# Patient Record
Sex: Female | Born: 1965 | Race: Black or African American | Hispanic: No | Marital: Single | Smoking: Never smoker
Health system: Southern US, Community
[De-identification: ages and names within clinical notes are randomized; demographics above are authoritative.]

## PROBLEM LIST (undated history)

## (undated) DIAGNOSIS — F32A Depression, unspecified: Secondary | ICD-10-CM

## (undated) DIAGNOSIS — I10 Essential (primary) hypertension: Secondary | ICD-10-CM

## (undated) DIAGNOSIS — R450 Nervousness: Secondary | ICD-10-CM

## (undated) DIAGNOSIS — G479 Sleep disorder, unspecified: Secondary | ICD-10-CM

## (undated) DIAGNOSIS — F439 Reaction to severe stress, unspecified: Secondary | ICD-10-CM

## (undated) DIAGNOSIS — D219 Benign neoplasm of connective and other soft tissue, unspecified: Secondary | ICD-10-CM

## (undated) DIAGNOSIS — R682 Dry mouth, unspecified: Secondary | ICD-10-CM

## (undated) DIAGNOSIS — E669 Obesity, unspecified: Secondary | ICD-10-CM

## (undated) DIAGNOSIS — R7303 Prediabetes: Secondary | ICD-10-CM

## (undated) DIAGNOSIS — E876 Hypokalemia: Secondary | ICD-10-CM

## (undated) DIAGNOSIS — F419 Anxiety disorder, unspecified: Secondary | ICD-10-CM

## (undated) DIAGNOSIS — M7989 Other specified soft tissue disorders: Secondary | ICD-10-CM

## (undated) DIAGNOSIS — K829 Disease of gallbladder, unspecified: Secondary | ICD-10-CM

## (undated) DIAGNOSIS — F329 Major depressive disorder, single episode, unspecified: Secondary | ICD-10-CM

## (undated) DIAGNOSIS — L0390 Cellulitis, unspecified: Secondary | ICD-10-CM

## (undated) DIAGNOSIS — E7800 Pure hypercholesterolemia, unspecified: Secondary | ICD-10-CM

## (undated) HISTORY — DX: Essential (primary) hypertension: I10

## (undated) HISTORY — PX: CHOLECYSTECTOMY: SHX55

## (undated) HISTORY — DX: Nervousness: R45.0

## (undated) HISTORY — DX: Obesity, unspecified: E66.9

## (undated) HISTORY — DX: Other specified soft tissue disorders: M79.89

## (undated) HISTORY — DX: Dry mouth, unspecified: R68.2

## (undated) HISTORY — DX: Hypokalemia: E87.6

## (undated) HISTORY — DX: Pure hypercholesterolemia, unspecified: E78.00

## (undated) HISTORY — DX: Sleep disorder, unspecified: G47.9

## (undated) HISTORY — DX: Disease of gallbladder, unspecified: K82.9

## (undated) HISTORY — PX: GANGLION CYST EXCISION: SHX1691

## (undated) HISTORY — DX: Reaction to severe stress, unspecified: F43.9

## (undated) HISTORY — PX: OTHER SURGICAL HISTORY: SHX169

## (undated) HISTORY — PX: WISDOM TOOTH EXTRACTION: SHX21

## (undated) HISTORY — DX: Benign neoplasm of connective and other soft tissue, unspecified: D21.9

---

## 1998-12-17 ENCOUNTER — Other Ambulatory Visit: Admission: RE | Admit: 1998-12-17 | Discharge: 1998-12-17 | Payer: Self-pay | Admitting: Obstetrics and Gynecology

## 2000-10-20 ENCOUNTER — Other Ambulatory Visit: Admission: RE | Admit: 2000-10-20 | Discharge: 2000-10-20 | Payer: Self-pay | Admitting: Obstetrics and Gynecology

## 2001-11-15 ENCOUNTER — Other Ambulatory Visit: Admission: RE | Admit: 2001-11-15 | Discharge: 2001-11-15 | Payer: Self-pay | Admitting: Obstetrics & Gynecology

## 2003-08-08 ENCOUNTER — Other Ambulatory Visit: Admission: RE | Admit: 2003-08-08 | Discharge: 2003-08-08 | Payer: Self-pay | Admitting: Obstetrics and Gynecology

## 2004-09-02 ENCOUNTER — Other Ambulatory Visit: Admission: RE | Admit: 2004-09-02 | Discharge: 2004-09-02 | Payer: Self-pay | Admitting: Obstetrics and Gynecology

## 2005-03-10 DIAGNOSIS — I10 Essential (primary) hypertension: Secondary | ICD-10-CM

## 2005-03-10 DIAGNOSIS — E876 Hypokalemia: Secondary | ICD-10-CM

## 2005-03-10 HISTORY — DX: Hypokalemia: E87.6

## 2005-03-10 HISTORY — DX: Essential (primary) hypertension: I10

## 2005-09-02 ENCOUNTER — Encounter (INDEPENDENT_AMBULATORY_CARE_PROVIDER_SITE_OTHER): Payer: Self-pay | Admitting: *Deleted

## 2005-09-29 ENCOUNTER — Ambulatory Visit: Payer: Self-pay | Admitting: Internal Medicine

## 2005-10-10 ENCOUNTER — Ambulatory Visit: Payer: Self-pay | Admitting: Internal Medicine

## 2005-12-04 ENCOUNTER — Ambulatory Visit: Payer: Self-pay | Admitting: Internal Medicine

## 2006-02-15 ENCOUNTER — Emergency Department (HOSPITAL_COMMUNITY): Admission: EM | Admit: 2006-02-15 | Discharge: 2006-02-16 | Payer: Self-pay | Admitting: Emergency Medicine

## 2006-02-17 ENCOUNTER — Ambulatory Visit: Payer: Self-pay | Admitting: Internal Medicine

## 2006-02-25 ENCOUNTER — Ambulatory Visit: Payer: Self-pay | Admitting: Internal Medicine

## 2007-01-14 ENCOUNTER — Telehealth (INDEPENDENT_AMBULATORY_CARE_PROVIDER_SITE_OTHER): Payer: Self-pay | Admitting: *Deleted

## 2007-02-19 ENCOUNTER — Telehealth (INDEPENDENT_AMBULATORY_CARE_PROVIDER_SITE_OTHER): Payer: Self-pay | Admitting: *Deleted

## 2007-04-29 ENCOUNTER — Encounter (INDEPENDENT_AMBULATORY_CARE_PROVIDER_SITE_OTHER): Payer: Self-pay | Admitting: *Deleted

## 2007-04-29 DIAGNOSIS — E8881 Metabolic syndrome: Secondary | ICD-10-CM | POA: Insufficient documentation

## 2007-04-29 DIAGNOSIS — Z862 Personal history of diseases of the blood and blood-forming organs and certain disorders involving the immune mechanism: Secondary | ICD-10-CM | POA: Insufficient documentation

## 2007-04-29 DIAGNOSIS — Z8639 Personal history of other endocrine, nutritional and metabolic disease: Secondary | ICD-10-CM | POA: Insufficient documentation

## 2007-05-05 ENCOUNTER — Ambulatory Visit: Payer: Self-pay | Admitting: Internal Medicine

## 2007-05-05 DIAGNOSIS — I10 Essential (primary) hypertension: Secondary | ICD-10-CM | POA: Insufficient documentation

## 2007-05-05 DIAGNOSIS — E785 Hyperlipidemia, unspecified: Secondary | ICD-10-CM | POA: Insufficient documentation

## 2007-05-09 LAB — CONVERTED CEMR LAB
Albumin: 3.8 g/dL (ref 3.5–5.2)
BUN: 8 mg/dL (ref 6–23)
Basophils Absolute: 0.1 10*3/uL (ref 0.0–0.1)
Basophils Relative: 0.8 % (ref 0.0–1.0)
CO2: 28 meq/L (ref 19–32)
Chloride: 106 meq/L (ref 96–112)
Cholesterol: 177 mg/dL (ref 0–200)
Creatinine, Ser: 1.2 mg/dL (ref 0.4–1.2)
Creatinine,U: 320.2 mg/dL
HCT: 39 % (ref 36.0–46.0)
HDL: 48.2 mg/dL (ref >39.0)
Hemoglobin: 12.6 g/dL (ref 12.0–15.0)
Lymphocytes Relative: 23 % (ref 12.0–46.0)
Microalb, Ur: 3.1 mg/dL — ABNORMAL HIGH (ref 0.0–1.9)
Monocytes Absolute: 0.7 10*3/uL (ref 0.2–0.7)
Monocytes Relative: 6.8 % (ref 3.0–11.0)
Neutrophils Relative %: 68.5 % (ref 43.0–77.0)
Potassium: 3.6 meq/L (ref 3.5–5.1)
Sodium: 139 meq/L (ref 135–145)
Total Bilirubin: 1 mg/dL (ref 0.3–1.2)
Total Protein: 7 g/dL (ref 6.0–8.3)
VLDL: 12 mg/dL (ref 0–40)

## 2007-05-11 ENCOUNTER — Encounter (INDEPENDENT_AMBULATORY_CARE_PROVIDER_SITE_OTHER): Payer: Self-pay | Admitting: *Deleted

## 2007-05-24 ENCOUNTER — Telehealth (INDEPENDENT_AMBULATORY_CARE_PROVIDER_SITE_OTHER): Payer: Self-pay | Admitting: *Deleted

## 2007-10-04 ENCOUNTER — Ambulatory Visit: Payer: Self-pay | Admitting: Internal Medicine

## 2007-10-04 DIAGNOSIS — Z9189 Other specified personal risk factors, not elsewhere classified: Secondary | ICD-10-CM | POA: Insufficient documentation

## 2007-10-05 ENCOUNTER — Encounter (INDEPENDENT_AMBULATORY_CARE_PROVIDER_SITE_OTHER): Payer: Self-pay | Admitting: *Deleted

## 2007-10-19 ENCOUNTER — Ambulatory Visit: Payer: Self-pay | Admitting: Pulmonary Disease

## 2008-03-15 ENCOUNTER — Telehealth (INDEPENDENT_AMBULATORY_CARE_PROVIDER_SITE_OTHER): Payer: Self-pay | Admitting: *Deleted

## 2008-04-10 ENCOUNTER — Telehealth (INDEPENDENT_AMBULATORY_CARE_PROVIDER_SITE_OTHER): Payer: Self-pay | Admitting: *Deleted

## 2008-06-08 ENCOUNTER — Telehealth (INDEPENDENT_AMBULATORY_CARE_PROVIDER_SITE_OTHER): Payer: Self-pay | Admitting: *Deleted

## 2008-09-28 ENCOUNTER — Ambulatory Visit: Payer: Self-pay | Admitting: Internal Medicine

## 2008-09-28 DIAGNOSIS — R7309 Other abnormal glucose: Secondary | ICD-10-CM | POA: Insufficient documentation

## 2008-09-28 LAB — CONVERTED CEMR LAB
Cholesterol, target level: 200 mg/dL
HDL goal, serum: 50 mg/dL
LDL Goal: 100 mg/dL

## 2008-10-08 LAB — CONVERTED CEMR LAB
Bilirubin, Direct: 0.1 mg/dL (ref 0.0–0.3)
CO2: 26 meq/L (ref 19–32)
Eosinophils Absolute: 0.1 10*3/uL (ref 0.0–0.7)
GFR calc non Af Amer: 69.59 mL/min (ref >60)
HCT: 36.8 % (ref 36.0–46.0)
HDL: 51 mg/dL (ref >39.00)
Hemoglobin: 12.4 g/dL (ref 12.0–15.0)
Hgb A1c MFr Bld: 5.3 % (ref 4.6–6.5)
MCHC: 33.7 g/dL (ref 30.0–36.0)
Monocytes Absolute: 0.6 10*3/uL (ref 0.1–1.0)
Neutrophils Relative %: 71.7 % (ref 43.0–77.0)
Platelets: 237 10*3/uL (ref 150.0–400.0)
RBC: 4.3 M/uL (ref 3.87–5.11)
Sodium: 140 meq/L (ref 135–145)
Total Bilirubin: 1.1 mg/dL (ref 0.3–1.2)
Total CHOL/HDL Ratio: 3
WBC: 8.3 10*3/uL (ref 4.5–10.5)

## 2008-10-10 ENCOUNTER — Encounter (INDEPENDENT_AMBULATORY_CARE_PROVIDER_SITE_OTHER): Payer: Self-pay | Admitting: *Deleted

## 2008-11-28 ENCOUNTER — Ambulatory Visit: Payer: Self-pay | Admitting: Internal Medicine

## 2008-12-04 ENCOUNTER — Encounter (INDEPENDENT_AMBULATORY_CARE_PROVIDER_SITE_OTHER): Payer: Self-pay | Admitting: *Deleted

## 2008-12-04 LAB — CONVERTED CEMR LAB
Albumin: 3.5 g/dL (ref 3.5–5.2)
Alkaline Phosphatase: 85 units/L (ref 39–117)
Bilirubin, Direct: 0.1 mg/dL (ref 0.0–0.3)
Total Bilirubin: 1.1 mg/dL (ref 0.3–1.2)
Total Protein: 6.7 g/dL (ref 6.0–8.3)

## 2008-12-28 ENCOUNTER — Encounter: Admission: RE | Admit: 2008-12-28 | Discharge: 2008-12-28 | Payer: Self-pay | Admitting: Obstetrics and Gynecology

## 2009-10-11 ENCOUNTER — Ambulatory Visit: Payer: Self-pay | Admitting: Internal Medicine

## 2009-10-11 DIAGNOSIS — R61 Generalized hyperhidrosis: Secondary | ICD-10-CM | POA: Insufficient documentation

## 2009-10-22 LAB — CONVERTED CEMR LAB
AST: 16 units/L (ref 0–37)
Alkaline Phosphatase: 73 units/L (ref 39–117)
BUN: 13 mg/dL (ref 6–23)
Basophils Absolute: 0.1 10*3/uL (ref 0.0–0.1)
Chloride: 103 meq/L (ref 96–112)
Creatinine, Ser: 1.1 mg/dL (ref 0.4–1.2)
Eosinophils Absolute: 0.1 10*3/uL (ref 0.0–0.7)
GFR calc non Af Amer: 71.51 mL/min (ref >60)
Glucose, Bld: 78 mg/dL (ref 70–99)
HCT: 36.4 % (ref 36.0–46.0)
Hemoglobin: 12.2 g/dL (ref 12.0–15.0)
Lymphs Abs: 2.2 10*3/uL (ref 0.7–4.0)
MCHC: 33.4 g/dL (ref 30.0–36.0)
MCV: 87.4 fL (ref 78.0–100.0)
Monocytes Absolute: 0.6 10*3/uL (ref 0.1–1.0)
Monocytes Relative: 6.4 % (ref 3.0–12.0)
Neutro Abs: 6.1 10*3/uL (ref 1.4–7.7)
Platelets: 244 10*3/uL (ref 150.0–400.0)
RDW: 13.9 % (ref 11.5–14.6)
TSH: 1.15 microintl units/mL (ref 0.35–5.50)
Total Bilirubin: 0.5 mg/dL (ref 0.3–1.2)

## 2009-12-10 ENCOUNTER — Telehealth: Payer: Self-pay | Admitting: Internal Medicine

## 2010-04-05 ENCOUNTER — Ambulatory Visit
Admission: RE | Admit: 2010-04-05 | Discharge: 2010-04-05 | Payer: Self-pay | Source: Home / Self Care | Attending: Internal Medicine | Admitting: Internal Medicine

## 2010-04-05 DIAGNOSIS — M542 Cervicalgia: Secondary | ICD-10-CM | POA: Insufficient documentation

## 2010-04-09 NOTE — Progress Notes (Signed)
Summary: med concerns  Phone Note Call from Patient   Caller: Patient Summary of Call: Pt states that she recently injured her leg but was told that because she is a on BP med there are certain pain med  that she can not take pls verify which med are ok to take for pain. She would also  like to know if it is ok to take a colon cleanser with her currents meds. Pt needs to know if she needs to take ASA on daily basis.Pls advise...............Marland KitchenFelecia Deloach CMA  December 10, 2009 11:30 AM   Follow-up for Phone Call        I know of no pain meds which would be contraindicated. She should limit NSAIDS. Increased dietary roughage & agents such aas Metamucil are preferable to colon cleansers . A baby coated ASA 81 mg once daily is appropriate.  Additional Follow-up for Phone Call Additional follow up Details #1::        discuss with patient.............Marland KitchenFelecia Deloach CMA  December 10, 2009 3:00 PM     New/Updated Medications: LABETALOL HCL 300 MG  TABS (LABETALOL HCL) 1/2 two times a day & monitor BP

## 2010-04-09 NOTE — Letter (Signed)
Summary: Health Screening/[facility]  Health Screening/[facility]   Imported By: Lanelle Bal 10/22/2009 13:25:04  _____________________________________________________________________  External Attachment:    Type:   Image     Comment:   External Document

## 2010-04-09 NOTE — Assessment & Plan Note (Signed)
Summary: cpx//pt will be fasting//lch   Vital Signs:  Patient profile:   45 year old female Height:      66.75 inches Weight:      219.8 pounds BMI:     34.81 Temp:     98.8 degrees F oral Pulse rate:   60 / minute Resp:     16 per minute BP sitting:   114 / 80  (left arm) Cuff size:   large  Vitals Entered By: Shonna Chock CMA (October 11, 2009 9:50 AM)    History of Present Illness: Cheryl Owens is here for a physical; she has night sweats for 2 years . Menses every 30 days; they have been altered by Mirena IUD. She  has been exercising & on W W  diet  with 20 # weight loss &  loss of 6 inches  @ waist . Fasting labs from [facility] reviewed :TC 170, HDL 47, glucose 84.  Lipid Management History:      Positive NCEP/ATP III risk factors include hypertension.  Negative NCEP/ATP III risk factors include female age less than 13 years old, non-diabetic, no family history for ischemic heart disease, non-tobacco-user status, no ASHD (atherosclerotic heart disease), no prior stroke/TIA, no peripheral vascular disease, and no history of aortic aneurysm.     Current Medications (verified): 1)  Spironolactone 25 Mg Tabs (Spironolactone) .Marland Kitchen.. 1 By Mouth Two Times A Day Fax [phone] 2)  Labetalol Hcl 300 Mg  Tabs (Labetalol Hcl) .... Take One Tablet By Mouth Twice Daily 3)  Merano Iud  Allergies: 1)  ! * Benazepril 2)  ! Hydrochlorothiazide  Past History:  Past Medical History: SNORING, HX OF (ICD-V15.89); no PMH of Sleep Apnea Fasting Hyperglycemia, PMH of (790.29) HYPERLIPIDEMIA (ICD-272.4): Framingham Study LDL goal = < 160. HYPERTENSION, ESSENTIAL NOS (ICD-401.9) METABOLIC SYNDROME X (ICD-277.7) HYPOKALEMIA, PMH of  (ICD-V12.2) Hepatic enzyme elevation (790.4), PMH of  Past Surgical History: Surgically repaired right arm (age 71) post trauma Cholecystectomy G4 P1 A3 ER evaluation 02/2006 : HA, HTN   Family History: Father: Prostate cancer Mother: HTN Siblings: sister: GI  issues MGM:  Thyroid cancer, HTN PGM:  HTN Maternal aunt:  Breast cancer Maternal aunt:  cancer ? primary, HTN 3 Paternal uncles :  Prostate cancer  Social History: Never Smoked [employer] Occupation:Project Manager Alcohol use-no Regular exercise-yes: gym 3X/ week; walks 2X/week 3 mpd  Review of Systems General:  Denies chills, fatigue, and fever. Eyes:  Denies blurring, double vision, and vision loss-both eyes. ENT:  Denies difficulty swallowing and hoarseness. CV:  Denies chest pain or discomfort, leg cramps with exertion, palpitations, shortness of breath with exertion, swelling of feet, and swelling of hands. Resp:  Denies cough, excessive snoring, hypersomnolence, morning headaches, and sputum productive. GI:  Denies abdominal pain, bloody stools, constipation, dark tarry stools, diarrhea, and indigestion. GU:  Denies discharge, dysuria, and hematuria. MS:  Denies joint pain, joint redness, joint swelling, low back pain, mid back pain, and thoracic pain. Derm:  Denies changes in nail beds, dryness, hair loss, and lesion(s). Neuro:  Denies numbness and tingling. Psych:  Denies anxiety and depression. Endo:  Denies cold intolerance, excessive hunger, excessive thirst, excessive urination, and heat intolerance. Heme:  Denies abnormal bruising and bleeding. Allergy:  Denies itching eyes and sneezing.  Physical Exam  General:  well-nourished; alert,appropriate and cooperative throughout examination Head:  Normocephalic and atraumatic without obvious abnormalities.  Eyes:  No corneal or conjunctival inflammation noted. Perrla. Funduscopic exam benign,  without hemorrhages, exudates or papilledema.  Ears:  External ear exam shows no significant lesions or deformities.  Otoscopic examination reveals clear canals, tympanic membranes are intact bilaterally without bulging, retraction, inflammation or discharge. Hearing is grossly normal bilaterally. Nose:  External nasal  examination shows no deformity or inflammation. Nasal mucosa are pink and moist without lesions or exudates. Mouth:  Oral mucosa and oropharynx without lesions or exudates.  Teeth in good repair. Neck:  No deformities, masses, or tenderness noted. Lungs:  Normal respiratory effort, chest expands symmetrically. Lungs are clear to auscultation, no crackles or wheezes. Heart:  regular rhythm, no murmur, no gallop, no rub, no JVD, no HJR, and bradycardia.   Abdomen:  Bowel sounds positive,abdomen soft and non-tender without masses, organomegaly or hernias noted. Genitalia:  Dr Dareen Piano  Msk:  No deformity or scoliosis noted of thoracic or lumbar spine.   Pulses:  R and L carotid,radial,dorsalis pedis and posterior tibial pulses are full and equal bilaterally Extremities:  No clubbing, cyanosis, edema, or deformity noted with normal full range of motion of all joints.  Mild crepitus of knees  Neurologic:  alert & oriented X3 and DTRs symmetrical and normal.   Skin:  Intact without suspicious lesions or rashes Cervical Nodes:  No lymphadenopathy noted Axillary Nodes:  No palpable lymphadenopathy Psych:  memory intact for recent and remote, normally interactive, and good eye contact.     Impression & Recommendations:  Problem # 1:  ROUTINE GENERAL MEDICAL EXAM@HEALTH  CARE FACL (ICD-V70.0)  Orders: EKG w/ Interpretation (93000) Venipuncture (13086) TLB-BMP (Basic Metabolic Panel-BMET) (80048-METABOL) TLB-CBC Platelet - w/Differential (85025-CBCD) TLB-Hepatic/Liver Function Pnl (80076-HEPATIC) TLB-TSH (Thyroid Stimulating Hormone) (84443-TSH)  Problem # 2:  NIGHT SWEATS (ICD-780.8) ? hormonally related  Problem # 3:  HYPERTENSION, ESSENTIAL NOS (ICD-401.9) Controlled Her updated medication list for this problem includes:    Spironolactone 25 Mg Tabs (Spironolactone) .Marland Kitchen... 1 by mouth two times a day fax [phone]    Labetalol Hcl 300 Mg Tabs (Labetalol hcl) .Marland Kitchen..Marland Kitchen Two times a day as  directed  Problem # 4:  OTHER AND UNSPECIFIED HYPERLIPIDEMIA (ICD-272.4) Lipids improved  Complete Medication List: 1)  Spironolactone 25 Mg Tabs (Spironolactone) .Marland Kitchen.. 1 by mouth two times a day fax [phone] 2)  Labetalol Hcl 300 Mg Tabs (Labetalol hcl) .... Two times a day as directed 3)  Merano Iud   Lipid Assessment/Plan:      Based on NCEP/ATP III, the patient's risk factor category is "0-1 risk factors".  The patient's lipid goals are as follows: Total cholesterol goal is 200; LDL cholesterol goal is 100; HDL cholesterol goal is 50; Triglyceride goal is 150.  Her LDL cholesterol goal has not been met.  Secondary causes for hyperlipidemia have been ruled out.  She has been counseled on adjunctive measures for lowering her cholesterol and has been provided with dietary instructions.    Patient Instructions: 1)  Decrease Labetatlol to 1/2 two times a day & monitor BP; your goal = AVERAGE < 135/85.Consume < 30 grams of High Fructose Corn Syrup sugar / day. Prescriptions: SPIRONOLACTONE 25 MG TABS (SPIRONOLACTONE) 1 by mouth two times a day fax [phone]  #180 Each x 3   Entered and Authorized by:   Marga Melnick MD   Signed by:   Marga Melnick MD on 10/11/2009   Method used:   Print then Give to Patient   RxID:   [id number] LABETALOL HCL 300 MG  TABS (LABETALOL HCL) two times a day as directed  #180 x 1  Entered and Authorized by:   Marga Melnick MD   Signed by:   Marga Melnick MD on 10/11/2009   Method used:   Print then Give to Patient   RxID:   [id number]     Appended Document: cpx//pt will be fasting//lch

## 2010-04-11 NOTE — Assessment & Plan Note (Signed)
Summary: POSSIBLE PULLED MUSCLE OR STRAIN OF THE NECK/KB   Vital Signs:  Patient profile:   45 year old female Height:      66.75 inches (169.55 cm) Weight:      231.25 pounds (105.11 kg) BMI:     36.62 Temp:     98.4 degrees F (36.89 degrees C) oral Resp:     15 per minute BP sitting:   120 / 80  (left arm) Cuff size:   regular  Vitals Entered By: Lucious Groves CMA (April 05, 2010 12:34 PM) CC: Possible pulled muscle/strain in the neck (right side only)./kb, Neck pain Is Patient Diabetic? No Pain Assessment Patient in pain? yes     Location: neck Intensity: 8 Type: cramping Onset of pain  last night/this AM Comments Patient notes that she has not injured herself, but did lay on the right side for an extended period of time. Patient notes that at 4 AM she took some Vicodin she had at home and at [facility] took some Ibuprofen. She notes that the Vicodin put her to sleep, but the Ibuprofen actually has helped somewhat.   CC:  Possible pulled muscle/strain in the neck (right side only)./kb and Neck pain.  History of Present Illness: Neck Pain      This is a 45 year old woman who presents with Neck pain as of last night, ? related  to head position while working on computer.  The patient reports right neck pain with  radiation to base of neck.  Associated symptoms include impaired neck ROM.  The patient denies the following associated symptoms: numbness, weakness, impaired coordination, gait disturbance, tingling/parasthesias, fever, bladder dysfunction, bowel dysfunction, locking, and clicking.  The pain is described as intermittent & aching.  The pain is better with NSAIDs & heat. Vicodin didn't help.    Current Medications (verified): 1)  Spironolactone 25 Mg Tabs (Spironolactone) .Marland Kitchen.. 1 By Mouth Two Times A Day Fax [phone] 2)  Labetalol Hcl 300 Mg  Tabs (Labetalol Hcl) .... 1/2 Two Times A Day & Monitor Bp 3)  Merano Iud  Allergies (verified): 1)  ! * Benazepril 2)  !  Hydrochlorothiazide  Physical Exam  General:  well-nourished, uncomfortable but in no acute distress; alert,appropriate and cooperative throughout examination Head:  Normocephalic and atraumatic without obvious abnormalities. No apparent alopecia or balding. Eyes:  No corneal or conjunctival inflammation noted. EOMI. Perrla. Field of  Vision grossly normal. Mouth:  Oral mucosa and oropharynx without lesions or exudates.  Teeth in good repair. No tongue deviation Neck:  No deformities, masses. Slight  tenderness noted R neck muscles. pain with ROM , especially with flexion Pulses:  R and L carotid  pulses are full and equal bilaterallyw/o bruits Extremities:  No clubbing, cyanosis, edema, or deformity noted  Neurologic:  alert & oriented X3, cranial nerves II-XII intact, strength normal in all extremities, sensation intact to light touch,  heel / toe gait normal, and DTRs symmetrical and normal.   Skin:  Intact without suspicious lesions or rashes Cervical Nodes:  No lymphadenopathy noted Axillary Nodes:  No palpable lymphadenopathy Psych:  memory intact for recent and remote, normally interactive, and good eye contact.     Impression & Recommendations:  Problem # 1:  NECK PAIN, ACUTE (ICD-723.1)  Her updated medication list for this problem includes:    Cyclobenzaprine Hcl 5 Mg Tabs (Cyclobenzaprine hcl) .Marland Kitchen... 1 two times a day & 1-2 at bedtime as needed  Complete Medication List: 1)  Spironolactone 25  Mg Tabs (Spironolactone) .Marland Kitchen.. 1 by mouth two times a day fax [phone] 2)  Labetalol Hcl 300 Mg Tabs (Labetalol hcl) .... 1/2 two times a day & monitor bp 3)  Merano Iud  4)  Cyclobenzaprine Hcl 5 Mg Tabs (Cyclobenzaprine hcl) .Marland Kitchen.. 1 two times a day & 1-2 at bedtime as needed  Patient Instructions: 1)  Heating  pad OR Zostrix Cream three times a day as needed to neck. Consider massage ; use a cervical pillow as discussed. 2)  Take 400-600mg  of Ibuprofen (Advil, Motrin) with food every  4-6 hours as needed for relief of pain . Prescriptions: CYCLOBENZAPRINE HCL 5 MG TABS (CYCLOBENZAPRINE HCL) 1 two times a day & 1-2 at bedtime as needed  #20 x 0   Entered and Authorized by:   Marga Melnick MD   Signed by:   Marga Melnick MD on 04/05/2010   Method used:   Electronically to        [facility] (retail)       [address]       [place], [place]  [address]       Ph: [phone]       Fax: [phone]   RxID:   [id number]    Orders Added: 1)  Est. Patient Level III [51884]

## 2010-07-26 NOTE — Assessment & Plan Note (Signed)
[facility]                                 ON-CALL NOTE   KERLY, RIGSBEE                           MRN:          [id number]  DATE:02/15/2006                            DOB:          June 29, 1965    [phone]   Patient of Dr. Alwyn Ren.   The patient calling stating she has hypertension.  Dr. Alwyn Ren has had  her on labetalol with good blood pressure control.  Says tonight her  blood pressure is 190/120, and she has a severe headache.  Advised her  to go to the emergency room for evaluation.     Jeffrey A. Tawanna Cooler, MD  Electronically Signed    JAT/MedQ  DD: 02/15/2006  DT: 02/15/2006  Job #: [id number]

## 2010-07-26 NOTE — Assessment & Plan Note (Signed)
[facility]                                   ON-CALL NOTE   DAILEE, MANALANG                         MRN:          [id number]  DATE:10/09/2005                            DOB:          1965/08/20    PRIMARY CARE PHYSICIAN:  Titus Dubin. Alwyn Ren, MD, FCCP.   Ms. Cheryl Owens is a patient of Dr. Alwyn Ren who states that two weeks ago, she  was diagnosed with hypertension and started on benazepril and  hydrochlorothiazide.  The first day she took benazepril, she had significant  nausea, vomiting, and upset stomach.  She had attributed it to food  poisoning and went to an urgent care, where she was treated with Phenergan  and sent home.  She was advised not to take benazepril until her symptoms  improved.  She started taking benazepril again today around 10:00 this  morning and subsequently had nausea and vomiting and an upset stomach.  She  states that she had Phenergan at home but has had an episode of vomiting  within the last 20 minutes and is afraid that she was not able to keep the  Phenergan down.  She denies any rashes, shortness of breath, or dyspnea on  exertion.   PLAN:  1.  I advised patient to return back to the urgent care that is near her      home for evaluation and treatment.  Recommended she does not restart      benazepril until she is reassessed by Dr. Alwyn Ren.  2.  Advised the patient to call the office tomorrow morning to schedule an      appointment for medication change to treat her hypertension.  3.  Patient expressed understanding.                                   Leanne Chang, MD   LA/MedQ  DD:  10/09/2005  DT:  10/09/2005  Job #:  [id number]

## 2010-08-17 ENCOUNTER — Other Ambulatory Visit: Payer: Self-pay | Admitting: Internal Medicine

## 2010-12-03 ENCOUNTER — Encounter: Payer: Self-pay | Admitting: Internal Medicine

## 2010-12-03 ENCOUNTER — Ambulatory Visit (INDEPENDENT_AMBULATORY_CARE_PROVIDER_SITE_OTHER): Payer: Self-pay | Admitting: Internal Medicine

## 2010-12-03 VITALS — BP 126/88 | HR 67 | Temp 98.6°F | Resp 12 | Ht 67.0 in | Wt 230.0 lb

## 2010-12-03 DIAGNOSIS — I10 Essential (primary) hypertension: Secondary | ICD-10-CM

## 2010-12-03 DIAGNOSIS — E8881 Metabolic syndrome: Secondary | ICD-10-CM

## 2010-12-03 DIAGNOSIS — E785 Hyperlipidemia, unspecified: Secondary | ICD-10-CM

## 2010-12-03 DIAGNOSIS — Z0000 Encounter for general adult medical examination without abnormal findings: Secondary | ICD-10-CM

## 2010-12-03 DIAGNOSIS — Z136 Encounter for screening for cardiovascular disorders: Secondary | ICD-10-CM

## 2010-12-03 DIAGNOSIS — R7309 Other abnormal glucose: Secondary | ICD-10-CM

## 2010-12-03 DIAGNOSIS — Z9189 Other specified personal risk factors, not elsewhere classified: Secondary | ICD-10-CM

## 2010-12-03 LAB — CBC WITH DIFFERENTIAL/PLATELET
Basophils Absolute: 0 10*3/uL (ref 0.0–0.1)
HCT: 40 % (ref 36.0–46.0)
Hemoglobin: 12.9 g/dL (ref 12.0–15.0)
Lymphs Abs: 1.6 10*3/uL (ref 0.7–4.0)
MCV: 87.3 fl (ref 78.0–100.0)
Monocytes Absolute: 0.5 10*3/uL (ref 0.1–1.0)
Monocytes Relative: 6.8 % (ref 3.0–12.0)
Neutro Abs: 4.6 10*3/uL (ref 1.4–7.7)
Platelets: 242 10*3/uL (ref 150.0–400.0)
RDW: 14.2 % (ref 11.5–14.6)

## 2010-12-03 LAB — BASIC METABOLIC PANEL
Calcium: 9.1 mg/dL (ref 8.4–10.5)
Chloride: 108 mEq/L (ref 96–112)
Creatinine, Ser: 1.1 mg/dL (ref 0.4–1.2)
GFR: 70.38 mL/min (ref >60.00)

## 2010-12-03 LAB — LIPID PANEL
Cholesterol: 179 mg/dL (ref 0–200)
LDL Cholesterol: 115 mg/dL — ABNORMAL HIGH (ref 0–99)
Total CHOL/HDL Ratio: 4
VLDL: 14.2 mg/dL (ref 0.0–40.0)

## 2010-12-03 LAB — HEPATIC FUNCTION PANEL
ALT: 16 U/L (ref 0–35)
AST: 18 U/L (ref 0–37)
Bilirubin, Direct: 0 mg/dL (ref 0.0–0.3)
Total Bilirubin: 0.7 mg/dL (ref 0.3–1.2)

## 2010-12-03 LAB — TSH: TSH: 0.42 u[IU]/mL (ref 0.35–5.50)

## 2010-12-03 MED ORDER — LABETALOL HCL 300 MG PO TABS: 300.0000 mg | ORAL_TABLET | ORAL | Status: DC

## 2010-12-03 NOTE — Patient Instructions (Signed)
Preventive Health Care: Exercise  30-45  minutes a day, 3-4 days a week. Walking is especially valuable in preventing Osteoporosis. Eat a low-fat diet with lots of fruits and vegetables, up to 7-9 servings per day. Avoid obesity; your goal = waist less than 35 inches.Consume less than 30 grams of sugar per day from foods & drinks with High Fructose Corn Syrup as #1,2,3 or #4 on label. Health Care Power of Attorney & Living Will place you in charge of your health care  decisions. Verify these are  in place. Your BP goal = AVERAGE < 135/85. Avoid ingestion of  excess salt/sodium.Cook with pepper & other spices . Use the salt substitute "No Salt"(unless your potassium has been elevated) OR the Mrs Sharilyn Sites products to season food @ the table. Avoid foods which taste salty or "vinegary" as their sodium contentet will be high.

## 2010-12-03 NOTE — Progress Notes (Signed)
Subjective:    Patient ID: Cheryl Owens, female    DOB: May 03, 1965, 45 y.o.   MRN: [id number]  HPI Cheryl Owens    is here for a physical; she denies acute issues .      Review of Systems HYPERTENSION: Disease Monitoring: Blood pressure range-not monitored  Chest pain, palpitations- no       Dyspnea- no Medications: Compliance- yes  Lightheadedness,Syncope- no    Edema- occasionally, better with increased water intake  METABOLIC SYNDROME: Disease Monitoring: Blood Sugar ranges-not checked  Polyuria/phagia/dipsia- no       Visual problems- no; last Ophth exam 12/11: negative Medications: Compliance- not on meds   HYPERLIPIDEMIA:  Disease Monitoring: See symptoms for Hypertension Medications: Compliance- not on statin     ROS:Abd pain, bowel changes- no   Muscle aches- no  Smoking Status :never        Objective:   Physical Exam Gen.: Healthy and well-nourished in appearance. Alert, appropriate and cooperative throughout exam. Head: Normocephalic without obvious abnormalities Eyes: No corneal or conjunctival inflammation noted. Pupils equal round reactive to light and accommodation. Fundal exam is benign without hemorrhages, exudate, papilledema. Extraocular motion intact. Vision grossly normal. Ears: External  ear exam reveals no significant lesions or deformities. Canals clear .TMs normal. Hearing is grossly normal bilaterally. Nose: External nasal exam reveals no deformity or inflammation. Nasal mucosa are pink and moist. No lesions or exudates noted.  Mouth: Oral mucosa and oropharynx reveal no lesions or exudates. Teeth in good repair. Neck: No deformities, masses, or tenderness noted. Range of motion & . Thyroid  normal. Lungs: Normal respiratory effort; chest expands symmetrically. Lungs are clear to auscultation without rales, wheezes, or increased work of breathing. Heart: Normal rate and rhythm. Normal S1 and S2. No gallop, click, or rub. S4 with slight  slurring; no murmur. Abdomen: Bowel sounds normal; abdomen soft and nontender. No masses, organomegaly or hernias noted. Genitalia: Dr Malva Limes   .                                                                                   Musculoskeletal/extremities: No deformity or scoliosis noted of  the thoracic or lumbar spine. No clubbing, cyanosis, edema, or deformity noted. Range of motion  normal .Tone & strength  normal.Joints normal. Nail health  good. Vascular: Carotid, radial artery, dorsalis pedis and  posterior tibial pulses are full and equal. No bruits present. Neurologic: Alert and oriented x3. Deep tendon reflexes symmetrical and normal.          Skin: Intact without suspicious lesions or rashes. Op scar with keloid RUE Lymph: No cervical, axillary  lymphadenopathy present. Psych: Mood and affect are normal. Normally interactive  Assessment & Plan:  #1 comprehensive physical exam; no acute findings #2 see Problem List with Assessments & Recommendations Plan: see Orders

## 2011-01-12 ENCOUNTER — Other Ambulatory Visit: Payer: Self-pay | Admitting: Internal Medicine

## 2011-01-31 ENCOUNTER — Other Ambulatory Visit: Payer: Self-pay | Admitting: Internal Medicine

## 2011-01-31 DIAGNOSIS — I10 Essential (primary) hypertension: Secondary | ICD-10-CM

## 2011-01-31 MED ORDER — LABETALOL HCL 300 MG PO TABS: 300.0000 mg | ORAL_TABLET | ORAL | Status: DC

## 2011-01-31 NOTE — Telephone Encounter (Signed)
RX sent

## 2011-03-05 ENCOUNTER — Telehealth: Payer: Self-pay | Admitting: Internal Medicine

## 2011-03-05 DIAGNOSIS — I10 Essential (primary) hypertension: Secondary | ICD-10-CM

## 2011-03-06 MED ORDER — LABETALOL HCL 300 MG PO TABS: 300.0000 mg | ORAL_TABLET | ORAL | Status: DC

## 2011-03-06 NOTE — Telephone Encounter (Signed)
refill 

## 2011-06-18 ENCOUNTER — Telehealth: Payer: Self-pay | Admitting: Internal Medicine

## 2011-06-18 DIAGNOSIS — E038 Other specified hypothyroidism: Secondary | ICD-10-CM

## 2011-06-18 DIAGNOSIS — E039 Hypothyroidism, unspecified: Secondary | ICD-10-CM

## 2011-06-18 NOTE — Telephone Encounter (Signed)
Order placed

## 2011-06-18 NOTE — Telephone Encounter (Signed)
Patient states she is due for lab work. I have scheduled her for labs here Friday at 815am. Her last letter states she needed to check her TSH every 31-months. Can you please put in a lab order Thanks

## 2011-06-20 ENCOUNTER — Other Ambulatory Visit (INDEPENDENT_AMBULATORY_CARE_PROVIDER_SITE_OTHER): Payer: BC Managed Care – PPO

## 2011-06-20 DIAGNOSIS — E039 Hypothyroidism, unspecified: Secondary | ICD-10-CM

## 2011-07-10 ENCOUNTER — Other Ambulatory Visit: Payer: Self-pay | Admitting: Internal Medicine

## 2011-07-10 DIAGNOSIS — I10 Essential (primary) hypertension: Secondary | ICD-10-CM

## 2011-07-10 MED ORDER — LABETALOL HCL 300 MG PO TABS: 300.0000 mg | ORAL_TABLET | ORAL | Status: DC

## 2011-07-10 NOTE — Telephone Encounter (Signed)
Refill for Labetalol HCL 300MG  Tablet Qty 90 Take 1/2 tablet twice daily as directed Last filled 12.27.2012  Last OV 04.12.13

## 2011-07-10 NOTE — Telephone Encounter (Signed)
RX sent

## 2011-12-24 ENCOUNTER — Other Ambulatory Visit: Payer: Self-pay | Admitting: Internal Medicine

## 2011-12-24 DIAGNOSIS — I10 Essential (primary) hypertension: Secondary | ICD-10-CM

## 2011-12-24 MED ORDER — SPIRONOLACTONE 25 MG PO TABS: ORAL_TABLET | ORAL | Status: DC

## 2011-12-24 MED ORDER — LABETALOL HCL 300 MG PO TABS: 300.0000 mg | ORAL_TABLET | ORAL | Status: DC

## 2011-12-24 NOTE — Telephone Encounter (Signed)
Refills x 2 last ov 9.25.12 V70---CPE scheduled for 11.15.13   1-Labetalol 300mg  tab #90 Take 1-tablet by mouth twice daily --last fill 8.14.13  2-Spironolact 25MG  Tab #180Take 1-tablet by mouth twice daily --last fill 7.29.13

## 2011-12-24 NOTE — Telephone Encounter (Signed)
Rx's sent in. °

## 2012-01-23 ENCOUNTER — Ambulatory Visit (INDEPENDENT_AMBULATORY_CARE_PROVIDER_SITE_OTHER): Payer: BC Managed Care – PPO | Admitting: Internal Medicine

## 2012-01-23 ENCOUNTER — Encounter: Payer: Self-pay | Admitting: Internal Medicine

## 2012-01-23 VITALS — BP 132/88 | HR 73 | Temp 97.8°F | Resp 12 | Ht 67.0 in | Wt 248.2 lb

## 2012-01-23 DIAGNOSIS — I10 Essential (primary) hypertension: Secondary | ICD-10-CM

## 2012-01-23 DIAGNOSIS — Z0000 Encounter for general adult medical examination without abnormal findings: Secondary | ICD-10-CM

## 2012-01-23 LAB — BASIC METABOLIC PANEL
CO2: 24 mEq/L (ref 19–32)
Calcium: 9 mg/dL (ref 8.4–10.5)
Chloride: 109 mEq/L (ref 96–112)
GFR: 70.03 mL/min (ref >60.00)
Potassium: 4 mEq/L (ref 3.5–5.1)
Sodium: 138 mEq/L (ref 135–145)

## 2012-01-23 MED ORDER — SPIRONOLACTONE 25 MG PO TABS: ORAL_TABLET | ORAL | Status: DC

## 2012-01-23 MED ORDER — LABETALOL HCL 300 MG PO TABS: ORAL_TABLET | ORAL | Status: DC

## 2012-01-23 NOTE — Patient Instructions (Addendum)
Preventive Health Care: Exercise  30-45  minutes a day, 3-4 days a week. Walking is especially valuable in preventing Osteoporosis.  Eat a low-fat diet with lots of fruits and vegetables, up to 7-9 servings per day. Consume less than 30 ( preferably ZERO) grams of sugar per day from foods & drinks with High Fructose Corn Syrup (HFCS) sugar as #1,2,3 or # 4 on label.Whole Foods, [employer] & [employer] do not carry products with HFCS. Follow a  low carb nutrition program such as [place] or The [employer] Sugar [employer]  to prevent Diabetes . White carbohydrates (potatoes, rice, bread, and pasta) have a high spike of sugar and a high load of sugar. For example a  baked potato has a cup of sugar and a  french fry  2 teaspoons of sugar. Yams, wild  rice, whole grained bread &  wheat pasta have been much lower spike and load of  sugar. Portions should be the size of a deck of cards or your palm.  Health Care Power of Attorney & Living Will place you in charge of your health care  decisions. Verify these are  in place. To prevent palpitations or premature beats, avoid stimulants such as decongestants, diet pills, nicotine, or caffeine (coffee, tea, cola, or chocolate) to excess.  If you activate My Chart; the results can be released to you as soon as they populate from the lab. If you choose not to use this program; the labs have to be reviewed, copied & mailed   causing a delay in getting the results to you.

## 2012-01-23 NOTE — Progress Notes (Signed)
  Subjective:    Patient ID: Cheryl Owens, female    DOB: [birth year], 46 y.o.   MRN: [id number]  HPI  She is  here for a physical; she denies acute issues.         Review of Systems  HYPERTENSION: Disease Monitoring: Blood pressure range-no record  Chest pain, palpitations- no       Dyspnea- no Medications: Compliance- yes  Lightheadedness,Syncope- no    Edema- minor  FASTING HYPERGLYCEMIA, PMH of:  Disease Monitoring: Fasting glucose 81 on 11/04/11 Blood Sugar ranges-no monitor  Polyuria/phagia/dipsia- no      Visual problems- no  HYPERLIPIDEMIA: Disease Monitoring: Labs done 11/04/11 at her job site were reviewed: HDL 41; total cholesterol 528. No recording of triglycerides her LDL See symptoms for Hypertension Medications: Compliance-no statin; on "convenient diet" Abd pain, bowel changes- some constipation  Muscle aches- no          Objective:   Physical Exam Gen.: Healthy and well-nourished in appearance. Alert, appropriate and cooperative throughout exam. Head: Normocephalic without obvious abnormalities  Eyes: No corneal or conjunctival inflammation noted. Pupils equal round reactive to light and accommodation. Fundal exam is benign without hemorrhages, exudate, papilledema. Extraocular motion intact. Vision grossly normal. Ears: External  ear exam reveals no significant lesions or deformities. Canals clear .TMs normal. Hearing is grossly normal bilaterally. Nose: External nasal exam reveals no deformity or inflammation. Nasal mucosa are pink and moist. No lesions or exudates noted.   Mouth: Oral mucosa and oropharynx reveal no lesions or exudates. Teeth in good repair. Neck: No deformities, masses, or tenderness noted. Range of motion & Thyroid normal. Lungs: Normal respiratory effort; chest expands symmetrically. Lungs are clear to auscultation without rales, wheezes, or increased work of breathing. Heart: Normal rate and rhythm. Normal S1 and S2. No gallop,  click, or rub. S4 murmur. Abdomen: Bowel sounds normal; abdomen soft and nontender. No masses, organomegaly or hernias noted. Genitalia: Dr Dareen Piano, Gyn                                                    Musculoskeletal/extremities: No deformity or scoliosis noted of  the thoracic or lumbar spine. No clubbing, cyanosis, edema, or deformity noted. Range of motion  normal .Tone & strength  normal.Joints normal. Nail health  good. Vascular: Carotid, radial artery, dorsalis pedis and  posterior tibial pulses are full and equal. No bruits present. Neurologic: Alert and oriented x3. Deep tendon reflexes symmetrical and normal.          Skin: Intact without suspicious lesions or rashes. Lymph: No cervical, axillary lymphadenopathy present. Psych: Mood and affect are normal. Normally interactive                                                                                         Assessment & Plan:  #1 comprehensive physical exam; no acute findings  Plan: see Orders

## 2012-07-27 ENCOUNTER — Encounter: Payer: Self-pay | Admitting: Family Medicine

## 2012-07-27 ENCOUNTER — Ambulatory Visit (INDEPENDENT_AMBULATORY_CARE_PROVIDER_SITE_OTHER): Payer: BC Managed Care – PPO | Admitting: Family Medicine

## 2012-07-27 VITALS — BP 130/66 | HR 86 | Temp 99.0°F | Wt 247.6 lb

## 2012-07-27 DIAGNOSIS — H60392 Other infective otitis externa, left ear: Secondary | ICD-10-CM

## 2012-07-27 DIAGNOSIS — H60399 Other infective otitis externa, unspecified ear: Secondary | ICD-10-CM

## 2012-07-27 DIAGNOSIS — H6120 Impacted cerumen, unspecified ear: Secondary | ICD-10-CM

## 2012-07-27 DIAGNOSIS — H6122 Impacted cerumen, left ear: Secondary | ICD-10-CM

## 2012-07-27 MED ORDER — OFLOXACIN 0.3 % OT SOLN: 10.0000 [drp] | Freq: Every day | OTIC | Status: DC

## 2012-07-27 NOTE — Assessment & Plan Note (Signed)
floxin gtts  

## 2012-07-27 NOTE — Assessment & Plan Note (Signed)
Irrigated successfully  

## 2012-07-27 NOTE — Progress Notes (Signed)
  Subjective:    Patient ID: Cheryl Owens, female    DOB: 1965-11-02, 47 y.o.   MRN: [id number]  HPI Pt here c/o ears feeling full.  No congestion, fever etc.  Pt has used otc drops.     Review of Systems As above    Objective:   Physical Exam  BP 130/66  Pulse 86  Temp(Src) 99 F (37.2 C) (Oral)  Wt 247 lb 9.6 oz (112.311 kg)  BMI 38.77 kg/m2  SpO2 98% General appearance: alert, cooperative, appears stated age and no distress Ears: Lear-- + cerumen impaction-- irrigated successfully,  canal errythematous Throat: lips, mucosa, and tongue normal; teeth and gums normal Neck: no adenopathy, no carotid bruit, no JVD, supple, symmetrical, trachea midline and thyroid not enlarged, symmetric, no tenderness/mass/nodules Lungs: clear to auscultation bilaterally       Assessment & Plan:

## 2012-07-27 NOTE — Patient Instructions (Signed)
Otitis Externa Otitis externa is a bacterial or fungal infection of the outer ear canal. This is the area from the eardrum to the outside of the ear. Otitis externa is sometimes called "swimmer's ear." CAUSES  Possible causes of infection include:  Swimming in dirty water.  Moisture remaining in the ear after swimming or bathing.  Mild injury (trauma) to the ear.  Objects stuck in the ear (foreign body).  Cuts or scrapes (abrasions) on the outside of the ear. SYMPTOMS  The first symptom of infection is often itching in the ear canal. Later signs and symptoms may include swelling and redness of the ear canal, ear pain, and yellowish-white fluid (pus) coming from the ear. The ear pain may be worse when pulling on the earlobe. DIAGNOSIS  Your caregiver will perform a physical exam. A sample of fluid may be taken from the ear and examined for bacteria or fungi. TREATMENT  Antibiotic ear drops are often given for 10 to 14 days. Treatment may also include pain medicine or corticosteroids to reduce itching and swelling. PREVENTION   Keep your ear dry. Use the corner of a towel to absorb water out of the ear canal after swimming or bathing.  Avoid scratching or putting objects inside your ear. This can damage the ear canal or remove the protective wax that lines the canal. This makes it easier for bacteria and fungi to grow.  Avoid swimming in lakes, polluted water, or poorly chlorinated pools.  You may use ear drops made of rubbing alcohol and vinegar after swimming. Combine equal parts of white vinegar and alcohol in a bottle. Put 3 or 4 drops into each ear after swimming. HOME CARE INSTRUCTIONS   Apply antibiotic ear drops to the ear canal as prescribed by your caregiver.  Only take over-the-counter or prescription medicines for pain, discomfort, or fever as directed by your caregiver.  If you have diabetes, follow any additional treatment instructions from your caregiver.  Keep all  follow-up appointments as directed by your caregiver. SEEK MEDICAL CARE IF:   You have a fever.  Your ear is still red, swollen, painful, or draining pus after 3 days.  Your redness, swelling, or pain gets worse.  You have a severe headache.  You have redness, swelling, pain, or tenderness in the area behind your ear. MAKE SURE YOU:   Understand these instructions.  Will watch your condition.  Will get help right away if you are not doing well or get worse. Document Released: 02/24/2005 Document Revised: 05/19/2011 Document Reviewed: 03/13/2011 [employer] Patient Information 2013 [employer].  

## 2012-08-25 ENCOUNTER — Other Ambulatory Visit: Payer: Self-pay

## 2012-08-25 DIAGNOSIS — I10 Essential (primary) hypertension: Secondary | ICD-10-CM

## 2012-08-25 MED ORDER — SPIRONOLACTONE 25 MG PO TABS: ORAL_TABLET | ORAL | Status: DC

## 2012-08-25 MED ORDER — LABETALOL HCL 300 MG PO TABS: ORAL_TABLET | ORAL | Status: DC

## 2012-08-25 NOTE — Telephone Encounter (Signed)
I spoke with patient to confirm that she requested rx's from [facility] for we have not filled rx's with them before (for her), patient indicated this is her first time using them and she did request rx's from [facility]

## 2012-12-13 ENCOUNTER — Encounter: Payer: Self-pay | Admitting: Internal Medicine

## 2012-12-13 ENCOUNTER — Ambulatory Visit (INDEPENDENT_AMBULATORY_CARE_PROVIDER_SITE_OTHER): Payer: BC Managed Care – PPO | Admitting: Internal Medicine

## 2012-12-13 VITALS — BP 133/76 | HR 73 | Temp 98.5°F | Wt 254.6 lb

## 2012-12-13 DIAGNOSIS — M25469 Effusion, unspecified knee: Secondary | ICD-10-CM

## 2012-12-13 DIAGNOSIS — S8992XA Unspecified injury of left lower leg, initial encounter: Secondary | ICD-10-CM

## 2012-12-13 DIAGNOSIS — M25462 Effusion, left knee: Secondary | ICD-10-CM

## 2012-12-13 DIAGNOSIS — S99919A Unspecified injury of unspecified ankle, initial encounter: Secondary | ICD-10-CM

## 2012-12-13 DIAGNOSIS — S8990XA Unspecified injury of unspecified lower leg, initial encounter: Secondary | ICD-10-CM

## 2012-12-13 MED ORDER — CELECOXIB 200 MG PO CAPS: 200.0000 mg | ORAL_CAPSULE | Freq: Two times a day (BID) | ORAL | Status: DC

## 2012-12-13 NOTE — Patient Instructions (Addendum)
Use an anti-inflammatory cream such as Aspercreme or Zostrix cream twice a day to the affected area as needed. In lieu of this warm moist compresses or  hot water bottle can be used. Do not apply ice . 

## 2012-12-13 NOTE — Progress Notes (Signed)
  Subjective:    Patient ID: Cheryl Owens, female    DOB: 15-Feb-1966, 47 y.o.   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  She took Aleve over a three-day period with some improvement.  The symptoms are now more localized over the top the knee and worse with changing positions such as getting to bed or putting pressure on it.  She has no history of significant arthritis or musculoskeletal processes.    Review of Systems   She denies associated fever, chills or sweats. There's been no rash or change in color or temperature an area pain.  She also denies any associated numbness, tingling, weakness in the left lower extremity.    Objective:   Physical Exam Gen.:  well-nourished in appearance. Alert, appropriate and cooperative throughout exam.  Neck: No deformities, masses, or tenderness noted. Range of motion normal Lungs: Normal respiratory effort; chest expands symmetrically. Lungs are clear to auscultation without rales, wheezes, or increased work of breathing.                             Musculoskeletal/extremities:  Accentuated curvature of upper thoracic  Spine. No clubbing, cyanosis, edema, or significant extremity  deformity noted. L patella ballotable.Range of motion decreased @ L knee due to effusion & pain with flexion .Tone & strength  Normal. Joints  Otherwise normal . Nail health good. Able to lie down & sit up w/o help. Negative SLR bilaterally Vascular:  dorsalis pedis and  posterior tibial pulses are full and equal. No bruits present. Neurologic: Alert and oriented x3. Deep tendon reflexes symmetrical and normal.       Skin: Intact without suspicious lesions or rashes.  Psych: Mood and affect are normal. Normally interactive                                                                                         Assessment & Plan:  #1 left knee acute injury with associated effusion  Plan: See orders

## 2012-12-16 ENCOUNTER — Telehealth: Payer: Self-pay | Admitting: *Deleted

## 2012-12-16 NOTE — Telephone Encounter (Signed)
Patient called requesting additional samples of Celebrex. She has an appt with ortho on Monday and would like the samples to get her through the weekend. Please advise.

## 2012-12-16 NOTE — Telephone Encounter (Signed)
Samples placed up front for patient. Patient contacted

## 2012-12-16 NOTE — Telephone Encounter (Signed)
OK 

## 2013-01-13 ENCOUNTER — Other Ambulatory Visit: Payer: Self-pay

## 2013-01-27 ENCOUNTER — Encounter: Payer: BC Managed Care – PPO | Admitting: Internal Medicine

## 2013-02-02 ENCOUNTER — Other Ambulatory Visit: Payer: Self-pay | Admitting: Obstetrics and Gynecology

## 2013-02-02 DIAGNOSIS — N6310 Unspecified lump in the right breast, unspecified quadrant: Secondary | ICD-10-CM

## 2013-02-10 ENCOUNTER — Ambulatory Visit
Admission: RE | Admit: 2013-02-10 | Discharge: 2013-02-10 | Disposition: A | Discharge status: Home / Self Care | Payer: BC Managed Care – PPO | Source: Ambulatory Visit | Attending: Obstetrics and Gynecology | Admitting: Obstetrics and Gynecology

## 2013-02-10 DIAGNOSIS — N6310 Unspecified lump in the right breast, unspecified quadrant: Secondary | ICD-10-CM

## 2013-02-10 LAB — HM MAMMOGRAPHY: HM Mammogram: NEGATIVE

## 2013-02-15 ENCOUNTER — Ambulatory Visit (INDEPENDENT_AMBULATORY_CARE_PROVIDER_SITE_OTHER): Payer: BC Managed Care – PPO | Admitting: Internal Medicine

## 2013-02-15 ENCOUNTER — Encounter: Payer: Self-pay | Admitting: Internal Medicine

## 2013-02-15 VITALS — BP 113/77 | HR 66 | Temp 98.4°F | Ht 67.0 in | Wt 252.6 lb

## 2013-02-15 DIAGNOSIS — E669 Obesity, unspecified: Secondary | ICD-10-CM | POA: Insufficient documentation

## 2013-02-15 DIAGNOSIS — Z0000 Encounter for general adult medical examination without abnormal findings: Secondary | ICD-10-CM

## 2013-02-15 DIAGNOSIS — R7309 Other abnormal glucose: Secondary | ICD-10-CM

## 2013-02-15 DIAGNOSIS — E785 Hyperlipidemia, unspecified: Secondary | ICD-10-CM

## 2013-02-15 DIAGNOSIS — I10 Essential (primary) hypertension: Secondary | ICD-10-CM

## 2013-02-15 LAB — CBC WITH DIFFERENTIAL/PLATELET
Basophils Absolute: 0 10*3/uL (ref 0.0–0.1)
Eosinophils Absolute: 0.1 10*3/uL (ref 0.0–0.7)
Eosinophils Relative: 1 % (ref 0.0–5.0)
HCT: 38.1 % (ref 36.0–46.0)
Hemoglobin: 12.4 g/dL (ref 12.0–15.0)
Lymphs Abs: 1.9 10*3/uL (ref 0.7–4.0)
MCHC: 32.4 g/dL (ref 30.0–36.0)
MCV: 85.3 fl (ref 78.0–100.0)
Monocytes Absolute: 0.5 10*3/uL (ref 0.1–1.0)
Monocytes Relative: 5.2 % (ref 3.0–12.0)
Neutro Abs: 6.2 10*3/uL (ref 1.4–7.7)
Platelets: 258 10*3/uL (ref 150.0–400.0)
WBC: 8.7 10*3/uL (ref 4.5–10.5)

## 2013-02-15 LAB — BASIC METABOLIC PANEL
BUN: 11 mg/dL (ref 6–23)
CO2: 22 mEq/L (ref 19–32)
Chloride: 105 mEq/L (ref 96–112)
GFR: 64.19 mL/min (ref >60.00)
Glucose, Bld: 87 mg/dL (ref 70–99)
Potassium: 3.7 mEq/L (ref 3.5–5.1)
Sodium: 134 mEq/L — ABNORMAL LOW (ref 135–145)

## 2013-02-15 LAB — LIPID PANEL
Cholesterol: 180 mg/dL (ref 0–200)
LDL Cholesterol: 124 mg/dL — ABNORMAL HIGH (ref 0–99)
Triglycerides: 58 mg/dL (ref 0.0–149.0)

## 2013-02-15 LAB — HEPATIC FUNCTION PANEL
ALT: 18 U/L (ref 0–35)
AST: 17 U/L (ref 0–37)
Albumin: 3.8 g/dL (ref 3.5–5.2)
Alkaline Phosphatase: 82 U/L (ref 39–117)
Total Bilirubin: 0.7 mg/dL (ref 0.3–1.2)
Total Protein: 7.1 g/dL (ref 6.0–8.3)

## 2013-02-15 LAB — TSH: TSH: 0.86 u[IU]/mL (ref 0.35–5.50)

## 2013-02-15 MED ORDER — LABETALOL HCL 300 MG PO TABS: ORAL_TABLET | ORAL | Status: DC

## 2013-02-15 MED ORDER — SPIRONOLACTONE 25 MG PO TABS: ORAL_TABLET | ORAL | Status: DC

## 2013-02-15 NOTE — Patient Instructions (Addendum)
Your next office appointment will be determined based upon review of your pending labs . Those instructions will be transmitted to you through My Chart . Follow the low carb nutrition program in [employer] as closely as possible to prevent Diabetes progression & complications. White carbohydrates (potatoes, rice, bread, and pasta) have a high spike of sugar and a high load of sugar. For example a  baked potato has a cup of sugar and a  french fry  2 teaspoons of sugar. Yams, wild  rice, whole grained bread &  wheat pasta have been much lower spike and load of  Sugar. Cardiovascular exercise, this can be as simple a program as walking, is recommended 30-45 minutes 3-4 times per week. If you're not exercising you should take 6-8 weeks to build up to this level.

## 2013-02-15 NOTE — Progress Notes (Signed)
   Subjective:    Patient ID: Cheryl Owens, female    DOB: 1965/03/25, 47 y.o.   MRN: [id number]  HPI  She is here for a physical;acute issues denied.     Review of Systems  Heart healthy /sodium restriction diets are not followed;no  exercise program due to knee issues.  Family history is negative for premature coronary disease. Advanced cholesterol testing reveals  LDL goal is less than 100 ; ideally < 80 . Low dose ASA not taken Specifically denied are  chest pain, palpitations, dyspnea, or claudication.  BP @ work "normal" , checked by Engineer, civil (consulting).  Labs were performed at our office 10/12/12; HDL 53, total cholesterol 914, and glucose 86. Screening was performed 02/09/13 on the carotid arteries and thyroid apparently with ultrasound. These were negative.      Objective:   Physical Exam Gen.: Healthy and well-nourished in appearance. Alert, appropriate and cooperative throughout exam.Appears younger than stated age  Head: Normocephalic without obvious abnormalities  Eyes: No corneal or conjunctival inflammation noted. Pupils equal round reactive to light and accommodation. Extraocular motion intact.  Ears: External  ear exam reveals no significant lesions or deformities. Canals clear .TMs normal. Hearing is grossly normal bilaterally. Nose: External nasal exam reveals no deformity or inflammation. Nasal mucosa are pink and moist. No lesions or exudates noted.   Mouth: Oral mucosa and oropharynx reveal no lesions or exudates. Teeth in good repair. Neck: No deformities, masses, or tenderness noted. Range of motion decreased. Thyroid normal. Lungs: Normal respiratory effort; chest expands symmetrically. Lungs are clear to auscultation without rales, wheezes, or increased work of breathing. Heart: Normal rate and rhythm. Normal S1 and S2. No gallop, click, or rub. S4 w/o murmur. Abdomen: Bowel sounds normal; abdomen soft and nontender. No masses, organomegaly or hernias noted. Genitalia:  as per  Gyn                                  Musculoskeletal/extremities: No deformity or scoliosis noted of  the thoracic or lumbar spine.  No clubbing, cyanosis, edema, or significant extremity  deformity noted. Range of motion normal .Tone & strength normal. Hand joints normal . Fingernail  health good. Able to lie down & sit up w/o help. Negative SLR bilaterally Vascular: Carotid, radial artery, dorsalis pedis and  posterior tibial pulses are full and equal. No bruits present. Neurologic: Alert and oriented x3. Deep tendon reflexes symmetrical and normal.      Skin: Intact without suspicious lesions or rashes. Lymph: No cervical, axillary lymphadenopathy present. Psych: Mood and affect are normal. Normally interactive                                                                                        Assessment & Plan:  #1 comprehensive physical exam; no acute findings  Plan: see Orders  & Recommendations

## 2013-02-15 NOTE — Progress Notes (Signed)
Pre visit review using our clinic review tool, if applicable. No additional management support is needed unless otherwise documented below in the visit note. 

## 2013-02-17 ENCOUNTER — Other Ambulatory Visit: Payer: BC Managed Care – PPO

## 2013-04-18 ENCOUNTER — Other Ambulatory Visit: Payer: Self-pay | Admitting: Internal Medicine

## 2013-04-19 NOTE — Telephone Encounter (Signed)
Rx was denied because if was refilled on 02-15-13.//AB/CMA

## 2013-09-13 ENCOUNTER — Encounter: Payer: Self-pay | Admitting: Internal Medicine

## 2013-09-13 ENCOUNTER — Ambulatory Visit (INDEPENDENT_AMBULATORY_CARE_PROVIDER_SITE_OTHER): Payer: BC Managed Care – PPO | Admitting: Internal Medicine

## 2013-09-13 VITALS — BP 108/72 | HR 81 | Temp 98.4°F | Ht 67.0 in | Wt 242.5 lb

## 2013-09-13 DIAGNOSIS — R110 Nausea: Secondary | ICD-10-CM

## 2013-09-13 DIAGNOSIS — R197 Diarrhea, unspecified: Secondary | ICD-10-CM | POA: Insufficient documentation

## 2013-09-13 DIAGNOSIS — I959 Hypotension, unspecified: Secondary | ICD-10-CM

## 2013-09-13 DIAGNOSIS — F411 Generalized anxiety disorder: Secondary | ICD-10-CM

## 2013-09-13 MED ORDER — LORAZEPAM 0.5 MG PO TABS: 0.5000 mg | ORAL_TABLET | Freq: Every day | ORAL | Status: DC | PRN

## 2013-09-13 MED ORDER — LABETALOL HCL 100 MG PO TABS: 100.0000 mg | ORAL_TABLET | Freq: Two times a day (BID) | ORAL | Status: DC

## 2013-09-13 MED ORDER — ESCITALOPRAM OXALATE 10 MG PO TABS: 10.0000 mg | ORAL_TABLET | Freq: Every day | ORAL | Status: DC

## 2013-09-13 MED ORDER — ONDANSETRON HCL 4 MG PO TABS: 4.0000 mg | ORAL_TABLET | Freq: Three times a day (TID) | ORAL | Status: DC | PRN

## 2013-09-13 MED ORDER — PANTOPRAZOLE SODIUM 40 MG PO TBEC: 40.0000 mg | DELAYED_RELEASE_TABLET | Freq: Every day | ORAL | Status: DC

## 2013-09-13 NOTE — Assessment & Plan Note (Signed)
I think a primary issue, but also exac by recent symtpoms, pt asks for long term tx  - for lexapro 10 qd, also gave ativan prn limited course for panic symtpoms

## 2013-09-13 NOTE — Assessment & Plan Note (Signed)
Borderline low - ? Recent increased vagal tone state, for decr labetolol at least for now to 100 bid (now on 150bid)

## 2013-09-13 NOTE — Patient Instructions (Addendum)
Please take all new medication as prescribed - the nausea medicaiton, ativan for panic attack, lexapro for anxiety, and protonix for stomac acid  Ok to decrease the labetolol to 100 mg twice per day  Please continue all other medications as before, and refills have been done if requested.  Please have the pharmacy call with any other refills you may need.  You will be contacted regarding the referral for: GI - Dr Olevia Perches  Please return in 3 weeks to Dr Linna Darner for further treatment

## 2013-09-13 NOTE — Assessment & Plan Note (Signed)
Suspect IBS, pt asks for referral to Dr Olevia Perches, daughter sees her with similar symptoms

## 2013-09-13 NOTE — Assessment & Plan Note (Signed)
Unclear etiology, for zofran/PPI trial,  to f/u any worsening symptoms or concerns

## 2013-09-13 NOTE — Progress Notes (Signed)
Subjective:    Patient ID: Cheryl Owens, female    DOB: 1965/03/29, 48 y.o.   MRN: [id number]  HPI  Here to f/u, has been "doing a cleanse" with her daughter, felt poorly yesterday , seen at UC with neg UA and cbc per pt, felt dizzy, nausea x 2 days, "hard to describe my symptoms so that's a part of my frustration."  No Tx per UC except for diet changes.   No fever, Did not pass out, though might have been close. No HA.  MIssed her BP med yesterday, neg orthostatics yesterday at UC. Has been trying to do more steps such as 10000 steps per day with her Fitbit, walking up to an hour, outside some days.  On labetolol 300 bid.  Lying down makes better.  Not sure if more stress at work could be causing her symptoms, gma died 2 wks, daughter living with her for several months.   Lost 13 lbs in last 3 wks, she attributes to more exercise.  Denies worsening depressive symptoms, suicidal ideation, or panic; has ongoing anxiety.  No ear pain. Past Medical History  Diagnosis Date  . Hypokalemia 2007    due to HCTZ  . HTN (hypertension) 2007    seen in ER  with headaches   Past Surgical History  Procedure Laterality Date  . Arm surgery      post trauma @ age 78  . Cholecystectomy      gall stones  . Wisdom tooth extraction      reports that she has never smoked. She does not have any smokeless tobacco history on file. She reports that she does not drink alcohol or use illicit drugs. family history includes Breast cancer in her maternal aunt; Cancer in her maternal aunt and maternal grandmother; Hyperlipidemia in her mother; Hypertension in her maternal aunt, maternal grandmother, mother, and paternal grandmother; Kidney disease in her maternal aunt; Prostate cancer in her father and paternal uncle. There is no history of Stroke, Diabetes, or Heart disease. Allergies  Allergen Reactions  . Hydrochlorothiazide     REACTION: hypokalemia  . Benazepril     Nausea& vomiting, abd cramps   Current  Outpatient Prescriptions on File Prior to Visit  Medication Sig Dispense Refill  . labetalol (NORMODYNE) 300 MG tablet 1/2 bid  90 tablet  3  . spironolactone (ALDACTONE) 25 MG tablet TAKE ONE TABLET BY MOUTH TWICE DAILY  180 tablet  3   No current facility-administered medications on file prior to visit.   Review of Systems  Constitutional: Negative for unusual diaphoresis or other sweats  HENT: Negative for ringing in ear Eyes: Negative for double vision or worsening visual disturbance.  Respiratory: Negative for choking and stridor.   Gastrointestinal: Negative for vomiting or other signifcant bowel change Genitourinary: Negative for hematuria or decreased urine volume.  Musculoskeletal: Negative for other MSK pain or swelling Skin: Negative for color change and worsening wound.  Neurological: Negative for tremors and numbness other than noted  Psychiatric/Behavioral: Negative for decreased concentration or agitation other than above       Objective:   Physical Exam BP 108/72  Pulse 81  Temp(Src) 98.4 F (36.9 C) (Oral)  Ht 5\' 7"  (1.702 m)  Wt 242 lb 8 oz (109.997 kg)  BMI 37.97 kg/m2  SpO2 97% VS noted,  Constitutional: Pt appears well-developed, well-nourished.  HENT: Head: NCAT.  Right Ear: External ear normal.  Left Ear: External ear normal.  Left tm's with mod erythema.  Max sinus areas mild tender.  Pharynx with mild erythema, no exudate Eyes: . Pupils are equal, round, and reactive to light. Conjunctivae and EOM are normal Neck: Normal range of motion. Neck supple.  Cardiovascular: Normal rate and regular rhythm.   Pulmonary/Chest: Effort normal and breath sounds normal.  Abd:  Soft, NT, ND, + BS Neurological: Pt is alert. Not confused , motor grossly intact Skin: Skin is warm. No rash Psychiatric: Pt behavior is normal. No agitation. 1-2+ nervous    Assessment & Plan:   BP Readings from Last 3 Encounters:  09/13/13 108/72  02/15/13 113/77  12/13/12 133/76

## 2013-09-13 NOTE — Progress Notes (Signed)
Pre visit review using our clinic review tool, if applicable. No additional management support is needed unless otherwise documented below in the visit note. 

## 2013-09-14 ENCOUNTER — Telehealth: Payer: Self-pay | Admitting: *Deleted

## 2013-09-14 NOTE — Telephone Encounter (Signed)
Notified pt with md response. Pt will get the mucinex & try...Johny Chess

## 2013-09-14 NOTE — Telephone Encounter (Signed)
Left msg on triage stating was rx mucinex by another md yesterday for fluid in her ear. She doesn't have any of the symptoms that the mucinex is use for. Pharmacist suggested sudafed, but need to ask PCP since she has high blood pressure...Johny Chess

## 2013-09-14 NOTE — Telephone Encounter (Signed)
Sudafed not recommended with HTN

## 2013-09-16 ENCOUNTER — Ambulatory Visit: Payer: BC Managed Care – PPO | Admitting: Internal Medicine

## 2013-09-19 ENCOUNTER — Telehealth: Payer: Self-pay | Admitting: *Deleted

## 2013-09-19 NOTE — Telephone Encounter (Signed)
[facility] Triage Call Report Triage Record Num: [id number] Operator: Jeanett Schlein Patient Name: Cheryl Owens Call Date & Time: 09/16/2013 8:03:44AM Patient Phone: [phone] PCP: Patient Gender: Female PCP Fax : Patient DOB: 04/15/1965 [facility] Name[facility] Reason for Call: Caller: Adlynn/Patient; PCP: Unice Cobble; CB#: [phone]; Patient was seen in the office Tuesday 7/7. Had done a detox cleanse the week prior and was dehydrated. Dr. Jenny Reichmann gave her Lexapro and Ativan. She took the Lexapro x 2 days but didn't take same last night. She went to the ED on Thursday, 7/9 and received IVF's for dehydration. She felt better last night when she first left the hospital. Her mind is racing with thoughts constantly regardless of what she tries to focus on. She has not taken Ativan since Wednesday 7/8. She is drinking Pedialyte. Has had soup and water. Denies any vomiting or diarrhea. The call was disconnected. I called her back and completed triage. Triaged per Dehydration, Home care disposition. The call was disconnected again. I called her back to complete the call. She had an appt. with Dr. Linna Darner this afternoon but called and canceled same due to the ED visit on Thursday. She is wanting to make sure that it was safe to stop the Lexapro and Ativan (since she already has). I offered an appt. today and she declined at this time. PLEASE LET THE PATIENT KNOW. Protocol(s) Used: Dehydration Recommended Outcome per Protocol: Provide Home/Self Care Reason for Outcome: All other situations Care Advice: ~ 09/16/2013 8:21:17AM Page 1 of 1 [id number]

## 2013-09-26 ENCOUNTER — Emergency Department (HOSPITAL_COMMUNITY)
Admission: EM | Admit: 2013-09-26 | Discharge: 2013-09-26 | Disposition: A | Discharge status: Home / Self Care | Payer: BC Managed Care – PPO | Attending: Emergency Medicine | Admitting: Emergency Medicine

## 2013-09-26 ENCOUNTER — Encounter (HOSPITAL_COMMUNITY): Payer: Self-pay | Admitting: Emergency Medicine

## 2013-09-26 DIAGNOSIS — Z79899 Other long term (current) drug therapy: Secondary | ICD-10-CM | POA: Insufficient documentation

## 2013-09-26 DIAGNOSIS — F411 Generalized anxiety disorder: Secondary | ICD-10-CM | POA: Insufficient documentation

## 2013-09-26 DIAGNOSIS — F32A Depression, unspecified: Secondary | ICD-10-CM

## 2013-09-26 DIAGNOSIS — Z3202 Encounter for pregnancy test, result negative: Secondary | ICD-10-CM | POA: Insufficient documentation

## 2013-09-26 DIAGNOSIS — F329 Major depressive disorder, single episode, unspecified: Secondary | ICD-10-CM | POA: Insufficient documentation

## 2013-09-26 DIAGNOSIS — F3289 Other specified depressive episodes: Secondary | ICD-10-CM | POA: Insufficient documentation

## 2013-09-26 DIAGNOSIS — I10 Essential (primary) hypertension: Secondary | ICD-10-CM | POA: Insufficient documentation

## 2013-09-26 DIAGNOSIS — E876 Hypokalemia: Secondary | ICD-10-CM | POA: Insufficient documentation

## 2013-09-26 HISTORY — DX: Anxiety disorder, unspecified: F41.9

## 2013-09-26 HISTORY — DX: Depression, unspecified: F32.A

## 2013-09-26 HISTORY — DX: Major depressive disorder, single episode, unspecified: F32.9

## 2013-09-26 LAB — BASIC METABOLIC PANEL
Anion gap: 13 (ref 5–15)
BUN: 8 mg/dL (ref 6–23)
CO2: 22 mEq/L (ref 19–32)
Calcium: 9.5 mg/dL (ref 8.4–10.5)
Chloride: 104 mEq/L (ref 96–112)
Creatinine, Ser: 1.07 mg/dL (ref 0.50–1.10)
GFR, EST AFRICAN AMERICAN: 70 mL/min — AB (ref >90)
GFR, EST NON AFRICAN AMERICAN: 60 mL/min — AB (ref >90)
Glucose, Bld: 104 mg/dL — ABNORMAL HIGH (ref 70–99)
Potassium: 3.7 mEq/L (ref 3.7–5.3)
SODIUM: 139 meq/L (ref 137–147)

## 2013-09-26 LAB — SALICYLATE LEVEL

## 2013-09-26 LAB — CBC WITH DIFFERENTIAL/PLATELET
BASOS PCT: 0 % (ref 0–1)
Basophils Absolute: 0 10*3/uL (ref 0.0–0.1)
EOS ABS: 0.1 10*3/uL (ref 0.0–0.7)
Eosinophils Relative: 1 % (ref 0–5)
HCT: 41.1 % (ref 36.0–46.0)
Hemoglobin: 13.5 g/dL (ref 12.0–15.0)
Lymphocytes Relative: 21 % (ref 12–46)
Lymphs Abs: 2.1 10*3/uL (ref 0.7–4.0)
MCH: 28 pg (ref 26.0–34.0)
MCHC: 32.8 g/dL (ref 30.0–36.0)
MCV: 85.1 fL (ref 78.0–100.0)
MONOS PCT: 7 % (ref 3–12)
Monocytes Absolute: 0.7 10*3/uL (ref 0.1–1.0)
NEUTROS PCT: 71 % (ref 43–77)
Neutro Abs: 7.2 10*3/uL (ref 1.7–7.7)
PLATELETS: 288 10*3/uL (ref 150–400)
RBC: 4.83 MIL/uL (ref 3.87–5.11)
RDW: 14.1 % (ref 11.5–15.5)
WBC: 10.1 10*3/uL (ref 4.0–10.5)

## 2013-09-26 LAB — POC URINE PREG, ED: Preg Test, Ur: NEGATIVE

## 2013-09-26 LAB — RAPID URINE DRUG SCREEN, HOSP PERFORMED
Amphetamines: POSITIVE — AB
Barbiturates: NOT DETECTED
Benzodiazepines: NOT DETECTED
Cocaine: NOT DETECTED
Opiates: NOT DETECTED
TETRAHYDROCANNABINOL: NOT DETECTED

## 2013-09-26 LAB — ACETAMINOPHEN LEVEL

## 2013-09-26 LAB — ETHANOL: Alcohol, Ethyl (B): <11 mg/dL (ref 0–11)
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
## 2013-09-26 NOTE — ED Notes (Signed)
Patient states she came in cause she dont feel herself. Patient was recently dx with depression and was started on medication last Monday. She has been taking lexapro but states she was feeling better but since Friday she has been feeling worst. She has a follow up next week. She was seen by Psch MD and told to take half the dose of the medication. Patient denies SI. Mother states she has been having uncontrollable crying spells.

## 2013-09-26 NOTE — ED Provider Notes (Signed)
Medical screening examination/treatment/procedure(s) were performed by non-physician practitioner and as supervising physician I was immediately available for consultation/collaboration.   EKG Interpretation None        Malvin Johns, MD 09/26/13 [phone]

## 2013-09-26 NOTE — Discharge Instructions (Signed)
Depression, Adult Depression refers to feeling sad, low, down in the dumps, blue, gloomy, or empty. In general, there are two kinds of depression: 1. Depression that we all experience from time to time because of upsetting life experiences, including the loss of a job or the ending of a relationship (normal sadness or normal grief). This kind of depression is considered normal, is short lived, and resolves within a few days to 2 weeks. (Depression experienced after the loss of a loved one is called bereavement. Bereavement often lasts longer than 2 weeks but normally gets better with time.) 2. Clinical depression, which lasts longer than normal sadness or normal grief or interferes with your ability to function at home, at work, and in school. It also interferes with your personal relationships. It affects almost every aspect of your life. Clinical depression is an illness. Symptoms of depression also can be caused by conditions other than normal sadness and grief or clinical depression. Examples of these conditions are listed as follows:  Physical illness--Some physical illnesses, including underactive thyroid gland (hypothyroidism), severe anemia, specific types of cancer, diabetes, uncontrolled seizures, heart and lung problems, strokes, and chronic pain are commonly associated with symptoms of depression.  Side effects of some prescription medicine--In some people, certain types of prescription medicine can cause symptoms of depression.  Substance abuse--Abuse of alcohol and illicit drugs can cause symptoms of depression. SYMPTOMS Symptoms of normal sadness and normal grief include the following:  Feeling sad or crying for short periods of time.  Not caring about anything (apathy).  Difficulty sleeping or sleeping too much.  No longer able to enjoy the things you used to enjoy.  Desire to be by oneself all the time (social isolation).  Lack of energy or motivation.  Difficulty  concentrating or remembering.  Change in appetite or weight.  Restlessness or agitation. Symptoms of clinical depression include the same symptoms of normal sadness or normal grief and also the following symptoms:  Feeling sad or crying all the time.  Feelings of guilt or worthlessness.  Feelings of hopelessness or helplessness.  Thoughts of suicide or the desire to harm yourself (suicidal ideation).  Loss of touch with reality (psychotic symptoms). Seeing or hearing things that are not real (hallucinations) or having false beliefs about your life or the people around you (delusions and paranoia). DIAGNOSIS  The diagnosis of clinical depression usually is based on the severity and duration of the symptoms. Your caregiver also will ask you questions about your medical history and substance use to find out if physical illness, use of prescription medicine, or substance abuse is causing your depression. Your caregiver also may order blood tests. TREATMENT  Typically, normal sadness and normal grief do not require treatment. However, sometimes antidepressant medicine is prescribed for bereavement to ease the depressive symptoms until they resolve. The treatment for clinical depression depends on the severity of your symptoms but typically includes antidepressant medicine, counseling with a mental health professional, or a combination of both. Your caregiver will help to determine what treatment is best for you. Depression caused by physical illness usually goes away with appropriate medical treatment of the illness. If prescription medicine is causing depression, talk with your caregiver about stopping the medicine, decreasing the dose, or substituting another medicine. Depression caused by abuse of alcohol or illicit drugs abuse goes away with abstinence from these substances. Some adults need professional help in order to stop drinking or using drugs. SEEK IMMEDIATE CARE IF:  You have thoughts  about   hurting yourself or others.  You lose touch with reality (have psychotic symptoms).  You are taking medicine for depression and have a serious side effect. FOR MORE INFORMATION National Alliance on Mental Illness: www.nami.org National Institute of Mental Health: www.nimh.nih.gov Document Released: 02/22/2000 Document Revised: 08/26/2011 Document Reviewed: 05/26/2011 ExitCare Patient Information 2015 [employer]. This information is not intended to replace advice given to you by your health care provider. Make sure you discuss any questions you have with your health care provider.  

## 2013-09-26 NOTE — ED Notes (Signed)
ACT team is currently with pt, will obtain a set of vitals when the consult is done.

## 2013-09-26 NOTE — ED Provider Notes (Signed)
Patient assessed by psych, cleared for discharge with outpatient f/u. Will f/u with Psych doc soon. No SI.  Ephraim Hamburger, MD 09/26/13 2216

## 2013-09-26 NOTE — ED Notes (Signed)
Dc home to outpt followup

## 2013-09-27 ENCOUNTER — Encounter (HOSPITAL_COMMUNITY): Payer: Self-pay | Admitting: *Deleted
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
## 2013-09-28 ENCOUNTER — Encounter: Payer: Self-pay | Admitting: Internal Medicine

## 2013-09-28 ENCOUNTER — Other Ambulatory Visit (INDEPENDENT_AMBULATORY_CARE_PROVIDER_SITE_OTHER): Payer: BC Managed Care – PPO

## 2013-09-28 ENCOUNTER — Ambulatory Visit (INDEPENDENT_AMBULATORY_CARE_PROVIDER_SITE_OTHER): Payer: BC Managed Care – PPO | Admitting: Internal Medicine

## 2013-09-28 VITALS — BP 116/86 | HR 78 | Temp 98.1°F | Wt 233.6 lb

## 2013-09-28 DIAGNOSIS — R892 Abnormal level of other drugs, medicaments and biological substances in specimens from other organs, systems and tissues: Secondary | ICD-10-CM

## 2013-09-28 DIAGNOSIS — F411 Generalized anxiety disorder: Secondary | ICD-10-CM

## 2013-09-28 DIAGNOSIS — R825 Elevated urine levels of drugs, medicaments and biological substances: Secondary | ICD-10-CM

## 2013-09-28 LAB — T4, FREE: Free T4: 1.07 ng/dL (ref 0.60–1.60)

## 2013-09-28 LAB — TSH: TSH: 0.93 u[IU]/mL (ref 0.35–4.50)

## 2013-09-28 NOTE — Progress Notes (Signed)
   Subjective:    Patient ID: Cheryl Owens, female    DOB: 02/11/1966, 48 y.o.   MRN: [id number]  HPI  She was initially seen for anxiety and nervousness on 09/13/13 by Dr. Jenny Reichmann. She started Lexapro 10 mg daily and Ativan as needed. On Lexapro she felt that her symptoms worsened & she discontinued taking the medicine. She was seen at the [facility] several days later and given IVs for "dehydration". Her depression was not addressed. The "dehydration" was in the context of a " cleansing/detox" program for weight loss. She did lose weight with this.The chart documents a 20 # weight loss. She was seen by a [place] specialist 7/13 who suggested restarting Lexapro at 5 mg daily. She was seen in the emergency room 7/20. Those records were reviewed. The counselor recommended that she return to the [facility] to continue treatment with her psychiatrist and psychologist there. Significantly her urine showed amphetamines; otherwise the urine drug screen was negative. Her anxiety and nervousness have improved slightly with a gradual increase of the Lexapro back 10 mg daily. She states she is still unable to work. She has a follow-up appointment with a psychiatrist 7/30.  She's questioned whether her IUD may be contributing to her symptoms. She has a follow-up appointment with her gynecologist next week. Stressors include work; and memorable a; daughter moved back in with her.  Review of Systems She is sleeping poorly and has had decreased appetite. She is not having chest pain, palpitations, dyspnea, headaches, flushing. She has had some diarrheal type stools after the out cleansing. No blurred, double ,loss of vision. No change in nails,hair,skin No numbness or tingling; tremor     Objective:   Physical Exam   As per CDC guidelines weight excess is present.  Well healed extensive operative scars of RUE extremity.  She is communicative and interactive. Eye contact is good.  There is no tearfulness. She has no bizarre thoughts or hallucinations. She intermittently is joking about her weight loss with "cleansing":  Clinically there is no evidence of suicidal ideation.  MDQ . 4 + answers; "moderate" problem  General appearance is one of good health and nourishment w/o distress.  Eyes: No conjunctival inflammation or scleral icterus is present.  Oral exam: Dental hygiene is good; lips and gums are healthy appearing.There is no oropharyngeal erythema or exudate noted.   Heart:  Normal rate and regular rhythm. S1 and S2 normal without gallop, murmur, click, rub or other extra sounds     Lungs:Chest clear to auscultation; no wheezes, rhonchi,rales ,or rubs present.No increased work of breathing.   Musculoskeletal: Strength & tone normal. Gait normal  Skin:Warm & dry.  Intact without suspicious lesions or rashes ; no tenting  Lymphatic: No lymphadenopathy is noted about the head, neck, axilla              Assessment & Plan:  #1 GAD #2 UDS + for AMPHETAMINES 09/26/13 See AVS

## 2013-09-28 NOTE — Progress Notes (Signed)
   Subjective:    Patient ID: Cheryl Owens, female    DOB: 07-Jun-1965, 48 y.o.   MRN: [id number]  HPI Pt was originally seen for anxiety and nervousness on 09/13/13 by Dr. Judi Cong. She was started on Lexapro 10mg  daily and ativan PRN. Pt took two doses of lexapro and states her symptoms worsened so she quit taking the medicine. The pt went to [facility] days later and was given IVF for dehydration. Her depression was not addressed.   Pt was seen on 09/19/13 by a psychiatrist in [place] who suggested she restart her lexapro at 5mg  daily. The pt did start taking lexapro 5mg  daily and one week later, on 09/26/13, pt attempted to see Congress in [place]. She was told she needed to be seen at [facility] prior so she presented to ED 09/26/13.   Today the pt has been taking lexapro 5mg -10mg  daily since 09/19/13. She states her anxiety and nervousness have improved slightly, though she still has not been able to go to work. She is set up to see a psychiatrist at [place] on 10/06/13. Today the pt questions if all of her anxiety and nervousness arose from a 3 day juice cleanse she began shortly before the symptoms started. She also wonders if she should have her Mirena IUD taken out as she read depression was one of the SEs. She took ativan 2x yesterday and once today.   Stress at work, family member loss, and a daughter who moved back in with her are all possible stress triggers.  Review of Systems  Constitutional: Positive for appetite change.       Pt reports 20lb weight loss  Respiratory: Negative for shortness of breath.   Cardiovascular: Negative for chest pain.  Gastrointestinal: Negative for constipation.  Endocrine: Negative for cold intolerance.  Skin:       No skin or nail changes  Neurological: Negative for headaches.  Psychiatric/Behavioral: Positive for decreased concentration. Negative for suicidal ideas, confusion, sleep disturbance and self-injury. The patient  is nervous/anxious and is hyperactive.        No homicidal ideation      Objective:   Physical Exam        Assessment & Plan:

## 2013-09-28 NOTE — Patient Instructions (Signed)
Please remain out of work until Monday 10/03/13.

## 2013-09-28 NOTE — Progress Notes (Signed)
Pre visit review using our clinic review tool, if applicable. No additional management support is needed unless otherwise documented below in the visit note. 

## 2013-09-29 ENCOUNTER — Encounter: Payer: Self-pay | Admitting: Internal Medicine

## 2013-09-29 DIAGNOSIS — R825 Elevated urine levels of drugs, medicaments and biological substances: Secondary | ICD-10-CM | POA: Insufficient documentation

## 2013-10-04 ENCOUNTER — Ambulatory Visit: Payer: BC Managed Care – PPO | Admitting: Internal Medicine

## 2013-10-17 ENCOUNTER — Encounter: Payer: Self-pay | Admitting: Internal Medicine

## 2013-11-07 ENCOUNTER — Telehealth: Payer: Self-pay | Admitting: Internal Medicine

## 2013-11-07 MED ORDER — LABETALOL HCL 100 MG PO TABS
100.0000 mg | ORAL_TABLET | Freq: Two times a day (BID) | ORAL | Status: DC
Start: 2013-11-07 — End: 2014-11-07

## 2013-11-07 MED ORDER — ESCITALOPRAM OXALATE 10 MG PO TABS
10.0000 mg | ORAL_TABLET | Freq: Every day | ORAL | Status: DC
Start: 2013-11-07 — End: 2014-10-26

## 2013-11-07 NOTE — Telephone Encounter (Signed)
Pt called in, checking on a request for her lexapro that that Dr Jenny Reichmann proscribe for her 2 weeks ago.  Dr Linna Darner is her primary but she come in and seen Dr Jenny Reichmann a couple of weeks ago.  It was suppose to be sent in to [facility] per pt.   Best number to reach her is [phone]  Thank you

## 2013-11-07 NOTE — Telephone Encounter (Signed)
Called pt to verify msg pt stated she was not able to get set-up with [facility] at the time md rx lexapro. She has been set-up now would lijke the lexapro & labetalol sen to [facility]. Inform pt will send to [facility]...Cheryl Owens

## 2014-02-20 ENCOUNTER — Other Ambulatory Visit: Payer: Self-pay | Admitting: Obstetrics and Gynecology

## 2014-02-20 DIAGNOSIS — R928 Other abnormal and inconclusive findings on diagnostic imaging of breast: Secondary | ICD-10-CM

## 2014-03-10 HISTORY — PX: BREAST SURGERY: SHX581

## 2014-03-16 ENCOUNTER — Ambulatory Visit
Admission: RE | Admit: 2014-03-16 | Discharge: 2014-03-16 | Disposition: A | Discharge status: Home / Self Care | Payer: BLUE CROSS/BLUE SHIELD | Source: Ambulatory Visit | Attending: Obstetrics and Gynecology | Admitting: Obstetrics and Gynecology

## 2014-03-16 ENCOUNTER — Other Ambulatory Visit: Payer: Self-pay | Admitting: Obstetrics and Gynecology

## 2014-03-16 DIAGNOSIS — N6310 Unspecified lump in the right breast, unspecified quadrant: Secondary | ICD-10-CM

## 2014-03-16 DIAGNOSIS — R928 Other abnormal and inconclusive findings on diagnostic imaging of breast: Secondary | ICD-10-CM

## 2014-03-22 ENCOUNTER — Other Ambulatory Visit: Payer: Self-pay | Admitting: Obstetrics and Gynecology

## 2014-03-22 DIAGNOSIS — N6310 Unspecified lump in the right breast, unspecified quadrant: Secondary | ICD-10-CM

## 2014-03-27 ENCOUNTER — Ambulatory Visit
Admission: RE | Admit: 2014-03-27 | Discharge: 2014-03-27 | Disposition: A | Discharge status: Home / Self Care | Payer: BLUE CROSS/BLUE SHIELD | Source: Ambulatory Visit | Attending: Obstetrics and Gynecology | Admitting: Obstetrics and Gynecology

## 2014-03-27 ENCOUNTER — Other Ambulatory Visit: Payer: BLUE CROSS/BLUE SHIELD

## 2014-03-27 DIAGNOSIS — N6310 Unspecified lump in the right breast, unspecified quadrant: Secondary | ICD-10-CM

## 2014-04-13 ENCOUNTER — Ambulatory Visit (INDEPENDENT_AMBULATORY_CARE_PROVIDER_SITE_OTHER): Payer: BLUE CROSS/BLUE SHIELD | Admitting: Family Medicine

## 2014-04-13 ENCOUNTER — Encounter: Payer: Self-pay | Admitting: Family Medicine

## 2014-04-13 VITALS — BP 119/82 | HR 64 | Temp 97.8°F | Ht 67.0 in | Wt 224.6 lb

## 2014-04-13 DIAGNOSIS — M722 Plantar fascial fibromatosis: Secondary | ICD-10-CM

## 2014-04-13 DIAGNOSIS — D259 Leiomyoma of uterus, unspecified: Secondary | ICD-10-CM

## 2014-04-13 DIAGNOSIS — I10 Essential (primary) hypertension: Secondary | ICD-10-CM

## 2014-04-13 DIAGNOSIS — Z0000 Encounter for general adult medical examination without abnormal findings: Secondary | ICD-10-CM

## 2014-04-13 DIAGNOSIS — R8290 Unspecified abnormal findings in urine: Secondary | ICD-10-CM

## 2014-04-13 DIAGNOSIS — E785 Hyperlipidemia, unspecified: Secondary | ICD-10-CM

## 2014-04-13 MED ORDER — SPIRONOLACTONE 25 MG PO TABS: 25.0000 mg | ORAL_TABLET | Freq: Two times a day (BID) | ORAL | Status: DC

## 2014-04-13 NOTE — Progress Notes (Signed)
Subjective:     Cheryl Owens is a 49 y.o. female and is here for a comprehensive physical exam. The patient reports problems - struggling with stress and anxiety--- working with psych.  Pt also c/o plantar fascitis in R foot.  She complaining of pain with walking --eases up.  She is questioning robot surgery for fibroid removal.     History   Social History  . Marital Status: Single    Spouse Name: N/A    Number of Children: N/A  . Years of Education: N/A   Occupational History  . Not on file.   Social History Main Topics  . Smoking status: Never Smoker   . Smokeless tobacco: Not on file  . Alcohol Use: No  . Drug Use: No  . Sexual Activity: No   Other Topics Concern  . Not on file   Social History Narrative   Health Maintenance  Topic Date Due  . INFLUENZA VACCINE  10/08/2013  . MAMMOGRAM  03/28/2015  . PAP SMEAR  02/14/2017  . TETANUS/TDAP  09/29/2018    The following portions of the patient's history were reviewed and updated as appropriate:  She  has a past medical history of Hypokalemia (2007); HTN (hypertension) (2007); Depression; and Anxiety. She  does not have any pertinent problems on file. She  has past surgical history that includes arm surgery; Cholecystectomy; and Wisdom tooth extraction. Her family history includes Bipolar disorder in her paternal aunt; Breast cancer in her maternal aunt; Cancer in her maternal aunt and maternal grandmother; Hyperlipidemia in her mother; Hypertension in her maternal aunt, maternal grandmother, mother, and paternal grandmother; Kidney disease in her maternal aunt; Prostate cancer in her father and paternal uncle. There is no history of Stroke, Diabetes, or Heart disease. She  reports that she has never smoked. She does not have any smokeless tobacco history on file. She reports that she does not drink alcohol or use illicit drugs. She has a current medication list which includes the following prescription(s):  amphetamine-dextroamphetamine, bupropion, clonazepam, escitalopram, labetalol, and spironolactone. Current Outpatient Prescriptions on File Prior to Visit  Medication Sig Dispense Refill  . escitalopram (LEXAPRO) 10 MG tablet Take 1 tablet (10 mg total) by mouth daily. 90 tablet 3  . labetalol (NORMODYNE) 100 MG tablet Take 1 tablet (100 mg total) by mouth 2 (two) times daily. 180 tablet 3   No current facility-administered medications on file prior to visit.   She is allergic to hydrochlorothiazide and benazepril..  Review of Systems Review of Systems  Constitutional: Negative for activity change, appetite change and fatigue.  HENT: Negative for hearing loss, congestion, tinnitus and ear discharge.  dentist q19m Eyes: Negative for visual disturbance (see optho q1y -- vision corrected to 20/20 with glasses).  Respiratory: Negative for cough, chest tightness and shortness of breath.   Cardiovascular: Negative for chest pain, palpitations and leg swelling.  Gastrointestinal: Negative for abdominal pain, diarrhea, constipation and abdominal distention.  Genitourinary: Negative for urgency, frequency, decreased urine volume and difficulty urinating.  Musculoskeletal: Negative for back pain, arthralgias and gait problem.  Skin: Negative for color change, pallor and rash.  Neurological: Negative for dizziness, light-headedness, numbness and headaches.  Hematological: Negative for adenopathy. Does not bruise/bleed easily.  Psychiatric/Behavioral: Negative for suicidal ideas, confusion, sleep disturbance, self-injury, dysphoric mood, decreased concentration and agitation.       Objective:    BP 119/82 mmHg  Pulse 64  Temp(Src) 97.8 F (36.6 C) (Oral)  Ht 5\' 7"  (1.702 m)  Wt 224 lb 9.6 oz (101.878 kg)  BMI 35.17 kg/m2  SpO2 99%  LMP 04/10/2014 General appearance: alert, cooperative, appears stated age and no distress Head: Normocephalic, without obvious abnormality, atraumatic Eyes:  conjunctivae/corneas clear. PERRL, EOM&#39;s intact. Fundi benign. Ears: normal TM's and external ear canals both ears Nose: Nares normal. Septum midline. Mucosa normal. No drainage or sinus tenderness. Throat: lips, mucosa, and tongue normal; teeth and gums normal Neck: no adenopathy, no carotid bruit, no JVD, supple, symmetrical, trachea midline and thyroid not enlarged, symmetric, no tenderness/mass/nodules Back: symmetric, no curvature. ROM normal. No CVA tenderness. Lungs: clear to auscultation bilaterally Breasts: gyn Heart: regular rate and rhythm, S1, S2 normal, no murmur, click, rub or gallop Abdomen: soft, non-tender; bowel sounds normal; no masses,  no organomegaly Pelvic: deferred--gyn Extremities: extremities normal, atraumatic, no cyanosis or edema Pulses: 2+ and symmetric Skin: Skin color, texture, turgor normal. No rashes or lesions Lymph nodes: Cervical, supraclavicular, and axillary nodes normal. Neurologic: Alert and oriented X 3, normal strength and tone. Normal symmetric reflexes. Normal coordination and gait Psych--no depression, no anxiety      Assessment:    Healthy female exam.     Plan:    ghm utd Check labs See After Visit Summary for Counseling Recommendations    1. Essential hypertension con't metoprolol and spironolactone - Basic metabolic panel - CBC with Differential/Platelet  2. Hyperlipidemia Check labs - Hepatic function panel - Lipid panel - POCT urinalysis dipstick - TSH  3. Preventative health care   - Basic metabolic panel - CBC with Differential/Platelet - Hepatic function panel - Lipid panel - POCT urinalysis dipstick - TSH

## 2014-04-13 NOTE — Patient Instructions (Signed)
Preventive Care for Adults A healthy lifestyle and preventive care can promote health and wellness. Preventive health guidelines for women include the following key practices.  A routine yearly physical is a good way to check with your health care provider about your health and preventive screening. It is a chance to share any concerns and updates on your health and to receive a thorough exam.  Visit your dentist for a routine exam and preventive care every 6 months. Brush your teeth twice a day and floss once a day. Good oral hygiene prevents tooth decay and gum disease.  The frequency of eye exams is based on your age, health, family medical history, use of contact lenses, and other factors. Follow your health care provider's recommendations for frequency of eye exams.  Eat a healthy diet. Foods like vegetables, fruits, whole grains, low-fat dairy products, and lean protein foods contain the nutrients you need without too many calories. Decrease your intake of foods high in solid fats, added sugars, and salt. Eat the right amount of calories for you.Get information about a proper diet from your health care provider, if necessary.  Regular physical exercise is one of the most important things you can do for your health. Most adults should get at least 150 minutes of moderate-intensity exercise (any activity that increases your heart rate and causes you to sweat) each week. In addition, most adults need muscle-strengthening exercises on 2 or more days a week.  Maintain a healthy weight. The body mass index (BMI) is a screening tool to identify possible weight problems. It provides an estimate of body fat based on height and weight. Your health care provider can find your BMI and can help you achieve or maintain a healthy weight.For adults 20 years and older:  A BMI below 18.5 is considered underweight.  A BMI of 18.5 to 24.9 is normal.  A BMI of 25 to 29.9 is considered overweight.  A BMI of  30 and above is considered obese.  Maintain normal blood lipids and cholesterol levels by exercising and minimizing your intake of saturated fat. Eat a balanced diet with plenty of fruit and vegetables. Blood tests for lipids and cholesterol should begin at age 76 and be repeated every 5 years. If your lipid or cholesterol levels are high, you are over 50, or you are at high risk for heart disease, you may need your cholesterol levels checked more frequently.Ongoing high lipid and cholesterol levels should be treated with medicines if diet and exercise are not working.  If you smoke, find out from your health care provider how to quit. If you do not use tobacco, do not start.  Lung cancer screening is recommended for adults aged 22-80 years who are at high risk for developing lung cancer because of a history of smoking. A yearly low-dose CT scan of the lungs is recommended for people who have at least a 30-pack-year history of smoking and are a current smoker or have quit within the past 15 years. A pack year of smoking is smoking an average of 1 pack of cigarettes a day for 1 year (for example: 1 pack a day for 30 years or 2 packs a day for 15 years). Yearly screening should continue until the smoker has stopped smoking for at least 15 years. Yearly screening should be stopped for people who develop a health problem that would prevent them from having lung cancer treatment.  If you are pregnant, do not drink alcohol. If you are breastfeeding,  be very cautious about drinking alcohol. If you are not pregnant and choose to drink alcohol, do not have more than 1 drink per day. One drink is considered to be 12 ounces (355 mL) of beer, 5 ounces (148 mL) of wine, or 1.5 ounces (44 mL) of liquor.  Avoid use of street drugs. Do not share needles with anyone. Ask for help if you need support or instructions about stopping the use of drugs.  High blood pressure causes heart disease and increases the risk of  stroke. Your blood pressure should be checked at least every 1 to 2 years. Ongoing high blood pressure should be treated with medicines if weight loss and exercise do not work.  If you are 75-52 years old, ask your health care provider if you should take aspirin to prevent strokes.  Diabetes screening involves taking a blood sample to check your fasting blood sugar level. This should be done once every 3 years, after age 15, if you are within normal weight and without risk factors for diabetes. Testing should be considered at a younger age or be carried out more frequently if you are overweight and have at least 1 risk factor for diabetes.  Breast cancer screening is essential preventive care for women. You should practice "breast self-awareness." This means understanding the normal appearance and feel of your breasts and may include breast self-examination. Any changes detected, no matter how small, should be reported to a health care provider. Women in their 58s and 30s should have a clinical breast exam (CBE) by a health care provider as part of a regular health exam every 1 to 3 years. After age 16, women should have a CBE every year. Starting at age 53, women should consider having a mammogram (breast X-ray test) every year. Women who have a family history of breast cancer should talk to their health care provider about genetic screening. Women at a high risk of breast cancer should talk to their health care providers about having an MRI and a mammogram every year.  Breast cancer gene (BRCA)-related cancer risk assessment is recommended for women who have family members with BRCA-related cancers. BRCA-related cancers include breast, ovarian, tubal, and peritoneal cancers. Having family members with these cancers may be associated with an increased risk for harmful changes (mutations) in the breast cancer genes BRCA1 and BRCA2. Results of the assessment will determine the need for genetic counseling and  BRCA1 and BRCA2 testing.  Routine pelvic exams to screen for cancer are no longer recommended for nonpregnant women who are considered low risk for cancer of the pelvic organs (ovaries, uterus, and vagina) and who do not have symptoms. Ask your health care provider if a screening pelvic exam is right for you.  If you have had past treatment for cervical cancer or a condition that could lead to cancer, you need Pap tests and screening for cancer for at least 20 years after your treatment. If Pap tests have been discontinued, your risk factors (such as having a new sexual partner) need to be reassessed to determine if screening should be resumed. Some women have medical problems that increase the chance of getting cervical cancer. In these cases, your health care provider may recommend more frequent screening and Pap tests.  The HPV test is an additional test that may be used for cervical cancer screening. The HPV test looks for the virus that can cause the cell changes on the cervix. The cells collected during the Pap test can be  tested for HPV. The HPV test could be used to screen women aged 30 years and older, and should be used in women of any age who have unclear Pap test results. After the age of 30, women should have HPV testing at the same frequency as a Pap test.  Colorectal cancer can be detected and often prevented. Most routine colorectal cancer screening begins at the age of 50 years and continues through age 75 years. However, your health care provider may recommend screening at an earlier age if you have risk factors for colon cancer. On a yearly basis, your health care provider may provide home test kits to check for hidden blood in the stool. Use of a small camera at the end of a tube, to directly examine the colon (sigmoidoscopy or colonoscopy), can detect the earliest forms of colorectal cancer. Talk to your health care provider about this at age 50, when routine screening begins. Direct  exam of the colon should be repeated every 5-10 years through age 75 years, unless early forms of pre-cancerous polyps or small growths are found.  People who are at an increased risk for hepatitis B should be screened for this virus. You are considered at high risk for hepatitis B if:  You were born in a country where hepatitis B occurs often. Talk with your health care provider about which countries are considered high risk.  Your parents were born in a high-risk country and you have not received a shot to protect against hepatitis B (hepatitis B vaccine).  You have HIV or AIDS.  You use needles to inject street drugs.  You live with, or have sex with, someone who has hepatitis B.  You get hemodialysis treatment.  You take certain medicines for conditions like cancer, organ transplantation, and autoimmune conditions.  Hepatitis C blood testing is recommended for all people born from 1945 through 1965 and any individual with known risks for hepatitis C.  Practice safe sex. Use condoms and avoid high-risk sexual practices to reduce the spread of sexually transmitted infections (STIs). STIs include gonorrhea, chlamydia, syphilis, trichomonas, herpes, HPV, and human immunodeficiency virus (HIV). Herpes, HIV, and HPV are viral illnesses that have no cure. They can result in disability, cancer, and death.  You should be screened for sexually transmitted illnesses (STIs) including gonorrhea and chlamydia if:  You are sexually active and are younger than 24 years.  You are older than 24 years and your health care provider tells you that you are at risk for this type of infection.  Your sexual activity has changed since you were last screened and you are at an increased risk for chlamydia or gonorrhea. Ask your health care provider if you are at risk.  If you are at risk of being infected with HIV, it is recommended that you take a prescription medicine daily to prevent HIV infection. This is  called preexposure prophylaxis (PrEP). You are considered at risk if:  You are a heterosexual woman, are sexually active, and are at increased risk for HIV infection.  You take drugs by injection.  You are sexually active with a partner who has HIV.  Talk with your health care provider about whether you are at high risk of being infected with HIV. If you choose to begin PrEP, you should first be tested for HIV. You should then be tested every 3 months for as long as you are taking PrEP.  Osteoporosis is a disease in which the bones lose minerals and strength   with aging. This can result in serious bone fractures or breaks. The risk of osteoporosis can be identified using a bone density scan. Women ages 65 years and over and women at risk for fractures or osteoporosis should discuss screening with their health care providers. Ask your health care provider whether you should take a calcium supplement or vitamin D to reduce the rate of osteoporosis.  Menopause can be associated with physical symptoms and risks. Hormone replacement therapy is available to decrease symptoms and risks. You should talk to your health care provider about whether hormone replacement therapy is right for you.  Use sunscreen. Apply sunscreen liberally and repeatedly throughout the day. You should seek shade when your shadow is shorter than you. Protect yourself by wearing long sleeves, pants, a wide-brimmed hat, and sunglasses year round, whenever you are outdoors.  Once a month, do a whole body skin exam, using a mirror to look at the skin on your back. Tell your health care provider of new moles, moles that have irregular borders, moles that are larger than a pencil eraser, or moles that have changed in shape or color.  Stay current with required vaccines (immunizations).  Influenza vaccine. All adults should be immunized every year.  Tetanus, diphtheria, and acellular pertussis (Td, Tdap) vaccine. Pregnant women should  receive 1 dose of Tdap vaccine during each pregnancy. The dose should be obtained regardless of the length of time since the last dose. Immunization is preferred during the 27th-36th week of gestation. An adult who has not previously received Tdap or who does not know her vaccine status should receive 1 dose of Tdap. This initial dose should be followed by tetanus and diphtheria toxoids (Td) booster doses every 10 years. Adults with an unknown or incomplete history of completing a 3-dose immunization series with Td-containing vaccines should begin or complete a primary immunization series including a Tdap dose. Adults should receive a Td booster every 10 years.  Varicella vaccine. An adult without evidence of immunity to varicella should receive 2 doses or a second dose if she has previously received 1 dose. Pregnant females who do not have evidence of immunity should receive the first dose after pregnancy. This first dose should be obtained before leaving the health care facility. The second dose should be obtained 4-8 weeks after the first dose.  Human papillomavirus (HPV) vaccine. Females aged 13-26 years who have not received the vaccine previously should obtain the 3-dose series. The vaccine is not recommended for use in pregnant females. However, pregnancy testing is not needed before receiving a dose. If a female is found to be pregnant after receiving a dose, no treatment is needed. In that case, the remaining doses should be delayed until after the pregnancy. Immunization is recommended for any person with an immunocompromised condition through the age of 26 years if she did not get any or all doses earlier. During the 3-dose series, the second dose should be obtained 4-8 weeks after the first dose. The third dose should be obtained 24 weeks after the first dose and 16 weeks after the second dose.  Zoster vaccine. One dose is recommended for adults aged 60 years or older unless certain conditions are  present.  Measles, mumps, and rubella (MMR) vaccine. Adults born before 1957 generally are considered immune to measles and mumps. Adults born in 1957 or later should have 1 or more doses of MMR vaccine unless there is a contraindication to the vaccine or there is laboratory evidence of immunity to   each of the three diseases. A routine second dose of MMR vaccine should be obtained at least 28 days after the first dose for students attending postsecondary schools, health care workers, or international travelers. People who received inactivated measles vaccine or an unknown type of measles vaccine during 1963-1967 should receive 2 doses of MMR vaccine. People who received inactivated mumps vaccine or an unknown type of mumps vaccine before 1979 and are at high risk for mumps infection should consider immunization with 2 doses of MMR vaccine. For females of childbearing age, rubella immunity should be determined. If there is no evidence of immunity, females who are not pregnant should be vaccinated. If there is no evidence of immunity, females who are pregnant should delay immunization until after pregnancy. Unvaccinated health care workers born before 1957 who lack laboratory evidence of measles, mumps, or rubella immunity or laboratory confirmation of disease should consider measles and mumps immunization with 2 doses of MMR vaccine or rubella immunization with 1 dose of MMR vaccine.  Pneumococcal 13-valent conjugate (PCV13) vaccine. When indicated, a person who is uncertain of her immunization history and has no record of immunization should receive the PCV13 vaccine. An adult aged 19 years or older who has certain medical conditions and has not been previously immunized should receive 1 dose of PCV13 vaccine. This PCV13 should be followed with a dose of pneumococcal polysaccharide (PPSV23) vaccine. The PPSV23 vaccine dose should be obtained at least 8 weeks after the dose of PCV13 vaccine. An adult aged 19  years or older who has certain medical conditions and previously received 1 or more doses of PPSV23 vaccine should receive 1 dose of PCV13. The PCV13 vaccine dose should be obtained 1 or more years after the last PPSV23 vaccine dose.  Pneumococcal polysaccharide (PPSV23) vaccine. When PCV13 is also indicated, PCV13 should be obtained first. All adults aged 65 years and older should be immunized. An adult younger than age 65 years who has certain medical conditions should be immunized. Any person who resides in a nursing home or long-term care facility should be immunized. An adult smoker should be immunized. People with an immunocompromised condition and certain other conditions should receive both PCV13 and PPSV23 vaccines. People with human immunodeficiency virus (HIV) infection should be immunized as soon as possible after diagnosis. Immunization during chemotherapy or radiation therapy should be avoided. Routine use of PPSV23 vaccine is not recommended for American Indians, [place] Natives, or people younger than 65 years unless there are medical conditions that require PPSV23 vaccine. When indicated, people who have unknown immunization and have no record of immunization should receive PPSV23 vaccine. One-time revaccination 5 years after the first dose of PPSV23 is recommended for people aged 19-64 years who have chronic kidney failure, nephrotic syndrome, asplenia, or immunocompromised conditions. People who received 1-2 doses of PPSV23 before age 65 years should receive another dose of PPSV23 vaccine at age 65 years or later if at least 5 years have passed since the previous dose. Doses of PPSV23 are not needed for people immunized with PPSV23 at or after age 65 years.  Meningococcal vaccine. Adults with asplenia or persistent complement component deficiencies should receive 2 doses of quadrivalent meningococcal conjugate (MenACWY-D) vaccine. The doses should be obtained at least 2 months apart.  Microbiologists working with certain meningococcal bacteria, military recruits, people at risk during an outbreak, and people who travel to or live in countries with a high rate of meningitis should be immunized. A first-year college student up through age   21 years who is living in a residence hall should receive a dose if she did not receive a dose on or after her 16th birthday. Adults who have certain high-risk conditions should receive one or more doses of vaccine.  Hepatitis A vaccine. Adults who wish to be protected from this disease, have certain high-risk conditions, work with hepatitis A-infected animals, work in hepatitis A research labs, or travel to or work in countries with a high rate of hepatitis A should be immunized. Adults who were previously unvaccinated and who anticipate close contact with an international adoptee during the first 60 days after arrival in the [place] from a country with a high rate of hepatitis A should be immunized.  Hepatitis B vaccine. Adults who wish to be protected from this disease, have certain high-risk conditions, may be exposed to blood or other infectious body fluids, are household contacts or sex partners of hepatitis B positive people, are clients or workers in certain care facilities, or travel to or work in countries with a high rate of hepatitis B should be immunized.  Haemophilus influenzae type b (Hib) vaccine. A previously unvaccinated person with asplenia or sickle cell disease or having a scheduled splenectomy should receive 1 dose of Hib vaccine. Regardless of previous immunization, a recipient of a hematopoietic stem cell transplant should receive a 3-dose series 6-12 months after her successful transplant. Hib vaccine is not recommended for adults with HIV infection. Preventive Services / Frequency Ages 64 to 68 years  Blood pressure check.** / Every 1 to 2 years.  Lipid and cholesterol check.** / Every 5 years beginning at age  22.  Clinical breast exam.** / Every 3 years for women in their 88s and 53s.  BRCA-related cancer risk assessment.** / For women who have family members with a BRCA-related cancer (breast, ovarian, tubal, or peritoneal cancers).  Pap test.** / Every 2 years from ages 90 through 51. Every 3 years starting at age 21 through age 56 or 3 with a history of 3 consecutive normal Pap tests.  HPV screening.** / Every 3 years from ages 24 through ages 1 to 46 with a history of 3 consecutive normal Pap tests.  Hepatitis C blood test.** / For any individual with known risks for hepatitis C.  Skin self-exam. / Monthly.  Influenza vaccine. / Every year.  Tetanus, diphtheria, and acellular pertussis (Tdap, Td) vaccine.** / Consult your health care provider. Pregnant women should receive 1 dose of Tdap vaccine during each pregnancy. 1 dose of Td every 10 years.  Varicella vaccine.** / Consult your health care provider. Pregnant females who do not have evidence of immunity should receive the first dose after pregnancy.  HPV vaccine. / 3 doses over 6 months, if 72 and younger. The vaccine is not recommended for use in pregnant females. However, pregnancy testing is not needed before receiving a dose.  Measles, mumps, rubella (MMR) vaccine.** / You need at least 1 dose of MMR if you were born in 1957 or later. You may also need a 2nd dose. For females of childbearing age, rubella immunity should be determined. If there is no evidence of immunity, females who are not pregnant should be vaccinated. If there is no evidence of immunity, females who are pregnant should delay immunization until after pregnancy.  Pneumococcal 13-valent conjugate (PCV13) vaccine.** / Consult your health care provider.  Pneumococcal polysaccharide (PPSV23) vaccine.** / 1 to 2 doses if you smoke cigarettes or if you have certain conditions.  Meningococcal vaccine.** /  1 dose if you are age 19 to 21 years and a first-year college  student living in a residence hall, or have one of several medical conditions, you need to get vaccinated against meningococcal disease. You may also need additional booster doses.  Hepatitis A vaccine.** / Consult your health care provider.  Hepatitis B vaccine.** / Consult your health care provider.  Haemophilus influenzae type b (Hib) vaccine.** / Consult your health care provider. Ages 40 to 64 years  Blood pressure check.** / Every 1 to 2 years.  Lipid and cholesterol check.** / Every 5 years beginning at age 20 years.  Lung cancer screening. / Every year if you are aged 55-80 years and have a 30-pack-year history of smoking and currently smoke or have quit within the past 15 years. Yearly screening is stopped once you have quit smoking for at least 15 years or develop a health problem that would prevent you from having lung cancer treatment.  Clinical breast exam.** / Every year after age 40 years.  BRCA-related cancer risk assessment.** / For women who have family members with a BRCA-related cancer (breast, ovarian, tubal, or peritoneal cancers).  Mammogram.** / Every year beginning at age 40 years and continuing for as long as you are in good health. Consult with your health care provider.  Pap test.** / Every 3 years starting at age 30 years through age 65 or 70 years with a history of 3 consecutive normal Pap tests.  HPV screening.** / Every 3 years from ages 30 years through ages 65 to 70 years with a history of 3 consecutive normal Pap tests.  Fecal occult blood test (FOBT) of stool. / Every year beginning at age 50 years and continuing until age 75 years. You may not need to do this test if you get a colonoscopy every 10 years.  Flexible sigmoidoscopy or colonoscopy.** / Every 5 years for a flexible sigmoidoscopy or every 10 years for a colonoscopy beginning at age 50 years and continuing until age 75 years.  Hepatitis C blood test.** / For all people born from 1945 through  1965 and any individual with known risks for hepatitis C.  Skin self-exam. / Monthly.  Influenza vaccine. / Every year.  Tetanus, diphtheria, and acellular pertussis (Tdap/Td) vaccine.** / Consult your health care provider. Pregnant women should receive 1 dose of Tdap vaccine during each pregnancy. 1 dose of Td every 10 years.  Varicella vaccine.** / Consult your health care provider. Pregnant females who do not have evidence of immunity should receive the first dose after pregnancy.  Zoster vaccine.** / 1 dose for adults aged 60 years or older.  Measles, mumps, rubella (MMR) vaccine.** / You need at least 1 dose of MMR if you were born in 1957 or later. You may also need a 2nd dose. For females of childbearing age, rubella immunity should be determined. If there is no evidence of immunity, females who are not pregnant should be vaccinated. If there is no evidence of immunity, females who are pregnant should delay immunization until after pregnancy.  Pneumococcal 13-valent conjugate (PCV13) vaccine.** / Consult your health care provider.  Pneumococcal polysaccharide (PPSV23) vaccine.** / 1 to 2 doses if you smoke cigarettes or if you have certain conditions.  Meningococcal vaccine.** / Consult your health care provider.  Hepatitis A vaccine.** / Consult your health care provider.  Hepatitis B vaccine.** / Consult your health care provider.  Haemophilus influenzae type b (Hib) vaccine.** / Consult your health care provider. Ages 65   years and over  Blood pressure check.** / Every 1 to 2 years.  Lipid and cholesterol check.** / Every 5 years beginning at age 22 years.  Lung cancer screening. / Every year if you are aged 73-80 years and have a 30-pack-year history of smoking and currently smoke or have quit within the past 15 years. Yearly screening is stopped once you have quit smoking for at least 15 years or develop a health problem that would prevent you from having lung cancer  treatment.  Clinical breast exam.** / Every year after age 4 years.  BRCA-related cancer risk assessment.** / For women who have family members with a BRCA-related cancer (breast, ovarian, tubal, or peritoneal cancers).  Mammogram.** / Every year beginning at age 40 years and continuing for as long as you are in good health. Consult with your health care provider.  Pap test.** / Every 3 years starting at age 9 years through age 34 or 91 years with 3 consecutive normal Pap tests. Testing can be stopped between 65 and 70 years with 3 consecutive normal Pap tests and no abnormal Pap or HPV tests in the past 10 years.  HPV screening.** / Every 3 years from ages 57 years through ages 64 or 45 years with a history of 3 consecutive normal Pap tests. Testing can be stopped between 65 and 70 years with 3 consecutive normal Pap tests and no abnormal Pap or HPV tests in the past 10 years.  Fecal occult blood test (FOBT) of stool. / Every year beginning at age 15 years and continuing until age 17 years. You may not need to do this test if you get a colonoscopy every 10 years.  Flexible sigmoidoscopy or colonoscopy.** / Every 5 years for a flexible sigmoidoscopy or every 10 years for a colonoscopy beginning at age 86 years and continuing until age 71 years.  Hepatitis C blood test.** / For all people born from [birth year] through 1965 and any individual with known risks for hepatitis C.  Osteoporosis screening.** / A one-time screening for women ages 83 years and over and women at risk for fractures or osteoporosis.  Skin self-exam. / Monthly.  Influenza vaccine. / Every year.  Tetanus, diphtheria, and acellular pertussis (Tdap/Td) vaccine.** / 1 dose of Td every 10 years.  Varicella vaccine.** / Consult your health care provider.  Zoster vaccine.** / 1 dose for adults aged 61 years or older.  Pneumococcal 13-valent conjugate (PCV13) vaccine.** / Consult your health care provider.  Pneumococcal  polysaccharide (PPSV23) vaccine.** / 1 dose for all adults aged 28 years and older.  Meningococcal vaccine.** / Consult your health care provider.  Hepatitis A vaccine.** / Consult your health care provider.  Hepatitis B vaccine.** / Consult your health care provider.  Haemophilus influenzae type b (Hib) vaccine.** / Consult your health care provider. ** Family history and personal history of risk and conditions may change your health care provider's recommendations. Document Released: 04/22/2001 Document Revised: 07/11/2013 Document Reviewed: 07/22/2010 [facility] Patient Information 2015 [place], [place]. This information is not intended to replace advice given to you by your health care provider. Make sure you discuss any questions you have with your health care provider.

## 2014-04-13 NOTE — Assessment & Plan Note (Signed)
Gel insets  Good shoes, Tylenol arthritis Stretching exercises Refer to sports med vs podiatry prn

## 2014-04-13 NOTE — Assessment & Plan Note (Signed)
Per gyn 

## 2014-04-13 NOTE — Progress Notes (Signed)
Pre visit review using our clinic review tool, if applicable. No additional management support is needed unless otherwise documented below in the visit note. 

## 2014-04-20 ENCOUNTER — Other Ambulatory Visit: Payer: BLUE CROSS/BLUE SHIELD

## 2014-04-20 LAB — BASIC METABOLIC PANEL
BUN: 14 mg/dL (ref 6–23)
CO2: 25 mEq/L (ref 19–32)
CREATININE: 1.18 mg/dL (ref 0.40–1.20)
Calcium: 9.3 mg/dL (ref 8.4–10.5)
Chloride: 106 mEq/L (ref 96–112)
GFR: 62.62 mL/min (ref >60.00)
Glucose, Bld: 79 mg/dL (ref 70–99)
POTASSIUM: 4 meq/L (ref 3.5–5.1)
Sodium: 138 mEq/L (ref 135–145)

## 2014-04-20 LAB — CBC WITH DIFFERENTIAL/PLATELET
Basophils Absolute: 0 10*3/uL (ref 0.0–0.1)
Basophils Relative: 0.5 % (ref 0.0–3.0)
EOS PCT: 0.5 % (ref 0.0–5.0)
Eosinophils Absolute: 0 10*3/uL (ref 0.0–0.7)
HCT: 37.5 % (ref 36.0–46.0)
Hemoglobin: 12.3 g/dL (ref 12.0–15.0)
Lymphocytes Relative: 23.7 % (ref 12.0–46.0)
Lymphs Abs: 1.6 10*3/uL (ref 0.7–4.0)
MCHC: 32.8 g/dL (ref 30.0–36.0)
MCV: 86.4 fl (ref 78.0–100.0)
Monocytes Absolute: 0.5 10*3/uL (ref 0.1–1.0)
Monocytes Relative: 7.3 % (ref 3.0–12.0)
Neutro Abs: 4.5 10*3/uL (ref 1.4–7.7)
Neutrophils Relative %: 68 % (ref 43.0–77.0)
Platelets: 276 10*3/uL (ref 150.0–400.0)
RBC: 4.34 Mil/uL (ref 3.87–5.11)
RDW: 14.3 % (ref 11.5–15.5)
WBC: 6.6 10*3/uL (ref 4.0–10.5)

## 2014-04-20 LAB — LIPID PANEL
CHOLESTEROL: 173 mg/dL (ref 0–200)
HDL: 48 mg/dL (ref >39.00)
LDL Cholesterol: 113 mg/dL — ABNORMAL HIGH (ref 0–99)
NonHDL: 125
TRIGLYCERIDES: 59 mg/dL (ref 0.0–149.0)
Total CHOL/HDL Ratio: 4
VLDL: 11.8 mg/dL (ref 0.0–40.0)

## 2014-04-20 LAB — POCT URINALYSIS DIPSTICK
BILIRUBIN UA: NEGATIVE
Blood, UA: NEGATIVE
GLUCOSE UA: NEGATIVE
Leukocytes, UA: NEGATIVE
NITRITE UA: NEGATIVE
Spec Grav, UA: 1.025
Urobilinogen, UA: NEGATIVE
pH, UA: 6

## 2014-04-20 LAB — HEPATIC FUNCTION PANEL
ALT: 15 U/L (ref 0–35)
AST: 15 U/L (ref 0–37)
Albumin: 3.8 g/dL (ref 3.5–5.2)
Alkaline Phosphatase: 82 U/L (ref 39–117)
BILIRUBIN TOTAL: 0.5 mg/dL (ref 0.2–1.2)
Bilirubin, Direct: 0.1 mg/dL (ref 0.0–0.3)
TOTAL PROTEIN: 6.7 g/dL (ref 6.0–8.3)

## 2014-04-20 LAB — TSH: TSH: 2.23 u[IU]/mL (ref 0.35–4.50)

## 2014-04-20 NOTE — Addendum Note (Signed)
Addended by: Peggyann Shoals on: 04/20/2014 11:40 AM   Modules accepted: Orders

## 2014-04-21 LAB — URINE CULTURE
Colony Count: NO GROWTH
ORGANISM ID, BACTERIA: NO GROWTH

## 2014-08-04 ENCOUNTER — Telehealth: Payer: Self-pay | Admitting: Family Medicine

## 2014-08-04 NOTE — Telephone Encounter (Signed)
Refill klonopin x1 Refill lexapro x6 m adderall for 3 months

## 2014-08-04 NOTE — Telephone Encounter (Signed)
Caller name: [name] Relation to pt: self Call back number: [phone] Pharmacy:  Reason for call:   Patient states that her psychiatrist can no longer prescribe the medication that she is on. Is wanting to know if Dr. Etter Sjogren can prescribe or does patient need to find a new psychriatrist.

## 2014-08-04 NOTE — Telephone Encounter (Signed)
Patient is wanting to now if you can fill Lexapro 10 mg qd, Klonopin 1 mg 1/2 pill in the morning and 1 pill at bedtime, Wellbutrin 150 2 po qd , Adderall 20 mg qd. She did not need any t this time. Please advise      KP

## 2014-08-08 NOTE — Telephone Encounter (Signed)
noted 

## 2014-08-08 NOTE — Telephone Encounter (Signed)
She said she is good for now [facility] and did not need the refills at this time, she will call back when she is ready for her refill.     KP

## 2014-08-30 ENCOUNTER — Telehealth: Payer: Self-pay | Admitting: Family Medicine

## 2014-08-30 MED ORDER — AMPHETAMINE-DEXTROAMPHETAMINE 20 MG PO TABS: 20.0000 mg | ORAL_TABLET | Freq: Every day | ORAL | Status: DC

## 2014-08-30 NOTE — Telephone Encounter (Signed)
See 08/04/14 note. Rx printed, VM left advising it is ready for pick up.   KP

## 2014-08-30 NOTE — Addendum Note (Signed)
Addended by: Kelle Darting A on: 08/30/2014 01:54 PM   Modules accepted: Orders

## 2014-08-30 NOTE — Telephone Encounter (Signed)
Caller name:[place] Relationship to patient:SELF Can be reached:[phone] Pharmacy:[facility] ON [address]  Reason for call: AMPHETA-DEXTROCOMBO 20 MG 1 TIME PER DAY

## 2014-08-30 NOTE — Telephone Encounter (Signed)
Pt came to pick up Rx but Rx has not been signed by PCP. Rx reprinted and signed by NP, Inda Castle. Pt will sign CSC and provide UDS today.

## 2014-10-02 ENCOUNTER — Telehealth: Payer: Self-pay | Admitting: Family Medicine

## 2014-10-02 ENCOUNTER — Other Ambulatory Visit: Payer: Self-pay | Admitting: Family Medicine

## 2014-10-02 MED ORDER — AMPHETAMINE-DEXTROAMPHETAMINE 20 MG PO TABS: 20.0000 mg | ORAL_TABLET | Freq: Every day | ORAL | Status: DC

## 2014-10-02 NOTE — Telephone Encounter (Signed)
Patient aware Rx will be ready for pick up in the morning,     KP

## 2014-10-02 NOTE — Telephone Encounter (Signed)
Caller name: Tamma Brigandi Relationship to patient: self Can be reached: [phone]  Reason for call: Pt needing refill of adderall. Would like to pick up today around lunch time. Please call when ready. She has 1 pill left for today.

## 2014-10-02 NOTE — Telephone Encounter (Signed)
Pt apoligized, this is the first time she's needing a refill on this type of med. Notified again of 48-72 hr notification

## 2014-10-02 NOTE — Telephone Encounter (Signed)
Last seen 2.4.16. And never filled here, she was getting them form her previous provider.  Contract signed but No UDS on file   Please advise     KP

## 2014-10-02 NOTE — Telephone Encounter (Signed)
Refill x1 

## 2014-10-13 ENCOUNTER — Ambulatory Visit: Payer: BLUE CROSS/BLUE SHIELD | Admitting: Family Medicine

## 2014-10-24 ENCOUNTER — Ambulatory Visit: Payer: BLUE CROSS/BLUE SHIELD | Admitting: Family Medicine

## 2014-10-25 ENCOUNTER — Telehealth: Payer: Self-pay | Admitting: Family Medicine

## 2014-10-25 NOTE — Telephone Encounter (Signed)
Caller name: [place]  Relationship to patient: Self  Can be reached: [phone] Pharmacy:  Reason for call: pt says that she need 2 Rx filled.  1. [facility] -[facility] Phone# if needed: [phone] and also 2. Adderall.

## 2014-10-26 MED ORDER — AMPHETAMINE-DEXTROAMPHETAMINE 20 MG PO TABS: 20.0000 mg | ORAL_TABLET | Freq: Every day | ORAL | Status: DC

## 2014-10-26 MED ORDER — ESCITALOPRAM OXALATE 10 MG PO TABS: 10.0000 mg | ORAL_TABLET | Freq: Every day | ORAL | Status: DC

## 2014-10-26 NOTE — Telephone Encounter (Signed)
Message left advising Rx ready for pick up.     KP 

## 2014-11-07 ENCOUNTER — Telehealth: Payer: Self-pay | Admitting: Family Medicine

## 2014-11-07 ENCOUNTER — Ambulatory Visit (INDEPENDENT_AMBULATORY_CARE_PROVIDER_SITE_OTHER): Payer: BLUE CROSS/BLUE SHIELD | Admitting: Family Medicine

## 2014-11-07 ENCOUNTER — Encounter: Payer: Self-pay | Admitting: Family Medicine

## 2014-11-07 VITALS — BP 120/80 | HR 67 | Temp 98.6°F | Ht 67.0 in | Wt 227.4 lb

## 2014-11-07 DIAGNOSIS — F329 Major depressive disorder, single episode, unspecified: Secondary | ICD-10-CM

## 2014-11-07 DIAGNOSIS — F411 Generalized anxiety disorder: Secondary | ICD-10-CM | POA: Diagnosis not present

## 2014-11-07 DIAGNOSIS — F32A Depression, unspecified: Secondary | ICD-10-CM

## 2014-11-07 DIAGNOSIS — I10 Essential (primary) hypertension: Secondary | ICD-10-CM | POA: Diagnosis not present

## 2014-11-07 DIAGNOSIS — Z0000 Encounter for general adult medical examination without abnormal findings: Secondary | ICD-10-CM

## 2014-11-07 LAB — COMPREHENSIVE METABOLIC PANEL
ALBUMIN: 4 g/dL (ref 3.5–5.2)
ALK PHOS: 81 U/L (ref 39–117)
ALT: 14 U/L (ref 0–35)
AST: 15 U/L (ref 0–37)
BILIRUBIN TOTAL: 0.6 mg/dL (ref 0.2–1.2)
BUN: 11 mg/dL (ref 6–23)
CO2: 27 mEq/L (ref 19–32)
Calcium: 9.2 mg/dL (ref 8.4–10.5)
Chloride: 108 mEq/L (ref 96–112)
Creatinine, Ser: 1.12 mg/dL (ref 0.40–1.20)
GFR: 66.36 mL/min (ref >60.00)
Glucose, Bld: 76 mg/dL (ref 70–99)
Potassium: 4 mEq/L (ref 3.5–5.1)
Sodium: 139 mEq/L (ref 135–145)
TOTAL PROTEIN: 6.9 g/dL (ref 6.0–8.3)

## 2014-11-07 LAB — LIPID PANEL
CHOLESTEROL: 189 mg/dL (ref 0–200)
HDL: 52.4 mg/dL (ref >39.00)
LDL Cholesterol: 124 mg/dL — ABNORMAL HIGH (ref 0–99)
NonHDL: 137
Total CHOL/HDL Ratio: 4
Triglycerides: 64 mg/dL (ref 0.0–149.0)
VLDL: 12.8 mg/dL (ref 0.0–40.0)

## 2014-11-07 MED ORDER — CLONAZEPAM 1 MG PO TABS: ORAL_TABLET | ORAL | Status: DC

## 2014-11-07 MED ORDER — LABETALOL HCL 100 MG PO TABS: 100.0000 mg | ORAL_TABLET | Freq: Two times a day (BID) | ORAL | Status: DC

## 2014-11-07 MED ORDER — SPIRONOLACTONE 25 MG PO TABS: 25.0000 mg | ORAL_TABLET | Freq: Two times a day (BID) | ORAL | Status: DC

## 2014-11-07 MED ORDER — ESCITALOPRAM OXALATE 10 MG PO TABS: 10.0000 mg | ORAL_TABLET | Freq: Every day | ORAL | Status: DC

## 2014-11-07 MED ORDER — BUPROPION HCL ER (XL) 300 MG PO TB24: 300.0000 mg | ORAL_TABLET | Freq: Every day | ORAL | Status: DC

## 2014-11-07 NOTE — Telephone Encounter (Signed)
Caller name: Valborg Friar Relationship to patient: Self  Can be reached: [phone]  Pharmacy:  Reason for call: pt just left. She says that she have a question about her visit. Please call back .

## 2014-11-07 NOTE — Patient Instructions (Signed)

## 2014-11-07 NOTE — Progress Notes (Signed)
Patient ID: Cheryl Owens, female    DOB: Jun 19, 1965  Age: 49 y.o. MRN: [id number]    Subjective:  Subjective HPI Cheryl Owens presents for f/u bp and depression.   She is doing great and would like to stay on the meds.  No cp, no sob.    Review of Systems  Constitutional: Negative for diaphoresis, appetite change, fatigue and unexpected weight change.  Eyes: Negative for pain, redness and visual disturbance.  Respiratory: Negative for cough, chest tightness, shortness of breath and wheezing.   Cardiovascular: Negative for chest pain, palpitations and leg swelling.  Endocrine: Negative for cold intolerance, heat intolerance, polydipsia, polyphagia and polyuria.  Genitourinary: Negative for dysuria, frequency and difficulty urinating.  Neurological: Negative for dizziness, light-headedness, numbness and headaches.    History Past Medical History  Diagnosis Date  . Hypokalemia 2007    due to HCTZ  . HTN (hypertension) 2007    seen in ER  with headaches  . Depression   . Anxiety     She has past surgical history that includes arm surgery; Cholecystectomy; and Wisdom tooth extraction.   Her family history includes Bipolar disorder in her paternal aunt; Breast cancer in her maternal aunt; Cancer in her maternal aunt and maternal grandmother; Hyperlipidemia in her mother; Hypertension in her maternal aunt, maternal grandmother, mother, and paternal grandmother; Kidney disease in her maternal aunt; Prostate cancer in her father and paternal uncle. There is no history of Stroke, Diabetes, or Heart disease.She reports that she has never smoked. She does not have any smokeless tobacco history on file. She reports that she does not drink alcohol or use illicit drugs.  Current Outpatient Prescriptions on File Prior to Visit  Medication Sig Dispense Refill  . amphetamine-dextroamphetamine (ADDERALL) 20 MG tablet Take 1 tablet (20 mg total) by mouth daily. 30 tablet 0   No current  facility-administered medications on file prior to visit.     Objective:  Objective Physical Exam  Constitutional: She is oriented to person, place, and time. She appears well-developed and well-nourished.  HENT:  Head: Normocephalic and atraumatic.  Eyes: Conjunctivae and EOM are normal.  Neck: Normal range of motion. Neck supple. No JVD present. Carotid bruit is not present. No thyromegaly present.  Cardiovascular: Normal rate, regular rhythm and normal heart sounds.   No murmur heard. Pulmonary/Chest: Effort normal and breath sounds normal. No respiratory distress. She has no wheezes. She has no rales. She exhibits no tenderness.  Musculoskeletal: She exhibits no edema.  Neurological: She is alert and oriented to person, place, and time.  Psychiatric: She has a normal mood and affect. Her behavior is normal. Judgment and thought content normal.   BP 120/80 mmHg  Pulse 67  Temp(Src) 98.6 F (37 C) (Oral)  Ht _0  (1.702 m)  Wt 227 lb 6.4 oz (103.148 kg)  BMI 35.61 kg/m2  SpO2 98% Wt Readings from Last 3 Encounters:  11/07/14 227 lb 6.4 oz (103.148 kg)  04/13/14 224 lb 9.6 oz (101.878 kg)  09/28/13 233 lb 9.6 oz (105.96 kg)     Lab Results  Component Value Date   WBC 6.6 04/20/2014   HGB 12.3 04/20/2014   HCT 37.5 04/20/2014   PLT 276.0 04/20/2014   GLUCOSE 79 04/20/2014   CHOL 173 04/20/2014   TRIG 59.0 04/20/2014   HDL 48.00 04/20/2014   LDLCALC 113* 04/20/2014   ALT 15 04/20/2014   AST 15 04/20/2014   NA 138 04/20/2014   K 4.0 04/20/2014  CL 106 04/20/2014   CREATININE 1.18 04/20/2014   BUN 14 04/20/2014   CO2 25 04/20/2014   TSH 2.23 04/20/2014   HGBA1C 5.7 02/15/2013   MICROALBUR 3.1* 05/05/2007    Mm Digital Diagnostic Unilat R  03/27/2014   CLINICAL DATA:  Status post ultrasound-guided core biopsy of mass in the 7 o'clock location of the right breast.  EXAM: DIAGNOSTIC RIGHT MAMMOGRAM POST ULTRASOUND BIOPSY  COMPARISON:  Previous exams  FINDINGS:  Mammographic images were obtained following ultrasound guided biopsy of mass in the 7 o'clock location of the right breast. A ribbon shaped clip is identified in the lower outer quadrant of the right breast as expected.  IMPRESSION: Tissue marker clip is in expected location following biopsy.  Final Assessment: Post Procedure Mammograms for Marker Placement   Electronically Signed   By: Shon Hale M.D.   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 This was found to be concordant by Dr. Shon Hale. Pathology was discussed with the patient by telephone. She reported doing well after the biopsy. Post biopsy instructions and care were reviewed and her questions were answered. She was encouraged to call [facility] for any additional concerns. She was asked to return to [facility] for annual screening mammography.  Pathology results reported by Susa Raring RN, BSN on March 30, 2014.   Electronically Signed   By: Shon Hale M.D.   On: 03/30/2014 09:04   04/10/2014   CLINICAL DATA:  Patient presents for ultrasound-guided core biopsy of mass in the 7 o'clock location of the right breast.  EXAM: ULTRASOUND GUIDED RIGHT BREAST CORE NEEDLE BIOPSY  COMPARISON:  Previous exams.  PROCEDURE: I met with the patient and we discussed the procedure of ultrasound-guided biopsy, including benefits and alternatives. We discussed the high likelihood of a successful procedure. We discussed the risks of the procedure including infection, bleeding, tissue injury, clip migration, and inadequate sampling. Informed written consent was given. The usual time-out protocol was performed immediately prior to the procedure.  Using sterile technique and 2% Lidocaine as local anesthetic, under direct ultrasound  visualization, a 12 gauge vacuum-assisteddevice was used to perform biopsy of mass in the 7 o'clock location of the right breast using a call approach. At the conclusion of the procedure, a ribbon shaped tissue marker clip was deployed into the biopsy cavity. Follow-up 2-view mammogram was performed and dictated separately.  IMPRESSION: Ultrasound-guided biopsy of right breast mass. No apparent complications.  Electronically Signed: By: Shon Hale M.D. On: 03/27/2014 15:42     Assessment & Plan:  Plan I have discontinued Ms. Duvall's buPROPion. I am also having her start on buPROPion. Additionally, I am having her maintain her amphetamine-dextroamphetamine, escitalopram, clonazePAM, labetalol, and spironolactone.  Meds ordered this encounter  Medications  . escitalopram (LEXAPRO) 10 MG tablet    Sig: Take 1 tablet (10 mg total) by mouth daily.    Dispense:  90 tablet    Refill:  3  . buPROPion (WELLBUTRIN XL) 300 MG 24 hr tablet    Sig: Take 1 tablet (300 mg total) by mouth daily.    Dispense:  90 tablet    Refill:  3  . clonazePAM (KLONOPIN) 1 MG tablet    Sig: Take .5 in the morning  and 1 at bedtime    Dispense:  45 tablet    Refill:  1  . labetalol (NORMODYNE) 100 MG tablet    Sig: Take 1 tablet (100 mg total) by mouth 2 (two) times daily.    Dispense:  180 tablet    Refill:  3  . spironolactone (ALDACTONE) 25 MG tablet    Sig: Take 1 tablet (25 mg total) by mouth 2 (two) times daily.    Dispense:  180 tablet    Refill:  1    Problem List Items Addressed This Visit    None    Visit Diagnoses    Essential hypertension    -  Primary    Relevant Medications    labetalol (NORMODYNE) 100 MG tablet    spironolactone (ALDACTONE) 25 MG tablet    Depression        Relevant Medications    escitalopram (LEXAPRO) 10 MG tablet    buPROPion (WELLBUTRIN XL) 300 MG 24 hr tablet    Generalized anxiety disorder        Relevant Medications    clonazePAM (KLONOPIN) 1 MG tablet        Follow-up: Return in about 6 months (around 05/08/2015) for annual exam, fasting.  Garnet Koyanagi, DO

## 2014-11-07 NOTE — Telephone Encounter (Signed)
Spoke with patient and she wanted the HIV test to be removed, she stated she also wanted her Kl[facility]. I advised her to bring it in to [place] tomorrow and she can fax it since she will be covering. I will forward to [place].

## 2014-11-07 NOTE — Progress Notes (Signed)
Pre visit review using our clinic review tool, if applicable. No additional management support is needed unless otherwise documented below in the visit note. 

## 2014-11-09 MED ORDER — LABETALOL HCL 100 MG PO TABS: 100.0000 mg | ORAL_TABLET | Freq: Two times a day (BID) | ORAL | Status: DC

## 2014-11-09 MED ORDER — SPIRONOLACTONE 25 MG PO TABS: 25.0000 mg | ORAL_TABLET | Freq: Two times a day (BID) | ORAL | Status: DC

## 2014-11-09 NOTE — Telephone Encounter (Signed)
Patient brought back the script. Rx faxed.     KP

## 2014-12-04 ENCOUNTER — Telehealth: Payer: Self-pay | Admitting: Family Medicine

## 2014-12-04 MED ORDER — AMPHETAMINE-DEXTROAMPHETAMINE 20 MG PO TABS: 20.0000 mg | ORAL_TABLET | Freq: Every day | ORAL | Status: DC

## 2014-12-04 NOTE — Telephone Encounter (Signed)
Last refill 10/26/14 for 30 with 0 LOV: 11/07/14 UDS- 09/01/14- low risk  CSC- 08/30/14 Please advise.

## 2014-12-04 NOTE — Telephone Encounter (Signed)
Rx printed, awaiting MD signature.  

## 2014-12-04 NOTE — Telephone Encounter (Signed)
Relation to QZ:RAQT Call back number:[phone] Pharmacy:  Reason for call:  Patient requesting a refill amphetamine-dextroamphetamine (ADDERALL) 20 MG tablet

## 2014-12-04 NOTE — Telephone Encounter (Signed)
Refill x1--- can give 3 rx if would like but remind pt she will need to keep track of them

## 2014-12-04 NOTE — Telephone Encounter (Signed)
MD signed.  Placed at front desk, patient notified.

## 2015-01-11 ENCOUNTER — Other Ambulatory Visit: Payer: Self-pay | Admitting: Family Medicine

## 2015-01-11 MED ORDER — AMPHETAMINE-DEXTROAMPHETAMINE 20 MG PO TABS: 20.0000 mg | ORAL_TABLET | Freq: Every day | ORAL | Status: DC

## 2015-01-17 ENCOUNTER — Other Ambulatory Visit: Payer: Self-pay | Admitting: Obstetrics & Gynecology

## 2015-01-17 ENCOUNTER — Ambulatory Visit
Admission: RE | Admit: 2015-01-17 | Discharge: 2015-01-17 | Disposition: A | Discharge status: Home / Self Care | Payer: BLUE CROSS/BLUE SHIELD | Source: Ambulatory Visit | Attending: Obstetrics & Gynecology | Admitting: Obstetrics & Gynecology

## 2015-01-17 DIAGNOSIS — T8389XA Other specified complication of genitourinary prosthetic devices, implants and grafts, initial encounter: Secondary | ICD-10-CM

## 2015-01-26 ENCOUNTER — Other Ambulatory Visit: Payer: Self-pay | Admitting: Family Medicine

## 2015-02-14 ENCOUNTER — Other Ambulatory Visit: Payer: Self-pay | Admitting: Family Medicine

## 2015-02-14 MED ORDER — AMPHETAMINE-DEXTROAMPHETAMINE 20 MG PO TABS: 20.0000 mg | ORAL_TABLET | Freq: Every day | ORAL | Status: DC

## 2015-02-14 NOTE — Telephone Encounter (Signed)
Okay #30, no refills 

## 2015-02-14 NOTE — Telephone Encounter (Signed)
Rx placed at front desk for pick up.  

## 2015-02-14 NOTE — Telephone Encounter (Signed)
Rx printed, awaiting MD signature.  

## 2015-02-14 NOTE — Telephone Encounter (Signed)
Pt is requesting refill on Adderall. [facility] Pt.  Last OV: 11/07/2014 Last Fill: 01/11/2015 #30 and 0RF UDS: 08/30/2014 Low risk  Please advise.

## 2015-02-22 ENCOUNTER — Encounter: Payer: Self-pay | Admitting: Family Medicine

## 2015-02-22 DIAGNOSIS — F411 Generalized anxiety disorder: Secondary | ICD-10-CM

## 2015-02-22 MED ORDER — CLONAZEPAM 1 MG PO TABS: ORAL_TABLET | ORAL | Status: DC

## 2015-02-22 NOTE — Telephone Encounter (Signed)
Last seen and filled 11/07/14 #45 with 1 refill   Please advise    KP

## 2015-03-13 ENCOUNTER — Encounter: Payer: Self-pay | Admitting: Family Medicine

## 2015-03-13 MED ORDER — AMPHETAMINE-DEXTROAMPHETAMINE 20 MG PO TABS: 20.0000 mg | ORAL_TABLET | Freq: Every day | ORAL | Status: DC

## 2015-03-22 ENCOUNTER — Other Ambulatory Visit: Payer: Self-pay

## 2015-03-22 DIAGNOSIS — F411 Generalized anxiety disorder: Secondary | ICD-10-CM

## 2015-03-22 MED ORDER — ESCITALOPRAM OXALATE 10 MG PO TABS: 10.0000 mg | ORAL_TABLET | Freq: Every day | ORAL | Status: DC

## 2015-03-22 MED ORDER — CLONAZEPAM 1 MG PO TABS: ORAL_TABLET | ORAL | Status: DC

## 2015-03-22 NOTE — Telephone Encounter (Signed)
Last seen 11/07/14 and filled 02/22/15 #45 with 1

## 2015-04-19 ENCOUNTER — Encounter: Payer: Self-pay | Admitting: Family Medicine

## 2015-04-19 ENCOUNTER — Ambulatory Visit (INDEPENDENT_AMBULATORY_CARE_PROVIDER_SITE_OTHER): Payer: BLUE CROSS/BLUE SHIELD | Admitting: Family Medicine

## 2015-04-19 VITALS — BP 120/80 | HR 84 | Temp 98.5°F | Ht 67.0 in | Wt 246.4 lb

## 2015-04-19 DIAGNOSIS — F32A Depression, unspecified: Secondary | ICD-10-CM | POA: Insufficient documentation

## 2015-04-19 DIAGNOSIS — F988 Other specified behavioral and emotional disorders with onset usually occurring in childhood and adolescence: Secondary | ICD-10-CM

## 2015-04-19 DIAGNOSIS — I10 Essential (primary) hypertension: Secondary | ICD-10-CM

## 2015-04-19 DIAGNOSIS — F909 Attention-deficit hyperactivity disorder, unspecified type: Secondary | ICD-10-CM | POA: Diagnosis not present

## 2015-04-19 DIAGNOSIS — F329 Major depressive disorder, single episode, unspecified: Secondary | ICD-10-CM | POA: Diagnosis not present

## 2015-04-19 LAB — COMPREHENSIVE METABOLIC PANEL
ALT: 15 U/L (ref 0–35)
AST: 16 U/L (ref 0–37)
Albumin: 4 g/dL (ref 3.5–5.2)
Alkaline Phosphatase: 87 U/L (ref 39–117)
BUN: 11 mg/dL (ref 6–23)
CALCIUM: 9.3 mg/dL (ref 8.4–10.5)
CHLORIDE: 105 meq/L (ref 96–112)
CO2: 28 meq/L (ref 19–32)
CREATININE: 1.24 mg/dL — AB (ref 0.40–1.20)
GFR: 58.9 mL/min — ABNORMAL LOW (ref >60.00)
Glucose, Bld: 77 mg/dL (ref 70–99)
Potassium: 3.9 mEq/L (ref 3.5–5.1)
SODIUM: 139 meq/L (ref 135–145)
Total Bilirubin: 0.6 mg/dL (ref 0.2–1.2)
Total Protein: 7.2 g/dL (ref 6.0–8.3)

## 2015-04-19 LAB — LIPID PANEL
CHOL/HDL RATIO: 3
CHOLESTEROL: 184 mg/dL (ref 0–200)
HDL: 53.7 mg/dL (ref >39.00)
LDL Cholesterol: 119 mg/dL — ABNORMAL HIGH (ref 0–99)
NONHDL: 130.36
TRIGLYCERIDES: 59 mg/dL (ref 0.0–149.0)
VLDL: 11.8 mg/dL (ref 0.0–40.0)

## 2015-04-19 LAB — HEMOGLOBIN A1C: Hgb A1c MFr Bld: 5.4 % (ref 4.6–6.5)

## 2015-04-19 MED ORDER — SPIRONOLACTONE 25 MG PO TABS
25.0000 mg | ORAL_TABLET | Freq: Two times a day (BID) | ORAL | Status: DC
Start: 2015-04-19 — End: 2015-08-09

## 2015-04-19 MED ORDER — AMPHETAMINE-DEXTROAMPHETAMINE 20 MG PO TABS: 20.0000 mg | ORAL_TABLET | Freq: Every day | ORAL | Status: DC

## 2015-04-19 MED ORDER — ESCITALOPRAM OXALATE 10 MG PO TABS: 10.0000 mg | ORAL_TABLET | Freq: Every day | ORAL | Status: DC

## 2015-04-19 NOTE — Progress Notes (Signed)
Pre visit review using our clinic review tool, if applicable. No additional management support is needed unless otherwise documented below in the visit note. 

## 2015-04-19 NOTE — Patient Instructions (Signed)
Major Depressive Disorder Major depressive disorder is a mental illness. It also may be called clinical depression or unipolar depression. Major depressive disorder usually causes feelings of sadness, hopelessness, or helplessness. Some people with this disorder do not feel particularly sad but lose interest in doing things they used to enjoy (anhedonia). Major depressive disorder also can cause physical symptoms. It can interfere with work, school, relationships, and other normal everyday activities. The disorder varies in severity but is longer lasting and more serious than the sadness we all feel from time to time in our lives. Major depressive disorder often is triggered by stressful life events or major life changes. Examples of these triggers include divorce, loss of your job or home, a move, and the death of a family member or close friend. Sometimes this disorder occurs for no obvious reason at all. People who have family members with major depressive disorder or bipolar disorder are at higher risk for developing this disorder, with or without life stressors. Major depressive disorder can occur at any age. It may occur just once in your life (single episode major depressive disorder). It may occur multiple times (recurrent major depressive disorder). SYMPTOMS People with major depressive disorder have either anhedonia or depressed mood on nearly a daily basis for at least 2 weeks or longer. Symptoms of depressed mood include:  Feelings of sadness (blue or down in the dumps) or emptiness.  Feelings of hopelessness or helplessness.  Tearfulness or episodes of crying (may be observed by others).  Irritability (children and adolescents). In addition to depressed mood or anhedonia or both, people with this disorder have at least four of the following symptoms:  Difficulty sleeping or sleeping too much.   Significant change (increase or decrease) in appetite or weight.   Lack of energy or  motivation.  Feelings of guilt and worthlessness.   Difficulty concentrating, remembering, or making decisions.  Unusually slow movement (psychomotor retardation) or restlessness (as observed by others).   Recurrent wishes for death, recurrent thoughts of self-harm (suicide), or a suicide attempt. People with major depressive disorder commonly have persistent negative thoughts about themselves, other people, and the world. People with severe major depressive disorder may experiencedistorted beliefs or perceptions about the world (psychotic delusions). They also may see or hear things that are not real (psychotic hallucinations). DIAGNOSIS Major depressive disorder is diagnosed through an assessment by your health care provider. Your health care provider will ask aboutaspects of your daily life, such as mood,sleep, and appetite, to see if you have the diagnostic symptoms of major depressive disorder. Your health care provider may ask about your medical history and use of alcohol or drugs, including prescription medicines. Your health care provider also may do a physical exam and blood work. This is because certain medical conditions and the use of certain substances can cause major depressive disorder-like symptoms (secondary depression). Your health care provider also may refer you to a mental health specialist for further evaluation and treatment. TREATMENT It is important to recognize the symptoms of major depressive disorder and seek treatment. The following treatments can be prescribed for this disorder:   Medicine. Antidepressant medicines usually are prescribed. Antidepressant medicines are thought to correct chemical imbalances in the brain that are commonly associated with major depressive disorder. Other types of medicine may be added if the symptoms do not respond to antidepressant medicines alone or if psychotic delusions or hallucinations occur.  Talk therapy. Talk therapy can be  helpful in treating major depressive disorder by providing   support, education, and guidance. Certain types of talk therapy also can help with negative thinking (cognitive behavioral therapy) and with relationship issues that trigger this disorder (interpersonal therapy). A mental health specialist can help determine which treatment is best for you. Most people with major depressive disorder do well with a combination of medicine and talk therapy. Treatments involving electrical stimulation of the brain can be used in situations with extremely severe symptoms or when medicine and talk therapy do not work over time. These treatments include electroconvulsive therapy, transcranial magnetic stimulation, and vagal nerve stimulation.   This information is not intended to replace advice given to you by your health care provider. Make sure you discuss any questions you have with your health care provider.   Document Released: 06/21/2012 Document Revised: 03/17/2014 Document Reviewed: 06/21/2012 Elsevier Interactive Patient Education 2016 [employer]  

## 2015-04-19 NOTE — Assessment & Plan Note (Signed)
Stable--- f/u psych con't meds

## 2015-04-19 NOTE — Progress Notes (Signed)
Patient ID: Cheryl Owens, female    DOB: 12/07/1965  Age: 50 y.o. MRN: [id number]    Subjective:  Subjective HPI Cheryl Owens presents for htn f/u and f/u add and depression.  She was seeing psych but because she changed her psychologist the psych will not see her anymore.  She has an appointment with a new psychiatrist next week but she needs new refills.   Review of Systems  Constitutional: Negative for diaphoresis, appetite change, fatigue and unexpected weight change.  Eyes: Negative for pain, redness and visual disturbance.  Respiratory: Negative for cough, chest tightness, shortness of breath and wheezing.   Cardiovascular: Negative for chest pain, palpitations and leg swelling.  Endocrine: Negative for cold intolerance, heat intolerance, polydipsia, polyphagia and polyuria.  Genitourinary: Negative for dysuria, frequency and difficulty urinating.  Neurological: Negative for dizziness, light-headedness, numbness and headaches.  Psychiatric/Behavioral: Positive for dysphoric mood and decreased concentration.    History Past Medical History  Diagnosis Date  . Hypokalemia 2007    due to HCTZ  . HTN (hypertension) 2007    seen in ER  with headaches  . Depression   . Anxiety     She has past surgical history that includes arm surgery; Cholecystectomy; and Wisdom tooth extraction.   Her family history includes Bipolar disorder in her paternal aunt; Breast cancer in her maternal aunt; Cancer in her maternal aunt and maternal grandmother; Hyperlipidemia in her mother; Hypertension in her maternal aunt, maternal grandmother, mother, and paternal grandmother; Kidney disease in her maternal aunt; Prostate cancer in her father and paternal uncle. There is no history of Stroke, Diabetes, or Heart disease.She reports that she has never smoked. She does not have any smokeless tobacco history on file. She reports that she does not drink alcohol or use illicit drugs.  Current Outpatient  Prescriptions on File Prior to Visit  Medication Sig Dispense Refill  . buPROPion (WELLBUTRIN XL) 300 MG 24 hr tablet Take 1 tablet (300 mg total) by mouth daily. 90 tablet 3  . clonazePAM (KLONOPIN) 1 MG tablet Take .5 in the morning and 1 at bedtime 45 tablet 1  . labetalol (NORMODYNE) 100 MG tablet Take 1 tablet (100 mg total) by mouth 2 (two) times daily. 180 tablet 3   No current facility-administered medications on file prior to visit.     Objective:  Objective Physical Exam  Constitutional: She is oriented to person, place, and time. She appears well-developed and well-nourished.  HENT:  Head: Normocephalic and atraumatic.  Eyes: Conjunctivae and EOM are normal.  Neck: Normal range of motion. Neck supple. No JVD present. Carotid bruit is not present. No thyromegaly present.  Cardiovascular: Normal rate, regular rhythm and normal heart sounds.   No murmur heard. Pulmonary/Chest: Effort normal and breath sounds normal. No respiratory distress. She has no wheezes. She has no rales. She exhibits no tenderness.  Musculoskeletal: She exhibits no edema.  Neurological: She is alert and oriented to person, place, and time.  Psychiatric: She has a normal mood and affect.  Nursing note and vitals reviewed.  BP 120/80 mmHg  Pulse 84  Temp(Src) 98.5 F (36.9 C) (Oral)  Ht 5\' 7"  (1.702 m)  Wt 246 lb 6.4 oz (111.766 kg)  BMI 38.58 kg/m2  SpO2 98%  LMP 04/09/2015 Wt Readings from Last 3 Encounters:  04/19/15 246 lb 6.4 oz (111.766 kg)  11/07/14 227 lb 6.4 oz (103.148 kg)  04/13/14 224 lb 9.6 oz (101.878 kg)     Lab Results  Component Value Date   WBC 6.6 04/20/2014   HGB 12.3 04/20/2014   HCT 37.5 04/20/2014   PLT 276.0 04/20/2014   GLUCOSE 76 11/07/2014   CHOL 189 11/07/2014   TRIG 64.0 11/07/2014   HDL 52.40 11/07/2014   LDLCALC 124* 11/07/2014   ALT 14 11/07/2014   AST 15 11/07/2014   NA 139 11/07/2014   K 4.0 11/07/2014   CL 108 11/07/2014   CREATININE 1.12  11/07/2014   BUN 11 11/07/2014   CO2 27 11/07/2014   TSH 2.23 04/20/2014   HGBA1C 5.7 02/15/2013   MICROALBUR 3.1* 05/05/2007    Dg Abd 1 View  01/17/2015  CLINICAL DATA:  Evaluate for lost IUD. EXAM: ABDOMEN - 1 VIEW COMPARISON:  Abdominal series 8 04/2005. FINDINGS: Surgical clips noted over the upper abdomen pelvis. IUD is noted projected over the mid pelvis. Pelvic calcifications consistent phleboliths. Stool noted throughout colon. No acute bony abnormality . IMPRESSION: IUD noted over the mid pelvis. Electronically Signed   By: Marcello Moores  Register   On: 01/17/2015 12:10     Assessment & Plan:  Plan I am having Ms. Kruse maintain her buPROPion, labetalol, clonazePAM, [name], amphetamine-dextroamphetamine, spironolactone, and escitalopram.  Meds ordered this encounter  Medications  . [name] 0.35 MG tablet    Sig: Take 1 tablet by mouth daily.  Marland Kitchen amphetamine-dextroamphetamine (ADDERALL) 20 MG tablet    Sig: Take 1 tablet (20 mg total) by mouth daily.    Dispense:  30 tablet    Refill:  0  . spironolactone (ALDACTONE) 25 MG tablet    Sig: Take 1 tablet (25 mg total) by mouth 2 (two) times daily.    Dispense:  180 tablet    Refill:  1  . escitalopram (LEXAPRO) 10 MG tablet    Sig: Take 1 tablet (10 mg total) by mouth daily.    Dispense:  90 tablet    Refill:  1    Problem List Items Addressed This Visit      Unprioritized   Essential hypertension - Primary    con't labetolol and spironolactone stable      Relevant Medications   spironolactone (ALDACTONE) 25 MG tablet   Depression    Stable--- f/u psych con't meds       Relevant Medications   escitalopram (LEXAPRO) 10 MG tablet    Other Visit Diagnoses    ADD (attention deficit disorder)        Relevant Medications    amphetamine-dextroamphetamine (ADDERALL) 20 MG tablet       Follow-up: Return in about 6 months (around 10/17/2015), or if symptoms worsen or fail to improve, for hypertension, depression  .  Garnet Koyanagi, DO

## 2015-04-19 NOTE — Assessment & Plan Note (Signed)
con't labetolol and spironolactone stable

## 2015-05-25 ENCOUNTER — Telehealth: Payer: Self-pay | Admitting: Family Medicine

## 2015-05-25 DIAGNOSIS — F411 Generalized anxiety disorder: Secondary | ICD-10-CM

## 2015-05-25 MED ORDER — CLONAZEPAM 1 MG PO TABS: ORAL_TABLET | ORAL | Status: DC

## 2015-05-25 NOTE — Telephone Encounter (Signed)
Patient called needing a refill locally for clonazepam she is completely out of medication and she [facility] patient states she lost on bottle and cannot find it but completely out of this medication and wants to pick up a rx today if possibe and wants additional [facility]ills [facility] for future refillls. Please call the patient when rx is ready [phone]

## 2015-05-25 NOTE — Telephone Encounter (Signed)
Rx faxed.    KP 

## 2015-05-25 NOTE — Telephone Encounter (Signed)
#  45 only

## 2015-05-25 NOTE — Telephone Encounter (Signed)
Last seen 04/19/15 and filled 1/12.17 #45 with 1 refill.    Please advise    KP

## 2015-05-25 NOTE — Telephone Encounter (Signed)
Relation to [id number] Call back number:[phone] Pharmacy: [facility] [address], [place] [phone] (Phone) [phone] (Fax)         Reason for call:  Patient requesting a few pills of clonazePAM (KLONOPIN) 1 MG tablet to hold her over until mail orders is received.

## 2015-06-28 ENCOUNTER — Other Ambulatory Visit: Payer: Self-pay | Admitting: Family Medicine

## 2015-06-28 ENCOUNTER — Encounter: Payer: Self-pay | Admitting: Family Medicine

## 2015-06-28 DIAGNOSIS — Z0000 Encounter for general adult medical examination without abnormal findings: Secondary | ICD-10-CM

## 2015-06-29 ENCOUNTER — Encounter: Payer: Self-pay | Admitting: Internal Medicine

## 2015-07-10 ENCOUNTER — Other Ambulatory Visit: Payer: Self-pay

## 2015-07-10 DIAGNOSIS — F411 Generalized anxiety disorder: Secondary | ICD-10-CM

## 2015-07-10 MED ORDER — CLONAZEPAM 1 MG PO TABS: ORAL_TABLET | ORAL | Status: DC

## 2015-07-10 NOTE — Telephone Encounter (Signed)
Last seen 04/19/15 and filled 05/25/15 #45  Please advise    KP

## 2015-07-31 ENCOUNTER — Other Ambulatory Visit: Payer: Self-pay | Admitting: Family Medicine

## 2015-08-09 ENCOUNTER — Other Ambulatory Visit: Payer: Self-pay | Admitting: Family Medicine

## 2015-08-23 ENCOUNTER — Telehealth: Payer: Self-pay | Admitting: Family Medicine

## 2015-08-23 MED ORDER — SPIRONOLACTONE 25 MG PO TABS: 25.0000 mg | ORAL_TABLET | Freq: Two times a day (BID) | ORAL | Status: DC

## 2015-08-23 NOTE — Telephone Encounter (Signed)
Pt says that she has a Rx for [facility]. She says that she will be out in a day or so and will not receive Rx before she's out. She would like to know if PCP could call her in a few to her local pharmacy?     Pharmacy: [place]: [phone]

## 2015-08-23 NOTE — Telephone Encounter (Signed)
10 day supply sent to pharmacy, notified pt.

## 2015-08-24 ENCOUNTER — Other Ambulatory Visit: Payer: Self-pay

## 2015-08-24 DIAGNOSIS — F411 Generalized anxiety disorder: Secondary | ICD-10-CM

## 2015-08-24 MED ORDER — CLONAZEPAM 1 MG PO TABS: ORAL_TABLET | ORAL | Status: DC

## 2015-09-06 ENCOUNTER — Encounter: Payer: BLUE CROSS/BLUE SHIELD | Admitting: Internal Medicine

## 2015-10-01 ENCOUNTER — Ambulatory Visit (AMBULATORY_SURGERY_CENTER): Payer: Self-pay | Admitting: *Deleted

## 2015-10-01 VITALS — Ht 67.0 in | Wt 262.0 lb

## 2015-10-01 DIAGNOSIS — Z1211 Encounter for screening for malignant neoplasm of colon: Secondary | ICD-10-CM

## 2015-10-01 MED ORDER — NA SULFATE-K SULFATE-MG SULF 17.5-3.13-1.6 GM/177ML PO SOLN: 1.0000 | Freq: Once | ORAL | 0 refills | Status: AC

## 2015-10-01 NOTE — Progress Notes (Signed)
No egg or soy allergy. No anesthesia problems.  No home O2.  No diet meds.  

## 2015-10-02 ENCOUNTER — Encounter: Payer: Self-pay | Admitting: Internal Medicine

## 2015-10-03 DIAGNOSIS — F322 Major depressive disorder, single episode, severe without psychotic features: Secondary | ICD-10-CM | POA: Diagnosis not present

## 2015-10-08 ENCOUNTER — Telehealth: Payer: Self-pay | Admitting: Internal Medicine

## 2015-10-08 NOTE — Telephone Encounter (Signed)
No

## 2015-10-10 ENCOUNTER — Encounter: Payer: BLUE CROSS/BLUE SHIELD | Admitting: Internal Medicine

## 2015-11-09 ENCOUNTER — Other Ambulatory Visit: Payer: Self-pay | Admitting: Family Medicine

## 2015-11-09 DIAGNOSIS — F32A Depression, unspecified: Secondary | ICD-10-CM

## 2015-11-09 DIAGNOSIS — F329 Major depressive disorder, single episode, unspecified: Secondary | ICD-10-CM

## 2015-11-13 ENCOUNTER — Other Ambulatory Visit: Payer: Self-pay

## 2015-11-13 DIAGNOSIS — F411 Generalized anxiety disorder: Secondary | ICD-10-CM

## 2015-11-13 MED ORDER — CLONAZEPAM 1 MG PO TABS: ORAL_TABLET | ORAL | 0 refills | Status: DC

## 2015-11-13 NOTE — Telephone Encounter (Signed)
Last seen 04/19/15 and filled 08/24/15 #45   Please advise    KP

## 2015-11-28 ENCOUNTER — Encounter: Payer: BLUE CROSS/BLUE SHIELD | Admitting: Internal Medicine

## 2016-01-09 ENCOUNTER — Other Ambulatory Visit: Payer: Self-pay | Admitting: Family Medicine

## 2016-01-09 DIAGNOSIS — I10 Essential (primary) hypertension: Secondary | ICD-10-CM

## 2016-01-09 NOTE — Telephone Encounter (Signed)
Labetalol refill sent to [facility]. Lexapro refill on hold. Medication removed from med list 10/01/15 as "pt preference". Left detailed message on pt's voicemail to call and let us know if she is requesting this refill.

## 2016-01-29 DIAGNOSIS — F322 Major depressive disorder, single episode, severe without psychotic features: Secondary | ICD-10-CM | POA: Diagnosis not present

## 2016-01-30 DIAGNOSIS — Z1211 Encounter for screening for malignant neoplasm of colon: Secondary | ICD-10-CM | POA: Diagnosis not present

## 2016-01-30 NOTE — Telephone Encounter (Signed)
Left detailed message on pt's voicemail to call and verify request for Lexapro due to below reason.

## 2016-02-07 NOTE — Telephone Encounter (Signed)
Spoke with pt, pt states that she didn't request for medication to be refilled because she gets them prescribed by some else. Pt had no further questions or concerns. LB

## 2016-02-28 ENCOUNTER — Telehealth: Payer: Self-pay | Admitting: Family Medicine

## 2016-02-28 ENCOUNTER — Other Ambulatory Visit: Payer: Self-pay

## 2016-02-28 DIAGNOSIS — I10 Essential (primary) hypertension: Secondary | ICD-10-CM

## 2016-02-28 MED ORDER — SPIRONOLACTONE 25 MG PO TABS: 25.0000 mg | ORAL_TABLET | Freq: Two times a day (BID) | ORAL | 0 refills | Status: DC

## 2016-02-28 MED ORDER — LABETALOL HCL 100 MG PO TABS: 100.0000 mg | ORAL_TABLET | Freq: Two times a day (BID) | ORAL | 1 refills | Status: DC

## 2016-02-28 MED ORDER — SPIRONOLACTONE 25 MG PO TABS
25.0000 mg | ORAL_TABLET | Freq: Two times a day (BID) | ORAL | 0 refills | Status: DC
Start: 2016-02-28 — End: 2016-02-28

## 2016-02-28 NOTE — Telephone Encounter (Signed)
Caller name: Adaliah Marana Relationship to patient: self Can be reached: [phone] (cell/best #) or [phone] (work) Pharmacy: [facility]  Reason for call: Pt needing refill on labetalol and Spironolactone. She only has 10 days left and is needing thru mail order pharmacy so it needs sent now if possible. Pt states that the order 11/1 was cancelled due to confusion on prescribing MD. Her psychiatrist will fill only mental health meds. Other 2 need to be from Dr. Etter Sjogren. She is requesting call back today to notify meds are sent.

## 2016-02-28 NOTE — Telephone Encounter (Signed)
Pt notified Rx was sent to pharmacy. LB

## 2016-02-28 NOTE — Telephone Encounter (Signed)
Rx sent to pharmacy. LB 

## 2016-03-04 ENCOUNTER — Encounter: Payer: Self-pay | Admitting: Family Medicine

## 2016-03-04 ENCOUNTER — Ambulatory Visit (INDEPENDENT_AMBULATORY_CARE_PROVIDER_SITE_OTHER): Payer: BLUE CROSS/BLUE SHIELD | Admitting: Family Medicine

## 2016-03-04 VITALS — BP 110/78 | HR 72 | Temp 98.0°F | Resp 16 | Ht 67.0 in | Wt 263.8 lb

## 2016-03-04 DIAGNOSIS — F320 Major depressive disorder, single episode, mild: Secondary | ICD-10-CM

## 2016-03-04 DIAGNOSIS — I10 Essential (primary) hypertension: Secondary | ICD-10-CM

## 2016-03-04 DIAGNOSIS — F411 Generalized anxiety disorder: Secondary | ICD-10-CM

## 2016-03-04 DIAGNOSIS — F988 Other specified behavioral and emotional disorders with onset usually occurring in childhood and adolescence: Secondary | ICD-10-CM | POA: Diagnosis not present

## 2016-03-04 DIAGNOSIS — Z0000 Encounter for general adult medical examination without abnormal findings: Secondary | ICD-10-CM | POA: Diagnosis not present

## 2016-03-04 DIAGNOSIS — Z79899 Other long term (current) drug therapy: Secondary | ICD-10-CM | POA: Diagnosis not present

## 2016-03-04 LAB — CBC WITH DIFFERENTIAL/PLATELET
BASOS ABS: 0 10*3/uL (ref 0.0–0.1)
Basophils Relative: 0.4 % (ref 0.0–3.0)
EOS ABS: 0.1 10*3/uL (ref 0.0–0.7)
Eosinophils Relative: 1.5 % (ref 0.0–5.0)
HCT: 37.1 % (ref 36.0–46.0)
Hemoglobin: 12.5 g/dL (ref 12.0–15.0)
LYMPHS PCT: 19 % (ref 12.0–46.0)
Lymphs Abs: 1.8 10*3/uL (ref 0.7–4.0)
MCHC: 33.7 g/dL (ref 30.0–36.0)
MCV: 82.5 fl (ref 78.0–100.0)
MONOS PCT: 7.1 % (ref 3.0–12.0)
Monocytes Absolute: 0.7 10*3/uL (ref 0.1–1.0)
NEUTROS PCT: 72 % (ref 43.0–77.0)
Neutro Abs: 6.7 10*3/uL (ref 1.4–7.7)
PLATELETS: 281 10*3/uL (ref 150.0–400.0)
RBC: 4.49 Mil/uL (ref 3.87–5.11)
RDW: 14.9 % (ref 11.5–15.5)
WBC: 9.3 10*3/uL (ref 4.0–10.5)

## 2016-03-04 LAB — POCT URINALYSIS DIPSTICK
BILIRUBIN UA: NEGATIVE
Glucose, UA: NEGATIVE
Ketones, UA: NEGATIVE
LEUKOCYTES UA: NEGATIVE
NITRITE UA: NEGATIVE
PH UA: 6
Protein, UA: NEGATIVE
Spec Grav, UA: 1.015
UROBILINOGEN UA: NEGATIVE

## 2016-03-04 LAB — LIPID PANEL
CHOL/HDL RATIO: 4
CHOLESTEROL: 194 mg/dL (ref 0–200)
HDL: 48.3 mg/dL (ref >39.00)
LDL CALC: 131 mg/dL — AB (ref 0–99)
NonHDL: 145.56
Triglycerides: 72 mg/dL (ref 0.0–149.0)
VLDL: 14.4 mg/dL (ref 0.0–40.0)

## 2016-03-04 LAB — COMPREHENSIVE METABOLIC PANEL
ALBUMIN: 4 g/dL (ref 3.5–5.2)
ALT: 16 U/L (ref 0–35)
AST: 18 U/L (ref 0–37)
Alkaline Phosphatase: 94 U/L (ref 39–117)
BUN: 13 mg/dL (ref 6–23)
CHLORIDE: 106 meq/L (ref 96–112)
CO2: 23 mEq/L (ref 19–32)
CREATININE: 1.22 mg/dL — AB (ref 0.40–1.20)
Calcium: 9.4 mg/dL (ref 8.4–10.5)
GFR: 59.8 mL/min — ABNORMAL LOW (ref >60.00)
GLUCOSE: 100 mg/dL — AB (ref 70–99)
POTASSIUM: 4 meq/L (ref 3.5–5.1)
SODIUM: 137 meq/L (ref 135–145)
Total Bilirubin: 0.4 mg/dL (ref 0.2–1.2)
Total Protein: 7.1 g/dL (ref 6.0–8.3)

## 2016-03-04 LAB — TSH: TSH: 1.15 u[IU]/mL (ref 0.35–4.50)

## 2016-03-04 MED ORDER — NONFORMULARY OR COMPOUNDED ITEM: 11 refills | Status: DC

## 2016-03-04 MED ORDER — SPIRONOLACTONE 25 MG PO TABS: 25.0000 mg | ORAL_TABLET | Freq: Two times a day (BID) | ORAL | 0 refills | Status: DC

## 2016-03-04 MED ORDER — LABETALOL HCL 100 MG PO TABS: 100.0000 mg | ORAL_TABLET | Freq: Two times a day (BID) | ORAL | 1 refills | Status: DC

## 2016-03-04 MED ORDER — BUPROPION HCL ER (XL) 300 MG PO TB24: 300.0000 mg | ORAL_TABLET | Freq: Every day | ORAL | 0 refills | Status: DC

## 2016-03-04 MED ORDER — CLONAZEPAM 1 MG PO TABS: ORAL_TABLET | ORAL | 0 refills | Status: DC

## 2016-03-04 MED ORDER — NONFORMULARY OR COMPOUNDED ITEM: 0 refills | Status: DC

## 2016-03-04 NOTE — Patient Instructions (Signed)

## 2016-03-04 NOTE — Progress Notes (Signed)
Pre visit review using our clinic review tool, if applicable. No additional management support is needed unless otherwise documented below in the visit note. 

## 2016-03-04 NOTE — Progress Notes (Signed)
Subjective:     Cheryl Owens is a 50 y.o. female and is here for a comprehensive physical exam. The patient reports problems - weight gain-- pt will start working out with trainer -- she received training at [employer] .  Social History   Social History  . Marital status: Single    Spouse name: N/A  . Number of children: N/A  . Years of education: N/A   Occupational History  . Not on file.   Social History Main Topics  . Smoking status: Never Smoker  . Smokeless tobacco: Never Used  . Alcohol use No  . Drug use: No  . Sexual activity: No   Other Topics Concern  . Not on file   Social History Narrative  . No narrative on file   Health Maintenance  Topic Date Due  . HIV Screening  05/11/1980  . MAMMOGRAM  03/28/2015  . COLONOSCOPY  05/12/2015  . PAP SMEAR  02/14/2017  . TETANUS/TDAP  09/29/2018  . INFLUENZA VACCINE  Completed    The following portions of the patient's history were reviewed and updated as appropriate: She  has a past medical history of Anxiety; Depression; HTN (hypertension) (2007); Hypertension; and Hypokalemia (2007). She  does not have any pertinent problems on file. She  has a past surgical history that includes arm surgery; Cholecystectomy; and Wisdom tooth extraction. Her family history includes Bipolar disorder in her paternal aunt; Breast cancer in her maternal aunt; Cancer in her maternal aunt and maternal grandmother; Hyperlipidemia in her mother; Hypertension in her maternal aunt, maternal grandmother, mother, and paternal grandmother; Kidney disease in her maternal aunt; Prostate cancer in her father and paternal uncle. She  reports that she has never smoked. She has never used smokeless tobacco. She reports that she does not drink alcohol or use drugs. She has a current medication list which includes the following prescription(s): bupropion, clonazepam, heather, labetalol, spironolactone, amphetamine-dextroamphetamine, NONFORMULARY OR COMPOUNDED  ITEM, and NONFORMULARY OR COMPOUNDED ITEM. Current Outpatient Prescriptions on File Prior to Visit  Medication Sig Dispense Refill  . buPROPion (WELLBUTRIN XL) 300 MG 24 hr tablet Take 1 tablet (300 mg total) by mouth daily. Office visit due now 90 tablet 0  . clonazePAM (KLONOPIN) 1 MG tablet Take .5 in the morning and 1 at bedtime 45 tablet 0  . [name] 0.35 MG tablet Take 1 tablet by mouth daily.    Marland Kitchen labetalol (NORMODYNE) 100 MG tablet Take 1 tablet (100 mg total) by mouth 2 (two) times daily. 180 tablet 1  . spironolactone (ALDACTONE) 25 MG tablet Take 1 tablet (25 mg total) by mouth 2 (two) times daily. 30 tablet 0   No current facility-administered medications on file prior to visit.    She is allergic to hydrochlorothiazide and benazepril..  Review of Systems Review of Systems  Constitutional: Negative for activity change, appetite change and fatigue.  HENT: Negative for hearing loss, congestion, tinnitus and ear discharge.  dentist q16m Eyes: Negative for visual disturbance (see optho q1y -- vision corrected to 20/20 with glasses).  Respiratory: Negative for cough, chest tightness and shortness of breath.   Cardiovascular: Negative for chest pain, palpitations and leg swelling.  Gastrointestinal: Negative for abdominal pain, diarrhea, constipation and abdominal distention.  Genitourinary: Negative for urgency, frequency, decreased urine volume and difficulty urinating.  Musculoskeletal: Negative for back pain, arthralgias and gait problem.  Skin: Negative for color change, pallor and rash.  Neurological: Negative for dizziness, light-headedness, numbness and headaches.  Hematological: Negative for  adenopathy. Does not bruise/bleed easily.  Psychiatric/Behavioral: Negative for suicidal ideas, confusion, sleep disturbance, self-injury, dysphoric mood, decreased concentration and agitation.       Objective:    BP 110/78 (BP Location: Right Arm, Patient Position: Sitting, Cuff  Size: Large)   Pulse 72   Temp 98 F (36.7 C) (Oral)   Resp 16   Ht 5\' 7"  (1.702 m)   Wt 263 lb 12.8 oz (119.7 kg)   LMP 02/28/2016 (Approximate)   SpO2 95%   BMI 41.32 kg/m  General appearance: alert, cooperative, appears stated age and no distress Head: Normocephalic, without obvious abnormality, atraumatic Eyes: conjunctivae/corneas clear. PERRL, EOM's intact. Fundi benign. Ears: normal TM's and external ear canals both ears Nose: Nares normal. Septum midline. Mucosa normal. No drainage or sinus tenderness. Throat: lips, mucosa, and tongue normal; teeth and gums normal Neck: no adenopathy, no carotid bruit, no JVD, supple, symmetrical, trachea midline and thyroid not enlarged, symmetric, no tenderness/mass/nodules Back: symmetric, no curvature. ROM normal. No CVA tenderness. Lungs: clear to auscultation bilaterally Breasts: gyn Heart: regular rate and rhythm, S1, S2 normal, no murmur, click, rub or gallop Abdomen: soft, non-tender; bowel sounds normal; no masses,  no organomegaly Pelvic: deferred--gyn Extremities: extremities normal, atraumatic, no cyanosis or edema Pulses: 2+ and symmetric Skin: Skin color, texture, turgor normal. No rashes or lesions Lymph nodes: Cervical, supraclavicular, and axillary nodes normal. Neurologic: Alert and oriented X 3, normal strength and tone. Normal symmetric reflexes. Normal coordination and gait    Assessment:    Healthy female exam.      Plan:    ghm utd Check labs See After Visit Summary for Counseling Recommendations    1. Preventative health care ghm utd Check labs Colon to be done this year - Lipid panel - CBC with Differential/Platelet - POCT urinalysis dipstick - TSH - Comprehensive metabolic panel  2. Essential hypertension stable - Lipid panel - CBC with Differential/Platelet - POCT urinalysis dipstick - TSH - Comprehensive metabolic panel  3. 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 Generalized anxiety disorder Per psych  6. Mild single current episode of major depressive disorder [facility]) Per psych

## 2016-03-14 DIAGNOSIS — R87615 Unsatisfactory cytologic smear of cervix: Secondary | ICD-10-CM | POA: Diagnosis not present

## 2016-03-14 DIAGNOSIS — Z01419 Encounter for gynecological examination (general) (routine) without abnormal findings: Secondary | ICD-10-CM | POA: Diagnosis not present

## 2016-03-14 DIAGNOSIS — Z1231 Encounter for screening mammogram for malignant neoplasm of breast: Secondary | ICD-10-CM | POA: Diagnosis not present

## 2016-03-14 DIAGNOSIS — Z1151 Encounter for screening for human papillomavirus (HPV): Secondary | ICD-10-CM | POA: Diagnosis not present

## 2016-03-14 DIAGNOSIS — Z6841 Body Mass Index (BMI) 40.0 and over, adult: Secondary | ICD-10-CM | POA: Diagnosis not present

## 2016-03-21 DIAGNOSIS — Z1211 Encounter for screening for malignant neoplasm of colon: Secondary | ICD-10-CM | POA: Diagnosis not present

## 2016-03-21 DIAGNOSIS — K635 Polyp of colon: Secondary | ICD-10-CM | POA: Diagnosis not present

## 2016-03-21 DIAGNOSIS — D125 Benign neoplasm of sigmoid colon: Secondary | ICD-10-CM | POA: Diagnosis not present

## 2016-03-21 LAB — HM COLONOSCOPY

## 2016-03-25 DIAGNOSIS — Z1211 Encounter for screening for malignant neoplasm of colon: Secondary | ICD-10-CM | POA: Diagnosis not present

## 2016-03-25 DIAGNOSIS — K635 Polyp of colon: Secondary | ICD-10-CM | POA: Diagnosis not present

## 2016-04-01 ENCOUNTER — Other Ambulatory Visit: Payer: Self-pay | Admitting: *Deleted

## 2016-04-01 DIAGNOSIS — I10 Essential (primary) hypertension: Secondary | ICD-10-CM

## 2016-04-01 DIAGNOSIS — F320 Major depressive disorder, single episode, mild: Secondary | ICD-10-CM

## 2016-04-01 MED ORDER — BUPROPION HCL ER (XL) 300 MG PO TB24: 300.0000 mg | ORAL_TABLET | Freq: Every day | ORAL | 1 refills | Status: DC

## 2016-04-01 MED ORDER — SPIRONOLACTONE 25 MG PO TABS: 25.0000 mg | ORAL_TABLET | Freq: Two times a day (BID) | ORAL | 1 refills | Status: DC

## 2016-04-01 NOTE — Telephone Encounter (Signed)
Rx for Wellbutrin and [facility]  We received fax from them.  Also sent mychart message about Lexapro.  We no longer have that on her list anymore.  Not sure if they just never [facility].

## 2016-04-10 ENCOUNTER — Other Ambulatory Visit: Payer: Self-pay | Admitting: Family Medicine

## 2016-04-10 DIAGNOSIS — R87615 Unsatisfactory cytologic smear of cervix: Secondary | ICD-10-CM | POA: Diagnosis not present

## 2016-04-11 ENCOUNTER — Other Ambulatory Visit: Payer: Self-pay | Admitting: Emergency Medicine

## 2016-04-11 NOTE — Telephone Encounter (Signed)
Advise on is refill as is not on current list Historical was last filled on 04/19/2015 Advise please

## 2016-04-18 DIAGNOSIS — N920 Excessive and frequent menstruation with regular cycle: Secondary | ICD-10-CM | POA: Diagnosis not present

## 2016-04-18 DIAGNOSIS — Z30431 Encounter for routine checking of intrauterine contraceptive device: Secondary | ICD-10-CM | POA: Diagnosis not present

## 2016-04-18 DIAGNOSIS — D259 Leiomyoma of uterus, unspecified: Secondary | ICD-10-CM | POA: Diagnosis not present

## 2016-04-25 ENCOUNTER — Telehealth: Payer: Self-pay | Admitting: Family Medicine

## 2016-04-25 NOTE — Telephone Encounter (Signed)
Pt dropped off a medical necessity form for Dr. Etter Sjogren to complete, documents placed in tray at front office

## 2016-04-28 ENCOUNTER — Other Ambulatory Visit: Payer: Self-pay | Admitting: Family Medicine

## 2016-04-28 DIAGNOSIS — I10 Essential (primary) hypertension: Secondary | ICD-10-CM

## 2016-04-28 MED ORDER — LABETALOL HCL 100 MG PO TABS: 100.0000 mg | ORAL_TABLET | Freq: Two times a day (BID) | ORAL | 1 refills | Status: DC

## 2016-04-28 MED ORDER — LABETALOL HCL 100 MG PO TABS: 100.0000 mg | ORAL_TABLET | Freq: Two times a day (BID) | ORAL | 0 refills | Status: DC

## 2016-04-28 NOTE — Telephone Encounter (Signed)
Sent in #60 to the local  Pharmacy Sent in #180 to mail order. Called the patient to inform had to leave a detailed message prescriptions done as requested and apologized for the delay.

## 2016-04-28 NOTE — Telephone Encounter (Signed)
Relation to [id number]  Call back number:[phone] Pharmacy: [place], [place] [phone] (Phone) [phone] (Fax)      Reason for call:  Patient requesting a 90 day supply supply of labetalol (NORMODYNE) 100 MG tablet, patient states mail order sent 2 request and no response, patient would like a 2 week supply sent to retail and 90 day supply to mail order, patient is out, please advise

## 2016-05-05 NOTE — Telephone Encounter (Signed)
Received completed and signed Letter of medical necessity forms from Dr. [name].  Called and informed the pt that the forms have been completed and ready to be picked up.  Pt stated that she will come by today to pick up the forms.  Forms were placed up front for the pt to pick up.  Copy was made to be scanned into the chart.//AB/CMA

## 2016-05-12 DIAGNOSIS — Z6841 Body Mass Index (BMI) 40.0 and over, adult: Secondary | ICD-10-CM | POA: Diagnosis not present

## 2016-05-20 DIAGNOSIS — D252 Subserosal leiomyoma of uterus: Secondary | ICD-10-CM | POA: Diagnosis not present

## 2016-05-20 DIAGNOSIS — F322 Major depressive disorder, single episode, severe without psychotic features: Secondary | ICD-10-CM | POA: Diagnosis not present

## 2016-05-20 DIAGNOSIS — N920 Excessive and frequent menstruation with regular cycle: Secondary | ICD-10-CM | POA: Diagnosis not present

## 2016-05-20 DIAGNOSIS — R87615 Unsatisfactory cytologic smear of cervix: Secondary | ICD-10-CM | POA: Diagnosis not present

## 2016-05-20 DIAGNOSIS — D251 Intramural leiomyoma of uterus: Secondary | ICD-10-CM | POA: Diagnosis not present

## 2016-05-23 DIAGNOSIS — F322 Major depressive disorder, single episode, severe without psychotic features: Secondary | ICD-10-CM | POA: Diagnosis not present

## 2016-06-02 DIAGNOSIS — F322 Major depressive disorder, single episode, severe without psychotic features: Secondary | ICD-10-CM

## 2016-06-17 ENCOUNTER — Telehealth: Payer: Self-pay | Admitting: Family Medicine

## 2016-06-17 DIAGNOSIS — Z6841 Body Mass Index (BMI) 40.0 and over, adult: Secondary | ICD-10-CM | POA: Diagnosis not present

## 2016-06-17 NOTE — Telephone Encounter (Signed)
°  Relation to [id number] Call back number:[phone]   Reason for call:  Patient requesting print out of all December physical lab results and would like to pick up today, please advise when ready

## 2016-06-17 NOTE — Telephone Encounter (Signed)
Printed labs as requested put in an envelope and put at the front desk. Called the patient left a detailed message request is done.

## 2016-06-30 DIAGNOSIS — F322 Major depressive disorder, single episode, severe without psychotic features: Secondary | ICD-10-CM | POA: Diagnosis not present

## 2016-07-02 ENCOUNTER — Ambulatory Visit (INDEPENDENT_AMBULATORY_CARE_PROVIDER_SITE_OTHER): Payer: BLUE CROSS/BLUE SHIELD | Admitting: Family Medicine

## 2016-07-02 ENCOUNTER — Encounter: Payer: Self-pay | Admitting: Family Medicine

## 2016-07-02 VITALS — BP 124/65 | HR 70 | Temp 97.8°F | Ht 67.0 in | Wt 259.8 lb

## 2016-07-02 DIAGNOSIS — H6122 Impacted cerumen, left ear: Secondary | ICD-10-CM

## 2016-07-02 DIAGNOSIS — H6982 Other specified disorders of Eustachian tube, left ear: Secondary | ICD-10-CM | POA: Diagnosis not present

## 2016-07-02 DIAGNOSIS — H6502 Acute serous otitis media, left ear: Secondary | ICD-10-CM

## 2016-07-02 MED ORDER — AMOXICILLIN 500 MG PO CAPS: 500.0000 mg | ORAL_CAPSULE | Freq: Three times a day (TID) | ORAL | 0 refills | Status: DC

## 2016-07-02 MED ORDER — FLUTICASONE PROPIONATE 50 MCG/ACT NA SUSP: 2.0000 | Freq: Every day | NASAL | 1 refills | Status: DC

## 2016-07-02 NOTE — Progress Notes (Signed)
Pre visit review using our clinic review tool, if applicable. No additional management support is needed unless otherwise documented below in the visit note. 

## 2016-07-02 NOTE — Patient Instructions (Addendum)
Wait until Saturday before taking antibiotic (amoxicillin). Use Tylenol (1000 mg every 6 hours as needed) and ibuprofen 400-600 mg every 6 hours for pain.  Keep using the nasal spray.

## 2016-07-02 NOTE — Progress Notes (Signed)
Chief Complaint  Patient presents with  . Otalgia    L ear for a few days. Denies any fever or discharge. States that the [place] feels like being on an airplane.    Pt is here for left ear pain/popping. Duration: 3 days Progression: unchanged Associated symptoms:  Denies: fevers, hearing lossbleeding, or discharge from ear Treatment to date: None  ROS:  HEENT: +ear pain Costitutional: Denies fevers  Past Medical History:  Diagnosis Date  . Anxiety   . Depression   . HTN (hypertension) 2007   seen in ER  with headaches  . Hypertension   . Hypokalemia 2007   due to HCTZ   Family History  Problem Relation Age of Onset  . Cancer Maternal Grandmother     thyroid  . Hypertension Maternal Grandmother   . Hyperlipidemia Mother   . Hypertension Mother   . Breast cancer Maternal Aunt   . Kidney disease Maternal Aunt      X 2; 1 on Dialysis  . Cancer Maternal Aunt     ? primary; also had HTN  . Prostate cancer Paternal Uncle       3 uncles  . Prostate cancer Father   . Hypertension Paternal Grandmother   . Hypertension Maternal Aunt     X 2  . Bipolar disorder Paternal Aunt   . Stroke Neg Hx   . Diabetes Neg Hx   . Heart disease Neg Hx   . Colon cancer Neg Hx    Past Surgical History:  Procedure Laterality Date  . arm surgery     post trauma @ age 7  . CHOLECYSTECTOMY     gall stones  . WISDOM TOOTH EXTRACTION      BP 124/65 (BP Location: Left Arm, Patient Position: Sitting, Cuff Size: Large)   Pulse 70   Temp 97.8 F (36.6 C) (Oral)   Ht 5\' 7"  (1.702 m)   Wt 259 lb 12.8 oz (117.8 kg)   SpO2 100% Comment: RA  BMI 40.69 kg/m  General: Awake, alert, appearing stated age HEENT:  L ear- Initially canal 100% obstructed by cerumen. After removal, canal patent without drainage or erythema, TM is erythematous, some serous appearing fluid behind TM, no significant bulging or retractions R ear- canal patent without drainage or erythema, TM is neg Nose- nares patent  and without discharge Mouth- Lips, gums and dentition unremarkable, pharynx is without erythema or exudate Neck: No adenopathy Lungs: Normal effort, no accessory muscle use Psych: Age appropriate judgment and insight, normal mood and affect  Procedure note: Cerumen removal Verbal consent obtained A curette with light source was used to manually disimpact cerumen from L ear canal.  A large portion of dry and hard wax was removed.  The patient tolerated this well. No complications noted.  Acute serous otitis media of left ear, recurrence not specified - Plan: amoxicillin (AMOXIL) 500 MG capsule  Dysfunction of left eustachian tube - Plan: fluticasone (FLONASE) 50 MCG/ACT nasal spray  Impacted cerumen of left ear  Orders as above. Hold amoxicillin over next couple days. If pain resolves with supportive care, do not take. Tylenol, NSAIDs, INCS. F/u prn. Pt voiced understanding and agreement to the plan.  [place], DO 07/03/16 7:13 AM

## 2016-07-18 DIAGNOSIS — Z6841 Body Mass Index (BMI) 40.0 and over, adult: Secondary | ICD-10-CM | POA: Diagnosis not present

## 2016-07-18 DIAGNOSIS — D259 Leiomyoma of uterus, unspecified: Secondary | ICD-10-CM | POA: Diagnosis not present

## 2016-08-08 ENCOUNTER — Ambulatory Visit (HOSPITAL_BASED_OUTPATIENT_CLINIC_OR_DEPARTMENT_OTHER)
Admission: RE | Admit: 2016-08-08 | Discharge: 2016-08-08 | Disposition: A | Discharge status: Home / Self Care | Payer: BLUE CROSS/BLUE SHIELD | Source: Ambulatory Visit | Attending: Medical | Admitting: Medical

## 2016-08-08 ENCOUNTER — Ambulatory Visit (INDEPENDENT_AMBULATORY_CARE_PROVIDER_SITE_OTHER): Payer: BLUE CROSS/BLUE SHIELD | Admitting: Medical

## 2016-08-08 ENCOUNTER — Encounter: Payer: Self-pay | Admitting: Medical

## 2016-08-08 VITALS — BP 144/75 | HR 65 | Temp 98.1°F | Resp 16 | Ht 67.0 in | Wt 255.2 lb

## 2016-08-08 DIAGNOSIS — M79674 Pain in right toe(s): Secondary | ICD-10-CM

## 2016-08-08 DIAGNOSIS — M79671 Pain in right foot: Secondary | ICD-10-CM

## 2016-08-08 DIAGNOSIS — M7989 Other specified soft tissue disorders: Secondary | ICD-10-CM | POA: Diagnosis not present

## 2016-08-08 LAB — CBC WITH DIFFERENTIAL/PLATELET
Basophils Absolute: 0.1 10*3/uL (ref 0.0–0.1)
Basophils Relative: 0.6 % (ref 0.0–3.0)
EOS ABS: 0.1 10*3/uL (ref 0.0–0.7)
EOS PCT: 1.2 % (ref 0.0–5.0)
HEMATOCRIT: 37.1 % (ref 36.0–46.0)
HEMOGLOBIN: 11.9 g/dL — AB (ref 12.0–15.0)
LYMPHS PCT: 20.7 % (ref 12.0–46.0)
Lymphs Abs: 1.8 10*3/uL (ref 0.7–4.0)
MCHC: 32 g/dL (ref 30.0–36.0)
MCV: 81.7 fl (ref 78.0–100.0)
MONOS PCT: 6 % (ref 3.0–12.0)
Monocytes Absolute: 0.5 10*3/uL (ref 0.1–1.0)
Neutro Abs: 6.1 10*3/uL (ref 1.4–7.7)
Neutrophils Relative %: 71.5 % (ref 43.0–77.0)
Platelets: 254 10*3/uL (ref 150.0–400.0)
RBC: 4.55 Mil/uL (ref 3.87–5.11)
RDW: 15.3 % (ref 11.5–15.5)
WBC: 8.5 10*3/uL (ref 4.0–10.5)

## 2016-08-08 LAB — URIC ACID: Uric Acid, Serum: 7 mg/dL (ref 2.4–7.0)

## 2016-08-08 MED ORDER — CEPHALEXIN 500 MG PO CAPS: 500.0000 mg | ORAL_CAPSULE | Freq: Two times a day (BID) | ORAL | 0 refills | Status: DC

## 2016-08-08 NOTE — Progress Notes (Signed)
Subjective:    Patient ID: Cheryl Owens, female    DOB: [birth year], 51 y.o.   MRN: [id number]  HPI  Pt in for rt 4th toe. Some mild pain and swelling. Pt does not remember any specific trauma. She speculate maybe may have hit toe moving into new house but can't really specify. Currenlty states no pain presently. But she states she she rubs lotion to toe will have pain. Then states whey she walks hurts little when toe has pressure. This started maybe on Monday. No recent pedicure before this started. No hx of gout. No meds used.  Review of Systems  Constitutional: Negative for chills, fatigue and fever.  Respiratory: Negative for cough, chest tightness, shortness of breath and wheezing.   Cardiovascular: Negative for chest pain and palpitations.  Gastrointestinal: Negative for abdominal distention, blood in stool and constipation.  Musculoskeletal: Negative for back pain.       Rt 4th toe pain.  Skin: Negative for rash.  Hematological: Negative for adenopathy. Does not bruise/bleed easily.  Psychiatric/Behavioral: Negative for behavioral problems, confusion and sleep disturbance.    Past Medical History:  Diagnosis Date  . Anxiety   . Depression   . HTN (hypertension) 2007   seen in ER  with headaches  . Hypertension   . Hypokalemia 2007   due to HCTZ     Social History   Social History  . Marital status: Single    Spouse name: N/A  . Number of children: N/A  . Years of education: N/A   Occupational History  . Not on file.   Social History Main Topics  . Smoking status: Never Smoker  . Smokeless tobacco: Never Used  . Alcohol use No  . Drug use: No  . Sexual activity: No   Other Topics Concern  . Not on file   Social History Narrative  . No narrative on file    Past Surgical History:  Procedure Laterality Date  . arm surgery     post trauma @ age 46  . CHOLECYSTECTOMY     gall stones  . WISDOM TOOTH EXTRACTION      Family History  Problem Relation Age  of Onset  . Cancer Maternal Grandmother        thyroid  . Hypertension Maternal Grandmother   . Hyperlipidemia Mother   . Hypertension Mother   . Breast cancer Maternal Aunt   . Kidney disease Maternal Aunt         X 2; 1 on Dialysis  . Cancer Maternal Aunt        ? primary; also had HTN  . Prostate cancer Paternal Uncle          3 uncles  . Prostate cancer Father   . Hypertension Paternal Grandmother   . Hypertension Maternal Aunt        X 2  . Bipolar disorder Paternal Aunt   . Stroke Neg Hx   . Diabetes Neg Hx   . Heart disease Neg Hx   . Colon cancer Neg Hx     Allergies  Allergen Reactions  . Hydrochlorothiazide     REACTION: hypokalemia  . Benazepril     Nausea& vomiting, abd cramps    Current Outpatient Prescriptions on File Prior to Visit  Medication Sig Dispense Refill  . BELVIQ XR 20 MG TB24     . buPROPion (WELLBUTRIN XL) 300 MG 24 hr tablet Take 1 tablet (300 mg total) by mouth daily. Office visit  due now 90 tablet 1  . clonazePAM (KLONOPIN) 1 MG tablet Take .5 in the morning and 1 at bedtime 45 tablet 0  . escitalopram (LEXAPRO) 10 MG tablet TAKE 1 TABLET DAILY 90 tablet 1  . [name] 0.35 MG tablet Take 1 tablet by mouth daily.    Marland Kitchen labetalol (NORMODYNE) 100 MG tablet Take 1 tablet (100 mg total) by mouth 2 (two) times daily. 60 tablet 0  . NONFORMULARY OR COMPOUNDED ITEM Weight watchers ---for weight loss, bmi 41 1 each 0  . NONFORMULARY OR COMPOUNDED ITEM Gym membership with trainer   Dx obesity , bmi 41 1 each 11  . spironolactone (ALDACTONE) 25 MG tablet Take 1 tablet (25 mg total) by mouth 2 (two) times daily. 180 tablet 1   No current facility-administered medications on file prior to visit.     BP (!) 144/75 (BP Location: Left Arm, Patient Position: Sitting, Cuff Size: Large)   Pulse 65   Temp 98.1 F (36.7 C) (Oral)   Resp 16   Ht 5\' 7"  (1.702 m)   Wt 255 lb 3.2 oz (115.8 kg)   LMP  (Exact Date)   SpO2 99%   BMI 39.97 kg/m         Objective:   Physical Exam  General- No acute distress. Pleasant patient. Neck- Full range of motion, no jvd Lungs- Clear, even and unlabored. Heart- regular rate and rhythm. Neurologic- CNII- XII grossly intact.  Rt foot-  no warmth. On inspection rt 4th toe is moderate swollen. Faint tender on top when palpation.  Faint pain at base of toe and distal 4th metatarsal. The foot overall is not swollen.      Assessment & Plan:  For foot pain and toe pain will get xray of foot, uric acid and cbc today. Advise ibuprofen low dose 200-400 mg 3 time daily pending test results. If your toe becomes red, swollen and tender like infection over weekend making keflex antibiotic available. But antibiotic not indicated presently.  Follow up in 7 days or as needed  Recommend Tuesday follow up with me but she expressed very busy. Advised could try to make same day doc of day appointment if needed sometime next week.  Also asked if she does take the antibiotic for change in appearance please notify us  Harding Thomure, [place]ercell Miller, [place]

## 2016-08-08 NOTE — Patient Instructions (Signed)
For foot pain and toe pain will get xray of foot, uric acid and cbc today. Advise ibuprofen low dose 200-400 mg 3 time daily pending test results. If your toe becomes red, swollen and tender like infection over weekend making keflex antibiotic available. But antibiotic not indicated presently.  Follow up in 7 days or as needed

## 2016-08-10 ENCOUNTER — Encounter: Payer: Self-pay | Admitting: Family Medicine

## 2016-08-20 DIAGNOSIS — F322 Major depressive disorder, single episode, severe without psychotic features: Secondary | ICD-10-CM | POA: Diagnosis not present

## 2016-08-27 DIAGNOSIS — E669 Obesity, unspecified: Secondary | ICD-10-CM | POA: Diagnosis not present

## 2016-08-27 DIAGNOSIS — Z6838 Body mass index (BMI) 38.0-38.9, adult: Secondary | ICD-10-CM | POA: Diagnosis not present

## 2016-09-08 ENCOUNTER — Ambulatory Visit (INDEPENDENT_AMBULATORY_CARE_PROVIDER_SITE_OTHER): Payer: BLUE CROSS/BLUE SHIELD | Admitting: Family Medicine

## 2016-09-08 ENCOUNTER — Encounter: Payer: Self-pay | Admitting: Family Medicine

## 2016-09-08 ENCOUNTER — Telehealth: Payer: Self-pay | Admitting: Family Medicine

## 2016-09-08 VITALS — BP 120/82 | HR 62 | Temp 98.2°F | Ht 67.0 in | Wt 251.4 lb

## 2016-09-08 DIAGNOSIS — M79672 Pain in left foot: Secondary | ICD-10-CM

## 2016-09-08 MED ORDER — NAPROXEN 500 MG PO TBEC: 500.0000 mg | DELAYED_RELEASE_TABLET | Freq: Two times a day (BID) | ORAL | 0 refills | Status: DC

## 2016-09-08 NOTE — Progress Notes (Signed)
Musculoskeletal Exam  Patient: Cheryl Owens DOB: Aug 08, 1965  DOS: 09/08/2016  SUBJECTIVE:  Chief Complaint:   Chief Complaint  Patient presents with  . Foot Pain     left foot. Started saturday, has worsened.    Cheryl Owens is a 51 y.o.  female for evaluation and treatment of r foot pain.   Onset:  2 days ago. No injury, has been walking more.  Location: back of R foot Character:  sharp and throbbing  Progression of issue:  is unchanged Associated symptoms: None Treatment: to date has been OTC NSAIDS.   Neurovascular symptoms: no  ROS: Musculoskeletal/Extremities: +L heel pain Neurologic: no numbness, tingling no weakness   Past Medical History:  Diagnosis Date  . Anxiety   . Depression   . HTN (hypertension) 2007   seen in ER  with headaches  . Hypertension   . Hypokalemia 2007   due to HCTZ   Past Surgical History:  Procedure Laterality Date  . arm surgery     post trauma @ age 14  . CHOLECYSTECTOMY     gall stones  . WISDOM TOOTH EXTRACTION     Family History  Problem Relation Age of Onset  . Cancer Maternal Grandmother        thyroid  . Hypertension Maternal Grandmother   . Hyperlipidemia Mother   . Hypertension Mother   . Breast cancer Maternal Aunt   . Kidney disease Maternal Aunt         X 2; 1 on Dialysis  . Cancer Maternal Aunt        ? primary; also had HTN  . Prostate cancer Paternal Uncle          3 uncles  . Prostate cancer Father   . Hypertension Paternal Grandmother   . Hypertension Maternal Aunt        X 2  . Bipolar disorder Paternal Aunt   . Stroke Neg Hx   . Diabetes Neg Hx   . Heart disease Neg Hx   . Colon cancer Neg Hx    Current Outpatient Prescriptions  Medication Sig Dispense Refill  . BELVIQ XR 20 MG TB24     . buPROPion (WELLBUTRIN XL) 300 MG 24 hr tablet Take 1 tablet (300 mg total) by mouth daily. Office visit due now 90 tablet 1  . clonazePAM (KLONOPIN) 1 MG tablet Take .5 in the morning and 1 at bedtime 45 tablet 0   . escitalopram (LEXAPRO) 10 MG tablet TAKE 1 TABLET DAILY 90 tablet 1  . [name] 0.35 MG tablet Take 1 tablet by mouth daily.    Marland Kitchen labetalol (NORMODYNE) 100 MG tablet Take 1 tablet (100 mg total) by mouth 2 (two) times daily. 60 tablet 0  . NONFORMULARY OR COMPOUNDED ITEM Weight watchers ---for weight loss, bmi 41 1 each 0  . NONFORMULARY OR COMPOUNDED ITEM Gym membership with trainer   Dx obesity , bmi 41 1 each 11  . spironolactone (ALDACTONE) 25 MG tablet Take 1 tablet (25 mg total) by mouth 2 (two) times daily. 180 tablet 1   Allergies  Allergen Reactions  . Hydrochlorothiazide     REACTION: hypokalemia  . Benazepril     Nausea& vomiting, abd cramps   Social History   Social History  . Marital status: Single   Social History Main Topics  . Smoking status: Never Smoker  . Smokeless tobacco: Never Used  . Alcohol use No  . Drug use: No  . Sexual activity:  No   Objective: VITAL SIGNS: BP 120/82 (BP Location: Left Arm, Patient Position: Sitting, Cuff Size: Large)   Pulse 62   Temp 98.2 F (36.8 C) (Oral)   Ht 5\' 7"  (1.702 m)   Wt 251 lb 6 oz (114 kg)   SpO2 96%   BMI 39.37 kg/m  Constitutional: Well formed, well developed. No acute distress. Cardiovascular: Brisk cap refill Thorax & Lungs: No accessory muscle use Extremities: No clubbing. No cyanosis. No edema.  Skin: Warm. Dry. No erythema. No rash.  Musculoskeletal: L heel.   Tenderness to palpation: yes- over distal insertion of calcaneus over bursa Deformity: no Ecchymosis: no Warmth: No  Neurologic: Normal sensory function. No focal deficits noted. DTR's equal and symmetry in LE's. No clonus. Psychiatric: Normal mood. Age appropriate judgment and insight. Alert & oriented x 3.    Assessment:  Pain of left heel - Plan: naproxen (EC NAPROSYN) 500 MG EC tablet  Plan: Orders as above. Concern for calcaneal bursitis more than calcaneal tendinopathy. Tylenol. Ice. Rx for CAM walker boot given. She also had LE  edema so compression stockings were also given. No OTC NSAIDs. Cycling/elliptical machine instead of walking/running. F/u prn. The patient voiced understanding and agreement to the plan.   [place], DO 09/08/16  12:06 PM

## 2016-09-08 NOTE — Patient Instructions (Addendum)
OK to take Tylenol 1000 mg (2 extra strength tabs) or 975 mg (3 regular strength tabs) every 6 hours as needed.  Ice/cold pack over area for 10-15 min every 2-3 hours while awake.  Consider substituting cycling or the elliptical machine for walking/running.  Let us know if you need anything.

## 2016-09-08 NOTE — Progress Notes (Signed)
Pre visit review using our clinic review tool, if applicable. No additional management support is needed unless otherwise documented below in the visit note. 

## 2016-09-08 NOTE — Telephone Encounter (Signed)
Relation to QV:OHCS Call back number:[phone]   Reason for call:  Patient states [facility] does not sell the CAM walker boot and would like Rx sent to another manufacture, please advise

## 2016-09-08 NOTE — Telephone Encounter (Signed)
We actually have one in the supply storage, have her come in and pick it up. I will leave it at Angie's desk. TY.

## 2016-09-09 NOTE — Telephone Encounter (Signed)
I called in an anti-inflammatory which is 500 mg BID. Tylenol is a different medicine that should be taken as I wrote on AVS. TY.

## 2016-09-09 NOTE — Telephone Encounter (Signed)
Patient informed of instructions. She verbalized understanding. 

## 2016-09-09 NOTE — Telephone Encounter (Signed)
Patient is asking about her medication. Says her paper says her AVS and pill bottle is not saying the same thing. AVS says take 1000mg  q 6 hrs of tylenol and bottle says take 500 mg twice daily. Please confirm with patient   [phone]

## 2016-09-26 DIAGNOSIS — E669 Obesity, unspecified: Secondary | ICD-10-CM | POA: Diagnosis not present

## 2016-09-26 DIAGNOSIS — Z6839 Body mass index (BMI) 39.0-39.9, adult: Secondary | ICD-10-CM | POA: Diagnosis not present

## 2016-10-06 DIAGNOSIS — F322 Major depressive disorder, single episode, severe without psychotic features: Secondary | ICD-10-CM | POA: Diagnosis not present

## 2016-10-14 ENCOUNTER — Telehealth: Payer: Self-pay | Admitting: *Deleted

## 2016-10-14 MED ORDER — NORETHINDRONE 0.35 MG PO TABS: 1.0000 | ORAL_TABLET | Freq: Every day | ORAL | 2 refills | Status: DC

## 2016-10-14 NOTE — Telephone Encounter (Signed)
Pt called requesting refill on Cheryl Owens birth control pills, pt saw Dr.Lavoie on 05/20/2016 for annual exam. Per note pt has Mirena IUD and takes Cheryl Owens for menorrhagia. Pt also mentioned about depo Lupron injection due to fibroids, I left a detailed message on pt voicemail that per note if she received depo Lupron on 5/18 will have pelvic ultrasound in 6 months.

## 2016-10-15 NOTE — Telephone Encounter (Signed)
Pt called back regarding the below for depo lupron, per Dr.Lavoie note on 05/20/16 the plan was to try "Depo Lupron x 6 months to shrink myomas and improve HB." and reassess at 6 month with pelvic ultrasound. Pt did receive depo lupron in 07/2016, the last sentence was error as I read the chart wrong. Pt has lupron medication at home with her now, will schedule nurse appointment to have injection. Pt coming on 10/17/16 @ 8:30am

## 2016-10-16 ENCOUNTER — Encounter: Payer: Self-pay | Admitting: Anesthesiology

## 2016-10-17 ENCOUNTER — Other Ambulatory Visit: Payer: Self-pay | Admitting: *Deleted

## 2016-10-17 ENCOUNTER — Ambulatory Visit (INDEPENDENT_AMBULATORY_CARE_PROVIDER_SITE_OTHER): Payer: BLUE CROSS/BLUE SHIELD | Admitting: Anesthesiology

## 2016-10-17 DIAGNOSIS — N921 Excessive and frequent menstruation with irregular cycle: Secondary | ICD-10-CM

## 2016-10-17 DIAGNOSIS — N920 Excessive and frequent menstruation with regular cycle: Secondary | ICD-10-CM

## 2016-10-17 MED ORDER — LEUPROLIDE ACETATE (3 MONTH) 11.25 MG IM KIT
11.2500 mg | PACK | Freq: Once | INTRAMUSCULAR | Status: AC
  Administered 2016-10-17: 11.25 mg via INTRAMUSCULAR

## 2016-10-24 ENCOUNTER — Other Ambulatory Visit: Payer: Self-pay | Admitting: Family Medicine

## 2016-10-31 DIAGNOSIS — Z6839 Body mass index (BMI) 39.0-39.9, adult: Secondary | ICD-10-CM | POA: Diagnosis not present

## 2016-10-31 DIAGNOSIS — E669 Obesity, unspecified: Secondary | ICD-10-CM | POA: Diagnosis not present

## 2016-11-13 DIAGNOSIS — F322 Major depressive disorder, single episode, severe without psychotic features: Secondary | ICD-10-CM | POA: Diagnosis not present

## 2016-12-02 DIAGNOSIS — Z6838 Body mass index (BMI) 38.0-38.9, adult: Secondary | ICD-10-CM | POA: Diagnosis not present

## 2016-12-02 DIAGNOSIS — E669 Obesity, unspecified: Secondary | ICD-10-CM | POA: Diagnosis not present

## 2016-12-08 DIAGNOSIS — F322 Major depressive disorder, single episode, severe without psychotic features: Secondary | ICD-10-CM | POA: Diagnosis not present

## 2017-01-13 ENCOUNTER — Other Ambulatory Visit: Payer: Self-pay | Admitting: Family Medicine

## 2017-01-13 DIAGNOSIS — F320 Major depressive disorder, single episode, mild: Secondary | ICD-10-CM

## 2017-01-19 ENCOUNTER — Ambulatory Visit: Payer: BLUE CROSS/BLUE SHIELD | Admitting: Obstetrics & Gynecology

## 2017-01-19 ENCOUNTER — Other Ambulatory Visit: Payer: BLUE CROSS/BLUE SHIELD

## 2017-01-21 ENCOUNTER — Ambulatory Visit (INDEPENDENT_AMBULATORY_CARE_PROVIDER_SITE_OTHER): Payer: BLUE CROSS/BLUE SHIELD

## 2017-01-21 ENCOUNTER — Ambulatory Visit: Payer: BLUE CROSS/BLUE SHIELD | Admitting: Obstetrics & Gynecology

## 2017-01-21 ENCOUNTER — Other Ambulatory Visit: Payer: Self-pay | Admitting: Obstetrics & Gynecology

## 2017-01-21 ENCOUNTER — Encounter: Payer: Self-pay | Admitting: Obstetrics & Gynecology

## 2017-01-21 VITALS — Wt 239.0 lb

## 2017-01-21 DIAGNOSIS — N920 Excessive and frequent menstruation with regular cycle: Secondary | ICD-10-CM

## 2017-01-21 DIAGNOSIS — N852 Hypertrophy of uterus: Secondary | ICD-10-CM

## 2017-01-21 DIAGNOSIS — N911 Secondary amenorrhea: Secondary | ICD-10-CM | POA: Diagnosis not present

## 2017-01-21 DIAGNOSIS — Z01419 Encounter for gynecological examination (general) (routine) without abnormal findings: Secondary | ICD-10-CM

## 2017-01-21 DIAGNOSIS — T8332XA Displacement of intrauterine contraceptive device, initial encounter: Secondary | ICD-10-CM

## 2017-01-21 DIAGNOSIS — D252 Subserosal leiomyoma of uterus: Secondary | ICD-10-CM | POA: Diagnosis not present

## 2017-01-21 DIAGNOSIS — N939 Abnormal uterine and vaginal bleeding, unspecified: Secondary | ICD-10-CM | POA: Diagnosis not present

## 2017-01-21 DIAGNOSIS — N83201 Unspecified ovarian cyst, right side: Secondary | ICD-10-CM

## 2017-01-21 DIAGNOSIS — Z30432 Encounter for removal of intrauterine contraceptive device: Secondary | ICD-10-CM | POA: Diagnosis not present

## 2017-01-21 DIAGNOSIS — N921 Excessive and frequent menstruation with irregular cycle: Secondary | ICD-10-CM

## 2017-01-21 DIAGNOSIS — D251 Intramural leiomyoma of uterus: Secondary | ICD-10-CM

## 2017-01-21 DIAGNOSIS — D259 Leiomyoma of uterus, unspecified: Secondary | ICD-10-CM | POA: Diagnosis not present

## 2017-01-21 NOTE — Progress Notes (Signed)
    Cheryl Owens 1965/12/10 [id number]        51 y.o.  [id number]   RP:  Removal of Mirena IUD under US guidance, strings not visible  HPI:  Pelvic US confirmed IU location of IUD, done because the strings are not visible.  Menometro even on Mirena IUD, so Cheryl Owens was added.  But recently not vaginal bleeding, menopausal?  Insertion of IUD was 01/2012.  Followed by Cheryl Owens at [facility] for Weight loss management, doing well on Belviq with low caloric diet/regular physical activity.  BMI today 37.43.  Seen by me on 03/2016 for her Annual Gyn exam, Pap insufficient cells/HPV HR neg, repeated Pap on 05/2016 Negative.  Mammo benign 03/2016.  Past medical history,surgical history, problem list, medications, allergies, family history and social history were all reviewed and documented in the EPIC chart.  Directed ROS with pertinent positives and negatives documented in the history of present illness/assessment and plan.  Exam:  Vitals:   01/21/17 1027  Weight: 239 lb (108.4 kg)   General appearance:  Normal  Pelvic US today:  T/V and T/A Anteverted enlarged uterus measuring 13.38 by 14.47 x 8.69 cm.  Intra-mural fibroids measuring 6.7 cm, 6.1 cm, 4.9 cm, 4.4 cm and 3.8 cm.  Endometrial lining not seen.  IUD not seen in the intrauterine cavity but IUD strings visible in the cervix.  Right and left ovaries not seen.  Right adnexa small echo-free cystic mass measuring 6.1 x 5.3 x 3.6 cm.  Negative color flow Doppler.  Left adnexal tubular cystic mass measuring 5.0 x 3.7 cm.  No free fluid in the posterior cul-de-sac.  Gynecologic exam: Vulva normal.  Speculum: Cervix and vagina normal.  No IUD string visible.  Betadine prep and Hurricane spray on the cervix.  [place]eman clamp used in the Endo cervix under ultrasound guidance and IUD strings seized at that level with easy removal of the IUD.  IUD was complete and intact.  Shown to the patient.  Speculum removed.  Assessment/Plan:  51 y.o. [id number]    1. Intrauterine contraceptive device threads lost Mirena IUD removal under ultrasound guidance.  2. Encounter for IUD removal Easy removal of Mirena IUD under ultrasound guidance.  3. Uterine leiomyoma, unspecified location Will reevaluate uterine myomas with pelvic ultrasound in 3 months.  Will decide on management according to [facility] result and clinical evolution. - US Transvaginal Non-OB; Future  4. Menometrorrhagia No recent vaginal bleeding.  Mirena IUD removed today.  Will do [place] to rule out menopause before deciding on further treatment. - [place]  5. Well female fasting labs with future routine gynecological exam F/U Health labs and Annual Gyn exam. - CBC; Future - Comp Met (CMET); Future - Lipid panel; Future - TSH; Future - Vitamin D 1,25 dihydroxy; Future  6. Right ovarian cyst Right simple ovarian cyst measuring 6.1 cm.  Benign appearance on pelvic ultrasound today.  Will repeat ultrasound in 3 months for reevaluation.  Risk of rupture and torsion precautions discussed. - US Transvaginal Non-OB; Future  7. Secondary amenorrhea Possible menopause follow-up FSH. - FSH; Future  Counseling on above issues >50% x 25 minutes  Princess Bruins MD, 10:37 AM 01/21/2017

## 2017-01-23 DIAGNOSIS — E669 Obesity, unspecified: Secondary | ICD-10-CM | POA: Diagnosis not present

## 2017-01-23 DIAGNOSIS — Z6838 Body mass index (BMI) 38.0-38.9, adult: Secondary | ICD-10-CM | POA: Diagnosis not present

## 2017-01-23 DIAGNOSIS — Z0000 Encounter for general adult medical examination without abnormal findings: Secondary | ICD-10-CM | POA: Diagnosis not present

## 2017-01-23 DIAGNOSIS — Z131 Encounter for screening for diabetes mellitus: Secondary | ICD-10-CM | POA: Diagnosis not present

## 2017-01-25 ENCOUNTER — Encounter: Payer: Self-pay | Admitting: Obstetrics & Gynecology

## 2017-01-25 NOTE — Patient Instructions (Signed)
1. Intrauterine contraceptive device threads lost Mirena IUD removal under ultrasound guidance.  2. Encounter for IUD removal Easy removal of Mirena IUD under ultrasound guidance.  3. Uterine leiomyoma, unspecified location Will reevaluate uterine myomas with pelvic ultrasound in 3 months.  Will decide on management according to [facility] result and clinical evolution. - US Transvaginal Non-OB; Future  4. Menometrorrhagia No recent vaginal bleeding.  Mirena IUD removed today.  Will do [place] to rule out menopause before deciding on further treatment. - [place]  5. Well female fasting labs with future routine gynecological exam F/U Health labs and Annual Gyn exam. - CBC; Future - Comp Met (CMET); Future - Lipid panel; Future - TSH; Future - Vitamin D 1,25 dihydroxy; Future  6. Right ovarian cyst Right simple ovarian cyst measuring 6.1 cm.  Benign appearance on pelvic ultrasound today.  Will repeat ultrasound in 3 months for reevaluation.  Risk of rupture and torsion precautions discussed. - US Transvaginal Non-OB; Future  7. Secondary amenorrhea Possible menopause follow-up FSH. - FSH; Future  Deaja, it was a pleasure seeing you today!  I will see you again at your Annual-Gyn visit and f/u Pelvic US.

## 2017-02-10 DIAGNOSIS — Z6838 Body mass index (BMI) 38.0-38.9, adult: Secondary | ICD-10-CM | POA: Diagnosis not present

## 2017-02-10 DIAGNOSIS — E669 Obesity, unspecified: Secondary | ICD-10-CM | POA: Diagnosis not present

## 2017-02-10 DIAGNOSIS — R899 Unspecified abnormal finding in specimens from other organs, systems and tissues: Secondary | ICD-10-CM | POA: Diagnosis not present

## 2017-02-17 ENCOUNTER — Other Ambulatory Visit: Payer: BLUE CROSS/BLUE SHIELD

## 2017-02-18 DIAGNOSIS — F322 Major depressive disorder, single episode, severe without psychotic features: Secondary | ICD-10-CM | POA: Diagnosis not present

## 2017-02-19 ENCOUNTER — Other Ambulatory Visit: Payer: BLUE CROSS/BLUE SHIELD

## 2017-02-19 DIAGNOSIS — Z01419 Encounter for gynecological examination (general) (routine) without abnormal findings: Secondary | ICD-10-CM

## 2017-02-19 DIAGNOSIS — N911 Secondary amenorrhea: Secondary | ICD-10-CM

## 2017-02-22 LAB — COMPREHENSIVE METABOLIC PANEL
AG Ratio: 1.6 (calc) (ref 1.0–2.5)
ALBUMIN MSPROF: 4 g/dL (ref 3.6–5.1)
ALKALINE PHOSPHATASE (APISO): 99 U/L (ref 33–130)
ALT: 13 U/L (ref 6–29)
AST: 15 U/L (ref 10–35)
BUN/Creatinine Ratio: 8 (calc) (ref 6–22)
BUN: 10 mg/dL (ref 7–25)
CALCIUM: 9.5 mg/dL (ref 8.6–10.4)
CO2: 25 mmol/L (ref 20–32)
CREATININE: 1.28 mg/dL — AB (ref 0.50–1.05)
Chloride: 108 mmol/L (ref 98–110)
Globulin: 2.5 g/dL (calc) (ref 1.9–3.7)
Glucose, Bld: 90 mg/dL (ref 65–99)
POTASSIUM: 4.1 mmol/L (ref 3.5–5.3)
SODIUM: 141 mmol/L (ref 135–146)
TOTAL PROTEIN: 6.5 g/dL (ref 6.1–8.1)
Total Bilirubin: 0.4 mg/dL (ref 0.2–1.2)

## 2017-02-22 LAB — CBC
HEMATOCRIT: 37.9 % (ref 35.0–45.0)
HEMOGLOBIN: 12.3 g/dL (ref 11.7–15.5)
MCH: 27.3 pg (ref 27.0–33.0)
MCHC: 32.5 g/dL (ref 32.0–36.0)
MCV: 84 fL (ref 80.0–100.0)
MPV: 10.7 fL (ref 7.5–12.5)
Platelets: 287 10*3/uL (ref 140–400)
RBC: 4.51 10*6/uL (ref 3.80–5.10)
RDW: 13.7 % (ref 11.0–15.0)
WBC: 8.2 10*3/uL (ref 3.8–10.8)

## 2017-02-22 LAB — VITAMIN D 1,25 DIHYDROXY
VITAMIN D3 1, 25 (OH): 14 pg/mL
Vitamin D 1, 25 (OH)2 Total: 14 pg/mL — ABNORMAL LOW (ref 18–72)

## 2017-02-22 LAB — LIPID PANEL
CHOL/HDL RATIO: 4 (calc) (ref <5.0)
Cholesterol: 197 mg/dL (ref <200)
HDL: 49 mg/dL — ABNORMAL LOW (ref >50)
LDL CHOLESTEROL (CALC): 129 mg/dL — AB
NON-HDL CHOLESTEROL (CALC): 148 mg/dL — AB (ref <130)
Triglycerides: 87 mg/dL (ref <150)

## 2017-02-22 LAB — TSH: TSH: 1.7 mIU/L

## 2017-02-22 LAB — FOLLICLE STIMULATING HORMONE: FSH: 4.5 m[IU]/mL

## 2017-02-23 ENCOUNTER — Telehealth: Payer: Self-pay | Admitting: Family Medicine

## 2017-02-23 NOTE — Telephone Encounter (Signed)
Documents opened and numbered; forwarded to provider/SLS 12/17

## 2017-02-23 NOTE — Telephone Encounter (Signed)
Copied from [place] [id number]. Topic: Quick Communication - See Telephone Encounter >> Feb 23, 2017 11:39 AM Rosalin Hawking wrote: CRM for notification. See Telephone encounter for:  02/23/17.   Pt dropped off some results that she had done and wants to know the provider to have it on pt's chart. Document in a white small envelope. Document put at front office tray.

## 2017-02-27 ENCOUNTER — Telehealth: Payer: Self-pay | Admitting: *Deleted

## 2017-02-27 NOTE — Telephone Encounter (Signed)
See mychart.  

## 2017-02-27 NOTE — Telephone Encounter (Signed)
Pt called back and was notified of recommendation in mychart message. [facility] will schedule follow up for pt to discuss letter, results and additional testing.

## 2017-03-13 ENCOUNTER — Encounter: Payer: Self-pay | Admitting: Family Medicine

## 2017-03-13 ENCOUNTER — Ambulatory Visit: Payer: BLUE CROSS/BLUE SHIELD | Admitting: Family Medicine

## 2017-03-13 VITALS — BP 118/76 | HR 63 | Temp 98.2°F | Resp 16 | Ht 67.0 in | Wt 235.6 lb

## 2017-03-13 DIAGNOSIS — R7989 Other specified abnormal findings of blood chemistry: Secondary | ICD-10-CM | POA: Diagnosis not present

## 2017-03-13 DIAGNOSIS — I10 Essential (primary) hypertension: Secondary | ICD-10-CM

## 2017-03-13 DIAGNOSIS — E785 Hyperlipidemia, unspecified: Secondary | ICD-10-CM | POA: Diagnosis not present

## 2017-03-13 DIAGNOSIS — Z23 Encounter for immunization: Secondary | ICD-10-CM | POA: Diagnosis not present

## 2017-03-13 LAB — COMPREHENSIVE METABOLIC PANEL
ALT: 14 U/L (ref 0–35)
AST: 15 U/L (ref 0–37)
Albumin: 4.1 g/dL (ref 3.5–5.2)
Alkaline Phosphatase: 109 U/L (ref 39–117)
BUN: 11 mg/dL (ref 6–23)
CO2: 27 mEq/L (ref 19–32)
CREATININE: 1.26 mg/dL — AB (ref 0.40–1.20)
Calcium: 9.4 mg/dL (ref 8.4–10.5)
Chloride: 105 mEq/L (ref 96–112)
GFR: 57.39 mL/min — AB (ref >60.00)
Glucose, Bld: 78 mg/dL (ref 70–99)
Potassium: 4 mEq/L (ref 3.5–5.1)
Sodium: 140 mEq/L (ref 135–145)
TOTAL PROTEIN: 7.2 g/dL (ref 6.0–8.3)
Total Bilirubin: 0.7 mg/dL (ref 0.2–1.2)

## 2017-03-13 LAB — LIPID PANEL
CHOLESTEROL: 203 mg/dL — AB (ref 0–200)
HDL: 43.3 mg/dL (ref >39.00)
LDL CALC: 142 mg/dL — AB (ref 0–99)
NonHDL: 159.63
TRIGLYCERIDES: 87 mg/dL (ref 0.0–149.0)
Total CHOL/HDL Ratio: 5
VLDL: 17.4 mg/dL (ref 0.0–40.0)

## 2017-03-13 MED ORDER — NONFORMULARY OR COMPOUNDED ITEM: 0 refills | Status: DC

## 2017-03-13 MED ORDER — SPIRONOLACTONE 25 MG PO TABS: 25.0000 mg | ORAL_TABLET | Freq: Two times a day (BID) | ORAL | 3 refills | Status: DC

## 2017-03-13 MED ORDER — NONFORMULARY OR COMPOUNDED ITEM: 11 refills | Status: DC

## 2017-03-13 NOTE — Patient Instructions (Signed)
Exercising to Lose Weight Exercising can help you to lose weight. In order to lose weight through exercise, you need to do vigorous-intensity exercise. You can tell that you are exercising with vigorous intensity if you are breathing very hard and fast and cannot hold a conversation while exercising. Moderate-intensity exercise helps to maintain your current weight. You can tell that you are exercising at a moderate level if you have a higher heart rate and faster breathing, but you are still able to hold a conversation. How often should I exercise? Choose an activity that you enjoy and set realistic goals. Your health care provider can help you to make an activity plan that works for you. Exercise regularly as directed by your health care provider. This may include:  Doing resistance training twice each week, such as: ? Push-ups. ? Sit-ups. ? Lifting weights. ? Using resistance bands.  Doing a given intensity of exercise for a given amount of time. Choose from these options: ? 150 minutes of moderate-intensity exercise every week. ? 75 minutes of vigorous-intensity exercise every week. ? A mix of moderate-intensity and vigorous-intensity exercise every week.  Children, pregnant women, people who are out of shape, people who are overweight, and older adults may need to consult a health care provider for individual recommendations. If you have any sort of medical condition, be sure to consult your health care provider before starting a new exercise program. What are some activities that can help me to lose weight?  Walking at a rate of at least 4.5 miles an hour.  Jogging or running at a rate of 5 miles per hour.  Biking at a rate of at least 10 miles per hour.  Lap swimming.  Roller-skating or in-line skating.  Cross-country skiing.  Vigorous competitive sports, such as football, basketball, and soccer.  Jumping rope.  Aerobic dancing. How can I be more active in my day-to-day  activities?  Use the stairs instead of the elevator.  Take a walk during your lunch break.  If you drive, park your car farther away from work or school.  If you take public transportation, get off one stop early and walk the rest of the way.  Make all of your phone calls while standing up and walking around.  Get up, stretch, and walk around every 30 minutes throughout the day. What guidelines should I follow while exercising?  Do not exercise so much that you hurt yourself, feel dizzy, or get very short of breath.  Consult your health care provider prior to starting a new exercise program.  Wear comfortable clothes and shoes with good support.  Drink plenty of water while you exercise to prevent dehydration or heat stroke. Body water is lost during exercise and must be replaced.  Work out until you breathe faster and your heart beats faster. This information is not intended to replace advice given to you by your health care provider. Make sure you discuss any questions you have with your health care provider. Document Released: 03/29/2010 Document Revised: 08/02/2015 Document Reviewed: 07/28/2013 Elsevier Interactive Patient Education  2018 [employer]  

## 2017-03-13 NOTE — Progress Notes (Signed)
Patient ID: Cheryl Owens, female   DOB: June 20, 1965, 52 y.o.   MRN: [id number]    Subjective:  I acted as a Education administrator for Dr. [name].  Cheryl Owens, Cheryl Owens   Patient ID: Cheryl Owens, female    DOB: 12-03-1965, 52 y.o.   MRN: [id number]  Chief Complaint  Patient presents with  . Pre-visit Screening Tool Documentation  . Hypertension    HPI  Patient is in today for follow up blood pressure and recheck kidney function.  She has been off the belviq for several weeks   Patient Care Team: [name], Alferd Apa, DO as PCP - General (Family Medicine) Princess Bruins, MD as Consulting Physician (Obstetrics and Gynecology)   Past Medical History:  Diagnosis Date  . Anxiety   . Depression   . Fibroids   . HTN (hypertension) 2007   seen in ER  with headaches  . Hypertension   . Hypokalemia 2007   due to HCTZ    Past Surgical History:  Procedure Laterality Date  . arm surgery     post trauma @ age 79  . BREAST SURGERY Right 2016   BREAST BX   . CHOLECYSTECTOMY     gall stones  . WISDOM TOOTH EXTRACTION      Family History  Problem Relation Age of Onset  . Cancer Maternal Grandmother        thyroid  . Hypertension Maternal Grandmother   . Hyperlipidemia Mother   . Hypertension Mother   . Breast cancer Maternal Aunt   . Kidney disease Maternal Aunt         X 2; 1 on Dialysis  . Cancer Maternal Aunt        ? primary; also had HTN  . Prostate cancer Paternal Uncle          3 uncles  . Prostate cancer Father   . Hypertension Paternal Grandmother   . Hypertension Maternal Aunt        X 2  . Bipolar disorder Paternal Aunt   . Stroke Neg Hx   . Diabetes Neg Hx   . Heart disease Neg Hx   . Colon cancer Neg Hx     Social History   Socioeconomic History  . Marital status: Single    Spouse name: Not on file  . Number of children: Not on file  . Years of education: Not on file  . Highest education level: Not on file  Social Needs  . Financial resource strain: Not on file    . Food insecurity - worry: Not on file  . Food insecurity - inability: Not on file  . Transportation needs - medical: Not on file  . Transportation needs - non-medical: Not on file  Occupational History  . Not on file  Tobacco Use  . Smoking status: Never Smoker  . Smokeless tobacco: Never Used  Substance and Sexual Activity  . Alcohol use: No  . Drug use: No  . Sexual activity: No  Other Topics Concern  . Not on file  Social History Narrative  . Not on file    Outpatient Medications Prior to Visit  Medication Sig Dispense Refill  . buPROPion (WELLBUTRIN XL) 300 MG 24 hr tablet TAKE 1 TABLET DAILY (OFFICE VISIT DUE NOW) 90 tablet 1  . clonazePAM (KLONOPIN) 1 MG tablet Take .5 in the morning and 1 at bedtime 45 tablet 0  . escitalopram (LEXAPRO) 10 MG tablet TAKE 1 TABLET DAILY 90 tablet 1  .  [name] 0.35 MG tablet Take 1 tablet by mouth daily.    Marland Kitchen labetalol (NORMODYNE) 100 MG tablet Take 1 tablet (100 mg total) by mouth 2 (two) times daily. 60 tablet 0  . Leuprolide Acetate, 3 Month, (LUPRON DEPOT, 80-MONTH, IM) Inject 1 each into the muscle every 3 (three) months.    . naproxen (EC NAPROSYN) 500 MG EC tablet Take 1 tablet (500 mg total) by mouth 2 (two) times daily with a meal. 30 tablet 0  . spironolactone (ALDACTONE) 25 MG tablet Take 1 tablet (25 mg total) by mouth 2 (two) times daily. 180 tablet 1  . BELVIQ XR 20 MG TB24     . NONFORMULARY OR COMPOUNDED ITEM Weight watchers ---for weight loss, bmi 41 1 each 0  . NONFORMULARY OR COMPOUNDED ITEM Gym membership with trainer   Dx obesity , bmi 41 1 each 11  . norethindrone (MICRONOR,[name],ERRIN) 0.35 MG tablet Take 1 tablet (0.35 mg total) by mouth daily. 3 Package 2   No facility-administered medications prior to visit.     Allergies  Allergen Reactions  . Hydrochlorothiazide     REACTION: hypokalemia  . Benazepril     Nausea& vomiting, abd cramps    Review of Systems  Constitutional: Negative for chills, fever and  malaise/fatigue.  HENT: Negative for congestion and hearing loss.   Eyes: Negative for discharge.  Respiratory: Negative for cough, sputum production and shortness of breath.   Cardiovascular: Negative for chest pain, palpitations and leg swelling.  Gastrointestinal: Negative for abdominal pain, blood in stool, constipation, diarrhea, heartburn, nausea and vomiting.  Genitourinary: Negative for dysuria, frequency, hematuria and urgency.  Musculoskeletal: Negative for back pain, falls and myalgias.  Skin: Negative for rash.  Neurological: Negative for dizziness, sensory change, loss of consciousness, weakness and headaches.  Endo/Heme/Allergies: Negative for environmental allergies. Does not bruise/bleed easily.  Psychiatric/Behavioral: Negative for depression and suicidal ideas. The patient is not nervous/anxious and does not have insomnia.        Objective:    Physical Exam  Constitutional: She is oriented to person, place, and time. She appears well-developed and well-nourished.  HENT:  Head: Normocephalic and atraumatic.  Eyes: Conjunctivae and EOM are normal.  Neck: Normal range of motion. Neck supple. No JVD present. Carotid bruit is not present. No thyromegaly present.  Cardiovascular: Normal rate, regular rhythm and normal heart sounds.  No murmur heard. Pulmonary/Chest: Effort normal and breath sounds normal. No respiratory distress. She has no wheezes. She has no rales. She exhibits no tenderness.  Musculoskeletal: She exhibits no edema.  Neurological: She is alert and oriented to person, place, and time.  Psychiatric: She has a normal mood and affect.  Nursing note and vitals reviewed.   BP 118/76 (BP Location: Right Arm, Cuff Size: Large)   Pulse 63   Temp 98.2 F (36.8 C) (Oral)   Resp 16   Ht 5\' 7"  (1.702 m)   Wt 235 lb 9.6 oz (106.9 kg)   SpO2 98%   BMI 36.90 kg/m  Wt Readings from Last 3 Encounters:  03/13/17 235 lb 9.6 oz (106.9 kg)  01/21/17 239 lb (108.4  kg)  09/08/16 251 lb 6 oz (114 kg)   BP Readings from Last 3 Encounters:  03/13/17 118/76  09/08/16 120/82  08/08/16 (!) 144/75     Immunization History  Administered Date(s) Administered  . Influenza Whole 12/09/2011, 12/09/2012  . Influenza, Seasonal, Injecte, Preservative Fre 01/03/2015  . Influenza-Unspecified 01/12/2017  . Td 09/28/2008  .  Zoster Recombinat (Shingrix) 03/13/2017    Health Maintenance  Topic Date Due  . PAP SMEAR  02/14/2017  . HIV Screening  03/14/2027 (Originally 05/11/1980)  . MAMMOGRAM  03/14/2017  . TETANUS/TDAP  09/29/2018  . COLONOSCOPY  03/21/2026  . INFLUENZA VACCINE  Completed    Lab Results  Component Value Date   WBC 8.2 02/19/2017   HGB 12.3 02/19/2017   HCT 37.9 02/19/2017   PLT 287 02/19/2017   GLUCOSE 90 02/19/2017   CHOL 197 02/19/2017   TRIG 87 02/19/2017   HDL 49 (L) 02/19/2017   LDLCALC 131 (H) 03/04/2016   ALT 13 02/19/2017   AST 15 02/19/2017   NA 141 02/19/2017   K 4.1 02/19/2017   CL 108 02/19/2017   CREATININE 1.28 (H) 02/19/2017   BUN 10 02/19/2017   CO2 25 02/19/2017   TSH 1.70 02/19/2017   HGBA1C 5.4 04/19/2015   MICROALBUR 3.1 (H) 05/05/2007    Lab Results  Component Value Date   TSH 1.70 02/19/2017   Lab Results  Component Value Date   WBC 8.2 02/19/2017   HGB 12.3 02/19/2017   HCT 37.9 02/19/2017   MCV 84.0 02/19/2017   PLT 287 02/19/2017   Lab Results  Component Value Date   NA 141 02/19/2017   K 4.1 02/19/2017   CO2 25 02/19/2017   GLUCOSE 90 02/19/2017   BUN 10 02/19/2017   CREATININE 1.28 (H) 02/19/2017   BILITOT 0.4 02/19/2017   ALKPHOS 94 03/04/2016   AST 15 02/19/2017   ALT 13 02/19/2017   PROT 6.5 02/19/2017   ALBUMIN 4.0 03/04/2016   CALCIUM 9.5 02/19/2017   ANIONGAP 13 09/26/2013   GFR 59.80 (L) 03/04/2016   Lab Results  Component Value Date   CHOL 197 02/19/2017   Lab Results  Component Value Date   HDL 49 (L) 02/19/2017   Lab Results  Component Value Date   LDLCALC  131 (H) 03/04/2016   Lab Results  Component Value Date   TRIG 87 02/19/2017   Lab Results  Component Value Date   CHOLHDL 4.0 02/19/2017   Lab Results  Component Value Date   HGBA1C 5.4 04/19/2015         Assessment & Plan:   Problem List Items Addressed This Visit      Unprioritized   Elevated serum creatinine    Repeat today      Essential hypertension    Well controlled, no changes to meds. Encouraged heart healthy diet such as the DASH diet and exercise as tolerated.       Relevant Medications   spironolactone (ALDACTONE) 25 MG tablet   Hyperlipidemia LDL goal <100 - Primary    Encouraged heart healthy diet, increase exercise, avoid trans fats, consider a krill oil cap daily      Relevant Medications   spironolactone (ALDACTONE) 25 MG tablet   Other Relevant Orders   Comprehensive metabolic panel   Lipid panel    Other Visit Diagnoses    Severe obesity (BMI >= 40) (HCC)       Relevant Medications   NONFORMULARY OR COMPOUNDED ITEM   NONFORMULARY OR COMPOUNDED ITEM   Need for shingles vaccine       Relevant Orders   Varicella-zoster vaccine IM (Shingrix) (Completed)      I have discontinued Larayah M. Maranan's BELVIQ XR and norethindrone. I am also having her maintain her [name], clonazePAM, labetalol, naproxen, escitalopram, buPROPion, (Leuprolide Acetate, 3 Month, (LUPRON DEPOT, 32-MONTH, IM)), spironolactone,  NONFORMULARY OR COMPOUNDED ITEM, and NONFORMULARY OR COMPOUNDED ITEM.  Meds ordered this encounter  Medications  . spironolactone (ALDACTONE) 25 MG tablet    Sig: Take 1 tablet (25 mg total) by mouth 2 (two) times daily.    Dispense:  180 tablet    Refill:  3  . NONFORMULARY OR COMPOUNDED ITEM    Sig: Weight watchers ---for weight loss, bmi 41    Dispense:  1 each    Refill:  0  . NONFORMULARY OR COMPOUNDED ITEM    Sig: Gym membership with trainer   Dx obesity , bmi 66    Dispense:  1 each    Refill:  43    CMA served as scribe during this  visit. History, Physical and Plan performed by medical provider. Documentation and orders reviewed and attested to.  Ann Held, DO

## 2017-03-13 NOTE — Assessment & Plan Note (Signed)
Encouraged heart healthy diet, increase exercise, avoid trans fats, consider a krill oil cap daily 

## 2017-03-13 NOTE — Assessment & Plan Note (Signed)
Repeat today

## 2017-03-13 NOTE — Assessment & Plan Note (Signed)
Well controlled, no changes to meds. Encouraged heart healthy diet such as the DASH diet and exercise as tolerated.  °

## 2017-03-16 ENCOUNTER — Encounter: Payer: Self-pay | Admitting: Family Medicine

## 2017-03-16 DIAGNOSIS — I10 Essential (primary) hypertension: Secondary | ICD-10-CM

## 2017-03-17 DIAGNOSIS — Z6837 Body mass index (BMI) 37.0-37.9, adult: Secondary | ICD-10-CM | POA: Diagnosis not present

## 2017-03-17 DIAGNOSIS — E669 Obesity, unspecified: Secondary | ICD-10-CM | POA: Diagnosis not present

## 2017-03-17 DIAGNOSIS — N289 Disorder of kidney and ureter, unspecified: Secondary | ICD-10-CM | POA: Diagnosis not present

## 2017-03-17 NOTE — Telephone Encounter (Signed)
Creatinine clearance (calculated) was normal-- she she can start it  We should repeat it in 2-3 weeks   ---bmp

## 2017-03-17 NOTE — Telephone Encounter (Signed)
Creatinine clearance normal --- so she can restart Recheck 2-3 week s---  bmp

## 2017-03-18 ENCOUNTER — Other Ambulatory Visit: Payer: Self-pay

## 2017-03-18 ENCOUNTER — Encounter: Payer: Self-pay | Admitting: Family Medicine

## 2017-03-18 DIAGNOSIS — E785 Hyperlipidemia, unspecified: Secondary | ICD-10-CM

## 2017-03-18 DIAGNOSIS — I10 Essential (primary) hypertension: Secondary | ICD-10-CM

## 2017-03-18 MED ORDER — ATORVASTATIN CALCIUM 20 MG PO TABS: 20.0000 mg | ORAL_TABLET | Freq: Every day | ORAL | 3 refills | Status: DC

## 2017-03-19 NOTE — Telephone Encounter (Signed)
She can take tylenol I already said she could take the belviq-- recheck bmp 1 month

## 2017-03-23 ENCOUNTER — Other Ambulatory Visit: Payer: Self-pay | Admitting: Family Medicine

## 2017-03-25 ENCOUNTER — Other Ambulatory Visit: Payer: Self-pay | Admitting: Family Medicine

## 2017-03-25 ENCOUNTER — Encounter: Payer: Self-pay | Admitting: Family Medicine

## 2017-03-25 DIAGNOSIS — E785 Hyperlipidemia, unspecified: Secondary | ICD-10-CM

## 2017-03-25 DIAGNOSIS — I10 Essential (primary) hypertension: Secondary | ICD-10-CM

## 2017-03-25 MED ORDER — ATORVASTATIN CALCIUM 20 MG PO TABS: 20.0000 mg | ORAL_TABLET | Freq: Every day | ORAL | 3 refills | Status: DC

## 2017-03-25 MED ORDER — SPIRONOLACTONE 25 MG PO TABS: 25.0000 mg | ORAL_TABLET | Freq: Two times a day (BID) | ORAL | 3 refills | Status: DC

## 2017-03-25 MED ORDER — ATORVASTATIN CALCIUM 20 MG PO TABS
20.0000 mg | ORAL_TABLET | Freq: Every day | ORAL | 3 refills | Status: DC
Start: 2017-03-25 — End: 2017-03-25

## 2017-03-25 NOTE — Telephone Encounter (Signed)
Pt wanting to know if she should be taking aspirin 81mg  daily?

## 2017-03-26 ENCOUNTER — Ambulatory Visit (INDEPENDENT_AMBULATORY_CARE_PROVIDER_SITE_OTHER): Payer: BLUE CROSS/BLUE SHIELD | Admitting: Obstetrics & Gynecology

## 2017-03-26 ENCOUNTER — Other Ambulatory Visit: Payer: Self-pay | Admitting: Obstetrics & Gynecology

## 2017-03-26 ENCOUNTER — Encounter: Payer: Self-pay | Admitting: Obstetrics & Gynecology

## 2017-03-26 VITALS — BP 132/84 | Ht 66.0 in | Wt 235.0 lb

## 2017-03-26 DIAGNOSIS — Z01411 Encounter for gynecological examination (general) (routine) with abnormal findings: Secondary | ICD-10-CM

## 2017-03-26 DIAGNOSIS — Z6837 Body mass index (BMI) 37.0-37.9, adult: Secondary | ICD-10-CM | POA: Diagnosis not present

## 2017-03-26 DIAGNOSIS — E6609 Other obesity due to excess calories: Secondary | ICD-10-CM | POA: Diagnosis not present

## 2017-03-26 DIAGNOSIS — R87610 Atypical squamous cells of undetermined significance on cytologic smear of cervix (ASC-US): Secondary | ICD-10-CM | POA: Diagnosis not present

## 2017-03-26 DIAGNOSIS — Z3041 Encounter for surveillance of contraceptive pills: Secondary | ICD-10-CM | POA: Diagnosis not present

## 2017-03-26 DIAGNOSIS — Z1231 Encounter for screening mammogram for malignant neoplasm of breast: Secondary | ICD-10-CM

## 2017-03-26 DIAGNOSIS — D219 Benign neoplasm of connective and other soft tissue, unspecified: Secondary | ICD-10-CM | POA: Diagnosis not present

## 2017-03-26 MED ORDER — HEATHER 0.35 MG PO TABS: 1.0000 | ORAL_TABLET | Freq: Every day | ORAL | 4 refills | Status: DC

## 2017-03-26 NOTE — Progress Notes (Signed)
Cheryl Owens April 18, 1965 [id number]   History:    52 y.o. G4P1A3  Single  RP:  Established patient presenting for annual gyn exam  HPI: No menses, had the 3 month DepoLupron injection x 2, last dose about 10/2016 for Menorrhagia secondary to Fibroids.  Well on Progestin-only pill currently.  Nelson non menopausal 02/2017.  No pelvic pain.  Not sexually active in the last year.  Followed by Gustavo Lah for weight management.  Will restart Belviq given normal kidney function.  Exercising regularly.  BMI 37.93.  Breasts wnl.  Urine/BMs wnl.      Past medical history,surgical history, family history and social history were all reviewed and documented in the EPIC chart.  Gynecologic History No LMP recorded. Patient has had an injection. Contraception: oral progesterone-only contraceptive Last Pap: 05/2016. Results were: Negative Last mammogram: 03/2016. Results were: Negative  Obstetric History OB History  Gravida Para Term Preterm AB Living  4 1     3 1   SAB TAB Ectopic Multiple Live Births               # Outcome Date GA Lbr Len/2nd Weight Sex Delivery Anes PTL Lv  4 AB           3 AB           2 AB           1 Para                ROS: A ROS was performed and pertinent positives and negatives are included in the history.  GENERAL: No fevers or chills. HEENT: No change in vision, no earache, sore throat or sinus congestion. NECK: No pain or stiffness. CARDIOVASCULAR: No chest pain or pressure. No palpitations. PULMONARY: No shortness of breath, cough or wheeze. GASTROINTESTINAL: No abdominal pain, nausea, vomiting or diarrhea, Owens or bright red blood per rectum. GENITOURINARY: No urinary frequency, urgency, hesitancy or dysuria. MUSCULOSKELETAL: No joint or muscle pain, no back pain, no recent trauma. DERMATOLOGIC: No rash, no itching, no lesions. ENDOCRINE: No polyuria, polydipsia, no heat or cold intolerance. No recent change in weight. HEMATOLOGICAL: No anemia or easy bruising or  bleeding. NEUROLOGIC: No headache, seizures, numbness, tingling or weakness. PSYCHIATRIC: No depression, no loss of interest in normal activity or change in sleep pattern.     Exam:   Ht 5\' 6"  (1.676 m)   Wt 235 lb (106.6 kg)   BMI 37.93 kg/m   Body mass index is 37.93 kg/m.  General appearance : Well developed well nourished female. No acute distress HEENT: Eyes: no retinal hemorrhage or exudates,  Neck supple, trachea midline, no carotid bruits, no thyroidmegaly Lungs: Clear to auscultation, no rhonchi or wheezes, or rib retractions  Heart: Regular rate and rhythm, no murmurs or gallops Breast:Examined in sitting and supine position were symmetrical in appearance, no palpable masses or tenderness,  no skin retraction, no nipple inversion, no nipple discharge, no skin discoloration, no axillary or supraclavicular lymphadenopathy Abdomen: no palpable masses or tenderness, no rebound or guarding Extremities: no edema or skin discoloration or tenderness  Pelvic: Vulva normal  Bartholin, Urethra, Skene Glands: Within normal limits             Vagina: No gross lesions or discharge  Cervix: No gross lesions or discharge.  Pap reflex done.  Uterus  AV, nodular with fibroids, about 12 cm, non-tender and mobile  Adnexa  Without masses or tenderness  Anus and perineum  normal  Assessment/Plan:  52 y.o. female for annual exam   1. Encounter for gynecological examination with abnormal finding Gynecologic exam with fibroid uterus.  Pap reflex done.  Breast exam normal.  Will schedule screening mammogram at the breast center.  Colonoscopy January 2018.  Health labs with family physician.  2. Fibroids F/U Pelvic US to reassess fibroids post DepoLupron 3 months x 2 injections.  3. Encounter for surveillance of contraceptive pills Well on progestin only pill.  No contraindication.  Progestin only pill represcribed.  4. Class 2 obesity due to excess calories without serious comorbidity  with body mass index (BMI) of 37.0 to 37.9 in adult Will restart to Belviq now that renal function confirmed to be normal.  Low calorie/low carb diet.  Regular aerobic and weightlifting physical activity.  Follow-up with Gustavo Lah.  Other orders - [name] 0.35 MG tablet; Take 1 tablet (0.35 mg total) by mouth daily.  Counseling on above issues more than 50% for 10 minutes.  Princess Bruins MD, 8:22 AM 03/26/2017

## 2017-03-26 NOTE — Addendum Note (Signed)
Addended by: Thurnell Garbe A on: 03/26/2017 10:38 AM   Modules accepted: Orders

## 2017-03-26 NOTE — Patient Instructions (Signed)
1. Encounter for gynecological examination with abnormal finding Gynecologic exam with fibroid uterus.  Pap reflex done.  Breast exam normal.  Will schedule screening mammogram at the breast center.  Colonoscopy January 2018.  Health labs with family physician.  2. Fibroids F/U Pelvic US to reassess fibroids post DepoLupron 3 months x 2 injections.  3. Encounter for surveillance of contraceptive pills Well on progestin only pill.  No contraindication.  Progestin only pill represcribed.  4. Class 2 obesity due to excess calories without serious comorbidity with body mass index (BMI) of 37.0 to 37.9 in adult Will restart to [place] now that renal function confirmed to be normal.  Low calorie/low carb diet.  Regular aerobic and weightlifting physical activity.  Follow-up with Gustavo Lah.  Other orders - [name] 0.35 MG tablet; Take 1 tablet (0.35 mg total) by mouth daily.  Cheryl Owens, good seeing you today!  I will inform you of your results as soon as they are available.  See you again soon for the pelvic ultrasound.  Health Maintenance, Female Adopting a healthy lifestyle and getting preventive care can go a long way to promote health and wellness. Talk with your health care provider about what schedule of regular examinations is right for you. This is a good chance for you to check in with your provider about disease prevention and staying healthy. In between checkups, there are plenty of things you can do on your own. Experts have done a lot of research about which lifestyle changes and preventive measures are most likely to keep you healthy. Ask your health care provider for more information. Weight and diet Eat a healthy diet  Be sure to include plenty of vegetables, fruits, low-fat dairy products, and lean protein.  Do not eat a lot of foods high in solid fats, added sugars, or salt.  Get regular exercise. This is one of the most important things you can do for your health. ? Most adults  should exercise for at least 150 minutes each week. The exercise should increase your heart rate and make you sweat (moderate-intensity exercise). ? Most adults should also do strengthening exercises at least twice a week. This is in addition to the moderate-intensity exercise.  Maintain a healthy weight  Body mass index (BMI) is a measurement that can be used to identify possible weight problems. It estimates body fat based on height and weight. Your health care provider can help determine your BMI and help you achieve or maintain a healthy weight.  For females 17 years of age and older: ? A BMI below 18.5 is considered underweight. ? A BMI of 18.5 to 24.9 is normal. ? A BMI of 25 to 29.9 is considered overweight. ? A BMI of 30 and above is considered obese.  Watch levels of cholesterol and blood lipids  You should start having your blood tested for lipids and cholesterol at 52 years of age, then have this test every 5 years.  You may need to have your cholesterol levels checked more often if: ? Your lipid or cholesterol levels are high. ? You are older than 52 years of age. ? You are at high risk for heart disease.  Cancer screening Lung Cancer  Lung cancer screening is recommended for adults 36-11 years old who are at high risk for lung cancer because of a history of smoking.  A yearly low-dose CT scan of the lungs is recommended for people who: ? Currently smoke. ? Have quit within the past 15 years. ?  Have at least a 30-pack-year history of smoking. A pack year is smoking an average of one pack of cigarettes a day for 1 year.  Yearly screening should continue until it has been 15 years since you quit.  Yearly screening should stop if you develop a health problem that would prevent you from having lung cancer treatment.  Breast Cancer  Practice breast self-awareness. This means understanding how your breasts normally appear and feel.  It also means doing regular breast  self-exams. Let your health care provider know about any changes, no matter how small.  If you are in your 20s or 30s, you should have a clinical breast exam (CBE) by a health care provider every 1-3 years as part of a regular health exam.  If you are 17 or older, have a CBE every year. Also consider having a breast X-ray (mammogram) every year.  If you have a family history of breast cancer, talk to your health care provider about genetic screening.  If you are at high risk for breast cancer, talk to your health care provider about having an MRI and a mammogram every year.  Breast cancer gene (BRCA) assessment is recommended for women who have family members with BRCA-related cancers. BRCA-related cancers include: ? Breast. ? Ovarian. ? Tubal. ? Peritoneal cancers.  Results of the assessment will determine the need for genetic counseling and BRCA1 and BRCA2 testing.  Cervical Cancer Your health care provider may recommend that you be screened regularly for cancer of the pelvic organs (ovaries, uterus, and vagina). This screening involves a pelvic examination, including checking for microscopic changes to the surface of your cervix (Pap test). You may be encouraged to have this screening done every 3 years, beginning at age 25.  For women ages 40-65, health care providers may recommend pelvic exams and Pap testing every 3 years, or they may recommend the Pap and pelvic exam, combined with testing for human papilloma virus (HPV), every 5 years. Some types of HPV increase your risk of cervical cancer. Testing for HPV may also be done on women of any age with unclear Pap test results.  Other health care providers may not recommend any screening for nonpregnant women who are considered low risk for pelvic cancer and who do not have symptoms. Ask your health care provider if a screening pelvic exam is right for you.  If you have had past treatment for cervical cancer or a condition that could  lead to cancer, you need Pap tests and screening for cancer for at least 20 years after your treatment. If Pap tests have been discontinued, your risk factors (such as having a new sexual partner) need to be reassessed to determine if screening should resume. Some women have medical problems that increase the chance of getting cervical cancer. In these cases, your health care provider may recommend more frequent screening and Pap tests.  Colorectal Cancer  This type of cancer can be detected and often prevented.  Routine colorectal cancer screening usually begins at 52 years of age and continues through 52 years of age.  Your health care provider may recommend screening at an earlier age if you have risk factors for colon cancer.  Your health care provider may also recommend using home test kits to check for hidden blood in the stool.  A small camera at the end of a tube can be used to examine your colon directly (sigmoidoscopy or colonoscopy). This is done to check for the earliest forms of colorectal  cancer.  Routine screening usually begins at age 34.  Direct examination of the colon should be repeated every 5-10 years through 52 years of age. However, you may need to be screened more often if early forms of precancerous polyps or small growths are found.  Skin Cancer  Check your skin from head to toe regularly.  Tell your health care provider about any new moles or changes in moles, especially if there is a change in a mole's shape or color.  Also tell your health care provider if you have a mole that is larger than the size of a pencil eraser.  Always use sunscreen. Apply sunscreen liberally and repeatedly throughout the day.  Protect yourself by wearing long sleeves, pants, a wide-brimmed hat, and sunglasses whenever you are outside.  Heart disease, diabetes, and high blood pressure  High blood pressure causes heart disease and increases the risk of stroke. High blood pressure  is more likely to develop in: ? People who have blood pressure in the high end of the normal range (130-139/85-89 mm Hg). ? People who are overweight or obese. ? People who are African American.  If you are 92-65 years of age, have your blood pressure checked every 3-5 years. If you are 70 years of age or older, have your blood pressure checked every year. You should have your blood pressure measured twice-once when you are at a hospital or clinic, and once when you are not at a hospital or clinic. Record the average of the two measurements. To check your blood pressure when you are not at a hospital or clinic, you can use: ? An automated blood pressure machine at a pharmacy. ? A home blood pressure monitor.  If you are between 72 years and 50 years old, ask your health care provider if you should take aspirin to prevent strokes.  Have regular diabetes screenings. This involves taking a blood sample to check your fasting blood sugar level. ? If you are at a normal weight and have a low risk for diabetes, have this test once every three years after 52 years of age. ? If you are overweight and have a high risk for diabetes, consider being tested at a younger age or more often. Preventing infection Hepatitis B  If you have a higher risk for hepatitis B, you should be screened for this virus. You are considered at high risk for hepatitis B if: ? You were born in a country where hepatitis B is common. Ask your health care provider which countries are considered high risk. ? Your parents were born in a high-risk country, and you have not been immunized against hepatitis B (hepatitis B vaccine). ? You have HIV or AIDS. ? You use needles to inject street drugs. ? You live with someone who has hepatitis B. ? You have had sex with someone who has hepatitis B. ? You get hemodialysis treatment. ? You take certain medicines for conditions, including cancer, organ transplantation, and autoimmune  conditions.  Hepatitis C  Blood testing is recommended for: ? Everyone born from [birth year] through 1965. ? Anyone with known risk factors for hepatitis C.  Sexually transmitted infections (STIs)  You should be screened for sexually transmitted infections (STIs) including gonorrhea and chlamydia if: ? You are sexually active and are younger than 52 years of age. ? You are older than 53 years of age and your health care provider tells you that you are at risk for this type of infection. ? Your  sexual activity has changed since you were last screened and you are at an increased risk for chlamydia or gonorrhea. Ask your health care provider if you are at risk.  If you do not have HIV, but are at risk, it may be recommended that you take a prescription medicine daily to prevent HIV infection. This is called pre-exposure prophylaxis (PrEP). You are considered at risk if: ? You are sexually active and do not regularly use condoms or know the HIV status of your partner(s). ? You take drugs by injection. ? You are sexually active with a partner who has HIV.  Talk with your health care provider about whether you are at high risk of being infected with HIV. If you choose to begin PrEP, you should first be tested for HIV. You should then be tested every 3 months for as long as you are taking PrEP. Pregnancy  If you are premenopausal and you may become pregnant, ask your health care provider about preconception counseling.  If you may become pregnant, take 400 to 800 micrograms (mcg) of folic acid every day.  If you want to prevent pregnancy, talk to your health care provider about birth control (contraception). Osteoporosis and menopause  Osteoporosis is a disease in which the bones lose minerals and strength with aging. This can result in serious bone fractures. Your risk for osteoporosis can be identified using a bone density scan.  If you are 8 years of age or older, or if you are at risk for  osteoporosis and fractures, ask your health care provider if you should be screened.  Ask your health care provider whether you should take a calcium or vitamin D supplement to lower your risk for osteoporosis.  Menopause may have certain physical symptoms and risks.  Hormone replacement therapy may reduce some of these symptoms and risks. Talk to your health care provider about whether hormone replacement therapy is right for you. Follow these instructions at home:  Schedule regular health, dental, and eye exams.  Stay current with your immunizations.  Do not use any tobacco products including cigarettes, chewing tobacco, or electronic cigarettes.  If you are pregnant, do not drink alcohol.  If you are breastfeeding, limit how much and how often you drink alcohol.  Limit alcohol intake to no more than 1 drink per day for nonpregnant women. One drink equals 12 ounces of beer, 5 ounces of wine, or 1 ounces of hard liquor.  Do not use street drugs.  Do not share needles.  Ask your health care provider for help if you need support or information about quitting drugs.  Tell your health care provider if you often feel depressed.  Tell your health care provider if you have ever been abused or do not feel safe at home. This information is not intended to replace advice given to you by your health care provider. Make sure you discuss any questions you have with your health care provider. Document Released: 09/09/2010 Document Revised: 08/02/2015 Document Reviewed: 11/28/2014 Elsevier Interactive Patient Education  [employer].

## 2017-03-30 DIAGNOSIS — Z1231 Encounter for screening mammogram for malignant neoplasm of breast: Secondary | ICD-10-CM | POA: Diagnosis not present

## 2017-03-30 LAB — HM MAMMOGRAPHY

## 2017-03-31 LAB — PAP IG W/ RFLX HPV ASCU

## 2017-03-31 LAB — HUMAN PAPILLOMAVIRUS, HIGH RISK: HPV DNA HIGH RISK: NOT DETECTED

## 2017-04-14 ENCOUNTER — Encounter: Payer: BLUE CROSS/BLUE SHIELD | Admitting: Family Medicine

## 2017-04-15 DIAGNOSIS — F322 Major depressive disorder, single episode, severe without psychotic features: Secondary | ICD-10-CM | POA: Diagnosis not present

## 2017-04-22 ENCOUNTER — Ambulatory Visit (INDEPENDENT_AMBULATORY_CARE_PROVIDER_SITE_OTHER): Payer: BLUE CROSS/BLUE SHIELD

## 2017-04-22 ENCOUNTER — Ambulatory Visit: Payer: BLUE CROSS/BLUE SHIELD | Admitting: Obstetrics & Gynecology

## 2017-04-22 ENCOUNTER — Other Ambulatory Visit: Payer: Self-pay | Admitting: Obstetrics & Gynecology

## 2017-04-22 ENCOUNTER — Other Ambulatory Visit: Payer: Self-pay | Admitting: Gynecology

## 2017-04-22 DIAGNOSIS — D251 Intramural leiomyoma of uterus: Secondary | ICD-10-CM

## 2017-04-22 DIAGNOSIS — D252 Subserosal leiomyoma of uterus: Secondary | ICD-10-CM

## 2017-04-22 DIAGNOSIS — N83201 Unspecified ovarian cyst, right side: Secondary | ICD-10-CM

## 2017-04-22 DIAGNOSIS — D219 Benign neoplasm of connective and other soft tissue, unspecified: Secondary | ICD-10-CM | POA: Diagnosis not present

## 2017-04-22 DIAGNOSIS — D259 Leiomyoma of uterus, unspecified: Secondary | ICD-10-CM

## 2017-04-22 DIAGNOSIS — N852 Hypertrophy of uterus: Secondary | ICD-10-CM

## 2017-04-22 DIAGNOSIS — R6889 Other general symptoms and signs: Secondary | ICD-10-CM

## 2017-04-22 DIAGNOSIS — R198 Other specified symptoms and signs involving the digestive system and abdomen: Secondary | ICD-10-CM

## 2017-04-22 NOTE — Progress Notes (Signed)
    Cheryl Owens 01-Apr-1965 [id number]        52 y.o.  [id number]   RP: Fibroids post 2 doses of Depo-Lupron for pelvic ultrasound  HPI: Post Depo-Lupron 55-month dose x2.  Well on progestin only pill currently.  No current menses or any vaginal bleeding.  No pelvic pain.  Large fibroids are asymptomatic.   OB History  Gravida Para Term Preterm AB Living  4 1     3 1   SAB TAB Ectopic Multiple Live Births               # Outcome Date GA Lbr Len/2nd Weight Sex Delivery Anes PTL Lv  4 AB           3 AB           2 AB           1 Para               Past medical history,surgical history, problem list, medications, allergies, family history and social history were all reviewed and documented in the EPIC chart.   Directed ROS with pertinent positives and negatives documented in the history of present illness/assessment and plan.  Exam:  There were no vitals filed for this visit. General appearance:  Normal  Pelvic US T/V and T/A images.  Anteverted uterus measuring 16.11today: By 14.57 x 9.12 cm.  Intramural and subserous fibroids sampling 3.2 x 2.8 cm, 7.1 x 5.8 x 7.0 cm, 6.4 x 6.0 cm, 7.6 x 8.6 cm, on the left 3.3 x 3.7 cm, 4.4 x 4.9 cm.  Endometrial line not well seen.  Right and left ovary not well seen.  Suboptimal images.  Left adnexal tubular shaped fluid, probably left hydrosalpinx versus free fluid less likely measuring 9.8 x 3.2 x 5.1 cm.     Assessment/Plan:  52 y.o. [id number]   1. Fibroids  Multiple large uterine fibroids, not decreased in size post Depo-Lupron for 6 months, but asymptomatic with no menses and no vaginal bleeding and no abdominal pelvic pain on progestin only pill.  Patient prefers observation on continued progestin only pill at this point.  Will proceed with hysterectomy if becomes symptomatic again.  Counseling on above issues more than 50% for 15 minutes.  Princess Bruins MD, 4:47 PM 04/22/2017

## 2017-04-26 ENCOUNTER — Encounter: Payer: Self-pay | Admitting: Obstetrics & Gynecology

## 2017-04-26 NOTE — Patient Instructions (Signed)
1. Fibroids  Multiple large uterine fibroids, not decreased in size post Depo-Lupron for 6 months, but asymptomatic with no menses and no vaginal bleeding and no abdominal pelvic pain on progestin only pill.  Patient prefers observation on continued progestin only pill at this point.  Will proceed with hysterectomy if becomes symptomatic again.  [name], good seeing you today!

## 2017-04-28 DIAGNOSIS — Z6837 Body mass index (BMI) 37.0-37.9, adult: Secondary | ICD-10-CM | POA: Diagnosis not present

## 2017-04-28 DIAGNOSIS — E669 Obesity, unspecified: Secondary | ICD-10-CM | POA: Diagnosis not present

## 2017-04-29 ENCOUNTER — Other Ambulatory Visit (INDEPENDENT_AMBULATORY_CARE_PROVIDER_SITE_OTHER): Payer: BLUE CROSS/BLUE SHIELD

## 2017-04-29 DIAGNOSIS — I10 Essential (primary) hypertension: Secondary | ICD-10-CM

## 2017-04-29 DIAGNOSIS — E785 Hyperlipidemia, unspecified: Secondary | ICD-10-CM | POA: Diagnosis not present

## 2017-04-29 DIAGNOSIS — F322 Major depressive disorder, single episode, severe without psychotic features: Secondary | ICD-10-CM | POA: Diagnosis not present

## 2017-04-29 LAB — LIPID PANEL
CHOL/HDL RATIO: 3
Cholesterol: 130 mg/dL (ref 0–200)
HDL: 40.2 mg/dL (ref >39.00)
LDL CALC: 77 mg/dL (ref 0–99)
NonHDL: 89.32
Triglycerides: 62 mg/dL (ref 0.0–149.0)
VLDL: 12.4 mg/dL (ref 0.0–40.0)

## 2017-04-29 LAB — COMPREHENSIVE METABOLIC PANEL
ALBUMIN: 3.8 g/dL (ref 3.5–5.2)
ALT: 15 U/L (ref 0–35)
AST: 14 U/L (ref 0–37)
Alkaline Phosphatase: 97 U/L (ref 39–117)
BILIRUBIN TOTAL: 0.4 mg/dL (ref 0.2–1.2)
BUN: 12 mg/dL (ref 6–23)
CALCIUM: 9.5 mg/dL (ref 8.4–10.5)
CHLORIDE: 106 meq/L (ref 96–112)
CO2: 27 meq/L (ref 19–32)
CREATININE: 1.18 mg/dL (ref 0.40–1.20)
GFR: 61.87 mL/min (ref >60.00)
Glucose, Bld: 88 mg/dL (ref 70–99)
Potassium: 3.8 mEq/L (ref 3.5–5.1)
Sodium: 139 mEq/L (ref 135–145)
Total Protein: 7 g/dL (ref 6.0–8.3)

## 2017-04-30 ENCOUNTER — Ambulatory Visit (INDEPENDENT_AMBULATORY_CARE_PROVIDER_SITE_OTHER): Payer: BLUE CROSS/BLUE SHIELD | Admitting: Orthopedic Surgery

## 2017-04-30 ENCOUNTER — Ambulatory Visit (INDEPENDENT_AMBULATORY_CARE_PROVIDER_SITE_OTHER): Payer: Self-pay

## 2017-04-30 ENCOUNTER — Encounter (INDEPENDENT_AMBULATORY_CARE_PROVIDER_SITE_OTHER): Payer: Self-pay | Admitting: Orthopedic Surgery

## 2017-04-30 ENCOUNTER — Other Ambulatory Visit: Payer: Self-pay | Admitting: Family Medicine

## 2017-04-30 VITALS — BP 121/77 | HR 73 | Resp 14 | Ht 67.0 in | Wt 229.0 lb

## 2017-04-30 DIAGNOSIS — M7662 Achilles tendinitis, left leg: Secondary | ICD-10-CM | POA: Diagnosis not present

## 2017-04-30 DIAGNOSIS — I10 Essential (primary) hypertension: Secondary | ICD-10-CM

## 2017-04-30 DIAGNOSIS — M25572 Pain in left ankle and joints of left foot: Secondary | ICD-10-CM

## 2017-04-30 MED ORDER — DICLOFENAC SODIUM 1 % TD GEL: 2.0000 g | Freq: Four times a day (QID) | TRANSDERMAL | 1 refills | Status: DC

## 2017-04-30 NOTE — Progress Notes (Signed)
Office Visit Note   Patient: Cheryl Owens           Date of Birth: 11-07-1965           MRN: [id number] Visit Date: 04/30/2017              Requested by: [address] [place], [place] [address] [place], [place] [address] PCP: [name], Alferd Apa, DO   Assessment & Plan: Visit Diagnoses:  1. Pain in left ankle and joints of left foot   2. Achilles tendinitis, left leg     Plan:  #1: Use of her boot that she has with the addition of a arch support as well as a sore within heel pad.  She essentially has very minimal arch at this time. #2: Voltaren gel was sent to [facility] at [facility] #3: Stop the use of the treadmill with elevation #4: Gentle stretching exercises taught  Follow-Up Instructions: Return in about 2 weeks (around 05/14/2017).   Face-to-face time spent with patient was greater than 30 minutes.  Greater than 50% of the time was spent in counseling and coordination of care.  Discussion of the use of the treadmill without elevation as well as the use of the orthotic on a long-term basis was reiterated.  Also had a long discussion about may need to have surgery if indicated in the future.  Orders:  Orders Placed This Encounter  Procedures  . XR Ankle 2 Views Left   Meds ordered this encounter  Medications  . diclofenac sodium (VOLTAREN) 1 % GEL    Sig: Apply 2 g topically 4 (four) times daily.    Dispense:  3 Tube    Refill:  1    Order Specific Question:   Supervising Provider    Answer:   Garald Balding [[id number]]      Procedures: No procedures performed   Clinical Data: No additional findings.   Subjective: Chief Complaint  Patient presents with  . Left Ankle - Pain  . Ankle Pain    Achilles tendon problems, swelling, tenderness, difficulty walking, worsening pain, boot - Dr. Etter Sjogren 08/2016, no injury, not diabetic, no surgery to ankle, Advil doesn't help    HPI  Cheryl Owens is a pleasant 52 year old African-American female who is seen  today with a several day history of pain and discomfort in the posterior aspect of her heel.  She apparently had problems with this previously in June 2018.  She denies any history of injury or trauma.  She is to start having worsening pain discomfort.  She was in a boot when she came in today that of a half boot.  She could barely walk today.  Advil was not very helpful.  Again no history of injury or trauma.  But does have a previous history of this pain.  Review of Systems  Constitutional: Positive for activity change.  HENT: Negative for trouble swallowing.   Eyes: Negative for pain.  Respiratory: Negative for shortness of breath.   Cardiovascular: Negative for leg swelling.  Gastrointestinal: Negative for constipation.  Endocrine: Negative for cold intolerance.  Genitourinary: Negative for difficulty urinating.  Musculoskeletal: Positive for joint swelling.  Skin: Negative for rash.  Allergic/Immunologic: Negative for food allergies.  Neurological: Negative for weakness.  Hematological: Does not bruise/bleed easily.  Psychiatric/Behavioral: Negative for sleep disturbance.     Objective: Vital Signs: BP 121/77 (BP Location: Left Arm, Patient Position: Sitting, Cuff Size: Normal)   Pulse 73  Resp 14   Ht 5\' 7"  (1.702 m)   Wt 229 lb (103.9 kg)   BMI 35.87 kg/m   Physical Exam  Constitutional: She is oriented to person, place, and time. She appears well-developed and well-nourished.  HENT:  Head: Normocephalic and atraumatic.  Eyes: EOM are normal. Pupils are equal, round, and reactive to light.  Pulmonary/Chest: Effort normal.  Neurological: She is alert and oriented to person, place, and time.  Skin: Skin is warm and dry.  Psychiatric: She has a normal mood and affect. Her behavior is normal. Judgment and thought content normal.    Ortho Exam  Exam today reveals tenderness to palpation over the insertion of the Achilles tendon into the calcaneus.  I can only get her foot  to neutral with the knee in full extension.  She did not have a palpable defect approximately in the Achilles tendon.  May be a little bit of a defect at the midportion of the posterior calcaneus.  She is neurovascular intact distally.  No ecchymosis or erythema.  No lesions.  Specialty Comments:  No specialty comments available.  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  She does have calcification at the insertion of the Achilles tendon at the Haglund's deformity as well as at the midportion of the calcaneus.  Small inferior calcaneal spur.    PMFS History: Patient Active Problem List   Diagnosis Date Noted  . Elevated serum creatinine 03/13/2017  . Depression 04/19/2015  . Uterine fibroid 04/13/2014  . Plantar fasciitis 04/13/2014  . Positive urine drug screen 09/29/2013  . Nausea alone 09/13/2013  . Hypotension, unspecified 09/13/2013  . Anxiety state, unspecified 09/13/2013  . Diarrhea 09/13/2013  . Obesity (BMI 30-39.9) 02/15/2013  . FASTING HYPERGLYCEMIA 09/28/2008  . SNORING, HX OF 10/04/2007  . Hyperlipidemia LDL goal <100 05/05/2007  . Essential hypertension 05/05/2007  . METABOLIC SYNDROME X 13/10/6576   Past Medical History:  Diagnosis Date  . Anxiety   . Depression   . Fibroids   . High cholesterol   . HTN (hypertension) 2007   seen in ER  with headaches  . Hypertension   . Hypokalemia 2007   due to HCTZ    Family History  Problem Relation Age of Onset  . Cancer Maternal Grandmother        thyroid  . Hypertension Maternal Grandmother   . Hyperlipidemia Mother   . Hypertension Mother   . Breast cancer Maternal Aunt   . Kidney disease Maternal Aunt         X 2; 1 on Dialysis  . Cancer Maternal Aunt        ? primary; also had HTN  . Prostate cancer Paternal Uncle          3 uncles  . Prostate cancer Father   . Hypertension Paternal Grandmother   . Hypertension  Maternal Aunt        X 2  . Bipolar disorder Paternal Aunt   . Stroke Neg Hx   . Diabetes Neg Hx   . Heart disease Neg Hx   . Colon cancer Neg Hx     Past Surgical History:  Procedure Laterality Date  . arm surgery     post trauma @ age 54  . BREAST SURGERY Right 2016   BREAST BX   . CHOLECYSTECTOMY     gall stones  . WISDOM TOOTH EXTRACTION  Social History   Occupational History  . Not on file  Tobacco Use  . Smoking status: Never Smoker  . Smokeless tobacco: Never Used  Substance and Sexual Activity  . Alcohol use: No  . Drug use: No  . Sexual activity: No    Comment: 1st intercourse- 36, partners- 10

## 2017-05-04 ENCOUNTER — Encounter: Payer: Self-pay | Admitting: *Deleted

## 2017-05-04 MED ORDER — LABETALOL HCL 100 MG PO TABS: 100.0000 mg | ORAL_TABLET | Freq: Two times a day (BID) | ORAL | 1 refills | Status: DC

## 2017-05-05 ENCOUNTER — Encounter: Payer: Self-pay | Admitting: Family Medicine

## 2017-05-05 DIAGNOSIS — I10 Essential (primary) hypertension: Secondary | ICD-10-CM

## 2017-05-05 MED ORDER — LABETALOL HCL 100 MG PO TABS: 100.0000 mg | ORAL_TABLET | Freq: Two times a day (BID) | ORAL | 1 refills | Status: DC

## 2017-05-12 ENCOUNTER — Ambulatory Visit (INDEPENDENT_AMBULATORY_CARE_PROVIDER_SITE_OTHER): Payer: BLUE CROSS/BLUE SHIELD | Admitting: *Deleted

## 2017-05-12 DIAGNOSIS — Z23 Encounter for immunization: Secondary | ICD-10-CM | POA: Diagnosis not present

## 2017-05-12 NOTE — Progress Notes (Signed)
Pre visit review using our clinic review tool, if applicable. No additional management support is needed unless otherwise documented below in the visit note.  Pt here for 2nd shingrix vaccine per standing order of Dr [name].  Shingrix given, 0.75mL IM left deltoid and pt tolerated procedure well.

## 2017-06-03 DIAGNOSIS — F322 Major depressive disorder, single episode, severe without psychotic features: Secondary | ICD-10-CM | POA: Diagnosis not present

## 2017-06-08 ENCOUNTER — Encounter: Payer: Self-pay | Admitting: Family Medicine

## 2017-06-08 ENCOUNTER — Ambulatory Visit (INDEPENDENT_AMBULATORY_CARE_PROVIDER_SITE_OTHER): Payer: BLUE CROSS/BLUE SHIELD | Admitting: Family Medicine

## 2017-06-08 VITALS — BP 132/76 | HR 63 | Temp 97.7°F | Resp 16 | Ht 67.0 in | Wt 237.0 lb

## 2017-06-08 DIAGNOSIS — E785 Hyperlipidemia, unspecified: Secondary | ICD-10-CM

## 2017-06-08 DIAGNOSIS — I10 Essential (primary) hypertension: Secondary | ICD-10-CM | POA: Diagnosis not present

## 2017-06-08 DIAGNOSIS — Z0000 Encounter for general adult medical examination without abnormal findings: Secondary | ICD-10-CM

## 2017-06-08 LAB — COMPREHENSIVE METABOLIC PANEL
ALBUMIN: 3.8 g/dL (ref 3.5–5.2)
ALK PHOS: 96 U/L (ref 39–117)
ALT: 16 U/L (ref 0–35)
AST: 18 U/L (ref 0–37)
BILIRUBIN TOTAL: 0.6 mg/dL (ref 0.2–1.2)
BUN: 10 mg/dL (ref 6–23)
CHLORIDE: 107 meq/L (ref 96–112)
CO2: 27 mEq/L (ref 19–32)
CREATININE: 1.23 mg/dL — AB (ref 0.40–1.20)
Calcium: 9.4 mg/dL (ref 8.4–10.5)
GFR: 58.95 mL/min — ABNORMAL LOW (ref >60.00)
Glucose, Bld: 87 mg/dL (ref 70–99)
Potassium: 3.7 mEq/L (ref 3.5–5.1)
SODIUM: 140 meq/L (ref 135–145)
TOTAL PROTEIN: 7.1 g/dL (ref 6.0–8.3)

## 2017-06-08 LAB — CBC WITH DIFFERENTIAL/PLATELET
BASOS ABS: 0 10*3/uL (ref 0.0–0.1)
Basophils Relative: 0.5 % (ref 0.0–3.0)
EOS ABS: 0.1 10*3/uL (ref 0.0–0.7)
Eosinophils Relative: 1.6 % (ref 0.0–5.0)
HCT: 38.8 % (ref 36.0–46.0)
HEMOGLOBIN: 12.7 g/dL (ref 12.0–15.0)
LYMPHS PCT: 24.4 % (ref 12.0–46.0)
Lymphs Abs: 2.1 10*3/uL (ref 0.7–4.0)
MCHC: 32.8 g/dL (ref 30.0–36.0)
MCV: 86.2 fl (ref 78.0–100.0)
MONO ABS: 0.6 10*3/uL (ref 0.1–1.0)
Monocytes Relative: 6.9 % (ref 3.0–12.0)
Neutro Abs: 5.8 10*3/uL (ref 1.4–7.7)
Neutrophils Relative %: 66.6 % (ref 43.0–77.0)
Platelets: 248 10*3/uL (ref 150.0–400.0)
RBC: 4.51 Mil/uL (ref 3.87–5.11)
RDW: 14.8 % (ref 11.5–15.5)
WBC: 8.8 10*3/uL (ref 4.0–10.5)

## 2017-06-08 LAB — LIPID PANEL
CHOLESTEROL: 130 mg/dL (ref 0–200)
HDL: 39.7 mg/dL (ref >39.00)
LDL CALC: 78 mg/dL (ref 0–99)
NonHDL: 90.19
Total CHOL/HDL Ratio: 3
Triglycerides: 63 mg/dL (ref 0.0–149.0)
VLDL: 12.6 mg/dL (ref 0.0–40.0)

## 2017-06-08 LAB — TSH: TSH: 1.94 u[IU]/mL (ref 0.35–4.50)

## 2017-06-08 MED ORDER — LABETALOL HCL 100 MG PO TABS: 100.0000 mg | ORAL_TABLET | Freq: Two times a day (BID) | ORAL | 1 refills | Status: DC

## 2017-06-08 MED ORDER — ATORVASTATIN CALCIUM 20 MG PO TABS: 20.0000 mg | ORAL_TABLET | Freq: Every day | ORAL | 3 refills | Status: DC

## 2017-06-08 MED ORDER — SPIRONOLACTONE 25 MG PO TABS: 25.0000 mg | ORAL_TABLET | Freq: Two times a day (BID) | ORAL | 3 refills | Status: DC

## 2017-06-08 NOTE — Assessment & Plan Note (Signed)
ghm utd Check labs See AVS 

## 2017-06-08 NOTE — Assessment & Plan Note (Signed)
Tolerating statin, encouraged heart healthy diet, avoid trans fats, minimize simple carbs and saturated fats. Increase exercise as tolerated 

## 2017-06-08 NOTE — Progress Notes (Signed)
Subjective:  I acted as a Education administrator for [employer]. Yancey Flemings, [place]   Patient ID: Cheryl Owens, female    DOB: February 22, 1966, 52 y.o.   MRN: [id number]  Chief Complaint  Patient presents with  . Annual Exam    HPI  Patient is in today for annual exam.  No complaints.    Patient Care Team: [name], Alferd Apa, DO as PCP - General (Family Medicine) Princess Bruins, MD as Consulting Physician (Obstetrics and Gynecology)   Past Medical History:  Diagnosis Date  . Anxiety   . Depression   . Fibroids   . High cholesterol   . HTN (hypertension) 2007   seen in ER  with headaches  . Hypertension   . Hypokalemia 2007   due to HCTZ    Past Surgical History:  Procedure Laterality Date  . arm surgery     post trauma @ age 7  . BREAST SURGERY Right 2016   BREAST BX   . CHOLECYSTECTOMY     gall stones  . WISDOM TOOTH EXTRACTION      Family History  Problem Relation Age of Onset  . Cancer Maternal Grandmother        thyroid  . Hypertension Maternal Grandmother   . Hyperlipidemia Mother   . Hypertension Mother   . Breast cancer Maternal Aunt   . Kidney disease Maternal Aunt         X 2; 1 on Dialysis  . Cancer Maternal Aunt        ? primary; also had HTN  . Prostate cancer Paternal Uncle          3 uncles  . Prostate cancer Father   . Hypertension Paternal Grandmother   . Hypertension Maternal Aunt        X 2  . Bipolar disorder Paternal Aunt   . Stroke Neg Hx   . Diabetes Neg Hx   . Heart disease Neg Hx   . Colon cancer Neg Hx     Social History   Socioeconomic History  . Marital status: Single    Spouse name: Not on file  . Number of children: Not on file  . Years of education: Not on file  . Highest education level: Not on file  Occupational History  . Not on file  Social Needs  . Financial resource strain: Not on file  . Food insecurity:    Worry: Not on file    Inability: Not on file  . Transportation needs:    Medical: Not on file   Non-medical: Not on file  Tobacco Use  . Smoking status: Never Smoker  . Smokeless tobacco: Never Used  Substance and Sexual Activity  . Alcohol use: No  . Drug use: No  . Sexual activity: Never    Comment: 1st intercourse- 43, partners- 10  Lifestyle  . Physical activity:    Days per week: Not on file    Minutes per session: Not on file  . Stress: Not on file  Relationships  . Social connections:    Talks on phone: Not on file    Gets together: Not on file    Attends religious service: Not on file    Active member of club or organization: Not on file    Attends meetings of clubs or organizations: Not on file    Relationship status: Not on file  . Intimate partner violence:    Fear of current or ex partner: Not on file  Emotionally abused: Not on file    Physically abused: Not on file    Forced sexual activity: Not on file  Other Topics Concern  . Not on file  Social History Narrative  . Not on file    Outpatient Medications Prior to Visit  Medication Sig Dispense Refill  . buPROPion (WELLBUTRIN XL) 300 MG 24 hr tablet TAKE 1 TABLET DAILY (OFFICE VISIT DUE NOW) 90 tablet 1  . clonazePAM (KLONOPIN) 1 MG tablet Take .5 in the morning and 1 at bedtime 45 tablet 0  . diclofenac sodium (VOLTAREN) 1 % GEL Apply 2 g topically 4 (four) times daily. 3 Tube 1  . escitalopram (LEXAPRO) 10 MG tablet TAKE 1 TABLET DAILY 90 tablet 1  . [name] 0.35 MG tablet Take 1 tablet (0.35 mg total) by mouth daily. 3 Package 4  . Leuprolide Acetate, 3 Month, (LUPRON DEPOT, 60-MONTH, IM) Inject 1 each into the muscle every 3 (three) months.    . Lorcaserin HCl (BELVIQ) 10 MG TABS Take by mouth.    . NONFORMULARY OR COMPOUNDED ITEM Weight watchers ---for weight loss, bmi 41 1 each 0  . NONFORMULARY OR COMPOUNDED ITEM Gym membership with trainer   Dx obesity , bmi 41 1 each 11  . atorvastatin (LIPITOR) 20 MG tablet Take 1 tablet (20 mg total) by mouth daily. 90 tablet 3  . labetalol (NORMODYNE) 100  MG tablet Take 1 tablet (100 mg total) by mouth 2 (two) times daily. 180 tablet 1  . spironolactone (ALDACTONE) 25 MG tablet Take 1 tablet (25 mg total) by mouth 2 (two) times daily. 180 tablet 3   No facility-administered medications prior to visit.     Allergies  Allergen Reactions  . Hydrochlorothiazide     REACTION: hypokalemia  . Benazepril     Nausea& vomiting, abd cramps    Review of Systems  Constitutional: Negative for chills, fever and malaise/fatigue.  HENT: Negative for congestion and hearing loss.   Eyes: Negative for blurred vision and discharge.  Respiratory: Negative for cough, sputum production and shortness of breath.   Cardiovascular: Negative for chest pain, palpitations and leg swelling.  Gastrointestinal: Negative for abdominal pain, blood in stool, constipation, diarrhea, heartburn, nausea and vomiting.  Genitourinary: Negative for dysuria, frequency, hematuria and urgency.  Musculoskeletal: Negative for back pain, falls and myalgias.  Skin: Negative for rash.  Neurological: Negative for dizziness, sensory change, loss of consciousness, weakness and headaches.  Endo/Heme/Allergies: Negative for environmental allergies. Does not bruise/bleed easily.  Psychiatric/Behavioral: Negative for depression and suicidal ideas. The patient is not nervous/anxious and does not have insomnia.        Objective:    Physical Exam  Constitutional: She is oriented to person, place, and time. She appears well-developed and well-nourished. No distress.  HENT:  Head: Normocephalic and atraumatic.  Right Ear: External ear normal.  Left Ear: External ear normal.  Nose: Nose normal.  Mouth/Throat: Oropharynx is clear and moist.  Eyes: Pupils are equal, round, and reactive to light. Conjunctivae and EOM are normal. Right eye exhibits no discharge. Left eye exhibits no discharge.  Neck: Normal range of motion. Neck supple. No JVD present. No thyromegaly present.  Cardiovascular:  Normal rate, regular rhythm, normal heart sounds and intact distal pulses.  No murmur heard. Pulmonary/Chest: Effort normal and breath sounds normal. No respiratory distress. She has no wheezes. She has no rales. She exhibits no tenderness.  Abdominal: Soft. Bowel sounds are normal. She exhibits no distension and no mass.  There is no tenderness. There is no rebound and no guarding.  Musculoskeletal: Normal range of motion. She exhibits no edema or tenderness.  Lymphadenopathy:    She has no cervical adenopathy.  Neurological: She is alert and oriented to person, place, and time. She has normal reflexes. No cranial nerve deficit.  Skin: Skin is warm and dry. No rash noted. She is not diaphoretic. No erythema.  Psychiatric: She has a normal mood and affect. Her behavior is normal. Judgment and thought content normal.  Nursing note and vitals reviewed.   BP 132/76 (BP Location: Left Arm, Patient Position: Sitting, Cuff Size: Large)   Pulse 63   Temp 97.7 F (36.5 C) (Oral)   Resp 16   Ht 5\' 7"  (1.702 m)   Wt 237 lb (107.5 kg)   SpO2 99%   BMI 37.12 kg/m  Wt Readings from Last 3 Encounters:  06/08/17 237 lb (107.5 kg)  04/30/17 229 lb (103.9 kg)  03/26/17 235 lb (106.6 kg)   BP Readings from Last 3 Encounters:  06/08/17 132/76  04/30/17 121/77  03/26/17 132/84     Immunization History  Administered Date(s) Administered  . Influenza Whole 12/09/2011, 12/09/2012  . Influenza, Seasonal, Injecte, Preservative Fre 01/03/2015  . Influenza-Unspecified 01/12/2017  . Td 09/28/2008  . Zoster Recombinat (Shingrix) 03/13/2017, 05/12/2017    Health Maintenance  Topic Date Due  . HIV Screening  03/14/2027 (Originally 05/11/1980)  . INFLUENZA VACCINE  10/08/2017  . MAMMOGRAM  03/30/2018  . TETANUS/TDAP  09/29/2018  . PAP SMEAR  03/26/2020  . COLONOSCOPY  03/21/2026    Lab Results  Component Value Date   WBC 8.2 02/19/2017   HGB 12.3 02/19/2017   HCT 37.9 02/19/2017   PLT 287  02/19/2017   GLUCOSE 88 04/29/2017   CHOL 130 04/29/2017   TRIG 62.0 04/29/2017   HDL 40.20 04/29/2017   LDLCALC 77 04/29/2017   ALT 15 04/29/2017   AST 14 04/29/2017   NA 139 04/29/2017   K 3.8 04/29/2017   CL 106 04/29/2017   CREATININE 1.18 04/29/2017   BUN 12 04/29/2017   CO2 27 04/29/2017   TSH 1.70 02/19/2017   HGBA1C 5.4 04/19/2015   MICROALBUR 3.1 (H) 05/05/2007    Lab Results  Component Value Date   TSH 1.70 02/19/2017   Lab Results  Component Value Date   WBC 8.2 02/19/2017   HGB 12.3 02/19/2017   HCT 37.9 02/19/2017   MCV 84.0 02/19/2017   PLT 287 02/19/2017   Lab Results  Component Value Date   NA 139 04/29/2017   K 3.8 04/29/2017   CO2 27 04/29/2017   GLUCOSE 88 04/29/2017   BUN 12 04/29/2017   CREATININE 1.18 04/29/2017   BILITOT 0.4 04/29/2017   ALKPHOS 97 04/29/2017   AST 14 04/29/2017   ALT 15 04/29/2017   PROT 7.0 04/29/2017   ALBUMIN 3.8 04/29/2017   CALCIUM 9.5 04/29/2017   ANIONGAP 13 09/26/2013   GFR 61.87 04/29/2017   Lab Results  Component Value Date   CHOL 130 04/29/2017   Lab Results  Component Value Date   HDL 40.20 04/29/2017   Lab Results  Component Value Date   LDLCALC 77 04/29/2017   Lab Results  Component Value Date   TRIG 62.0 04/29/2017   Lab Results  Component Value Date   CHOLHDL 3 04/29/2017   Lab Results  Component Value Date   HGBA1C 5.4 04/19/2015         Assessment &  Plan:   Problem List Items Addressed This Visit      Unprioritized   Essential hypertension    Well controlled, no changes to meds. Encouraged heart healthy diet such as the DASH diet and exercise as tolerated.       Relevant Medications   labetalol (NORMODYNE) 100 MG tablet   atorvastatin (LIPITOR) 20 MG tablet   spironolactone (ALDACTONE) 25 MG tablet   Other Relevant Orders   CBC with Differential/Platelet   Comprehensive metabolic panel   Lipid panel   TSH   Hyperlipidemia LDL goal <100    Tolerating statin,  encouraged heart healthy diet, avoid trans fats, minimize simple carbs and saturated fats. Increase exercise as tolerated      Relevant Medications   labetalol (NORMODYNE) 100 MG tablet   atorvastatin (LIPITOR) 20 MG tablet   spironolactone (ALDACTONE) 25 MG tablet   Other Relevant Orders   CBC with Differential/Platelet   Comprehensive metabolic panel   Lipid panel   TSH   Preventative health care - Primary    .ghm utd Check labs See AVS      Relevant Orders   CBC with Differential/Platelet   Comprehensive metabolic panel   Lipid panel   TSH      I am having Cheryl M. Loftin "[name]" maintain her clonazePAM, escitalopram, buPROPion, (Leuprolide Acetate, 3 Month, (LUPRON DEPOT, 6-MONTH, IM)), NONFORMULARY OR COMPOUNDED ITEM, NONFORMULARY OR COMPOUNDED ITEM, Lorcaserin HCl, [name], diclofenac sodium, labetalol, atorvastatin, and spironolactone.  Meds ordered this encounter  Medications  . labetalol (NORMODYNE) 100 MG tablet    Sig: Take 1 tablet (100 mg total) by mouth 2 (two) times daily.    Dispense:  180 tablet    Refill:  1  . atorvastatin (LIPITOR) 20 MG tablet    Sig: Take 1 tablet (20 mg total) by mouth daily.    Dispense:  90 tablet    Refill:  3  . spironolactone (ALDACTONE) 25 MG tablet    Sig: Take 1 tablet (25 mg total) by mouth 2 (two) times daily.    Dispense:  180 tablet    Refill:  3    CMA served as scribe during this visit. History, Physical and Plan performed by medical provider. Documentation and orders reviewed and attested to.  Ann Held, DO

## 2017-06-08 NOTE — Assessment & Plan Note (Signed)
Well controlled, no changes to meds. Encouraged heart healthy diet such as the DASH diet and exercise as tolerated.  °

## 2017-06-08 NOTE — Patient Instructions (Signed)
Preventive Care 40-64 Years, Female Preventive care refers to lifestyle choices and visits with your health care provider that can promote health and wellness. What does preventive care include?  A yearly physical exam. This is also called an annual well check.  Dental exams once or twice a year.  Routine eye exams. Ask your health care provider how often you should have your eyes checked.  Personal lifestyle choices, including: ? Daily care of your teeth and gums. ? Regular physical activity. ? Eating a healthy diet. ? Avoiding tobacco and drug use. ? Limiting alcohol use. ? Practicing safe sex. ? Taking low-dose aspirin daily starting at age 58. ? Taking vitamin and mineral supplements as recommended by your health care provider. What happens during an annual well check? The services and screenings done by your health care provider during your annual well check will depend on your age, overall health, lifestyle risk factors, and family history of disease. Counseling Your health care provider may ask you questions about your:  Alcohol use.  Tobacco use.  Drug use.  Emotional well-being.  Home and relationship well-being.  Sexual activity.  Eating habits.  Work and work Statistician.  Method of birth control.  Menstrual cycle.  Pregnancy history.  Screening You may have the following tests or measurements:  Height, weight, and BMI.  Blood pressure.  Lipid and cholesterol levels. These may be checked every 5 years, or more frequently if you are over 81 years old.  Skin check.  Lung cancer screening. You may have this screening every year starting at age 78 if you have a 30-pack-year history of smoking and currently smoke or have quit within the past 15 years.  Fecal occult blood test (FOBT) of the stool. You may have this test every year starting at age 65.  Flexible sigmoidoscopy or colonoscopy. You may have a sigmoidoscopy every 5 years or a colonoscopy  every 10 years starting at age 30.  Hepatitis C blood test.  Hepatitis B blood test.  Sexually transmitted disease (STD) testing.  Diabetes screening. This is done by checking your blood sugar (glucose) after you have not eaten for a while (fasting). You may have this done every 1-3 years.  Mammogram. This may be done every 1-2 years. Talk to your health care provider about when you should start having regular mammograms. This may depend on whether you have a family history of breast cancer.  BRCA-related cancer screening. This may be done if you have a family history of breast, ovarian, tubal, or peritoneal cancers.  Pelvic exam and Pap test. This may be done every 3 years starting at age 80. Starting at age 36, this may be done every 5 years if you have a Pap test in combination with an HPV test.  Bone density scan. This is done to screen for osteoporosis. You may have this scan if you are at high risk for osteoporosis.  Discuss your test results, treatment options, and if necessary, the need for more tests with your health care provider. Vaccines Your health care provider may recommend certain vaccines, such as:  Influenza vaccine. This is recommended every year.  Tetanus, diphtheria, and acellular pertussis (Tdap, Td) vaccine. You may need a Td booster every 10 years.  Varicella vaccine. You may need this if you have not been vaccinated.  Zoster vaccine. You may need this after age 5.  Measles, mumps, and rubella (MMR) vaccine. You may need at least one dose of MMR if you were born in  1957 or later. You may also need a second dose.  Pneumococcal 13-valent conjugate (PCV13) vaccine. You may need this if you have certain conditions and were not previously vaccinated.  Pneumococcal polysaccharide (PPSV23) vaccine. You may need one or two doses if you smoke cigarettes or if you have certain conditions.  Meningococcal vaccine. You may need this if you have certain  conditions.  Hepatitis A vaccine. You may need this if you have certain conditions or if you travel or work in places where you may be exposed to hepatitis A.  Hepatitis B vaccine. You may need this if you have certain conditions or if you travel or work in places where you may be exposed to hepatitis B.  Haemophilus influenzae type b (Hib) vaccine. You may need this if you have certain conditions.  Talk to your health care provider about which screenings and vaccines you need and how often you need them. This information is not intended to replace advice given to you by your health care provider. Make sure you discuss any questions you have with your health care provider. Document Released: 03/23/2015 Document Revised: 11/14/2015 Document Reviewed: 12/26/2014 Elsevier Interactive Patient Education  2018 [employer]  

## 2017-06-19 ENCOUNTER — Ambulatory Visit: Payer: BLUE CROSS/BLUE SHIELD | Admitting: Obstetrics & Gynecology

## 2017-07-09 ENCOUNTER — Encounter (INDEPENDENT_AMBULATORY_CARE_PROVIDER_SITE_OTHER): Payer: BLUE CROSS/BLUE SHIELD

## 2017-07-16 ENCOUNTER — Encounter (INDEPENDENT_AMBULATORY_CARE_PROVIDER_SITE_OTHER): Payer: Self-pay

## 2017-07-16 ENCOUNTER — Ambulatory Visit (INDEPENDENT_AMBULATORY_CARE_PROVIDER_SITE_OTHER): Payer: BLUE CROSS/BLUE SHIELD | Admitting: Family Medicine

## 2017-07-21 DIAGNOSIS — E669 Obesity, unspecified: Secondary | ICD-10-CM | POA: Diagnosis not present

## 2017-07-21 DIAGNOSIS — Z6837 Body mass index (BMI) 37.0-37.9, adult: Secondary | ICD-10-CM | POA: Diagnosis not present

## 2017-09-03 ENCOUNTER — Other Ambulatory Visit: Payer: Self-pay | Admitting: Family Medicine

## 2017-10-08 DIAGNOSIS — F322 Major depressive disorder, single episode, severe without psychotic features: Secondary | ICD-10-CM | POA: Diagnosis not present

## 2017-10-13 ENCOUNTER — Telehealth: Payer: Self-pay | Admitting: *Deleted

## 2017-10-13 NOTE — Telephone Encounter (Signed)
Late entry from 10/12/17) patient called left message in triage voicemail c/o bleeding starting again, history of fibroids, had depo provera shot in feb. I called and left message for patient to call.

## 2017-10-13 NOTE — Telephone Encounter (Signed)
Dr.Lavoie patient called back stating she started bleeding again over the weekend on saturday has been daily since then, wearing pads and tampons,changing every 2-3 hours.  She is still taking birth control pills, she asked if another round of depo Lupron would be another option to help with bleeding? Please advise

## 2017-10-14 NOTE — Telephone Encounter (Signed)
Patient informed. Will have appointment desk schedule.

## 2017-10-14 NOTE — Telephone Encounter (Signed)
Please schedule visit with me to decide on DepoLupron again vs surgery.  Will do a CBC at that visit.

## 2017-11-02 ENCOUNTER — Encounter: Payer: Self-pay | Admitting: Obstetrics & Gynecology

## 2017-11-02 ENCOUNTER — Ambulatory Visit: Payer: BLUE CROSS/BLUE SHIELD | Admitting: Obstetrics & Gynecology

## 2017-11-02 VITALS — BP 124/80 | Ht 66.5 in | Wt 235.0 lb

## 2017-11-02 DIAGNOSIS — D219 Benign neoplasm of connective and other soft tissue, unspecified: Secondary | ICD-10-CM | POA: Diagnosis not present

## 2017-11-02 DIAGNOSIS — N921 Excessive and frequent menstruation with irregular cycle: Secondary | ICD-10-CM

## 2017-11-02 LAB — CBC
HCT: 39.2 % (ref 35.0–45.0)
Hemoglobin: 12.5 g/dL (ref 11.7–15.5)
MCH: 27.5 pg (ref 27.0–33.0)
MCHC: 31.9 g/dL — AB (ref 32.0–36.0)
MCV: 86.3 fL (ref 80.0–100.0)
MPV: 10.6 fL (ref 7.5–12.5)
PLATELETS: 293 10*3/uL (ref 140–400)
RBC: 4.54 10*6/uL (ref 3.80–5.10)
RDW: 13.4 % (ref 11.0–15.0)
WBC: 7.5 10*3/uL (ref 3.8–10.8)

## 2017-11-02 NOTE — Progress Notes (Signed)
    ANGELICE PIECH 1965-03-28 [id number]        52 y.o.  [id number] Single  RP: Recurrence of Menometrorrhagia  HPI: On the Progestin-only pill with light menses until 10/2017.  Had a heavy period with 7 days of heavy flow with blood clots and overflowing the pads.  Still bleeding every day, but lighter flow now.  Pelvic cramps with heavy flow, no pelvic pain currently.  Large Uterine fibroids with an overall uterine size at 16.1 x 14.6 x 9.1 cm in 04/2017.  DepoLupron received x 6 months, last 3 month dose in 10/2016.  DepoLupron was well tolerated.   OB History  Gravida Para Term Preterm AB Living  4 1     3 1   SAB TAB Ectopic Multiple Live Births               # Outcome Date GA Lbr Len/2nd Weight Sex Delivery Anes PTL Lv  4 AB           3 AB           2 AB           1 Para             Past medical history,surgical history, problem list, medications, allergies, family history and social history were all reviewed and documented in the EPIC chart.   Directed ROS with pertinent positives and negatives documented in the history of present illness/assessment and plan.  Exam:  Vitals:   11/02/17 0814  BP: 124/80  Weight: 235 lb (106.6 kg)  Height: 5' 6.5" (1.689 m)   General appearance:  Normal  Gynecologic exam deferred  Pelvic US 04/22/2017: T/V and T/A images.  Anteverted uterus measuring 16.11today: By 14.57 x 9.12 cm.  Intramural and subserous fibroids sampling 3.2 x 2.8 cm, 7.1 x 5.8 x 7.0 cm, 6.4 x 6.0 cm, 7.6 x 8.6 cm, on the left 3.3 x 3.7 cm, 4.4 x 4.9 cm.  Endometrial line not well seen.  Right and left ovary not well seen.  Suboptimal images.  Left adnexal tubular shaped fluid, probably left hydrosalpinx versus free fluid less likely measuring 9.8 x 3.2 x 5.1 cm.     Assessment/Plan:  52 y.o. G4P0031   1. Menometrorrhagia Recurrence of menometrorrhagia with large uterine fibroids which were stable after Depo-Lupron x6 months.  Last Depo-Lupron injection was a year ago in  August 2018.  Patient tolerated it well.  Has not controlled her menstrual periods well on birth control pills and progesterone IUD in the past.  Declines Depo-Provera because of the risk of increased weight.  Given that she is 52 years old and would like to avoid surgery, decision to give an another course of Depo-Lupron for 6 months.  Usage, risks and benefits known to patient and reviewed again today.  Will rule out anemia with a CBC.  Will repeat the pelvic ultrasound at the end of the Depo-Lupron treatment. - CBC - US Transvaginal Non-OB; Future  2. Fibroids As above. - US Transvaginal Non-OB; Future  Counseling on above issues and coordination of care more than 50% for 25 minutes.  Princess Bruins MD, 8:21 AM 11/02/2017

## 2017-11-03 ENCOUNTER — Encounter: Payer: Self-pay | Admitting: Obstetrics & Gynecology

## 2017-11-03 NOTE — Patient Instructions (Signed)
1. Menometrorrhagia Recurrence of menometrorrhagia with large uterine fibroids which were stable after Depo-Lupron x6 months.  Last Depo-Lupron injection was a year ago in August 2018.  Patient tolerated it well.  Has not controlled her menstrual periods well on birth control pills and progesterone IUD in the past.  Declines Depo-Provera because of the risk of increased weight.  Given that she is 52 years old and would like to avoid surgery, decision to give an another course of Depo-Lupron for 6 months.  Usage, risks and benefits known to patient and reviewed again today.  Will rule out anemia with a CBC.  Will repeat the pelvic ultrasound at the end of the Depo-Lupron treatment. - CBC - US Transvaginal Non-OB; Future  2. Fibroids As above. - US Transvaginal Non-OB; Future  Cheryl Owens, it was a pleasure seeing you today!  I will inform you of your results as soon as they are available.

## 2017-11-06 DIAGNOSIS — Z6837 Body mass index (BMI) 37.0-37.9, adult: Secondary | ICD-10-CM | POA: Diagnosis not present

## 2017-11-06 DIAGNOSIS — E669 Obesity, unspecified: Secondary | ICD-10-CM | POA: Diagnosis not present

## 2017-11-10 DIAGNOSIS — F322 Major depressive disorder, single episode, severe without psychotic features: Secondary | ICD-10-CM | POA: Insufficient documentation

## 2017-11-22 ENCOUNTER — Encounter: Payer: Self-pay | Admitting: Family Medicine

## 2017-11-26 ENCOUNTER — Ambulatory Visit (INDEPENDENT_AMBULATORY_CARE_PROVIDER_SITE_OTHER): Payer: BLUE CROSS/BLUE SHIELD | Admitting: Anesthesiology

## 2017-11-26 DIAGNOSIS — N921 Excessive and frequent menstruation with irregular cycle: Secondary | ICD-10-CM | POA: Diagnosis not present

## 2017-11-26 MED ORDER — LEUPROLIDE ACETATE (3 MONTH) 11.25 MG IM KIT
11.2500 mg | PACK | Freq: Once | INTRAMUSCULAR | Status: AC
  Administered 2017-11-26: 11.25 mg via INTRAMUSCULAR

## 2017-12-02 DIAGNOSIS — Z713 Dietary counseling and surveillance: Secondary | ICD-10-CM | POA: Diagnosis not present

## 2017-12-08 ENCOUNTER — Other Ambulatory Visit: Payer: Self-pay | Admitting: Family Medicine

## 2017-12-08 ENCOUNTER — Ambulatory Visit: Payer: BLUE CROSS/BLUE SHIELD | Admitting: Psychiatry

## 2017-12-08 DIAGNOSIS — I10 Essential (primary) hypertension: Secondary | ICD-10-CM

## 2018-01-05 ENCOUNTER — Encounter: Payer: Self-pay | Admitting: Family Medicine

## 2018-01-12 ENCOUNTER — Other Ambulatory Visit: Payer: Self-pay | Admitting: Psychiatry

## 2018-01-12 DIAGNOSIS — F320 Major depressive disorder, single episode, mild: Secondary | ICD-10-CM

## 2018-01-21 ENCOUNTER — Ambulatory Visit: Payer: BLUE CROSS/BLUE SHIELD | Admitting: Psychiatry

## 2018-01-21 ENCOUNTER — Encounter: Payer: Self-pay | Admitting: Psychiatry

## 2018-01-21 DIAGNOSIS — F5105 Insomnia due to other mental disorder: Secondary | ICD-10-CM

## 2018-01-21 DIAGNOSIS — F331 Major depressive disorder, recurrent, moderate: Secondary | ICD-10-CM | POA: Diagnosis not present

## 2018-01-21 DIAGNOSIS — F4001 Agoraphobia with panic disorder: Secondary | ICD-10-CM | POA: Diagnosis not present

## 2018-01-21 DIAGNOSIS — F411 Generalized anxiety disorder: Secondary | ICD-10-CM | POA: Diagnosis not present

## 2018-01-21 MED ORDER — ESCITALOPRAM OXALATE 10 MG PO TABS: 10.0000 mg | ORAL_TABLET | Freq: Every day | ORAL | 1 refills | Status: DC

## 2018-01-21 NOTE — Progress Notes (Signed)
Cheryl Owens [id number] 1965-12-26 52 y.o.  Subjective:   Patient ID:  Cheryl Owens is a 52 y.o. (DOB December 02, 1965) female.  Chief Complaint:  Chief Complaint  Patient presents with  . Follow-up    depression and anxiety  . Sleeping Problem    not as much as I should    HPI Cheryl Owens presents to the office today for follow-up of depression and anxiety and history of insomnia and Bz withdrawal.  Bz withdrawal has resolved. Doesn't get in med soon enough bc of cell phone.  No big projects right now. Not as overworked.  Better at not overworked.  Leaves computer at work.    Patient reports stable mood and denies depressed or irritable moods.  Patient denies any recent difficulty with anxiety.  Patient denies difficulty with sleep initiation or maintenance. 5-6 hours.  Denies appetite disturbance.  Patient reports that energy and motivation have been good.  Patient denies any difficulty with concentration.  Patient denies any suicidal ideation. No panic.  Hx noncompliance bc forgets.  Is using a pill box, and is aware if she forgets it.  Trip to [place] upcoming and has anxiety about that.  Disc fears of claustrophobia with it and asks about meds for it.  Review of Systems:  Review of Systems  Neurological: Negative for tremors and weakness.  Psychiatric/Behavioral: Negative for agitation, behavioral problems, confusion, decreased concentration, dysphoric mood, hallucinations, self-injury, sleep disturbance and suicidal ideas. The patient is not nervous/anxious and is not hyperactive.     Medications: I have reviewed the patient's current medications.  Current Outpatient Medications  Medication Sig Dispense Refill  . atorvastatin (LIPITOR) 20 MG tablet Take 1 tablet (20 mg total) by mouth daily. 90 tablet 3  . buPROPion (WELLBUTRIN XL) 300 MG 24 hr tablet TAKE 1 TABLET EVERY MORNING 90 tablet 0  . clonazePAM (KLONOPIN) 0.5 MG tablet Take 0.5 mg by mouth 2 (two) times daily as  needed for anxiety.    Marland Kitchen escitalopram (LEXAPRO) 10 MG tablet TAKE 1 TABLET DAILY 90 tablet 1  . [name] 0.35 MG tablet Take 1 tablet (0.35 mg total) by mouth daily. 3 Package 4  . labetalol (NORMODYNE) 100 MG tablet Take 1 tablet (100 mg total) by mouth 2 (two) times daily. Needs ov before any more refills 180 tablet 0  . Leuprolide Acetate, 3 Month, (LUPRON DEPOT, 70-MONTH, IM) Inject 1 each into the muscle every 3 (three) months.    Marland Kitchen spironolactone (ALDACTONE) 25 MG tablet Take 1 tablet (25 mg total) by mouth 2 (two) times daily. 180 tablet 3  . diclofenac sodium (VOLTAREN) 1 % GEL Apply 2 g topically 4 (four) times daily. (Patient not taking: Reported on 01/21/2018) 3 Tube 1  . Lorcaserin HCl (BELVIQ) 10 MG TABS Take by mouth.    . NONFORMULARY OR COMPOUNDED [employer] membership with trainer   Dx obesity , bmi 21 1 each 11   No current facility-administered medications for this visit.     Medication Side Effects: None  Allergies:  Allergies  Allergen Reactions  . Hydrochlorothiazide     REACTION: hypokalemia  . Benazepril     Nausea& vomiting, abd cramps    Past Medical History:  Diagnosis Date  . Anxiety   . Depression   . Fibroids   . High cholesterol   . HTN (hypertension) 2007   seen in ER  with headaches  . Hypertension   . Hypokalemia 2007   due to HCTZ  Family History  Problem Relation Age of Onset  . Cancer Maternal Grandmother        thyroid  . Hypertension Maternal Grandmother   . Hyperlipidemia Mother   . Hypertension Mother   . Breast cancer Maternal Aunt   . Kidney disease Maternal Aunt         X 2; 1 on Dialysis  . Cancer Maternal Aunt        ? primary; also had HTN  . Prostate cancer Paternal Uncle          3 uncles  . Prostate cancer Father   . Hypertension Paternal Grandmother   . Hypertension Maternal Aunt        X 2  . Bipolar disorder Paternal Aunt   . Stroke Neg Hx   . Diabetes Neg Hx   . Heart disease Neg Hx   . Colon cancer Neg Hx      Social History   Socioeconomic History  . Marital status: Single    Spouse name: Not on file  . Number of children: Not on file  . Years of education: Not on file  . Highest education level: Not on file  Occupational History  . Not on file  Social Needs  . Financial resource strain: Not on file  . Food insecurity:    Worry: Not on file    Inability: Not on file  . Transportation needs:    Medical: Not on file    Non-medical: Not on file  Tobacco Use  . Smoking status: Never Smoker  . Smokeless tobacco: Never Used  Substance and Sexual Activity  . Alcohol use: No  . Drug use: No  . Sexual activity: Never    Comment: 1st intercourse- 29, partners- 10  Lifestyle  . Physical activity:    Days per week: Not on file    Minutes per session: Not on file  . Stress: Not on file  Relationships  . Social connections:    Talks on phone: Not on file    Gets together: Not on file    Attends religious service: Not on file    Active member of club or organization: Not on file    Attends meetings of clubs or organizations: Not on file    Relationship status: Not on file  . Intimate partner violence:    Fear of current or ex partner: Not on file    Emotionally abused: Not on file    Physically abused: Not on file    Forced sexual activity: Not on file  Other Topics Concern  . Not on file  Social History Narrative  . Not on file    Past Medical History, Surgical history, Social history, and Family history were reviewed and updated as appropriate.   Please see review of systems for further details on the patient's review from today.   Objective:   Physical Exam:  There were no vitals taken for this visit.  Physical Exam  Constitutional: She is oriented to person, place, and time. She appears well-developed. No distress.  Musculoskeletal: She exhibits no deformity.  Neurological: She is alert and oriented to person, place, and time. She displays no tremor. Coordination  and gait normal.  Psychiatric: She has a normal mood and affect. Her speech is normal and behavior is normal. Judgment and thought content normal. Her mood appears not anxious. Her affect is not angry, not blunt, not labile and not inappropriate. Cognition and memory are normal. She does not exhibit  a depressed mood. She expresses no homicidal and no suicidal ideation. She expresses no suicidal plans and no homicidal plans.  Insight intact. No auditory or visual hallucinations. No delusions.     Lab Review:     Component Value Date/Time   NA 140 06/08/2017 1343   K 3.7 06/08/2017 1343   CL 107 06/08/2017 1343   CO2 27 06/08/2017 1343   GLUCOSE 87 06/08/2017 1343   BUN 10 06/08/2017 1343   CREATININE 1.23 (H) 06/08/2017 1343   CREATININE 1.28 (H) 02/19/2017 0851   CALCIUM 9.4 06/08/2017 1343   PROT 7.1 06/08/2017 1343   ALBUMIN 3.8 06/08/2017 1343   AST 18 06/08/2017 1343   ALT 16 06/08/2017 1343   ALKPHOS 96 06/08/2017 1343   BILITOT 0.6 06/08/2017 1343   GFRNONAA 60 (L) 09/26/2013 1633   GFRAA 70 (L) 09/26/2013 1633       Component Value Date/Time   WBC 7.5 11/02/2017 0839   RBC 4.54 11/02/2017 0839   HGB 12.5 11/02/2017 0839   HCT 39.2 11/02/2017 0839   PLT 293 11/02/2017 0839   MCV 86.3 11/02/2017 0839   MCH 27.5 11/02/2017 0839   MCHC 31.9 (L) 11/02/2017 0839   RDW 13.4 11/02/2017 0839   LYMPHSABS 2.1 06/08/2017 1343   MONOABS 0.6 06/08/2017 1343   EOSABS 0.1 06/08/2017 1343   BASOSABS 0.0 06/08/2017 1343    No results found for: POCLITH, LITHIUM   No results found for: PHENYTOIN, PHENOBARB, VALPROATE, CBMZ   .res Assessment: Plan:    Major depressive disorder, recurrent episode, moderate (HCC)  Panic disorder with agoraphobia  Generalized anxiety disorder  Insomnia due to mental condition   Greater than 50% of face to face time with patient was spent on counseling and coordination of care. We discussed Still sleep deprived but no longer DT work  Schedule.  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  Mood is therefore better. Discussed meds and SE.  OK bz for flying phobia.  Will be with family.  Disc how to use this with clonazepam vs. Xanax. Disc this in detail and the pros/cons of the options.  She'll just use extra clonazepam bc it's easier. We discussed the short-term risks associated with benzodiazepines including sedation and increased fall risk among others.  Discussed long-term side effect risk including dependence, potential withdrawal symptoms, and the potential eventual dose-related risk of dementia.   Disc SSRI withdrawal.  Take Lexapro as long as remembers before going to sleep.  This appt was 30 mins.  FU 6 mos.  Lynder Parents, MD, DFAPA   Please see After Visit Summary for patient specific instructions.  Future Appointments  Date Time Provider [place]  04/06/2018  5:45 PM [name], Kendrick Fries R, DO [id number]    No orders of the defined types were placed in this encounter.     -------------------------------

## 2018-01-22 ENCOUNTER — Telehealth: Payer: Self-pay | Admitting: Family Medicine

## 2018-01-22 NOTE — Telephone Encounter (Signed)
Copied from [place] [id number]. Topic: Quick Communication - See Telephone Encounter >> Jan 22, 2018 12:49 PM Genella Rife H wrote: CRM for notification. See Telephone encounter for: 01/22/18.  Left voicemail appt for physical needs to be before 3pm or she can keep appointment but change it to office visit on 04/06/18 per pcp.

## 2018-02-09 ENCOUNTER — Other Ambulatory Visit: Payer: Self-pay | Admitting: Family Medicine

## 2018-03-09 ENCOUNTER — Encounter: Payer: Self-pay | Admitting: Family Medicine

## 2018-03-12 ENCOUNTER — Ambulatory Visit (INDEPENDENT_AMBULATORY_CARE_PROVIDER_SITE_OTHER): Payer: BLUE CROSS/BLUE SHIELD | Admitting: Anesthesiology

## 2018-03-12 DIAGNOSIS — N921 Excessive and frequent menstruation with irregular cycle: Secondary | ICD-10-CM

## 2018-03-12 MED ORDER — LEUPROLIDE ACETATE (3 MONTH) 11.25 MG IM KIT
11.2500 mg | PACK | Freq: Once | INTRAMUSCULAR | Status: AC
  Administered 2018-03-12: 11.25 mg via INTRAMUSCULAR

## 2018-04-01 DIAGNOSIS — Z7189 Other specified counseling: Secondary | ICD-10-CM | POA: Diagnosis not present

## 2018-04-01 DIAGNOSIS — Z23 Encounter for immunization: Secondary | ICD-10-CM | POA: Diagnosis not present

## 2018-04-06 ENCOUNTER — Encounter: Payer: BLUE CROSS/BLUE SHIELD | Admitting: Family Medicine

## 2018-04-13 ENCOUNTER — Other Ambulatory Visit: Payer: Self-pay | Admitting: Obstetrics & Gynecology

## 2018-04-13 ENCOUNTER — Other Ambulatory Visit: Payer: Self-pay | Admitting: Family Medicine

## 2018-04-13 DIAGNOSIS — I10 Essential (primary) hypertension: Secondary | ICD-10-CM

## 2018-04-16 ENCOUNTER — Other Ambulatory Visit: Payer: Self-pay | Admitting: Obstetrics & Gynecology

## 2018-04-19 ENCOUNTER — Other Ambulatory Visit: Payer: Self-pay | Admitting: Obstetrics & Gynecology

## 2018-04-28 ENCOUNTER — Encounter: Payer: Self-pay | Admitting: Anesthesiology

## 2018-04-28 ENCOUNTER — Telehealth: Payer: Self-pay | Admitting: *Deleted

## 2018-04-28 ENCOUNTER — Other Ambulatory Visit: Payer: Self-pay | Admitting: Psychiatry

## 2018-04-28 DIAGNOSIS — F320 Major depressive disorder, single episode, mild: Secondary | ICD-10-CM

## 2018-04-28 MED ORDER — HEATHER 0.35 MG PO TABS: 1.0000 | ORAL_TABLET | Freq: Every day | ORAL | 0 refills | Status: DC

## 2018-04-28 NOTE — Telephone Encounter (Signed)
Patient has annual exam scheduled on 07/08/18 needs refill on birth control pills Rx sent.

## 2018-04-29 ENCOUNTER — Encounter (INDEPENDENT_AMBULATORY_CARE_PROVIDER_SITE_OTHER): Payer: Self-pay

## 2018-05-03 ENCOUNTER — Encounter (INDEPENDENT_AMBULATORY_CARE_PROVIDER_SITE_OTHER): Payer: BLUE CROSS/BLUE SHIELD

## 2018-05-04 DIAGNOSIS — Z23 Encounter for immunization: Secondary | ICD-10-CM | POA: Diagnosis not present

## 2018-05-05 ENCOUNTER — Ambulatory Visit (INDEPENDENT_AMBULATORY_CARE_PROVIDER_SITE_OTHER): Payer: BLUE CROSS/BLUE SHIELD | Admitting: Family Medicine

## 2018-05-05 ENCOUNTER — Encounter (INDEPENDENT_AMBULATORY_CARE_PROVIDER_SITE_OTHER): Payer: Self-pay | Admitting: Family Medicine

## 2018-05-05 VITALS — BP 118/79 | Temp 98.6°F | Ht 67.0 in | Wt 252.0 lb

## 2018-05-05 DIAGNOSIS — Z0289 Encounter for other administrative examinations: Secondary | ICD-10-CM

## 2018-05-05 DIAGNOSIS — R5383 Other fatigue: Secondary | ICD-10-CM | POA: Diagnosis not present

## 2018-05-05 DIAGNOSIS — E7849 Other hyperlipidemia: Secondary | ICD-10-CM | POA: Diagnosis not present

## 2018-05-05 DIAGNOSIS — R0602 Shortness of breath: Secondary | ICD-10-CM | POA: Diagnosis not present

## 2018-05-05 DIAGNOSIS — Z6839 Body mass index (BMI) 39.0-39.9, adult: Secondary | ICD-10-CM | POA: Diagnosis not present

## 2018-05-05 DIAGNOSIS — I10 Essential (primary) hypertension: Secondary | ICD-10-CM

## 2018-05-05 DIAGNOSIS — F3289 Other specified depressive episodes: Secondary | ICD-10-CM

## 2018-05-05 DIAGNOSIS — Z9189 Other specified personal risk factors, not elsewhere classified: Secondary | ICD-10-CM

## 2018-05-06 LAB — COMPREHENSIVE METABOLIC PANEL
ALBUMIN: 4.1 g/dL (ref 3.8–4.9)
ALK PHOS: 130 IU/L — AB (ref 39–117)
ALT: 44 IU/L — ABNORMAL HIGH (ref 0–32)
AST: 32 IU/L (ref 0–40)
Albumin/Globulin Ratio: 1.4 (ref 1.2–2.2)
BUN / CREAT RATIO: 11 (ref 9–23)
BUN: 12 mg/dL (ref 6–24)
Bilirubin Total: 0.4 mg/dL (ref 0.0–1.2)
CO2: 21 mmol/L (ref 20–29)
Calcium: 9.7 mg/dL (ref 8.7–10.2)
Chloride: 105 mmol/L (ref 96–106)
Creatinine, Ser: 1.12 mg/dL — ABNORMAL HIGH (ref 0.57–1.00)
GFR calc Af Amer: 65 mL/min/{1.73_m2} (ref >59)
GFR calc non Af Amer: 57 mL/min/{1.73_m2} — ABNORMAL LOW (ref >59)
GLOBULIN, TOTAL: 2.9 g/dL (ref 1.5–4.5)
Glucose: 85 mg/dL (ref 65–99)
Potassium: 4.7 mmol/L (ref 3.5–5.2)
SODIUM: 142 mmol/L (ref 134–144)
Total Protein: 7 g/dL (ref 6.0–8.5)

## 2018-05-06 LAB — CBC WITH DIFFERENTIAL
Basophils Absolute: 0 10*3/uL (ref 0.0–0.2)
Basos: 1 %
EOS (ABSOLUTE): 0.1 10*3/uL (ref 0.0–0.4)
Eos: 1 %
HEMOGLOBIN: 12.8 g/dL (ref 11.1–15.9)
Hematocrit: 40.4 % (ref 34.0–46.6)
Immature Grans (Abs): 0 10*3/uL (ref 0.0–0.1)
Immature Granulocytes: 0 %
Lymphocytes Absolute: 2.1 10*3/uL (ref 0.7–3.1)
Lymphs: 27 %
MCH: 27.1 pg (ref 26.6–33.0)
MCHC: 31.7 g/dL (ref 31.5–35.7)
MCV: 86 fL (ref 79–97)
Monocytes Absolute: 0.5 10*3/uL (ref 0.1–0.9)
Monocytes: 6 %
Neutrophils Absolute: 5.3 10*3/uL (ref 1.4–7.0)
Neutrophils: 65 %
RBC: 4.72 x10E6/uL (ref 3.77–5.28)
RDW: 13.6 % (ref 11.7–15.4)
WBC: 8 10*3/uL (ref 3.4–10.8)

## 2018-05-06 LAB — LIPID PANEL WITH LDL/HDL RATIO
Cholesterol, Total: 160 mg/dL (ref 100–199)
HDL: 54 mg/dL (ref >39)
LDL Calculated: 93 mg/dL (ref 0–99)
LDL/HDL RATIO: 1.7 ratio (ref 0.0–3.2)
Triglycerides: 65 mg/dL (ref 0–149)
VLDL Cholesterol Cal: 13 mg/dL (ref 5–40)

## 2018-05-06 LAB — VITAMIN D 25 HYDROXY (VIT D DEFICIENCY, FRACTURES): Vit D, 25-Hydroxy: 15.5 ng/mL — ABNORMAL LOW (ref 30.0–100.0)

## 2018-05-06 LAB — HEMOGLOBIN A1C
Est. average glucose Bld gHb Est-mCnc: 123 mg/dL
HEMOGLOBIN A1C: 5.9 % — AB (ref 4.8–5.6)

## 2018-05-06 LAB — TSH: TSH: 1.25 u[IU]/mL (ref 0.450–4.500)

## 2018-05-06 LAB — VITAMIN B12: Vitamin B-12: 605 pg/mL (ref 232–1245)

## 2018-05-06 LAB — FOLATE: Folate: 7.2 ng/mL (ref >3.0)

## 2018-05-06 LAB — T3: T3, Total: 110 ng/dL (ref 71–180)

## 2018-05-06 LAB — T4, FREE: Free T4: 1.18 ng/dL (ref 0.82–1.77)

## 2018-05-06 LAB — INSULIN, RANDOM: INSULIN: 18.3 u[IU]/mL (ref 2.6–24.9)

## 2018-05-06 NOTE — Progress Notes (Signed)
Office: [phone]  /  Fax: [phone]   Dear [name],   Thank you for referring Cheryl Owens to our clinic. The following note includes my evaluation and treatment recommendations.  HPI:   Chief Complaint: OBESITY    Cheryl Owens has been referred by Cheryl Held, DO for consultation regarding her obesity and obesity related comorbidities.    Cheryl Owens (MR# [id number]) is a 53 y.o. female who presents on 05/05/2018 for obesity evaluation and treatment. Current BMI is Body mass index is 39.47 kg/m.Marland Kitchen Cheryl Owens has been struggling with her weight for many years and has been unsuccessful in either losing weight, maintaining weight loss, or reaching her healthy weight goal.      Cheryl Owens was referred from her primary care physician last year Cheryl Owens) She was previously on [facility] from her GYN office.     Cheryl Owens attended our information session and states she is currently in the action stage of change and ready to dedicate time achieving and maintaining a healthier weight. Cheryl Owens is interested in becoming our patient and working on intensive lifestyle modifications including (but not limited to) diet, exercise and weight loss.    Cheryl Owens states she struggles with family and or coworkers weight loss sabotage her desired weight loss is 72 lbs she started gaining weight at 53 years old her heaviest weight ever was 250 lbs she has significant food cravings issues  she snacks frequently in the evenings she is frequently drinking liquids with calories she frequently makes poor food choices she frequently eats larger portions than normal  she struggles with emotional eating    Fatigue Cheryl Owens feels her energy is lower than it should be. This has worsened with weight gain and has not worsened recently. Cheryl Owens admits to daytime somnolence and  admits to waking up still tired. Patient is at risk for obstructive sleep apnea. Patent has a history of symptoms of daytime fatigue. Patient generally gets 4  hours of sleep per night, and states they generally have nightime awakenings. Snoring is present. Apneic episodes are not present. Epworth Sleepiness Score is 13.  Dyspnea on exertion Cheryl Owens notes increasing shortness of breath with exercising and seems to be worsening over time with weight gain. She notes getting out of breath sooner with activity than she used to. This has not gotten worse recently. EKG-Normal sinus rhythm at 72 BPM. Cheryl Owens denies orthopnea.  Hypertension Cheryl Owens is a 53 y.o. female with hypertension. Cheryl Owens's blood pressure is controlled. She denies chest pain, chest pressure, or headaches. She is working on weight loss to help control her blood pressure with the goal of decreasing her risk of heart attack and stroke.  Hyperlipidemia Cheryl Owens has hyperlipidemia and has been trying to improve her cholesterol levels with intensive lifestyle modification including a low saturated fat diet, exercise and weight loss. She is on statin and denies any chest pain, claudication or myalgias.  At risk for cardiovascular disease Cheryl Owens is at a higher than average risk for cardiovascular disease due to obesity, hypertension, and hyperlipidemia. She currently denies any chest pain.  Depression with emotional eating behaviors Cheryl Owens struggles daily with emotional eating that is mindless. She has significant feelings of guilt after eating. She is using food for comfort to the extent that it is negatively impacting her health. She often snacks when she is not hungry. Cheryl Owens sometimes feels she is out of control and then feels guilty that she made poor food choices. She has been working on  behavior modification techniques to help reduce her emotional eating and has been somewhat successful. She shows no sign of suicidal or homicidal ideations.  Depression screen Cheryl Owens 2/9 05/05/2018 03/04/2016 02/15/2013  Decreased Interest 3 0 0  Down, Depressed, Hopeless 3 0 0  PHQ - 2 Score 6 0 0  Altered sleeping 3 - -    Tired, decreased energy 3 - -  Change in appetite 3 - -  Feeling bad or failure about yourself  3 - -  Trouble concentrating 3 - -  Moving slowly or fidgety/restless 3 - -  Suicidal thoughts 0 - -  PHQ-9 Score 24 - -  Difficult doing work/chores Somewhat difficult - -     Depression Screen Cheryl Owens's Food and Mood (modified PHQ-9) score was  Depression screen PHQ 2/9 05/05/2018  Decreased Interest 3  Down, Depressed, Hopeless 3  PHQ - 2 Score 6  Altered sleeping 3  Tired, decreased energy 3  Change in appetite 3  Feeling bad or failure about yourself  3  Trouble concentrating 3  Moving slowly or fidgety/restless 3  Suicidal thoughts 0  PHQ-9 Score 24  Difficult doing work/chores Somewhat difficult    ASSESSMENT AND PLAN:  Other fatigue - Plan: EKG 12-Lead, Vitamin B12, Comprehensive metabolic panel, Folate, Hemoglobin A1c, Insulin, random, T3, T4, free, TSH, VITAMIN D 25 Hydroxy (Vit-D Deficiency, Fractures), CBC With Differential  Shortness of breath on exertion  Essential hypertension  Other hyperlipidemia - Plan: Lipid Panel With LDL/HDL Ratio  Other depression - with emotional eating  At risk for heart disease  Class 2 severe obesity with serious comorbidity and body mass index (BMI) of 39.0 to 39.9 in adult, unspecified obesity type (HCC)  PLAN:  Fatigue Cheryl Owens was informed that her fatigue may be related to obesity, depression or many other causes. Labs will be ordered, and in the meanwhile [name] has agreed to work on diet, exercise and weight loss to help with fatigue. Proper sleep hygiene was discussed including the need for 7-8 hours of quality sleep each night. A sleep study was not ordered based on symptoms and Epworth score.  Dyspnea on exertion Cheryl Owens's shortness of breath appears to be obesity related and exercise induced. She has agreed to work on weight loss and gradually increase exercise to treat her exercise induced shortness of breath. If [name] follows  our instructions and loses weight without improvement of her shortness of breath, we will plan to refer to pulmonology. We will monitor this condition regularly. Cheryl Owens agrees to this plan.  Hypertension We discussed sodium restriction, working on healthy weight loss, and a regular exercise program as the means to achieve improved blood pressure control. Cheryl Owens agreed with this plan and agreed to follow up as directed. We will continue to monitor her blood pressure as well as her progress with the above lifestyle modifications. She will watch for signs of hypotension as she continues her lifestyle modifications. We will check CMP and FLP today. Ettamae agrees to follow up with our clinic in 2 weeks.  Hyperlipidemia Cheryl Owens was informed of the American Heart Association Guidelines emphasizing intensive lifestyle modifications as the first line treatment for hyperlipidemia. We discussed many lifestyle modifications today in depth, and Gisell will continue to work on decreasing saturated fats such as fatty red meat, butter and many fried foods. She will also increase vegetables and lean protein in her diet and continue to work on exercise and weight loss efforts. We will check FLP today. Cheryl Owens agrees to follow  up with our clinic in 2 weeks.  Cardiovascular risk counseling Cheryl Owens was given extended (15 minutes) coronary artery disease prevention counseling today. She is 53 y.o. female and has risk factors for heart disease including obesity, hypertension, and hyperlipidemia. We discussed intensive lifestyle modifications today with an emphasis on specific weight loss instructions and strategies. Pt was also informed of the importance of increasing exercise and decreasing saturated fats to help prevent heart disease.  Depression with Emotional Eating Behaviors We discussed behavior modification techniques today to help Cheryl Owens deal with her emotional eating and depression. We will refer to Dr. Mallie Mussel, our bariatric  psychologist. Delaynee agrees to follow up with our clinic in 2 weeks.  Depression Screen Cheryl Owens had a strongly positive depression screening. Depression is commonly associated with obesity and often results in emotional eating behaviors. We will monitor this closely and work on CBT to help improve the non-hunger eating patterns. Referral to Psychology may be required if no improvement is seen as she continues in our clinic.  Obesity Natascha is currently in the action stage of change and her goal is to continue with weight loss efforts. I recommend Madelaine begin the structured treatment plan as follows:  She has agreed to follow the Category 3 plan Kinesha has been instructed to eventually work up to a goal of 150 minutes of combined cardio and strengthening exercise per week for weight loss and overall health benefits. We discussed the following Behavioral Modification Strategies today: increasing lean protein intake, increasing vegetables, work on meal planning and easy cooking plans, better snacking choices, and planning for success   She was informed of the importance of frequent follow up visits to maximize her success with intensive lifestyle modifications for her multiple health conditions. She was informed we would discuss her lab results at her next visit unless there is a critical issue that needs to be addressed sooner. Wilbur agreed to keep her next visit at the agreed upon time to discuss these results.  ALLERGIES: Allergies  Allergen Reactions  . Hydrochlorothiazide     REACTION: hypokalemia  . Benazepril     Nausea& vomiting, abd cramps    MEDICATIONS: Current Outpatient Medications on File Prior to Visit  Medication Sig Dispense Refill  . atorvastatin (LIPITOR) 20 MG tablet Take 1 tablet (20 mg total) by mouth daily. 90 tablet 3  . buPROPion (WELLBUTRIN XL) 300 MG 24 hr tablet TAKE 1 TABLET EVERY MORNING 90 tablet 1  . clonazePAM (KLONOPIN) 0.5 MG tablet Take 0.5 mg by mouth 2 (two) times  daily as needed for anxiety.    Marland Kitchen escitalopram (LEXAPRO) 10 MG tablet Take 1 tablet (10 mg total) by mouth daily. 90 tablet 1  . [name] 0.35 MG tablet Take 1 tablet (0.35 mg total) by mouth daily. 3 Package 0  . labetalol (NORMODYNE) 100 MG tablet Take 1 tablet (100 mg total) by mouth 2 (two) times daily. 180 tablet 3  . spironolactone (ALDACTONE) 25 MG tablet Take 1 tablet (25 mg total) by mouth 2 (two) times daily. 180 tablet 3   No current facility-administered medications on file prior to visit.     PAST MEDICAL HISTORY: Past Medical History:  Diagnosis Date  . Anxiety   . Depression   . Dry mouth   . Fibroids   . Gallbladder disease   . High cholesterol   . HTN (hypertension) 2007   seen in ER  with headaches  . Hypertension   . Hypokalemia 2007   due to  HCTZ  . Nervousness   . Obesity   . Stress   . Swelling of lower extremity   . Trouble in sleeping     PAST SURGICAL HISTORY: Past Surgical History:  Procedure Laterality Date  . arm surgery     post trauma @ age 30  . BREAST SURGERY Right 2016   BREAST BX   . CHOLECYSTECTOMY     gall stones  . WISDOM TOOTH EXTRACTION      SOCIAL HISTORY: Social History   Tobacco Use  . Smoking status: Never Smoker  . Smokeless tobacco: Never Used  Substance Use Topics  . Alcohol use: No  . Drug use: No    FAMILY HISTORY: Family History  Problem Relation Age of Onset  . Cancer Maternal Grandmother        thyroid  . Hypertension Maternal Grandmother   . Hyperlipidemia Mother   . Hypertension Mother   . Depression Mother   . Anxiety disorder Mother   . Obesity Mother   . Breast cancer Maternal Aunt   . Kidney disease Maternal Aunt         X 2; 1 on Dialysis  . Cancer Maternal Aunt        ? primary; also had HTN  . Prostate cancer Paternal Uncle          3 uncles  . Prostate cancer Father   . Cancer Father   . Hypertension Paternal Grandmother   . Hypertension Maternal Aunt        X 2  . Bipolar disorder  Paternal Aunt   . Stroke Neg Hx   . Diabetes Neg Hx   . Heart disease Neg Hx   . Colon cancer Neg Hx     ROS: Review of Systems  Constitutional: Positive for malaise/fatigue. Negative for weight loss.       + Trouble sleeping  HENT:       + Dry mouth  Eyes:       + Wear glasses or contacts  Respiratory: Positive for shortness of breath (with exertion).   Cardiovascular: Negative for chest pain, orthopnea and claudication.       Negative chest pressure  Musculoskeletal: Negative for myalgias.  Neurological: Negative for headaches.  Psychiatric/Behavioral: Positive for depression. Negative for suicidal ideas. The patient is nervous/anxious.        + Stress    PHYSICAL EXAM: Blood pressure 118/79, temperature 98.6 F (37 C), temperature source Oral, height 5\' 7"  (1.702 m), weight 252 lb (114.3 kg). Body mass index is 39.47 kg/m. Physical Exam Vitals signs reviewed.  Constitutional:      Appearance: Normal appearance. She is obese.  HENT:     Head: Normocephalic and atraumatic.     Nose: Nose normal.  Eyes:     General: No scleral icterus.    Extraocular Movements: Extraocular movements intact.  Neck:     Musculoskeletal: Normal range of motion and neck supple.     Comments: No thyromegaly present Cardiovascular:     Rate and Rhythm: Normal rate.     Pulses: Normal pulses.     Heart sounds: Normal heart sounds.  Pulmonary:     Effort: Pulmonary effort is normal. No respiratory distress.     Breath sounds: Normal breath sounds.  Abdominal:     Palpations: Abdomen is soft.     Tenderness: There is no abdominal tenderness.     Comments: + Obesity  Musculoskeletal: Normal range of motion.  Right lower leg: No edema.     Left lower leg: No edema.  Skin:    General: Skin is warm and dry.  Neurological:     Mental Status: She is alert and oriented to person, place, and time.     Coordination: Coordination normal.  Psychiatric:        Mood and Affect: Mood normal.         Behavior: Behavior normal.     RECENT LABS AND TESTS: BMET    Component Value Date/Time   NA 140 06/08/2017 1343   K 3.7 06/08/2017 1343   CL 107 06/08/2017 1343   CO2 27 06/08/2017 1343   GLUCOSE 87 06/08/2017 1343   BUN 10 06/08/2017 1343   CREATININE 1.23 (H) 06/08/2017 1343   CREATININE 1.28 (H) 02/19/2017 0851   CALCIUM 9.4 06/08/2017 1343   GFRNONAA 60 (L) 09/26/2013 1633   GFRAA 70 (L) 09/26/2013 1633   Lab Results  Component Value Date   HGBA1C 5.4 04/19/2015   No results found for: INSULIN CBC    Component Value Date/Time   WBC 7.5 11/02/2017 0839   RBC 4.54 11/02/2017 0839   HGB 12.5 11/02/2017 0839   HCT 39.2 11/02/2017 0839   PLT 293 11/02/2017 0839   MCV 86.3 11/02/2017 0839   MCH 27.5 11/02/2017 0839   MCHC 31.9 (L) 11/02/2017 0839   RDW 13.4 11/02/2017 0839   LYMPHSABS 2.1 06/08/2017 1343   MONOABS 0.6 06/08/2017 1343   EOSABS 0.1 06/08/2017 1343   BASOSABS 0.0 06/08/2017 1343   Iron/TIBC/Ferritin/ %Sat No results found for: IRON, TIBC, FERRITIN, IRONPCTSAT Lipid Panel     Component Value Date/Time   CHOL 130 06/08/2017 1343   TRIG 63.0 06/08/2017 1343   HDL 39.70 06/08/2017 1343   CHOLHDL 3 06/08/2017 1343   VLDL 12.6 06/08/2017 1343   LDLCALC 78 06/08/2017 1343   LDLCALC 129 (H) 02/19/2017 0851   Hepatic Function Panel     Component Value Date/Time   PROT 7.1 06/08/2017 1343   ALBUMIN 3.8 06/08/2017 1343   AST 18 06/08/2017 1343   ALT 16 06/08/2017 1343   ALKPHOS 96 06/08/2017 1343   BILITOT 0.6 06/08/2017 1343   BILIDIR 0.1 04/20/2014 0804      Component Value Date/Time   TSH 1.94 06/08/2017 1343   TSH 1.70 02/19/2017 0851   TSH 1.15 03/04/2016 1023    ECG  shows NSR with a rate of 72 BPM INDIRECT CALORIMETER done today shows a VO2 of 271 and a REE of 1887.  Her calculated basal metabolic rate is 1749 thus her basal metabolic rate is worse than expected.       OBESITY BEHAVIORAL INTERVENTION VISIT  Today's  visit was # 1   Starting weight: 252 lbs Starting date: 05/05/2018 Today's weight : 252 lbs  Today's date: 05/05/2018 Total lbs lost to date: 0    05/05/2018  Height 5\' 7"  (1.702 m)  Weight 252 lb (114.3 kg)  BMI (Calculated) 39.46  BLOOD PRESSURE - SYSTOLIC 449  BLOOD PRESSURE - DIASTOLIC 79  Waist Measurement  48 inches   Body Fat % 38.7 %  Total Body Water (lbs) 99 lbs  RMR 1887     ASK: We discussed the diagnosis of obesity with Myna Hidalgo today and Cheryl Owens agreed to give [place] permission to discuss obesity behavioral modification therapy today.  ASSESS: Liboria has the diagnosis of obesity and her BMI today is 39.46 Nabilah is in the action stage  of change   ADVISE: Dona was educated on the multiple health risks of obesity as well as the benefit of weight loss to improve her health. She was advised of the need for long term treatment and the importance of lifestyle modifications to improve her current health and to decrease her risk of future health problems.  AGREE: Multiple dietary modification options and treatment options were discussed and  Shaquela agreed to follow the recommendations documented in the above note.  ARRANGE: Licia was educated on the importance of frequent visits to treat obesity as outlined per CMS and USPSTF guidelines and agreed to schedule her next follow up appointment today.  I, Trixie Dredge, am acting as transcriptionist for Ilene Qua, MD  I have reviewed the above documentation for accuracy and completeness, and I agree with the above. - Ilene Qua, MD

## 2018-05-10 ENCOUNTER — Encounter (INDEPENDENT_AMBULATORY_CARE_PROVIDER_SITE_OTHER): Payer: Self-pay | Admitting: Family Medicine

## 2018-05-14 ENCOUNTER — Encounter (INDEPENDENT_AMBULATORY_CARE_PROVIDER_SITE_OTHER): Payer: Self-pay | Admitting: Family Medicine

## 2018-05-18 NOTE — Progress Notes (Addendum)
Office: [phone]  /  Fax: [phone]    Date: 05/20/2018  Time Seen: 11:02am Duration: 54 minutes Provider: Glennie Isle, PsyD Type of Session: Intake for Individual Therapy  Type of Contact: Face-to-face  Informed Consent:The provider's role was explained to Myna Hidalgo. The provider reviewed and discussed issues of confidentiality, privacy, and limits therein. In addition to verbal informed consent, written informed consent for psychological services was obtained from [facility] prior to the initial intake interview. Written consent included information concerning the practice, financial arrangements, and confidentiality and patients' rights. Since the clinic is not a 24/7 crisis center, mental health emergency resources were shared in the form of a handout, and the provider explained MyChart, e-mail, voicemail, and/or other messaging systems should be utilized only for non-emergency reasons. Jackeline verbally acknowledged understanding of the aforementioned, and agreed to use mental health emergency resources discussed if needed. Moreover, Nicoya agreed information may be shared with other [facility] providers as needed for coordination of care, and written consent was obtained.   Chief Complaint: Sagan was referred by Dr. Ilene Qua due to depression with emotional eating behaviors. Per the note for the visit with Dr. Ilene Qua on 05/05/2018, "Hailyn struggles daily with emotional eating that is mindless. She has significant feelings of guilt after eating. She is using food for comfort to the extent that it is negatively impacting her health. She often snacks when she is not hungry. Alanys sometimes feels she is out of control and then feels guilty that she made poor food choices. She has been working on behavior modification techniques to help reduce her emotional eating and has been somewhat successful. She shows no sign of suicidal or homicidal  ideations."  During today's appointment, Lamya reported, "I requested to meet with you." She indicated, "I'm viewing this particular approach as being a better approach." She discussed engaging in emotional eating and overeating. Yazmina noted the frequency of emotional eating as every day. Lavaeh shared she tends to craves sweets, such as cookies, cakes, and ice cream. Since starting with the clinic, Cornelia stated she has "been fighting" cravings. She discussed obtaining "free desserts" for her birthday yesterday. She ate one dessert yesterday and ate one this morning for breakfast. 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   Janylah was asked to complete a questionnaire assessing various behaviors related to emotional eating. Kyrsten endorsed the following: overeat when you are celebrating, experience food cravings on a regular basis, eat certain foods when you are anxious, stressed, depressed, or your feelings are hurt, use food to help you cope with emotional situations, find food is comforting to you, overeat when you are angry or upset, overeat when you are worried about something, overeat frequently when you are bored or lonely and eat as a reward.  HPI: Per the note for the initial visit with Dr. Ilene Qua on 05/05/2018, Cattleya reported experiencing the following: significant food cravings issues , snacking frequently in the evenings, frequently drinking liquids with calories, frequently making poor food choices, frequently eating larger portions than normal  and struggling with emotional eating.   During today's appointment, Yarely reported, "As a younger person, I never had issues with my weight." She explained she began gaining weight around the age of 29. 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 She explained her relationship with her biological father is  "superficial"  and her step-mother is reportedly "jealous." Regarding emotional eating, she noted the onset was likely in childhood. She believes it was secondary to feeling "unloved" by her father. Xitlalic reported a history of overeating, but denied episodes of binge eating. Arliss denied a history of purging and engagement in other compensatory strategies, and has never been diagnosed with an eating disorder.   Mental Status Examination: Wylie arrived on time for the appointment. She presented as appropriately dressed and groomed. Rudean appeared her stated age and demonstrated adequate orientation to time, place, person, and purpose of the appointment. She also demonstrated appropriate eye contact. No psychomotor abnormalities or behavioral peculiarities noted. Her mood was euthymic with congruent affect. Her thought processes were logical, linear, and goal-directed. No hallucinations, delusions, bizarre thinking or behavior reported or observed. Judgment, insight, and impulse control appeared to be grossly intact. There was no evidence of paraphasias (i.e., errors in speech, gross mispronunciations, and word substitutions), repetition deficits, or disturbances in volume or prosody (i.e., rhythm and intonation). There was no evidence of attention or memory impairments. Madelline denied current suicidal and homicidal ideation, plan, and intent.  Family & Psychosocial History: Keyonia shared she is not in a relationship, and she has never been married. Delbra shared she has one daughter (age 41). 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 She noted her highest level of education is a bachelor's degree. Dayelin shared her social support system consists of her daughter, mother, and co-workers. 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   Medical History:  Past Medical History:  Diagnosis Date  . Anxiety   . Depression   . Dry mouth   . Fibroids   .  Gallbladder disease   . High cholesterol   . HTN (hypertension) 2007   seen in ER  with headaches  . Hypertension   . Hypokalemia 2007   due to HCTZ  . Nervousness   . Obesity   . Stress   . Swelling of lower extremity   . Trouble in sleeping    Past Surgical History:  Procedure Laterality Date  . arm surgery     post trauma @ age 43  . BREAST SURGERY Right 2016   BREAST BX   . CHOLECYSTECTOMY     gall stones  . WISDOM TOOTH EXTRACTION     Current Outpatient Medications on File Prior to Visit  Medication Sig Dispense Refill  . atorvastatin (LIPITOR) 20 MG tablet Take 1 tablet (20 mg total) by mouth daily. 90 tablet 3  . buPROPion (WELLBUTRIN XL) 300 MG 24 hr tablet TAKE 1 TABLET EVERY MORNING 90 tablet 1  . clonazePAM (KLONOPIN) 0.5 MG tablet Take 0.5 mg by mouth 2 (two) times daily as needed for anxiety.    Marland Kitchen escitalopram (LEXAPRO) 10 MG tablet Take 1 tablet (10 mg total) by mouth daily. 90 tablet 1  . [name] 0.35 MG tablet Take 1 tablet (0.35 mg total) by mouth daily. 3 Package 0  . labetalol (NORMODYNE) 100 MG tablet Take 1 tablet (100 mg total) by mouth 2 (two) times daily. 180 tablet 3  . spironolactone (ALDACTONE) 25 MG tablet Take 1 tablet (25 mg total) by mouth 2 (two) times daily. 180 tablet 3   No current facility-administered medications on file prior to visit.   Shantaya denied a history of head injuries and loss of consciousness.   Mental Health History: Lakesa shared she first received therapeutic services around 58  years ago due to "being a young, single mother." She explained she was hospitalized due to an overdose on Tylenol. Lacreshia indicated she informed her mother of the overdose, and she was subsequently hospitalized for "a couple of weeks." She could not recall details. Approximately five years ago, Jackeline stated she began experiencing panic attacks resulting in her seeking individual therapy. She explained she "had issues at work" and was "out for three months." She  received services for approximately one to two years. Rozell noted, "I went from a psychiatrist to a psychologist." She reported she later switched to another psychiatrist as her first psychiatrist retired. Lucita Ferrara shared her psychiatrist Dr. Lynder Parents with [facility]. She meets with Dr. Clovis Pu every 6 months for medication management. She no longer meets with a psychologist, as her psychologist also retired. Diarra noted she is currently prescribed Wellbutrin, Lexapro, and Klonopin by Dr. Clovis Pu. She is currently being tapered off of Klonopin. Her last appointment with Dr. Clovis Pu was in October of 2019. Jodelle noted her next appointment is Jul 22, 2018. She noted she has been meeting with him for approximately two years. Regarding family history of mental health related concerns, Khloey indicated her mother and maternal aunt suffer from anxiety and depression. She added her paternal aunt, was either diagnosed as "schizophrenic or bipolar." Moreover, Akanksha denied a trauma history, including psychological, physical  and sexual abuse, as well as neglect.   Lurleen described her typical mood as "pretty even keel, upbeat and happy." She endorsed experiencing depressed mood at times due to "lack of companionship." She noted difficulty falling asleep resulting in her averaging 4 to 5 hours a night.  Additionally, Analena endorsed experiencing the following: anhedonia due to weight; fatigue; overeating; decreased self-esteem due to weight; worry thoughts regarding her mother's well-being; social withdrawal due to weight; and irritability. She endorsed attention and concentration issues, and explained she often jumps around to different things while at work. She noted the aforementioned started approximately 5 years ago. While she endorsed memory concerns, she could not recall specifics and believes the memory issues may be secondary to her current medications. She reported a history of panic attacks as explained above and  noted the last time she experienced one was approximately 5 years ago.  Elsy denied experiencing the following: hopelessness, feeling fidgety/restless, obsessions and compulsions, hallucinations and delusions, paranoia, mania, angry outbursts, substance use, crying spells and decreased motivation. She also denied current suicidal ideation, plan, and intent; history of and current homicidal ideation, plan, and intent; and history of and current engagement in self-harm.  Regarding suicidal ideation, Natika last experienced suicidal ideation approximately 30 years ago [as discussed above]. She noted it was the first and last time. She denied a history of any other suicide attempts. The following protective factors were identified for [facility]: daughter, mother, and life in general. She added, "I think I have a different appreciation. I want to help people too." If she were to become overwhelmed in the future, which is a sign that a crisis may occur, she identified the following coping skills she could engage in: talk to daughter, talk to friends, and go for a walk. The aforementioned was developed into a coping card, and provided to [place]. Ziya's confidence in utilizing emergency resources should the feeling of being overwhelmed intensify was assessed on a scale of one to ten where one is not confident and ten is extremely confident. She reported her confidence is a 10. She denied having access to firearms and weapons.  The following strengths were reported by [facility]: caring, and sensitive. The following strengths were observed by this provider: ability to express thoughts and feelings during the therapeutic session, ability to establish and benefit from a therapeutic relationship, ability to learn and practice coping skills, willingness to work toward established goal(s) with the clinic and ability to engage in reciprocal conversation.  Legal History: Brigetta denied a history of legal involvement.   Structured  Assessment Results: The Patient Health Questionnaire-9 (PHQ-9) is a self-report measure that assesses symptoms and severity of depression over the course of the last two weeks. Silva obtained a score of 9 suggesting mild depression. Audine finds the endorsed symptoms to be somewhat difficult. Depression screen PHQ 2/9 05/20/2018  Decreased Interest 1  Down, Depressed, Hopeless 1  PHQ - 2 Score 2  Altered sleeping 1  Tired, decreased energy 1  Change in appetite 2  Feeling bad or failure about yourself  2  Trouble concentrating 1  Moving slowly or fidgety/restless 0  Suicidal thoughts 0  PHQ-9 Score 9  Difficult doing work/chores -   The Generalized Anxiety Disorder-7 (GAD-7) is a brief self-report measure that assesses symptoms of anxiety over the course of the last two weeks. Sharian obtained a score of 5 suggesting mild anxiety. GAD 7 : Generalized Anxiety Score 05/20/2018  Nervous, Anxious, on Edge 1  Control/stop worrying 1  Worry too much - different things 1  Trouble relaxing 0  Restless 0  Easily annoyed or irritable 1  Afraid - awful might happen 1  Total GAD 7 Score 5  Anxiety Difficulty Somewhat difficult   Interventions: A chart review was conducted prior to the clinical intake interview. The PHQ-9, and GAD-7 were administered and a clinical intake interview was completed. In addition, Salimatou was asked to complete a Mood and Food questionnaire to assess various behaviors related to emotional eating. Throughout session, empathic reflections and validation was provided. A risk assessment was completed, and a coping card was developed. Continuing treatment with this provider was discussed and a treatment goal was established. Psychoeducation regarding emotional versus physical hunger was provided. Yaresly was given a handout to utilize between now and the next appointment to increase awareness of hunger patterns and subsequent eating.   Provisional DSM-5 Diagnosis: 311 (F32.8) Other Specified  Depressive Disorder, Emotional Eating Behaviors  Plan: Kyrstyn appears able and willing to participate as evidenced by collaboration on a treatment goal, engagement in reciprocal conversation, and asking questions as needed for clarification. The next appointment will be scheduled in two weeks. The following treatment goal was established: decrease emotional eating. For the aforementioned goal, Albertha can benefit from biweekly individual therapy sessions that are brief in duration for approximately four to six sessions. The treatment modality will be individual therapeutic services, including an eclectic therapeutic approach utilizing techniques from Cognitive Behavioral Therapy, Patient Centered Therapy, Dialectical Behavior Therapy, Acceptance and Commitment Therapy, Interpersonal Therapy, and Cognitive Restructuring. Therapeutic approach will include various interventions as appropriate, such as validation, support, mindfulness, thought defusion, reframing, psychoeducation, values assessment, and role playing. This provider will regularly review the treatment plan and medical chart to keep informed of status changes. Selinda expressed understanding and agreement with the initial treatment plan of care.

## 2018-05-19 ENCOUNTER — Ambulatory Visit (INDEPENDENT_AMBULATORY_CARE_PROVIDER_SITE_OTHER): Payer: BLUE CROSS/BLUE SHIELD | Admitting: Family Medicine

## 2018-05-19 ENCOUNTER — Other Ambulatory Visit: Payer: Self-pay

## 2018-05-19 ENCOUNTER — Encounter (INDEPENDENT_AMBULATORY_CARE_PROVIDER_SITE_OTHER): Payer: Self-pay | Admitting: Family Medicine

## 2018-05-19 VITALS — BP 110/76 | HR 77 | Temp 98.1°F | Ht 67.0 in | Wt 245.0 lb

## 2018-05-19 DIAGNOSIS — Z9189 Other specified personal risk factors, not elsewhere classified: Secondary | ICD-10-CM | POA: Diagnosis not present

## 2018-05-19 DIAGNOSIS — R7989 Other specified abnormal findings of blood chemistry: Secondary | ICD-10-CM

## 2018-05-19 DIAGNOSIS — R7303 Prediabetes: Secondary | ICD-10-CM

## 2018-05-19 DIAGNOSIS — R945 Abnormal results of liver function studies: Secondary | ICD-10-CM | POA: Diagnosis not present

## 2018-05-19 DIAGNOSIS — E559 Vitamin D deficiency, unspecified: Secondary | ICD-10-CM | POA: Diagnosis not present

## 2018-05-19 DIAGNOSIS — Z6838 Body mass index (BMI) 38.0-38.9, adult: Secondary | ICD-10-CM

## 2018-05-20 ENCOUNTER — Other Ambulatory Visit: Payer: Self-pay | Admitting: Family Medicine

## 2018-05-20 ENCOUNTER — Ambulatory Visit (INDEPENDENT_AMBULATORY_CARE_PROVIDER_SITE_OTHER): Payer: BLUE CROSS/BLUE SHIELD | Admitting: Psychology

## 2018-05-20 ENCOUNTER — Other Ambulatory Visit: Payer: Self-pay

## 2018-05-20 DIAGNOSIS — E785 Hyperlipidemia, unspecified: Secondary | ICD-10-CM

## 2018-05-20 DIAGNOSIS — F3289 Other specified depressive episodes: Secondary | ICD-10-CM | POA: Diagnosis not present

## 2018-05-20 MED ORDER — VITAMIN D (ERGOCALCIFEROL) 1.25 MG (50000 UNIT) PO CAPS: 50000.0000 [IU] | ORAL_CAPSULE | ORAL | 0 refills | Status: DC

## 2018-05-20 NOTE — Progress Notes (Signed)
Office: [phone]  /  Fax: [phone]   HPI:   Chief Complaint: OBESITY Cheryl Owens is here to discuss her progress with her obesity treatment plan. She is on the Category 3 plan and is following her eating plan approximately 70 % of the time. She states she is exercising 0 minutes 0 times per week. Cheryl Owens did not start meal plan until after initial appointment. She is starting to get tired of fruit options. She hasn't stopped going out to eat but has been more mindful. She is not eating [place]ila or [place] in the morning.  Her weight is 245 lb (111.1 kg) today and has had a weight loss of 7 pounds over a period of 2 weeks since her last visit. She has lost 7 lbs since starting treatment with [place].  Vitamin D Deficiency Cheryl Owens has a diagnosis of vitamin D deficiency. She is not currently taking OTC Vit D replacement. She notes fatigue and denies nausea, vomiting or muscle weakness.  Pre-Diabetes Cheryl Owens has a diagnosis of pre-diabetes based on her elevated Hgb A1c and was informed this puts her at greater risk of developing diabetes. She notes carbohydrate cravings for sweet tea, [place], and [place]. She has had this diagnosis for the last 5 years with fluctuating Hgb A1c. She is not taking metformin currently and continues to work on diet and exercise to decrease risk of diabetes. She denies nausea or hypoglycemia.  At risk for diabetes Cheryl Owens is at higher than average risk for developing diabetes due to her obesity and pre-diabetes. She currently denies polyuria or polydipsia.  Elevated LFTs Cheryl Owens has a diagnosis of elevated ALT, but last AST was within normal limits. Her alkaline phosphatase is slightly elevated and previously her LFTs were within normal limits. She denies abdominal pain or jaundice and has never been told of any liver problems in the past. She denies excessive alcohol intake.  ASSESSMENT AND PLAN:  Vitamin D deficiency - Plan: Vitamin D, Ergocalciferol, (DRISDOL) 1.25  MG (50000 UT) CAPS capsule  Prediabetes  Elevated LFTs  At risk for diabetes mellitus  Class 2 severe obesity with serious comorbidity and body mass index (BMI) of 38.0 to 38.9 in adult, unspecified obesity type ([name])  PLAN:  Vitamin D Deficiency Cheryl Owens was informed that low vitamin D levels contributes to fatigue and are associated with obesity, breast, and colon cancer. Cheryl Owens agrees to start prescription Vit D @50 ,000 IU every week #4 with no refills. She will follow up for routine testing of vitamin D, at least 2-3 times per year. She was informed of the risk of over-replacement of vitamin D and agrees to not increase her dose unless she discusses this with [place] first. Cheryl Owens agrees to follow up with our clinic in 2 weeks.  Pre-Diabetes Cheryl Owens will continue to work on weight loss, exercise, and decreasing simple carbohydrates in her diet to help decrease the risk of diabetes. We dicussed metformin including benefits and risks. She was informed that eating too many simple carbohydrates or too many calories at one sitting increases the likelihood of GI side effects. Cheryl Owens declined metformin for now and a prescription was not written today. We will repeat Hgb A1c and insulin in 3 months. Cheryl Owens agrees to follow up with our clinic in 2 weeks as directed to monitor her progress.  Diabetes risk counseling Cheryl Owens was given extended (30 minutes) diabetes prevention counseling today. She is 53 y.o. female and has risk factors for diabetes including obesity and pre-diabetes. We discussed intensive lifestyle  modifications today with an emphasis on weight loss as well as increasing exercise and decreasing simple carbohydrates in her diet.  Elevated LFTs We discussed the likely diagnosis of non alcoholic fatty liver disease today and how this condition is obesity related. Cheryl Owens was educated on her risk of developing NASH or even liver failure and th only proven treatment for NAFLD was weight loss. Cheryl Owens agreed to  continue with her weight loss efforts with healthier diet and exercise as an essential part of her treatment plan. We will repeat CMP in 3 months. Cheryl Owens agrees to follow up with our clinic in 2 weeks.  Obesity Cheryl Owens is currently in the action stage of change. As such, her goal is to continue with weight loss efforts She has agreed to follow the Category 3 plan Cheryl Owens has been instructed to work up to a goal of 150 minutes of combined cardio and strengthening exercise per week for weight loss and overall health benefits. We discussed the following Behavioral Modification Strategies today: increasing lean protein intake, increasing vegetables, work on meal planning and easy cooking plans, better snacking choices, and planning for success   Cheryl Owens has agreed to follow up with our clinic in 2 weeks. She was informed of the importance of frequent follow up visits to maximize her success with intensive lifestyle modifications for her multiple health conditions.  ALLERGIES: Allergies  Allergen Reactions  . Hydrochlorothiazide     REACTION: hypokalemia  . Benazepril     Nausea& vomiting, abd cramps    MEDICATIONS: Current Outpatient Medications on File Prior to Visit  Medication Sig Dispense Refill  . atorvastatin (LIPITOR) 20 MG tablet Take 1 tablet (20 mg total) by mouth daily. 90 tablet 3  . buPROPion (WELLBUTRIN XL) 300 MG 24 hr tablet TAKE 1 TABLET EVERY MORNING 90 tablet 1  . clonazePAM (KLONOPIN) 0.5 MG tablet Take 0.5 mg by mouth 2 (two) times daily as needed for anxiety.    Marland Kitchen escitalopram (LEXAPRO) 10 MG tablet Take 1 tablet (10 mg total) by mouth daily. 90 tablet 1  . [name] 0.35 MG tablet Take 1 tablet (0.35 mg total) by mouth daily. 3 Package 0  . labetalol (NORMODYNE) 100 MG tablet Take 1 tablet (100 mg total) by mouth 2 (two) times daily. 180 tablet 3  . spironolactone (ALDACTONE) 25 MG tablet Take 1 tablet (25 mg total) by mouth 2 (two) times daily. 180 tablet 3   No current  facility-administered medications on file prior to visit.     PAST MEDICAL HISTORY: Past Medical History:  Diagnosis Date  . Anxiety   . Depression   . Dry mouth   . Fibroids   . Gallbladder disease   . High cholesterol   . HTN (hypertension) 2007   seen in ER  with headaches  . Hypertension   . Hypokalemia 2007   due to HCTZ  . Nervousness   . Obesity   . Stress   . Swelling of lower extremity   . Trouble in sleeping     PAST SURGICAL HISTORY: Past Surgical History:  Procedure Laterality Date  . arm surgery     post trauma @ age 45  . BREAST SURGERY Right 2016   BREAST BX   . CHOLECYSTECTOMY     gall stones  . WISDOM TOOTH EXTRACTION      SOCIAL HISTORY: Social History   Tobacco Use  . Smoking status: Never Smoker  . Smokeless tobacco: Never Used  Substance Use Topics  .  Alcohol use: No  . Drug use: No    FAMILY HISTORY: Family History  Problem Relation Age of Onset  . Cancer Maternal Grandmother        thyroid  . Hypertension Maternal Grandmother   . Hyperlipidemia Mother   . Hypertension Mother   . Depression Mother   . Anxiety disorder Mother   . Obesity Mother   . Breast cancer Maternal Aunt   . Kidney disease Maternal Aunt         X 2; 1 on Dialysis  . Cancer Maternal Aunt        ? primary; also had HTN  . Prostate cancer Paternal Uncle          3 uncles  . Prostate cancer Father   . Cancer Father   . Hypertension Paternal Grandmother   . Hypertension Maternal Aunt        X 2  . Bipolar disorder Paternal Aunt   . Stroke Neg Hx   . Diabetes Neg Hx   . Heart disease Neg Hx   . Colon cancer Neg Hx     ROS: Review of Systems  Constitutional: Positive for malaise/fatigue and weight loss.  Eyes:       Negative jaundice  Gastrointestinal: Negative for abdominal pain, nausea and vomiting.  Genitourinary: Negative for frequency.  Musculoskeletal:       Negative muscle weakness  Endo/Heme/Allergies: Negative for polydipsia.        Negative hypoglycemia    PHYSICAL EXAM: Blood pressure 110/76, pulse 77, temperature 98.1 F (36.7 C), temperature source Oral, height 5\' 7"  (1.702 m), weight 245 lb (111.1 kg), SpO2 97 %. Body mass index is 38.37 kg/m. Physical Exam Vitals signs reviewed.  Constitutional:      Appearance: Normal appearance. She is obese.  Cardiovascular:     Rate and Rhythm: Normal rate.     Pulses: Normal pulses.  Pulmonary:     Effort: Pulmonary effort is normal.     Breath sounds: Normal breath sounds.  Musculoskeletal: Normal range of motion.  Skin:    General: Skin is warm and dry.  Neurological:     Mental Status: She is alert and oriented to person, place, and time.  Psychiatric:        Mood and Affect: Mood normal.        Behavior: Behavior normal.     RECENT LABS AND TESTS: BMET    Component Value Date/Time   NA 142 05/05/2018 1252   K 4.7 05/05/2018 1252   CL 105 05/05/2018 1252   CO2 21 05/05/2018 1252   GLUCOSE 85 05/05/2018 1252   GLUCOSE 87 06/08/2017 1343   BUN 12 05/05/2018 1252   CREATININE 1.12 (H) 05/05/2018 1252   CREATININE 1.28 (H) 02/19/2017 0851   CALCIUM 9.7 05/05/2018 1252   GFRNONAA 57 (L) 05/05/2018 1252   GFRAA 65 05/05/2018 1252   Lab Results  Component Value Date   HGBA1C 5.9 (H) 05/05/2018   HGBA1C 5.4 04/19/2015   HGBA1C 5.7 02/15/2013   HGBA1C 5.4 12/03/2010   HGBA1C 5.3 09/28/2008   Lab Results  Component Value Date   INSULIN 18.3 05/05/2018   CBC    Component Value Date/Time   WBC 8.0 05/05/2018 1252   WBC 7.5 11/02/2017 0839   RBC 4.72 05/05/2018 1252   RBC 4.54 11/02/2017 0839   HGB 12.8 05/05/2018 1252   HCT 40.4 05/05/2018 1252   PLT 293 11/02/2017 0839   MCV 86 05/05/2018 1252  MCH 27.1 05/05/2018 1252   MCH 27.5 11/02/2017 0839   MCHC 31.7 05/05/2018 1252   MCHC 31.9 (L) 11/02/2017 0839   RDW 13.6 05/05/2018 1252   LYMPHSABS 2.1 05/05/2018 1252   MONOABS 0.6 06/08/2017 1343   EOSABS 0.1 05/05/2018 1252   BASOSABS  0.0 05/05/2018 1252   Iron/TIBC/Ferritin/ %Sat No results found for: IRON, TIBC, FERRITIN, IRONPCTSAT Lipid Panel     Component Value Date/Time   CHOL 160 05/05/2018 1252   TRIG 65 05/05/2018 1252   HDL 54 05/05/2018 1252   CHOLHDL 3 06/08/2017 1343   VLDL 12.6 06/08/2017 1343   LDLCALC 93 05/05/2018 1252   LDLCALC 129 (H) 02/19/2017 0851   Hepatic Function Panel     Component Value Date/Time   PROT 7.0 05/05/2018 1252   ALBUMIN 4.1 05/05/2018 1252   AST 32 05/05/2018 1252   ALT 44 (H) 05/05/2018 1252   ALKPHOS 130 (H) 05/05/2018 1252   BILITOT 0.4 05/05/2018 1252   BILIDIR 0.1 04/20/2014 0804      Component Value Date/Time   TSH 1.250 05/05/2018 1252   TSH 1.94 06/08/2017 1343   TSH 1.70 02/19/2017 0851      OBESITY BEHAVIORAL INTERVENTION VISIT  Today's visit was # 2   Starting weight: 252 lbs Starting date: 05/05/2018 Today's weight : 245 lbs  Today's date: 05/19/2018 Total lbs lost to date: 7    05/19/2018  Height 5\' 7"  (1.702 m)  Weight 245 lb (111.1 kg)  BMI (Calculated) 38.36  BLOOD PRESSURE - SYSTOLIC 588  BLOOD PRESSURE - DIASTOLIC 76   Body Fat % 50.2 %  Total Body Water (lbs) 97 lbs     ASK: We discussed the diagnosis of obesity with Myna Hidalgo today and Lucita Ferrara agreed to give [place] permission to discuss obesity behavioral modification therapy today.  ASSESS: Lajune has the diagnosis of obesity and her BMI today is 38.36 Adna is in the action stage of change   ADVISE: Alilah was educated on the multiple health risks of obesity as well as the benefit of weight loss to improve her health. She was advised of the need for long term treatment and the importance of lifestyle modifications to improve her current health and to decrease her risk of future health problems.  AGREE: Multiple dietary modification options and treatment options were discussed and  Reginald agreed to follow the recommendations documented in the above note.  ARRANGE: Joyce was educated  on the importance of frequent visits to treat obesity as outlined per CMS and USPSTF guidelines and agreed to schedule her next follow up appointment today.  I, Cheryl Owens, am acting as transcriptionist for Cheryl Qua, MD  I have reviewed the above documentation for accuracy and completeness, and I agree with the above. - Cheryl Qua, MD

## 2018-05-27 ENCOUNTER — Encounter: Payer: BLUE CROSS/BLUE SHIELD | Admitting: Family Medicine

## 2018-06-02 ENCOUNTER — Encounter (INDEPENDENT_AMBULATORY_CARE_PROVIDER_SITE_OTHER): Payer: Self-pay

## 2018-06-08 ENCOUNTER — Encounter (INDEPENDENT_AMBULATORY_CARE_PROVIDER_SITE_OTHER): Payer: Self-pay | Admitting: Family Medicine

## 2018-06-09 ENCOUNTER — Encounter (INDEPENDENT_AMBULATORY_CARE_PROVIDER_SITE_OTHER): Payer: Self-pay | Admitting: Family Medicine

## 2018-06-09 ENCOUNTER — Ambulatory Visit (INDEPENDENT_AMBULATORY_CARE_PROVIDER_SITE_OTHER): Payer: BLUE CROSS/BLUE SHIELD | Admitting: Psychology

## 2018-06-09 ENCOUNTER — Ambulatory Visit (INDEPENDENT_AMBULATORY_CARE_PROVIDER_SITE_OTHER): Payer: BLUE CROSS/BLUE SHIELD | Admitting: Physician Assistant

## 2018-06-09 ENCOUNTER — Encounter (INDEPENDENT_AMBULATORY_CARE_PROVIDER_SITE_OTHER): Payer: Self-pay

## 2018-06-09 ENCOUNTER — Encounter (INDEPENDENT_AMBULATORY_CARE_PROVIDER_SITE_OTHER): Payer: Self-pay | Admitting: Physician Assistant

## 2018-06-09 ENCOUNTER — Other Ambulatory Visit: Payer: Self-pay

## 2018-06-09 DIAGNOSIS — E559 Vitamin D deficiency, unspecified: Secondary | ICD-10-CM

## 2018-06-09 DIAGNOSIS — Z6838 Body mass index (BMI) 38.0-38.9, adult: Secondary | ICD-10-CM

## 2018-06-09 DIAGNOSIS — F3289 Other specified depressive episodes: Secondary | ICD-10-CM | POA: Diagnosis not present

## 2018-06-09 MED ORDER — VITAMIN D (ERGOCALCIFEROL) 1.25 MG (50000 UNIT) PO CAPS: 50000.0000 [IU] | ORAL_CAPSULE | ORAL | 0 refills | Status: DC

## 2018-06-09 NOTE — Progress Notes (Signed)
Office: [phone]  /  Fax: [phone] TeleHealth Visit:  Cheryl Owens has verbally consented to this TeleHealth visit today. The patient is located at home, the provider is located at the [facility] office. The participants in this visit include the listed provider and patient. The visit was conducted today via Webex.  HPI:   Chief Complaint: OBESITY Cheryl Owens is here to discuss her progress with her obesity treatment plan. She is on the Category 3 plan and is following her eating plan approximately 80-85% of the time. She states she is exercising 0 minutes 0 times per week. Cheryl Owens reports that she is doing well overall. She is bored with breakfast and is asking about other dessert/snack options. She is currently working from home. We were unable to weigh the patient today for this TeleHealth visit. She feels as if she has lost weight since her last visit. She has lost 7 lbs since starting treatment with [place].  Vitamin D deficiency Cheryl Owens has a diagnosis of Vitamin D deficiency. She is currently taking prescription Vit D and denies nausea, vomiting or muscle weakness.  ASSESSMENT AND PLAN:  Vitamin D deficiency - Plan: Vitamin D, Ergocalciferol, (DRISDOL) 1.25 MG (50000 UT) CAPS capsule  Class 2 severe obesity with serious comorbidity and body mass index (BMI) of 38.0 to 38.9 in adult, unspecified obesity type ([place])  PLAN:  Vitamin D Deficiency Cheryl Owens was informed that low Vitamin D levels contributes to fatigue and are associated with obesity, breast, and colon cancer. She agrees to continue to take prescription Vit D @ 50,000 IU every week #4 with 0 refills and will follow-up for routine testing of Vitamin D, at least 2-3 times per year. She was informed of the risk of over-replacement of Vitamin D and agrees to not increase her dose unless she discusses this with [place] first. Cheryl Owens agrees to follow-up with our clinic in 3 weeks.  I spent > than 50% of the 25 minute visit on  counseling as documented in the note.  Obesity Cheryl Owens is currently in the action stage of change. As such, her goal is to continue with weight loss efforts. She has agreed to follow the Category 3 plan. Cheryl Owens has been instructed to work up to a goal of 150 minutes of combined cardio and strengthening exercise per week for weight loss and overall health benefits. We discussed the following Behavioral Modification Strategies today: work on meal planning, easy cooking plans, and keeping healthy foods in the home.  Cheryl Owens has agreed to follow-up with our clinic in 3 weeks. She was informed of the importance of frequent follow-up visits to maximize her success with intensive lifestyle modifications for her multiple health conditions.  I spent > than 50% of the 25 minute visit on counseling as documented in the note.   ALLERGIES: Allergies  Allergen Reactions  . Hydrochlorothiazide     REACTION: hypokalemia  . Benazepril     Nausea& vomiting, abd cramps    MEDICATIONS: Current Outpatient Medications on File Prior to Visit  Medication Sig Dispense Refill  . atorvastatin (LIPITOR) 20 MG tablet TAKE 1 TABLET DAILY 90 tablet 3  . buPROPion (WELLBUTRIN XL) 300 MG 24 hr tablet TAKE 1 TABLET EVERY MORNING 90 tablet 1  . clonazePAM (KLONOPIN) 0.5 MG tablet Take 0.5 mg by mouth 2 (two) times daily as needed for anxiety.    Marland Kitchen escitalopram (LEXAPRO) 10 MG tablet TAKE 1 TABLET DAILY 90 tablet 3  . [name] 0.35 MG tablet Take  1 tablet (0.35 mg total) by mouth daily. 3 Package 0  . labetalol (NORMODYNE) 100 MG tablet Take 1 tablet (100 mg total) by mouth 2 (two) times daily. 180 tablet 3  . spironolactone (ALDACTONE) 25 MG tablet Take 1 tablet (25 mg total) by mouth 2 (two) times daily. 180 tablet 3   No current facility-administered medications on file prior to visit.     PAST MEDICAL HISTORY: Past Medical History:  Diagnosis Date  . Anxiety   . Depression   . Dry mouth   . Fibroids   .  Gallbladder disease   . High cholesterol   . HTN (hypertension) 2007   seen in ER  with headaches  . Hypertension   . Hypokalemia 2007   due to HCTZ  . Nervousness   . Obesity   . Stress   . Swelling of lower extremity   . Trouble in sleeping     PAST SURGICAL HISTORY: Past Surgical History:  Procedure Laterality Date  . arm surgery     post trauma @ age 89  . BREAST SURGERY Right 2016   BREAST BX   . CHOLECYSTECTOMY     gall stones  . WISDOM TOOTH EXTRACTION      SOCIAL HISTORY: Social History   Tobacco Use  . Smoking status: Never Smoker  . Smokeless tobacco: Never Used  Substance Use Topics  . Alcohol use: No  . Drug use: No    FAMILY HISTORY: Family History  Problem Relation Age of Onset  . Cancer Maternal Grandmother        thyroid  . Hypertension Maternal Grandmother   . Hyperlipidemia Mother   . Hypertension Mother   . Depression Mother   . Anxiety disorder Mother   . Obesity Mother   . Breast cancer Maternal Aunt   . Kidney disease Maternal Aunt         X 2; 1 on Dialysis  . Cancer Maternal Aunt        ? primary; also had HTN  . Prostate cancer Paternal Uncle          3 uncles  . Prostate cancer Father   . Cancer Father   . Hypertension Paternal Grandmother   . Hypertension Maternal Aunt        X 2  . Bipolar disorder Paternal Aunt   . Stroke Neg Hx   . Diabetes Neg Hx   . Heart disease Neg Hx   . Colon cancer Neg Hx    ROS: Review of Systems  Gastrointestinal: Negative for nausea and vomiting.  Musculoskeletal:       Negative for muscle weakness.   PHYSICAL EXAM: Pt in no acute distress  RECENT LABS AND TESTS: BMET    Component Value Date/Time   NA 142 05/05/2018 1252   K 4.7 05/05/2018 1252   CL 105 05/05/2018 1252   CO2 21 05/05/2018 1252   GLUCOSE 85 05/05/2018 1252   GLUCOSE 87 06/08/2017 1343   BUN 12 05/05/2018 1252   CREATININE 1.12 (H) 05/05/2018 1252   CREATININE 1.28 (H) 02/19/2017 0851   CALCIUM 9.7 05/05/2018  1252   GFRNONAA 57 (L) 05/05/2018 1252   GFRAA 65 05/05/2018 1252   Lab Results  Component Value Date   HGBA1C 5.9 (H) 05/05/2018   HGBA1C 5.4 04/19/2015   HGBA1C 5.7 02/15/2013   HGBA1C 5.4 12/03/2010   HGBA1C 5.3 09/28/2008   Lab Results  Component Value Date   INSULIN 18.3 05/05/2018  CBC    Component Value Date/Time   WBC 8.0 05/05/2018 1252   WBC 7.5 11/02/2017 0839   RBC 4.72 05/05/2018 1252   RBC 4.54 11/02/2017 0839   HGB 12.8 05/05/2018 1252   HCT 40.4 05/05/2018 1252   PLT 293 11/02/2017 0839   MCV 86 05/05/2018 1252   MCH 27.1 05/05/2018 1252   MCH 27.5 11/02/2017 0839   MCHC 31.7 05/05/2018 1252   MCHC 31.9 (L) 11/02/2017 0839   RDW 13.6 05/05/2018 1252   LYMPHSABS 2.1 05/05/2018 1252   MONOABS 0.6 06/08/2017 1343   EOSABS 0.1 05/05/2018 1252   BASOSABS 0.0 05/05/2018 1252   Iron/TIBC/Ferritin/ %Sat No results found for: IRON, TIBC, FERRITIN, IRONPCTSAT Lipid Panel     Component Value Date/Time   CHOL 160 05/05/2018 1252   TRIG 65 05/05/2018 1252   HDL 54 05/05/2018 1252   CHOLHDL 3 06/08/2017 1343   VLDL 12.6 06/08/2017 1343   LDLCALC 93 05/05/2018 1252   LDLCALC 129 (H) 02/19/2017 0851   Hepatic Function Panel     Component Value Date/Time   PROT 7.0 05/05/2018 1252   ALBUMIN 4.1 05/05/2018 1252   AST 32 05/05/2018 1252   ALT 44 (H) 05/05/2018 1252   ALKPHOS 130 (H) 05/05/2018 1252   BILITOT 0.4 05/05/2018 1252   BILIDIR 0.1 04/20/2014 0804      Component Value Date/Time   TSH 1.250 05/05/2018 1252   TSH 1.94 06/08/2017 1343   TSH 1.70 02/19/2017 0851   Results for MARCELINA, MCLAURIN "MICHELE" (MRN [id number]) as of 06/09/2018 14:42  Ref. Range 05/05/2018 12:52  Vitamin D, 25-Hydroxy Latest Ref Range: 30.0 - 100.0 ng/mL 15.5 (L)    I, Michaelene Song, am acting as Location manager for [employer], PA-C I, Abby Potash, PA-C have reviewed above note and agree with its content

## 2018-06-09 NOTE — Progress Notes (Signed)
Office: [phone]  /  Fax: [phone]    Date: June 09, 2018  Appointment Start Time: 8:30am Duration: 27 minutes Provider: Glennie Isle, Psy.D. Type of Session: Individual Therapy  Location of Patient: Home Location of Provider: Office  Type of Contact: Telepsychological Visit via [employer]   Session Content: Prior to initiating telepsychological services, Cara was provided with an informed consent document via e-mail, which included the development of a safety plan (I.e., an emergency contact and emergency resources) in the event of an emergency/crisis. Sareen returned the completed consent form prior to today's appointment via [facility], as this provider's clinic is closed. This provider verbally reviewed the consent form during today's appointment prior to proceeding with the appointment. Shenelle verbally acknowledged understanding that she is ultimately responsible for understanding her insurance benefits as it relates to reimbursement of telepsychological services. This provider also reviewed confidentiality, as it relates to telepsychological services, as well as the rationale for telepsychological services. More specifically, this provider's clinic is closed for in-person visits due to COVID-19. Therapeutic services will resume to in-person appointments once the clinic re-opens. Tarren expressed understanding regarding the rationale for telepsychological services. In addition, this provider explained the telepsychological services informed consent document would be considered an addendum to the initial consent document. Therese verbally consented to proceed. Regarding MyChart messages, Jemiah verbally acknowledged understanding that any messages sent would be a part of her electronic medical record; therefore, visible to all providers.    Reynalda is a 53 y.o. female presenting via [place] [employer] for a follow-up appointment to address the previously established treatment goal of decreasing emotional  eating. Prior to proceeding with today's appointment, Tandy's physical location at the time of this appointment was obtained. Wyoma reported she was at home and provided the address. In the event of technical difficulties, Emmanuela shared a phone number she could be reached at. Raechell and this provider participated in today's telepsychological service. Also, Nicol denied anyone else being present in the room or on the [employer]. After reviewing the informed consent, this provider verbally administered the PHQ-9 and GAD-7, and conducted a brief check-in. Gerda shared, "I do not feel anxious when self isolating." While she is currently working from home, she discussed she may have to return to work in person. Regarding eating, she shared a decrease in "impulse" for foods that are "not good" for her. Maryssa explained she is trying to follow the meal plan and is not bringing "bad foods home." She discussed one episode of emotional eating where she went to [employer] and had a small tea, [place] fries, and a sandwich.  Nevertheless, she explained she followed the meal plan for the remainder of the day. Moreover, psychoeducation regarding triggers for emotional eating was provided. Shekia was encouraged to utilize the handout sent via MyChart between now and the next appointment to increase awareness of triggers and frequency. Suzann agreed. This provider also discussed behavioral strategies for specific triggers, such as placing the utensil down when conversing to avoid mindless eating. This provider sent Myrna a MyChart message with a handout for triggers for emotional eating. Prior to sending the message, this provider explained the message would be visible to all providers, as it would be part of the electronic medical record. Litisha verbally acknowledged understanding, and verbally consented to this provider sending the MyChart message. Haniyyah was receptive to today's session as evidenced by openness to sharing, responsiveness to  feedback, and willingness to explore triggers for emotional eating.  Mental Status Examination:  Appearance:  neat Behavior: cooperative Mood: euthymic Affect: mood congruent Speech: normal in rate, volume, and tone Eye Contact: appropriate Psychomotor Activity: appropriate Thought Process: linear, logical, and goal directed  Content/Perceptual Disturbances: denies suicidal and homicidal ideation, plan, and intent and no hallucinations, delusions, bizarre thinking or behavior reported or observed Orientation: time, person, place and purpose of appointment Cognition/Sensorium: memory, attention, language, and fund of knowledge intact  Insight: good Judgment: good  Structured Assessment Results: The Patient Health Questionnaire-9 (PHQ-9) is a self-report measure that assesses symptoms and severity of depression over the course of the last two weeks. Marquelle obtained a score of zero. Depression screen [facility] 2/9 06/09/2018  Decreased Interest 0  Down, Depressed, Hopeless 0  PHQ - 2 Score 0  Altered sleeping 0  Tired, decreased energy 0  Change in appetite 0  Feeling bad or failure about yourself  0  Trouble concentrating 0  Moving slowly or fidgety/restless 0  Suicidal thoughts 0  PHQ-9 Score 0  Difficult doing work/chores -   The Generalized Anxiety Disorder-7 (GAD-7) is a brief self-report measure that assesses symptoms of anxiety over the course of the last two weeks. Cobi obtained a score of zero.  GAD 7 : Generalized Anxiety Score 06/09/2018  Nervous, Anxious, on Edge 0  Control/stop worrying 0  Worry too much - different things 0  Trouble relaxing 0  Restless 0  Easily annoyed or irritable 0  Afraid - awful might happen 0  Total GAD 7 Score 0  Anxiety Difficulty -   Interventions:  Administration of PHQ-9 and GAD-7 for symptom monitoring Review of content from the previous session Empathic reflections and validation Psychoeducation regarding triggers for emotional eating  Positive reinforcement Rapport building Brief chart review  DSM-5 Diagnosis: 311 (F32.8) Other Specified Depressive Disorder, Emotional Eating Behaviors  Treatment Goal & Progress: During the initial appointment with this provider, the following treatment goal was established: decrease emotional eating. Progress is limited, as Kaysa has just begun treatment with this provider; however, she is receptive to the interaction and interventions and rapport is being established. Nevertheless, Hien has demonstrated some progress in her goal as evidenced by increased awareness of hunger patterns.   Plan: Hali continues to appear able and willing to participate as evidenced by engagement in reciprocal conversation, and asking questions for clarification as appropriate. The next appointment will be scheduled in two weeks, which will be via [employer]. Once this provider's office resumes in-person appointments, Kynley will be notified. The next session will focus on reviewing triggers for emotional eating, and the introduction of mindfulness.

## 2018-06-16 ENCOUNTER — Other Ambulatory Visit: Payer: Self-pay

## 2018-06-16 ENCOUNTER — Ambulatory Visit (INDEPENDENT_AMBULATORY_CARE_PROVIDER_SITE_OTHER): Payer: BC Managed Care – PPO | Admitting: Family Medicine

## 2018-06-16 ENCOUNTER — Encounter: Payer: Self-pay | Admitting: Family Medicine

## 2018-06-16 DIAGNOSIS — I10 Essential (primary) hypertension: Secondary | ICD-10-CM | POA: Diagnosis not present

## 2018-06-16 DIAGNOSIS — F411 Generalized anxiety disorder: Secondary | ICD-10-CM

## 2018-06-16 DIAGNOSIS — E785 Hyperlipidemia, unspecified: Secondary | ICD-10-CM | POA: Diagnosis not present

## 2018-06-16 DIAGNOSIS — R7989 Other specified abnormal findings of blood chemistry: Secondary | ICD-10-CM | POA: Diagnosis not present

## 2018-06-16 DIAGNOSIS — E669 Obesity, unspecified: Secondary | ICD-10-CM

## 2018-06-16 NOTE — Progress Notes (Signed)
Virtual Visit via Video Note  I connected with Cheryl Owens on 06/16/18 at  2:00 PM EDT by a video enabled telemedicine application and verified that I am speaking with the correct person using two identifiers.   I discussed the limitations of evaluation and management by telemedicine and the availability of in person appointments. The patient expressed understanding and agreed to proceed.  History of Present Illness: Pt is home -- f/u bp and depression.  She is seeing a Social worker and going to healthy weight and wellness.  She does not need any refills at this time   [facility] Observations/Objective: 236 lbs  5'7"  p 77  110/76 Afebrile rr normal  Pt NAD  Assessment and Plan: 1. Essential hypertension Well controlled, no changes to meds. Encouraged heart healthy diet such as the DASH diet and exercise as tolerated.    2. Anxiety state Stable con't meds and counseling0---- pt also seeing psych  3. Elevated serum creatinine con't to monitor Pt drinking more water--- labs to be done at [facility]  4. Hyperlipidemia LDL goal <100 Encouraged heart healthy diet, increase exercise, avoid trans fats, consider a krill oil cap daily  5. Obesity (BMI 30-39.9) con't healthy weight and wellness    Follow Up Instructions:    I discussed the assessment and treatment plan with the patient. The patient was provided an opportunity to ask questions and all were answered. The patient agreed with the plan and demonstrated an understanding of the instructions.   The patient was advised to call back or seek an in-person evaluation if the symptoms worsen or if the condition fails to improve as anticipated.  I  Ann Held, DO

## 2018-06-17 ENCOUNTER — Other Ambulatory Visit: Payer: Self-pay

## 2018-06-17 MED ORDER — CLONAZEPAM 0.5 MG PO TABS: ORAL_TABLET | ORAL | 0 refills | Status: DC

## 2018-06-22 ENCOUNTER — Ambulatory Visit (INDEPENDENT_AMBULATORY_CARE_PROVIDER_SITE_OTHER): Payer: BLUE CROSS/BLUE SHIELD | Admitting: Psychology

## 2018-06-22 ENCOUNTER — Encounter (INDEPENDENT_AMBULATORY_CARE_PROVIDER_SITE_OTHER): Payer: Self-pay

## 2018-06-22 ENCOUNTER — Other Ambulatory Visit: Payer: Self-pay

## 2018-06-22 DIAGNOSIS — F3289 Other specified depressive episodes: Secondary | ICD-10-CM | POA: Diagnosis not present

## 2018-06-22 NOTE — Progress Notes (Addendum)
Office: [phone]  /  Fax: [phone]    Date: June 22, 2018   Appointment Start Time: 9:34am Duration: 29 minutes Provider: Glennie Isle, Psy.D. Type of Session: Individual Therapy  Location of Patient: Home Location of Provider: Home Type of Contact: Telepsychological Visit via [employer]   Session Content: Cheryl Owens is a 53 y.o. female presenting via [place] [employer] for a follow-up appointment to address the previously established treatment goal of decreasing emotional eating. Today's appointment was a telepsychological visit, as this provider's clinic is closed for in-person visits due to COVID-19. Therapeutic services will resume to in-person appointments once the clinic re-opens. Cheryl Owens expressed understanding regarding the rationale for telepsychological services. Prior to proceeding with today's appointment, Cheryl Owens's physical location at the time of this appointment was obtained. Cheryl Owens reported she was at home and provided the address. In the event of technical difficulties, Cheryl Owens shared a phone number she could be reached at. Cheryl Owens and this provider participated in today's telepsychological service. Also, Cheryl Owens denied anyone else being present in the room or on the [employer]. Notably, this provider called Cheryl Owens at 9:32am as she had not presented for the [facility] appointment. She shared she called the office, as she was waiting for an e-mail to join. This provider explained the e-mail was sent with the secure hyperlink after the last appointment with this provider. Cheryl Owens was able to locate the e-mail and joined the appointment. As such, the appointment was initiated at 9:34am.    This provider conducted a brief check-in. Cheryl Owens shared, "It's pretty much the same" since the last appointment with this provider. She indicated she had a "desire" for chocolate chip cookies, which led her to "make a small batch." She also discussed engaging in portion control. Cheryl Owens further acknowledged experiencing the  "desire" to eat out, which included eating at [place] and [place]. She explained she was already out, and stopped at the fast food restaurants on different occassions. Moreover, Cheryl Owens discussed craving sweets. Thus, this provider discussed [place] yogurt bars, such as [employer]. Additionally, this provider discussed 100 calorie snack options. Cheryl Owens was agreeable to trying the aforementioned options. Moreover, this provider verbally administered the PHQ-9 and GAD-7 for symptom monitoring.   Overall, Cheryl Owens shared a reduction in emotional eating; therefore, requested that future appointments be "spread apart" as things are "status quo" right now. Nevertheless, she expressed concern about "falling back to old habits" once she resumes working in-person again. This was explored further, and Cheryl Owens discussed taking specific routes to purchase fast food. Cheryl Owens also discussed skipping meals to compensate for caloric intake for fast food meals. Thus, this provider discussed the importance of protein intake and its impact on being full. 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 This provider also discussed questions Cheryl Owens could ask herself relating to emotional and physical hunger to further help cultivate awareness in the moment and subsequent decisions (e.g., Am I hungry or Is there something bothering me?). A handout including the exercise was sent to Cheryl Owens via a [employer], and she was encouraged to practice it on a daily basis; Cheryl Owens agreed. Prior to sending the message, this provider explained the message would be visible to all providers, as it would be part of the electronic medical record. Cheryl Owens verbally acknowledged understanding, and verbally consented to this provider sending the MyChart message. Cheryl Owens was receptive to today's session as evidenced  by openness to  sharing, responsiveness to feedback, and willingness to practice the grounding exercise discussed.  Mental Status Examination:  Appearance: neat Behavior: cooperative Mood: euthymic Affect: mood congruent Speech: normal in rate, volume, and tone Eye Contact: appropriate Psychomotor Activity: appropriate Thought Process: linear, logical, and goal directed  Content/Perceptual Disturbances: denies suicidal and homicidal ideation, plan, and intent and no hallucinations, delusions, bizarre thinking or behavior reported or observed Orientation: time, person, place and purpose of appointment Cognition/Sensorium: memory, attention, language, and fund of knowledge intact  Insight: good Judgment: good  Structured Assessment Results: The Patient Health Questionnaire-9 (PHQ-9) is a self-report measure that assesses symptoms and severity of depression over the course of the last two weeks. Cheryl Owens obtained a score of 0. Depression screen [facility] 2/9 06/22/2018  Decreased Interest 0  Down, Depressed, Hopeless 0  PHQ - 2 Score 0  Altered sleeping 0  Tired, decreased energy 0  Change in appetite 0  Feeling bad or failure about yourself  0  Trouble concentrating 0  Moving slowly or fidgety/restless 0  Suicidal thoughts 0  PHQ-9 Score 0  Difficult doing work/chores -   The Generalized Anxiety Disorder-7 (GAD-7) is a brief self-report measure that assesses symptoms of anxiety over the course of the last two weeks. Cheryl Owens obtained a score of 0. GAD 7 : Generalized Anxiety Score 06/22/2018  Nervous, Anxious, on Edge 0  Control/stop worrying 0  Worry too much - different things 0  Trouble relaxing 0  Restless 0  Easily annoyed or irritable 0  Afraid - awful might happen 0  Total GAD 7 Score 0  Anxiety Difficulty -   Interventions:  Administration of PHQ-9 and GAD-7 for symptom monitoring Review of content from the previous session Empathic reflections and validation Positive reinforcement Brief chart  review Employed supportive psychotherapy interventions today to facilitate reduced distress, and to improve coping skills with identified stressors  DSM-5 Diagnosis: 311 (F32.8) Other Specified Depressive Disorder, Emotional Eating Behaviors  Treatment Goal & Progress: During the initial appointment with this provider, the following treatment goal was established: decrease emotional eating. Cheryl Owens has demonstrated progress in her goal as evidenced by increased awareness of hunger patterns and triggers for emotional eating. Despite episodes of emotional eating, Cheryl Owens indicated there has been a decrease and described having greater awareness of portion control.  Plan: Cheryl Owens continues to appear able and willing to participate as evidenced by engagement in reciprocal conversation, and asking questions for clarification as appropriate. Per Cheryl Owens' request, the next appointment will be scheduled in three weeks, which will be via [employer]. Once this provider's office resumes in-person appointments, Severa will be notified. The next session will focus on mindfulness.

## 2018-06-28 ENCOUNTER — Encounter (INDEPENDENT_AMBULATORY_CARE_PROVIDER_SITE_OTHER): Payer: Self-pay | Admitting: Physician Assistant

## 2018-06-28 ENCOUNTER — Ambulatory Visit (INDEPENDENT_AMBULATORY_CARE_PROVIDER_SITE_OTHER): Payer: BLUE CROSS/BLUE SHIELD | Admitting: Physician Assistant

## 2018-06-28 ENCOUNTER — Other Ambulatory Visit: Payer: Self-pay

## 2018-06-28 DIAGNOSIS — Z6838 Body mass index (BMI) 38.0-38.9, adult: Secondary | ICD-10-CM

## 2018-06-28 DIAGNOSIS — R7303 Prediabetes: Secondary | ICD-10-CM | POA: Diagnosis not present

## 2018-06-28 DIAGNOSIS — E559 Vitamin D deficiency, unspecified: Secondary | ICD-10-CM

## 2018-06-28 MED ORDER — VITAMIN D (ERGOCALCIFEROL) 1.25 MG (50000 UNIT) PO CAPS: 50000.0000 [IU] | ORAL_CAPSULE | ORAL | 0 refills | Status: DC

## 2018-06-28 NOTE — Progress Notes (Signed)
Office: [phone]  /  Fax: [phone] TeleHealth Visit:  Cheryl Owens has verbally consented to this TeleHealth visit today. The patient is located at home, the provider is located at the [employer] [facility] office. The participants in this visit include the listed provider and patient and any and all parties involved. The visit was conducted today via WebEx.  HPI:   Chief Complaint: OBESITY Cheryl Owens is here to discuss her progress with her obesity treatment plan. She is on the Category 3 plan and is following her eating plan approximately 75 % of the time. She states she is exercising 0 minutes 0 times per week. Cheryl Owens reports that she has lost weight. She weighed in at 233 pounds at home this morning. She continues to be bored with breakfast and she is looking for more dinner ideas. We were unable to weigh the patient today for this TeleHealth visit. She feels as if she has lost weight since her last visit. She has lost 19 lbs since starting treatment with [place].  Vitamin D deficiency [name] has a diagnosis of vitamin D deficiency. [name] is currently taking vit D and she denies nausea, vomiting or muscle weakness.  Pre-Diabetes Cheryl Owens has a diagnosis of prediabetes based on her elevated Hgb A1c and was informed this puts her at greater risk of developing diabetes. She is not taking medications. Cheryl Owens continues to work on diet and exercise to decrease risk of diabetes. Cheryl Owens denies polyphagia.  ASSESSMENT AND PLAN:  Vitamin D deficiency - Plan: Vitamin D, Ergocalciferol, (DRISDOL) 1.25 MG (50000 UT) CAPS capsule  Prediabetes  Class 2 severe obesity with serious comorbidity and body mass index (BMI) of 38.0 to 38.9 in adult, unspecified obesity type ([place])  PLAN:  Vitamin D Deficiency Cheryl Owens was informed that low vitamin D levels contributes to fatigue and are associated with obesity, breast, and colon cancer. She agrees to continue prescription Vit D @50 ,000 IU weekly #4 with no refills and  will follow up for routine testing of vitamin D, at least 2-3 times per year. She was informed of the risk of over-replacement of vitamin D and agrees to not increase her dose unless she discusses this with [place] first. [name] agrees to follow up as directed.  Pre-Diabetes Cheryl Owens will continue to work on weight loss, exercise, and decreasing simple carbohydrates in her diet to help decrease the risk of diabetes. She was informed that eating too many simple carbohydrates or too many calories at one sitting increases the likelihood of GI side effects. Cheryl Owens agreed to follow up with [place] as directed to monitor her progress.  Obesity Cheryl Owens is currently in the action stage of change. As such, her goal is to continue with weight loss efforts She has agreed to keep a food journal with 1500 calories and 95 grams of protein daily Cheryl Owens has been instructed to work up to a goal of 150 minutes of combined cardio and strengthening exercise per week for weight loss and overall health benefits. We discussed the following Behavioral Modification Strategies today: keeping healthy foods in the home and work on meal planning and easy cooking plans  Cheryl Owens has agreed to follow up with our clinic in 2 weeks. She was informed of the importance of frequent follow up visits to maximize her success with intensive lifestyle modifications for her multiple health conditions.  ALLERGIES: Allergies  Allergen Reactions  . Hydrochlorothiazide     REACTION: hypokalemia  . Benazepril     Nausea& vomiting, abd cramps  MEDICATIONS: Current Outpatient Medications on File Prior to Visit  Medication Sig Dispense Refill  . atorvastatin (LIPITOR) 20 MG tablet TAKE 1 TABLET DAILY 90 tablet 3  . buPROPion (WELLBUTRIN XL) 300 MG 24 hr tablet TAKE 1 TABLET EVERY MORNING 90 tablet 1  . clonazePAM (KLONOPIN) 0.5 MG tablet TAKE 1/2 TABLET IN THE MORNING AND 2 AT BEDTIME 225 tablet 0  . escitalopram (LEXAPRO) 10 MG tablet TAKE 1 TABLET DAILY 90  tablet 3  . [name] 0.35 MG tablet Take 1 tablet (0.35 mg total) by mouth daily. 3 Package 0  . labetalol (NORMODYNE) 100 MG tablet Take 1 tablet (100 mg total) by mouth 2 (two) times daily. 180 tablet 3  . spironolactone (ALDACTONE) 25 MG tablet Take 1 tablet (25 mg total) by mouth 2 (two) times daily. 180 tablet 3   No current facility-administered medications on file prior to visit.     PAST MEDICAL HISTORY: Past Medical History:  Diagnosis Date  . Anxiety   . Depression   . Dry mouth   . Fibroids   . Gallbladder disease   . High cholesterol   . HTN (hypertension) 2007   seen in ER  with headaches  . Hypertension   . Hypokalemia 2007   due to HCTZ  . Nervousness   . Obesity   . Stress   . Swelling of lower extremity   . Trouble in sleeping     PAST SURGICAL HISTORY: Past Surgical History:  Procedure Laterality Date  . arm surgery     post trauma @ age 62  . BREAST SURGERY Right 2016   BREAST BX   . CHOLECYSTECTOMY     gall stones  . WISDOM TOOTH EXTRACTION      SOCIAL HISTORY: Social History   Tobacco Use  . Smoking status: Never Smoker  . Smokeless tobacco: Never Used  Substance Use Topics  . Alcohol use: No  . Drug use: No    FAMILY HISTORY: Family History  Problem Relation Age of Onset  . Cancer Maternal Grandmother        thyroid  . Hypertension Maternal Grandmother   . Hyperlipidemia Mother   . Hypertension Mother   . Depression Mother   . Anxiety disorder Mother   . Obesity Mother   . Breast cancer Maternal Aunt   . Kidney disease Maternal Aunt         X 2; 1 on Dialysis  . Cancer Maternal Aunt        ? primary; also had HTN  . Prostate cancer Paternal Uncle          3 uncles  . Prostate cancer Father   . Cancer Father   . Hypertension Paternal Grandmother   . Hypertension Maternal Aunt        X 2  . Bipolar disorder Paternal Aunt   . Stroke Neg Hx   . Diabetes Neg Hx   . Heart disease Neg Hx   . Colon cancer Neg Hx     ROS:  Review of Systems  Constitutional: Positive for weight loss.  Gastrointestinal: Negative for nausea and vomiting.  Musculoskeletal:       Negative for muscle weakness  Endo/Heme/Allergies:       Negative for polyphagia    PHYSICAL EXAM: Pt in no acute distress  RECENT LABS AND TESTS: BMET    Component Value Date/Time   NA 142 05/05/2018 1252   K 4.7 05/05/2018 1252   CL 105 05/05/2018  1252   CO2 21 05/05/2018 1252   GLUCOSE 85 05/05/2018 1252   GLUCOSE 87 06/08/2017 1343   BUN 12 05/05/2018 1252   CREATININE 1.12 (H) 05/05/2018 1252   CREATININE 1.28 (H) 02/19/2017 0851   CALCIUM 9.7 05/05/2018 1252   GFRNONAA 57 (L) 05/05/2018 1252   GFRAA 65 05/05/2018 1252   Lab Results  Component Value Date   HGBA1C 5.9 (H) 05/05/2018   HGBA1C 5.4 04/19/2015   HGBA1C 5.7 02/15/2013   HGBA1C 5.4 12/03/2010   HGBA1C 5.3 09/28/2008   Lab Results  Component Value Date   INSULIN 18.3 05/05/2018   CBC    Component Value Date/Time   WBC 8.0 05/05/2018 1252   WBC 7.5 11/02/2017 0839   RBC 4.72 05/05/2018 1252   RBC 4.54 11/02/2017 0839   HGB 12.8 05/05/2018 1252   HCT 40.4 05/05/2018 1252   PLT 293 11/02/2017 0839   MCV 86 05/05/2018 1252   MCH 27.1 05/05/2018 1252   MCH 27.5 11/02/2017 0839   MCHC 31.7 05/05/2018 1252   MCHC 31.9 (L) 11/02/2017 0839   RDW 13.6 05/05/2018 1252   LYMPHSABS 2.1 05/05/2018 1252   MONOABS 0.6 06/08/2017 1343   EOSABS 0.1 05/05/2018 1252   BASOSABS 0.0 05/05/2018 1252   Iron/TIBC/Ferritin/ %Sat No results found for: IRON, TIBC, FERRITIN, IRONPCTSAT Lipid Panel     Component Value Date/Time   CHOL 160 05/05/2018 1252   TRIG 65 05/05/2018 1252   HDL 54 05/05/2018 1252   CHOLHDL 3 06/08/2017 1343   VLDL 12.6 06/08/2017 1343   LDLCALC 93 05/05/2018 1252   LDLCALC 129 (H) 02/19/2017 0851   Hepatic Function Panel     Component Value Date/Time   PROT 7.0 05/05/2018 1252   ALBUMIN 4.1 05/05/2018 1252   AST 32 05/05/2018 1252   ALT 44  (H) 05/05/2018 1252   ALKPHOS 130 (H) 05/05/2018 1252   BILITOT 0.4 05/05/2018 1252   BILIDIR 0.1 04/20/2014 0804      Component Value Date/Time   TSH 1.250 05/05/2018 1252   TSH 1.94 06/08/2017 1343   TSH 1.70 02/19/2017 0851    Results for TALIA, HOHEISEL "MICHELE" (MRN [id number]) as of 06/28/2018 16:33  Ref. Range 05/05/2018 12:52  Vitamin D, 25-Hydroxy Latest Ref Range: 30.0 - 100.0 ng/mL 15.5 (L)    I, Doreene Nest, am acting as transcriptionist for Abby Potash, PA-C I, Abby Potash, PA-C have reviewed above note and agree with its content

## 2018-07-01 ENCOUNTER — Other Ambulatory Visit: Payer: Self-pay | Admitting: Obstetrics & Gynecology

## 2018-07-08 ENCOUNTER — Encounter: Payer: BLUE CROSS/BLUE SHIELD | Admitting: Obstetrics & Gynecology

## 2018-07-13 ENCOUNTER — Encounter (INDEPENDENT_AMBULATORY_CARE_PROVIDER_SITE_OTHER): Payer: Self-pay | Admitting: Physician Assistant

## 2018-07-13 ENCOUNTER — Other Ambulatory Visit: Payer: Self-pay

## 2018-07-13 ENCOUNTER — Ambulatory Visit (INDEPENDENT_AMBULATORY_CARE_PROVIDER_SITE_OTHER): Payer: BLUE CROSS/BLUE SHIELD | Admitting: Psychology

## 2018-07-13 ENCOUNTER — Encounter (INDEPENDENT_AMBULATORY_CARE_PROVIDER_SITE_OTHER): Payer: Self-pay

## 2018-07-13 ENCOUNTER — Ambulatory Visit (INDEPENDENT_AMBULATORY_CARE_PROVIDER_SITE_OTHER): Payer: BLUE CROSS/BLUE SHIELD | Admitting: Physician Assistant

## 2018-07-13 DIAGNOSIS — E559 Vitamin D deficiency, unspecified: Secondary | ICD-10-CM

## 2018-07-13 DIAGNOSIS — F3289 Other specified depressive episodes: Secondary | ICD-10-CM

## 2018-07-13 DIAGNOSIS — Z6838 Body mass index (BMI) 38.0-38.9, adult: Secondary | ICD-10-CM

## 2018-07-13 NOTE — Progress Notes (Signed)
Office: [phone]  /  Fax: [phone]    Date: Jul 13, 2018   Appointment Start Time: 9:31am Duration: 29 minutes Provider: Glennie Isle, Psy.D. Type of Session: Individual Therapy  Location of Patient: Home Location of Provider: Provider's Home Type of Contact: Telepsychological Visit via [employer] WebEx   Session Content: Haleemah is a 53 y.o. female presenting via [place] for a follow-up appointment to address the previously established treatment goal of decreasing emotional eating. Today's appointment was a telepsychological visit, as this provider's clinic is closed for in-person visits due to COVID-19. Therapeutic services will resume to in-person appointments once the clinic re-opens. Khala expressed understanding regarding the rationale for telepsychological services, and provided verbal consent for today's appointment. Prior to proceeding with today's appointment, Yalonda's physical location at the time of this appointment was obtained. Lynnox reported she was at home and provided the address. In the event of technical difficulties, Haila shared a phone number she could be reached at. Mayce and this provider participated in today's telepsychological service. Also, Kellan denied anyone else being present in the room or on the WebEx appointment.  This provider conducted a brief check-in and verbally administered the PHQ-9 and GAD-7. Vallorie shared, "Still pretty much status quo." She indicated she has not started to go back to work yet. When out, Kenlyn shared, "I'm still have issues behaving." She acknowledged ordering [place] food, pizza, and food from [place]. This provider explored what Ayah ate for her other meals/snacks on the days she ate out. The day she ate pizza, Cashmere noted she did not eat anything else that day as she felt she ate her allotted calories for the day. Jenella further shared aside from the pizza, she did not plan to eat out; therefore, she tried to eat congruent to the meal plan.  As such, this provider discussed the importance of planning ahead when deciding to eat out, as it would help Alechia to eat congruent to the structured meal plan for other meals and assist with protein intake. Additionally, this provider discussed the importance of protein intake on feeling full. Related to mindfulness, this provider and Lucita Ferrara discussed sitting down to eat versus eating on the go, as meals are consumed much more rapidly and there is an increased likelihood of overeating when food is consumed on the go. This provider also discussed the informal practice of mindfulness when eating (e.g., paying attention to senses) to further help slow the eating process and enrich the experience. This provider discussed the utilization of YouTube for mindfulness exercises for formal practice, specifically videos by mark Jimmye Norman. Khaniya was encouraged to journal her mindfulness practice to further assist in developing insight regarding its impact on eating. Marlis agreed; therefore, she was sent a log via a MyChart message. Prior to sending the message, this provider explained the message would be visible to all providers, as it would be part of the electronic medical record. Zakira verbally acknowledged understanding, and verbally consented to this provider sending the MyChart message. Furthermore, termination planning was discussed. At the onset of therapeutic services the duration of treatment was discussed to be a total of 4 to 6 sessions; however, due to the pandemic, this provider will continue meeting with [facility] as deemed necessary and appropriate. Anea verbally acknowledged understanding of the aforementioned, and was receptive to [employer] benefits. Overall, Remmy was receptive to today's session as evidenced by openness to sharing, responsiveness to feedback, and willingness to engage in mindfulness and journal her experiences.  Mental Status Examination:  Appearance: neat Behavior: cooperative  Mood: euthymic Affect: mood congruent Speech: normal in rate, volume, and tone Eye Contact: appropriate Psychomotor Activity: appropriate Thought Process: linear, logical, and goal directed  Content/Perceptual Disturbances: denies suicidal and homicidal ideation, plan, and intent and no hallucinations, delusions, bizarre thinking or behavior reported or observed Orientation: time, person, place and purpose of appointment Cognition/Sensorium: memory, attention, language, and fund of knowledge intact  Insight: good Judgment: good  Structured Assessment Results: The Patient Health Questionnaire-9 (PHQ-9) is a self-report measure that assesses symptoms and severity of depression over the course of the last two weeks. Pinkey obtained a score of 1 suggesting minimal depression. Rhyder finds the endorsed symptoms to be not difficult at all. Depression screen [facility] 2/9 07/13/2018  Decreased Interest 0  Down, Depressed, Hopeless 0  PHQ - 2 Score 0  Altered sleeping 0  Tired, decreased energy 0  Change in appetite 0  Feeling bad or failure about yourself  1  Trouble concentrating 0  Moving slowly or fidgety/restless 0  Suicidal thoughts 0  PHQ-9 Score 1  Difficult doing work/chores -   The Generalized Anxiety Disorder-7 (GAD-7) is a brief self-report measure that assesses symptoms of anxiety over the course of the last two weeks. Hanin obtained a score of 0.  GAD 7 : Generalized Anxiety Score 07/13/2018  Nervous, Anxious, on Edge 0  Control/stop worrying 0  Worry too much - different things 0  Trouble relaxing 0  Restless 0  Easily annoyed or irritable 0  Afraid - awful might happen 0  Total GAD 7 Score 0  Anxiety Difficulty -   Interventions:  Administered PHQ-9 and GAD-7 for symptom monitoring Reviewed content from the previous session Provided empathic reflections and validation Discussed termination planning Provided positive reinforcement Employed acceptance and commitment interventions  to emphasize mindfulness and acceptance without struggle Conducted a brief chart review  DSM-5 Diagnosis: 311 (F32.8) Other Specified Depressive Disorder, Emotional Eating Behaviors  Treatment Goal & Progress: During the initial appointment with this provider, the following treatment goal was established: decrease emotional eating. Alichia has demonstrated progress in her goal as evidenced by increased awareness of hunger patterns and triggers for emotional eating. Despite deviations from the structured meal plan, Monasia appeared receptive to employing learned skills to assist with coping and increase likelihood of eating congruent to the structured meal plan.   Plan: Kenady continues to appear able and willing to participate as evidenced by engagement in reciprocal conversation, and asking questions for clarification as appropriate. The next appointment will be scheduled in three weeks per Lucita Ferrara' request, which will be via [employer]. Once this provider's office resumes in-person appointments, [place] will be notified. The next session will focus further on mindfulness.

## 2018-07-14 NOTE — Progress Notes (Signed)
Office: [phone]  /  Fax: [phone] TeleHealth Visit:  Cheryl Owens has verbally consented to this TeleHealth visit today. The patient is located at home, the provider is located at the [employer] [facility] office. The participants in this visit include the listed provider and patient. The visit was conducted today via webex.  HPI:   Chief Complaint: OBESITY Cheryl Owens is here to discuss her progress with her obesity treatment plan. She is on the keep a food journal with 1500 calories and 95 grams of protein daily and is following her eating plan approximately 75 % of the time. She states she is exercising 0 minutes 0 times per week. Cheryl Owens reports that her weight today was 231 lbs. She indulged in pizza and chinese food recently, but is ready to start meal planning more.  We were unable to weigh the patient today for this TeleHealth visit. She feels as if she has lost 2 lbs since her last visit. She has lost 7-9 lbs since starting treatment with [place].  Vitamin D Deficiency Cheryl Owens has a diagnosis of vitamin D deficiency. She is currently taking prescription Vit D and denies nausea, vomiting or muscle weakness.  ASSESSMENT AND PLAN:  Vitamin D deficiency  Class 2 severe obesity with serious comorbidity and body mass index (BMI) of 38.0 to 38.9 in adult, unspecified obesity type ([place])  PLAN:  Vitamin D Deficiency Cheryl Owens was informed that low vitamin D levels contributes to fatigue and are associated with obesity, breast, and colon cancer. Cheryl Owens agrees to continue taking prescription Vit D @50 ,000 IU every week and will follow up for routine testing of vitamin D, at least 2-3 times per year. She was informed of the risk of over-replacement of vitamin D and agrees to not increase her dose unless she discusses this with [place] first. Cheryl Owens agrees to follow up with our clinic in 2 weeks.  Obesity Cheryl Owens is currently in the action stage of change. As such, her goal is to continue with weight loss  efforts She has agreed to keep a food journal with 1500 calories and 95 grams of protein daily Cheryl Owens has been instructed to work up to a goal of 150 minutes of combined cardio and strengthening exercise per week for weight loss and overall health benefits. We discussed the following Behavioral Modification Strategies today: work on meal planning and easy cooking plans and keeping healthy foods in the home   Cheryl Owens has agreed to follow up with our clinic in 2 weeks. She was informed of the importance of frequent follow up visits to maximize her success with intensive lifestyle modifications for her multiple health conditions.  ALLERGIES: Allergies  Allergen Reactions  . Hydrochlorothiazide     REACTION: hypokalemia  . Benazepril     Nausea& vomiting, abd cramps    MEDICATIONS: Current Outpatient Medications on File Prior to Visit  Medication Sig Dispense Refill  . atorvastatin (LIPITOR) 20 MG tablet TAKE 1 TABLET DAILY 90 tablet 3  . buPROPion (WELLBUTRIN XL) 300 MG 24 hr tablet TAKE 1 TABLET EVERY MORNING 90 tablet 1  . clonazePAM (KLONOPIN) 0.5 MG tablet TAKE 1/2 TABLET IN THE MORNING AND 2 AT BEDTIME 225 tablet 0  . escitalopram (LEXAPRO) 10 MG tablet TAKE 1 TABLET DAILY 90 tablet 3  . [name] 0.35 MG tablet TAKE 1 TABLET DAILY 84 tablet 0  . labetalol (NORMODYNE) 100 MG tablet Take 1 tablet (100 mg total) by mouth 2 (two) times daily. 180 tablet 3  . spironolactone (  ALDACTONE) 25 MG tablet Take 1 tablet (25 mg total) by mouth 2 (two) times daily. 180 tablet 3  . Vitamin D, Ergocalciferol, (DRISDOL) 1.25 MG (50000 UT) CAPS capsule Take 1 capsule (50,000 Units total) by mouth every 7 (seven) days. 4 capsule 0   No current facility-administered medications on file prior to visit.     PAST MEDICAL HISTORY: Past Medical History:  Diagnosis Date  . Anxiety   . Depression   . Dry mouth   . Fibroids   . Gallbladder disease   . High cholesterol   . HTN (hypertension) 2007   seen in  ER  with headaches  . Hypertension   . Hypokalemia 2007   due to HCTZ  . Nervousness   . Obesity   . Stress   . Swelling of lower extremity   . Trouble in sleeping     PAST SURGICAL HISTORY: Past Surgical History:  Procedure Laterality Date  . arm surgery     post trauma @ age 87  . BREAST SURGERY Right 2016   BREAST BX   . CHOLECYSTECTOMY     gall stones  . WISDOM TOOTH EXTRACTION      SOCIAL HISTORY: Social History   Tobacco Use  . Smoking status: Never Smoker  . Smokeless tobacco: Never Used  Substance Use Topics  . Alcohol use: No  . Drug use: No    FAMILY HISTORY: Family History  Problem Relation Age of Onset  . Cancer Maternal Grandmother        thyroid  . Hypertension Maternal Grandmother   . Hyperlipidemia Mother   . Hypertension Mother   . Depression Mother   . Anxiety disorder Mother   . Obesity Mother   . Breast cancer Maternal Aunt   . Kidney disease Maternal Aunt         X 2; 1 on Dialysis  . Cancer Maternal Aunt        ? primary; also had HTN  . Prostate cancer Paternal Uncle          3 uncles  . Prostate cancer Father   . Cancer Father   . Hypertension Paternal Grandmother   . Hypertension Maternal Aunt        X 2  . Bipolar disorder Paternal Aunt   . Stroke Neg Hx   . Diabetes Neg Hx   . Heart disease Neg Hx   . Colon cancer Neg Hx     ROS: Review of Systems  Constitutional: Positive for weight loss.  Gastrointestinal: Negative for nausea and vomiting.  Musculoskeletal:       Negative muscle weakness    PHYSICAL EXAM: Pt in no acute distress  RECENT LABS AND TESTS: BMET    Component Value Date/Time   NA 142 05/05/2018 1252   K 4.7 05/05/2018 1252   CL 105 05/05/2018 1252   CO2 21 05/05/2018 1252   GLUCOSE 85 05/05/2018 1252   GLUCOSE 87 06/08/2017 1343   BUN 12 05/05/2018 1252   CREATININE 1.12 (H) 05/05/2018 1252   CREATININE 1.28 (H) 02/19/2017 0851   CALCIUM 9.7 05/05/2018 1252   GFRNONAA 57 (L) 05/05/2018 1252    GFRAA 65 05/05/2018 1252   Lab Results  Component Value Date   HGBA1C 5.9 (H) 05/05/2018   HGBA1C 5.4 04/19/2015   HGBA1C 5.7 02/15/2013   HGBA1C 5.4 12/03/2010   HGBA1C 5.3 09/28/2008   Lab Results  Component Value Date   INSULIN 18.3 05/05/2018   CBC  Component Value Date/Time   WBC 8.0 05/05/2018 1252   WBC 7.5 11/02/2017 0839   RBC 4.72 05/05/2018 1252   RBC 4.54 11/02/2017 0839   HGB 12.8 05/05/2018 1252   HCT 40.4 05/05/2018 1252   PLT 293 11/02/2017 0839   MCV 86 05/05/2018 1252   MCH 27.1 05/05/2018 1252   MCH 27.5 11/02/2017 0839   MCHC 31.7 05/05/2018 1252   MCHC 31.9 (L) 11/02/2017 0839   RDW 13.6 05/05/2018 1252   LYMPHSABS 2.1 05/05/2018 1252   MONOABS 0.6 06/08/2017 1343   EOSABS 0.1 05/05/2018 1252   BASOSABS 0.0 05/05/2018 1252   Iron/TIBC/Ferritin/ %Sat No results found for: IRON, TIBC, FERRITIN, IRONPCTSAT Lipid Panel     Component Value Date/Time   CHOL 160 05/05/2018 1252   TRIG 65 05/05/2018 1252   HDL 54 05/05/2018 1252   CHOLHDL 3 06/08/2017 1343   VLDL 12.6 06/08/2017 1343   LDLCALC 93 05/05/2018 1252   LDLCALC 129 (H) 02/19/2017 0851   Hepatic Function Panel     Component Value Date/Time   PROT 7.0 05/05/2018 1252   ALBUMIN 4.1 05/05/2018 1252   AST 32 05/05/2018 1252   ALT 44 (H) 05/05/2018 1252   ALKPHOS 130 (H) 05/05/2018 1252   BILITOT 0.4 05/05/2018 1252   BILIDIR 0.1 04/20/2014 0804      Component Value Date/Time   TSH 1.250 05/05/2018 1252   TSH 1.94 06/08/2017 1343   TSH 1.70 02/19/2017 0851      I, Trixie Dredge, am acting as transcriptionist for Abby Potash, PA-C I, Abby Potash, PA-C have reviewed above note and agree with its content

## 2018-07-20 ENCOUNTER — Telehealth: Payer: Self-pay | Admitting: Psychiatry

## 2018-07-20 NOTE — Telephone Encounter (Signed)
Pt RS 5/14 appt until 8/59 stating a conflict. Wellbutrin XL is running out so she is requesting it  be submitted on file so it will be ready when she is out.

## 2018-07-21 ENCOUNTER — Other Ambulatory Visit: Payer: Self-pay

## 2018-07-21 DIAGNOSIS — F320 Major depressive disorder, single episode, mild: Secondary | ICD-10-CM

## 2018-07-21 MED ORDER — BUPROPION HCL ER (XL) 300 MG PO TB24: 300.0000 mg | ORAL_TABLET | Freq: Every morning | ORAL | 0 refills | Status: DC

## 2018-07-21 NOTE — Telephone Encounter (Signed)
Spoke to pt requesting [facility], will submit

## 2018-07-21 NOTE — Telephone Encounter (Signed)
Refill ok just need to contact pt which pharmacy

## 2018-07-22 ENCOUNTER — Ambulatory Visit: Payer: BLUE CROSS/BLUE SHIELD | Admitting: Psychiatry

## 2018-07-23 ENCOUNTER — Other Ambulatory Visit: Payer: Self-pay | Admitting: Family Medicine

## 2018-07-23 DIAGNOSIS — I10 Essential (primary) hypertension: Secondary | ICD-10-CM

## 2018-07-28 ENCOUNTER — Ambulatory Visit (INDEPENDENT_AMBULATORY_CARE_PROVIDER_SITE_OTHER): Payer: BLUE CROSS/BLUE SHIELD | Admitting: Physician Assistant

## 2018-07-28 ENCOUNTER — Other Ambulatory Visit: Payer: Self-pay

## 2018-07-28 ENCOUNTER — Encounter (INDEPENDENT_AMBULATORY_CARE_PROVIDER_SITE_OTHER): Payer: Self-pay | Admitting: Physician Assistant

## 2018-07-28 DIAGNOSIS — E559 Vitamin D deficiency, unspecified: Secondary | ICD-10-CM | POA: Diagnosis not present

## 2018-07-28 DIAGNOSIS — Z6838 Body mass index (BMI) 38.0-38.9, adult: Secondary | ICD-10-CM

## 2018-07-29 NOTE — Progress Notes (Signed)
Office: [phone]  /  Fax: [phone] TeleHealth Visit:  Cheryl Owens has verbally consented to this TeleHealth visit today. The patient is located at home, the provider is located at the [employer] [facility] office. The participants in this visit include the listed provider and patient. The visit was conducted today via Webex (30 minutes).  HPI:   Chief Complaint: OBESITY Cheryl Owens is here to discuss her progress with her obesity treatment plan. She is keeping a food journal with 1500 calories and 95 grams of protein daily and is following her eating plan approximately 60-70% of the time. She states she is exercising 0 minutes 0 times per week. Cheryl Owens reports her most recent weight is 230 lbs. She is not reaching her protein goal daily and is not meal planning. We were unable to weigh the patient today for this TeleHealth visit. She states her most recent weight was 230 lbs. She has lost 7 lbs since starting treatment with [place].  Vitamin D deficiency Cheryl Owens has a diagnosis of Vitamin D deficiency. She is currently taking Vit D and denies nausea, vomiting or muscle weakness.  ASSESSMENT AND PLAN:  Vitamin D deficiency  Class 2 severe obesity with serious comorbidity and body mass index (BMI) of 38.0 to 38.9 in adult, unspecified obesity type ([place])  PLAN:  Vitamin D Deficiency Cheryl Owens was informed that low Vitamin D levels contributes to fatigue and are associated with obesity, breast, and colon cancer. She agrees to continue taking Vit D and will follow-up for routine testing of Vitamin D, at least 2-3 times per year. She was informed of the risk of over-replacement of Vitamin D and agrees to not increase her dose unless she discusses this with [place] first. Cheryl Owens agrees to follow-up with our clinic in 2 weeks.  Obesity Cheryl Owens is currently in the action stage of change. As such, her goal is to continue with weight loss efforts. She has agreed to keep a food journal with 1500 calories and 95  grams of protein daily. Cheryl Owens has been instructed to work up to a goal of 150 minutes of combined cardio and strengthening exercise per week for weight loss and overall health benefits. We discussed the following Behavioral Modification Strategies today: work on meal planning, easy cooking plans, and keeping healthy foods in the home.  Cheryl Owens has agreed to follow-up with our clinic in 2 weeks. She was informed of the importance of frequent follow-up visits to maximize her success with intensive lifestyle modifications for her multiple health conditions.  I spent > than 50% of the 25 minute visit on counseling as documented in the note.   ALLERGIES: Allergies  Allergen Reactions  . Hydrochlorothiazide     REACTION: hypokalemia  . Benazepril     Nausea& vomiting, abd cramps    MEDICATIONS: Current Outpatient Medications on File Prior to Visit  Medication Sig Dispense Refill  . atorvastatin (LIPITOR) 20 MG tablet TAKE 1 TABLET DAILY 90 tablet 3  . buPROPion (WELLBUTRIN XL) 300 MG 24 hr tablet Take 1 tablet (300 mg total) by mouth every morning. 90 tablet 0  . clonazePAM (KLONOPIN) 0.5 MG tablet TAKE 1/2 TABLET IN THE MORNING AND 2 AT BEDTIME 225 tablet 0  . escitalopram (LEXAPRO) 10 MG tablet TAKE 1 TABLET DAILY 90 tablet 3  . Cheryl Owens 0.35 MG tablet TAKE 1 TABLET DAILY 84 tablet 0  . labetalol (NORMODYNE) 100 MG tablet Take 1 tablet (100 mg total) by mouth 2 (two) times daily. 180 tablet 3  .  spironolactone (ALDACTONE) 25 MG tablet TAKE 1 TABLET TWICE A DAY 180 tablet 3  . Vitamin D, Ergocalciferol, (DRISDOL) 1.25 MG (50000 UT) CAPS capsule Take 1 capsule (50,000 Units total) by mouth every 7 (seven) days. 4 capsule 0   No current facility-administered medications on file prior to visit.     PAST MEDICAL HISTORY: Past Medical History:  Diagnosis Date  . Anxiety   . Depression   . Dry mouth   . Fibroids   . Gallbladder disease   . High cholesterol   . HTN (hypertension) 2007    seen in ER  with headaches  . Hypertension   . Hypokalemia 2007   due to HCTZ  . Nervousness   . Obesity   . Stress   . Swelling of lower extremity   . Trouble in sleeping     PAST SURGICAL HISTORY: Past Surgical History:  Procedure Laterality Date  . arm surgery     post trauma @ age 29  . BREAST SURGERY Right 2016   BREAST BX   . CHOLECYSTECTOMY     gall stones  . WISDOM TOOTH EXTRACTION      SOCIAL HISTORY: Social History   Tobacco Use  . Smoking status: Never Smoker  . Smokeless tobacco: Never Used  Substance Use Topics  . Alcohol use: No  . Drug use: No    FAMILY HISTORY: Family History  Problem Relation Age of Onset  . Cancer Maternal Grandmother        thyroid  . Hypertension Maternal Grandmother   . Hyperlipidemia Mother   . Hypertension Mother   . Depression Mother   . Anxiety disorder Mother   . Obesity Mother   . Breast cancer Maternal Aunt   . Kidney disease Maternal Aunt         X 2; 1 on Dialysis  . Cancer Maternal Aunt        ? primary; also had HTN  . Prostate cancer Paternal Uncle          3 uncles  . Prostate cancer Father   . Cancer Father   . Hypertension Paternal Grandmother   . Hypertension Maternal Aunt        X 2  . Bipolar disorder Paternal Aunt   . Stroke Neg Hx   . Diabetes Neg Hx   . Heart disease Neg Hx   . Colon cancer Neg Hx    ROS: Review of Systems  Gastrointestinal: Negative for nausea and vomiting.  Musculoskeletal:       Negative for muscle weakness.   PHYSICAL EXAM: Pt in no acute distress  RECENT LABS AND TESTS: BMET    Component Value Date/Time   NA 142 05/05/2018 1252   K 4.7 05/05/2018 1252   CL 105 05/05/2018 1252   CO2 21 05/05/2018 1252   GLUCOSE 85 05/05/2018 1252   GLUCOSE 87 06/08/2017 1343   BUN 12 05/05/2018 1252   CREATININE 1.12 (H) 05/05/2018 1252   CREATININE 1.28 (H) 02/19/2017 0851   CALCIUM 9.7 05/05/2018 1252   GFRNONAA 57 (L) 05/05/2018 1252   GFRAA 65 05/05/2018 1252    Lab Results  Component Value Date   HGBA1C 5.9 (H) 05/05/2018   HGBA1C 5.4 04/19/2015   HGBA1C 5.7 02/15/2013   HGBA1C 5.4 12/03/2010   HGBA1C 5.3 09/28/2008   Lab Results  Component Value Date   INSULIN 18.3 05/05/2018   CBC    Component Value Date/Time   WBC 8.0 05/05/2018 1252  WBC 7.5 11/02/2017 0839   RBC 4.72 05/05/2018 1252   RBC 4.54 11/02/2017 0839   HGB 12.8 05/05/2018 1252   HCT 40.4 05/05/2018 1252   PLT 293 11/02/2017 0839   MCV 86 05/05/2018 1252   MCH 27.1 05/05/2018 1252   MCH 27.5 11/02/2017 0839   MCHC 31.7 05/05/2018 1252   MCHC 31.9 (L) 11/02/2017 0839   RDW 13.6 05/05/2018 1252   LYMPHSABS 2.1 05/05/2018 1252   MONOABS 0.6 06/08/2017 1343   EOSABS 0.1 05/05/2018 1252   BASOSABS 0.0 05/05/2018 1252   Iron/TIBC/Ferritin/ %Sat No results found for: IRON, TIBC, FERRITIN, IRONPCTSAT Lipid Panel     Component Value Date/Time   CHOL 160 05/05/2018 1252   TRIG 65 05/05/2018 1252   HDL 54 05/05/2018 1252   CHOLHDL 3 06/08/2017 1343   VLDL 12.6 06/08/2017 1343   LDLCALC 93 05/05/2018 1252   LDLCALC 129 (H) 02/19/2017 0851   Hepatic Function Panel     Component Value Date/Time   PROT 7.0 05/05/2018 1252   ALBUMIN 4.1 05/05/2018 1252   AST 32 05/05/2018 1252   ALT 44 (H) 05/05/2018 1252   ALKPHOS 130 (H) 05/05/2018 1252   BILITOT 0.4 05/05/2018 1252   BILIDIR 0.1 04/20/2014 0804      Component Value Date/Time   TSH 1.250 05/05/2018 1252   TSH 1.94 06/08/2017 1343   TSH 1.70 02/19/2017 0851   Results for IRVING, LUBBERS "Cheryl Owens" (MRN [id number]) as of 07/29/2018 09:30  Ref. Range 05/05/2018 12:52  Vitamin D, 25-Hydroxy Latest Ref Range: 30.0 - 100.0 ng/mL 15.5 (L)    I, Michaelene Song, am acting as Location manager for [employer], PA-C I, Abby Potash, PA-C have reviewed above note and agree with its content

## 2018-08-03 ENCOUNTER — Ambulatory Visit (INDEPENDENT_AMBULATORY_CARE_PROVIDER_SITE_OTHER): Payer: BLUE CROSS/BLUE SHIELD | Admitting: Psychology

## 2018-08-03 ENCOUNTER — Other Ambulatory Visit: Payer: Self-pay

## 2018-08-03 DIAGNOSIS — F3289 Other specified depressive episodes: Secondary | ICD-10-CM | POA: Diagnosis not present

## 2018-08-03 NOTE — Progress Notes (Signed)
Office: [phone]  /  Fax: [phone]    Date: Aug 03, 2018    Appointment Start Time: 9:31am Duration: 32 minutes Provider: Glennie Isle, Psy.D. Type of Session: Individual Therapy  Location of Patient: Home Location of Provider: Provider's Home Type of Contact: Telepsychological Visit via [employer] WebEx   Session Content: Trudy is a 53 y.o. female presenting via [place] for a follow-up appointment to address the previously established treatment goal of decreasing emotional eating. Today's appointment was a telepsychological visit, as this provider's clinic is closed for in-person visits due to COVID-19. Therapeutic services will resume to in-person appointments once the clinic re-opens. Shamell expressed understanding regarding the rationale for telepsychological services, and provided verbal consent for today's appointment. Prior to proceeding with today's appointment, Julieann's physical location at the time of this appointment was obtained. Pammie reported she was at home and provided the address. In the event of technical difficulties, Amanat shared a phone number she could be reached at. Jonah and this provider participated in today's telepsychological service. Also, Rakeisha denied anyone else being present in the room or on the WebEx appointment.  This provider conducted a brief check-in and verbally administered the PHQ-9 and GAD-7. Since the last appointment, Linley reported, "I tried to do a few of the things on the sheet you gave me about the mindfulness." She expressed difficulty following the exercises at times; this was normalized. She further shared engaging in a mindfulness walk while in the hospital with her mother. Mahogany described the mindfulness exercises assisted in reducing anxiety as it relates to her mother's well-being. Regarding eating, Dari discussed fluctuations in appetite, which has resulted in her not eating congruent to the structured meal plan. This was explored further. During  her last appointment with Abby Potash, PA-C, options for protein intake were discussed; however, Takyla acknowledged experiencing difficulty eating the prescribed amount of protein. She was encouraged to start journaling again regardless of deviations from the structured meal plan; Lucita Ferrara agreed. Jajaira also shared feeling "drawn to" certain foods when she leaves her home; however, she discussed not eating while driving as previously discussed. To expand her mindfulness practice and assist with emotional eating, psychoeducation regarding the hunger and satisfaction scale was provided. Saory provided verbal consent during today's appointment for this provider to send the handout with the scale via e-mail. This provider recommended Kollyns continue to log her mindfulness practice, especially when she eats out and utilizes the hunger and satisfaction scale. Furthermore, this provider and Saundra discussed packing snacks when running errands to help reduce the likelihood of her stopping at fast food restaurants. Overall, Reality was receptive to today's session as evidenced by openness to sharing, responsiveness to feedback, and willingness to engage in discussed strategies. Due to continued deviations from the structured meal plan, this provider requested an appointment be scheduled in 2 weeks versus 3 weeks; Pranika was receptive.   Mental Status Examination:  Appearance: neat Behavior: cooperative Mood: euthymic Affect: mood congruent Speech: normal in rate, volume, and tone Eye Contact: appropriate Psychomotor Activity: appropriate Thought Process: linear, logical, and goal directed  Content/Perceptual Disturbances: denies suicidal and homicidal ideation, plan, and intent and no hallucinations, delusions, bizarre thinking or behavior reported or observed Orientation: time, person, place and purpose of appointment Cognition/Sensorium: memory, attention, language, and fund of knowledge intact  Insight: good Judgment:  good  Structured Assessment Results: The Patient Health Questionnaire-9 (PHQ-9) is a self-report measure that assesses symptoms and severity of depression over the course of the last two  weeks. Sarah obtained a score of 1 suggesting minimal depression. Jamile finds the endorsed symptoms to be not difficult at all. Decreased interest 0  Down, depressed, hopeless 0  Altered sleeping 1  Tired, decreased energy 0  Change in appetite 1  Feeling bad or failure about yourself 0  Trouble concentrating 0  Moving slowly or fidgety/restless 0  Suicidal thoughts 0  PHQ-9 Score 1    The Generalized Anxiety Disorder-7 (GAD-7) is a brief self-report measure that assesses symptoms of anxiety over the course of the last two weeks. Raynelle obtained a score of 0. Nervous, anxious, on edge 0  Control/stop worrying 0  Worrying too much- different things 0  Trouble relaxing 0  Restless 0  Easily annoyed or irritable 0  Afraid-awful might happen 0  GAD-7 Score 0   Interventions:  Conducted a brief chart review Verbal administration of PHQ-9 and GAD-7 for symptom monitoring Reviewed content from the previous session Engaged patient in problem solving Provided positive reinforcement Psychoeducation provided regarding the hunger and satisfaction scale Employed supportive psychotherapy interventions to facilitate reduced distress, and to improve coping skills with identified stressors  DSM-5 Diagnosis: 311 (F32.8) Other Specified Depressive Disorder, Emotional Eating Behaviors  Treatment Goal & Progress: During the initial appointment with this provider, the following treatment goal was established: decrease emotional eating. Nykira has demonstrated progress in her goal as evidenced by increased awareness of hunger patterns and triggers for emotional eating. Despite deviations from the structured meal plan, Anberlyn continues to demonstrate willingness to engage in learned skills.   Plan: Antasia continues to appear able  and willing to participate as evidenced by engagement in reciprocal conversation, and asking questions for clarification as appropriate. The next appointment will be scheduled in two weeks, which will be via [employer]. Once this provider's office resumes in-person appointments, [name] will be notified. The next session will focus further on mindfulness.

## 2018-08-07 ENCOUNTER — Encounter

## 2018-08-11 ENCOUNTER — Ambulatory Visit (INDEPENDENT_AMBULATORY_CARE_PROVIDER_SITE_OTHER): Payer: BC Managed Care – PPO | Admitting: Physician Assistant

## 2018-08-11 ENCOUNTER — Encounter (INDEPENDENT_AMBULATORY_CARE_PROVIDER_SITE_OTHER): Payer: Self-pay | Admitting: Physician Assistant

## 2018-08-11 ENCOUNTER — Other Ambulatory Visit: Payer: Self-pay

## 2018-08-11 DIAGNOSIS — E559 Vitamin D deficiency, unspecified: Secondary | ICD-10-CM | POA: Diagnosis not present

## 2018-08-11 DIAGNOSIS — Z6838 Body mass index (BMI) 38.0-38.9, adult: Secondary | ICD-10-CM

## 2018-08-11 DIAGNOSIS — E7849 Other hyperlipidemia: Secondary | ICD-10-CM | POA: Diagnosis not present

## 2018-08-11 MED ORDER — VITAMIN D (ERGOCALCIFEROL) 1.25 MG (50000 UNIT) PO CAPS: 50000.0000 [IU] | ORAL_CAPSULE | ORAL | 0 refills | Status: DC

## 2018-08-11 NOTE — Progress Notes (Signed)
Office: [phone]  /  Fax: [phone] TeleHealth Visit:  Cheryl Owens has verbally consented to this TeleHealth visit today. The patient is located at home, the provider is located at the [employer] [facility] office. The participants in this visit include the listed provider and patient. The visit was conducted today via Webex.  HPI:   Chief Complaint: OBESITY Cheryl Owens is here to discuss her progress with her obesity treatment plan. She is on the Category 3 plan and is following her eating plan approximately 50-60% of the time. She states she is walking a couple of miles 30 minutes 3-4 times per week. Ahlani reports that she is reaching her calories daily but is not reaching her protein goals. She is not meal planning. We were unable to weigh the patient today for this TeleHealth visit. She feels as if she has lost weight since her last visit. She has lost 7 lbs since starting treatment with [place].  Vitamin D deficiency Cheryl Owens has a diagnosis of Vitamin D deficiency. She is currently taking prescription Vit D and denies nausea, vomiting or muscle weakness.  Hyperlipidemia Cheryl Owens has hyperlipidemia and has been trying to improve her cholesterol levels with intensive lifestyle modification including a low saturated fat diet, exercise and weight loss. She is on atorvastatin and denies any chest pain.   ASSESSMENT AND PLAN:  Vitamin D deficiency - Plan: Vitamin D, Ergocalciferol, (DRISDOL) 1.25 MG (50000 UT) CAPS capsule  Other hyperlipidemia  Class 2 severe obesity with serious comorbidity and body mass index (BMI) of 38.0 to 38.9 in adult, unspecified obesity type (Cheryl Owens)  PLAN:  Vitamin D Deficiency Cheryl Owens was informed that low Vitamin D levels contributes to fatigue and are associated with obesity, breast, and colon cancer. She agrees to continue to take prescription Vit D @ 50,000 IU every week #4 with 0 refills and will follow-up for routine testing of Vitamin D, at least 2-3 times per  year. She was informed of the risk of over-replacement of Vitamin D and agrees to not increase her dose unless she discusses this with [place] first. Cris agrees to follow-up with our clinic in 2 weeks.  Hyperlipidemia Cheryl Owens was informed of the American Heart Association Guidelines emphasizing intensive lifestyle modifications as the first line treatment for hyperlipidemia. We discussed many lifestyle modifications today in depth, and Zi will continue to work on decreasing saturated fats such as fatty red meat, butter and many fried foods. She will continue atorvastatin, increase vegetables and lean protein in her diet, and continue to work on exercise and weight loss efforts.  Obesity Cheryl Owens is currently in the action stage of change. As such, her goal is to continue with weight loss efforts. She has agreed to follow the Category 3 plan or journal 1500 calories + 100 grams of protein daily. Cheryl Owens has been instructed to work up to a goal of 150 minutes of combined cardio and strengthening exercise per week for weight loss and overall health benefits. We discussed the following Behavioral Modification Strategies today: increasing lean protein intake, work on meal planning and easy cooking plans.  Cheryl Owens has agreed to follow-up with our clinic in 2 weeks. She was informed of the importance of frequent follow-up visits to maximize her success with intensive lifestyle modifications for her multiple health conditions.  ALLERGIES: Allergies  Allergen Reactions  . Hydrochlorothiazide     REACTION: hypokalemia  . Benazepril     Nausea& vomiting, abd cramps    MEDICATIONS: Current Outpatient Medications on File  Prior to Visit  Medication Sig Dispense Refill  . atorvastatin (LIPITOR) 20 MG tablet TAKE 1 TABLET DAILY 90 tablet 3  . buPROPion (WELLBUTRIN XL) 300 MG 24 hr tablet Take 1 tablet (300 mg total) by mouth every morning. 90 tablet 0  . clonazePAM (KLONOPIN) 0.5 MG tablet TAKE 1/2 TABLET IN THE  MORNING AND 2 AT BEDTIME 225 tablet 0  . escitalopram (LEXAPRO) 10 MG tablet TAKE 1 TABLET DAILY 90 tablet 3  . [name] 0.35 MG tablet TAKE 1 TABLET DAILY 84 tablet 0  . labetalol (NORMODYNE) 100 MG tablet Take 1 tablet (100 mg total) by mouth 2 (two) times daily. 180 tablet 3  . spironolactone (ALDACTONE) 25 MG tablet TAKE 1 TABLET TWICE A DAY 180 tablet 3   No current facility-administered medications on file prior to visit.     PAST MEDICAL HISTORY: Past Medical History:  Diagnosis Date  . Anxiety   . Depression   . Dry mouth   . Fibroids   . Gallbladder disease   . High cholesterol   . HTN (hypertension) 2007   seen in ER  with headaches  . Hypertension   . Hypokalemia 2007   due to HCTZ  . Nervousness   . Obesity   . Stress   . Swelling of lower extremity   . Trouble in sleeping     PAST SURGICAL HISTORY: Past Surgical History:  Procedure Laterality Date  . arm surgery     post trauma @ age 60  . BREAST SURGERY Right 2016   BREAST BX   . CHOLECYSTECTOMY     gall stones  . WISDOM TOOTH EXTRACTION      SOCIAL HISTORY: Social History   Tobacco Use  . Smoking status: Never Smoker  . Smokeless tobacco: Never Used  Substance Use Topics  . Alcohol use: No  . Drug use: No    FAMILY HISTORY: Family History  Problem Relation Age of Onset  . Cancer Maternal Grandmother        thyroid  . Hypertension Maternal Grandmother   . Hyperlipidemia Mother   . Hypertension Mother   . Depression Mother   . Anxiety disorder Mother   . Obesity Mother   . Breast cancer Maternal Aunt   . Kidney disease Maternal Aunt         X 2; 1 on Dialysis  . Cancer Maternal Aunt        ? primary; also had HTN  . Prostate cancer Paternal Uncle          3 uncles  . Prostate cancer Father   . Cancer Father   . Hypertension Paternal Grandmother   . Hypertension Maternal Aunt        X 2  . Bipolar disorder Paternal Aunt   . Stroke Neg Hx   . Diabetes Neg Hx   . Heart disease Neg  Hx   . Colon cancer Neg Hx    ROS: Review of Systems  Cardiovascular: Negative for chest pain.  Gastrointestinal: Negative for nausea and vomiting.  Musculoskeletal:       Negative for muscle weakness.   PHYSICAL EXAM: Pt in no acute distress  RECENT LABS AND TESTS: BMET    Component Value Date/Time   NA 142 05/05/2018 1252   K 4.7 05/05/2018 1252   CL 105 05/05/2018 1252   CO2 21 05/05/2018 1252   GLUCOSE 85 05/05/2018 1252   GLUCOSE 87 06/08/2017 1343   BUN 12 05/05/2018 1252  CREATININE 1.12 (H) 05/05/2018 1252   CREATININE 1.28 (H) 02/19/2017 0851   CALCIUM 9.7 05/05/2018 1252   GFRNONAA 57 (L) 05/05/2018 1252   GFRAA 65 05/05/2018 1252   Lab Results  Component Value Date   HGBA1C 5.9 (H) 05/05/2018   HGBA1C 5.4 04/19/2015   HGBA1C 5.7 02/15/2013   HGBA1C 5.4 12/03/2010   HGBA1C 5.3 09/28/2008   Lab Results  Component Value Date   INSULIN 18.3 05/05/2018   CBC    Component Value Date/Time   WBC 8.0 05/05/2018 1252   WBC 7.5 11/02/2017 0839   RBC 4.72 05/05/2018 1252   RBC 4.54 11/02/2017 0839   HGB 12.8 05/05/2018 1252   HCT 40.4 05/05/2018 1252   PLT 293 11/02/2017 0839   MCV 86 05/05/2018 1252   MCH 27.1 05/05/2018 1252   MCH 27.5 11/02/2017 0839   MCHC 31.7 05/05/2018 1252   MCHC 31.9 (L) 11/02/2017 0839   RDW 13.6 05/05/2018 1252   LYMPHSABS 2.1 05/05/2018 1252   MONOABS 0.6 06/08/2017 1343   EOSABS 0.1 05/05/2018 1252   BASOSABS 0.0 05/05/2018 1252   Iron/TIBC/Ferritin/ %Sat No results found for: IRON, TIBC, FERRITIN, IRONPCTSAT Lipid Panel     Component Value Date/Time   CHOL 160 05/05/2018 1252   TRIG 65 05/05/2018 1252   HDL 54 05/05/2018 1252   CHOLHDL 3 06/08/2017 1343   VLDL 12.6 06/08/2017 1343   LDLCALC 93 05/05/2018 1252   LDLCALC 129 (H) 02/19/2017 0851   Hepatic Function Panel     Component Value Date/Time   PROT 7.0 05/05/2018 1252   ALBUMIN 4.1 05/05/2018 1252   AST 32 05/05/2018 1252   ALT 44 (H) 05/05/2018 1252    ALKPHOS 130 (H) 05/05/2018 1252   BILITOT 0.4 05/05/2018 1252   BILIDIR 0.1 04/20/2014 0804      Component Value Date/Time   TSH 1.250 05/05/2018 1252   TSH 1.94 06/08/2017 1343   TSH 1.70 02/19/2017 0851   Results for TAYTE, CHILDERS "MICHELE" (MRN [id number]) as of 08/11/2018 16:38  Ref. Range 05/05/2018 12:52  Vitamin D, 25-Hydroxy Latest Ref Range: 30.0 - 100.0 ng/mL 15.5 (L)    I, Michaelene Song, am acting as Location manager for [employer], PA-C I, Abby Potash, PA-C have reviewed above note and agree with its content

## 2018-08-17 ENCOUNTER — Encounter (INDEPENDENT_AMBULATORY_CARE_PROVIDER_SITE_OTHER): Payer: Self-pay

## 2018-08-17 ENCOUNTER — Telehealth (INDEPENDENT_AMBULATORY_CARE_PROVIDER_SITE_OTHER): Payer: Self-pay | Admitting: Psychology

## 2018-08-17 ENCOUNTER — Ambulatory Visit (INDEPENDENT_AMBULATORY_CARE_PROVIDER_SITE_OTHER): Payer: Self-pay | Admitting: Psychology

## 2018-08-17 NOTE — Telephone Encounter (Signed)
  Office: [phone]  /  Fax: [phone]  Date of Call: August 17, 2018  Time of Call: 10:02am Provider: Glennie Isle, PsyD  CONTENT: This provider called Cheryl Owens to check-in as she did not present for today's Webex appointment at 10:00am. A HIPAA compliant voicemail was left requesting a call back. Of note, this provider stayed on the [facility] appointment for 10 minutes prior to signing off.   PLAN: This provider will wait for [name] to call back. If deemed necessary, this provider or the provider's clinic will call [name] again in approximately one week.

## 2018-08-17 NOTE — Progress Notes (Unsigned)
  Office: [phone]  /  Fax: [phone]    Date: August 17, 2018    Appointment Start Time:*** Duration:*** Provider: Glennie Isle, Psy.D. Type of Session: Individual Therapy  Location of Patient: *** Location of Provider: Provider's Home Type of Contact: Telepsychological Visit via [employer] Web[employer]   Session Content: Cheryl Owens is a 53 y.o. female presenting via [employer] for a follow-up appointment to address the previously established treatment goal of decreasing emotional eating. Today's appointment was a telepsychological visit, as this provider's clinic is seeing a limited number of patients for in-person visits due to COVID-19. Therapeutic services will resume to in-person appointments once deemed appropriate. [name] expressed understanding regarding the rationale for telepsychological services, and provided verbal consent for today's appointment. Prior to proceeding with today's appointment, [name]'s physical location at the time of this appointment was obtained. [name] reported she was at *** and provided the address. In the event of technical difficulties, Kawana shared a phone number she could be reached at. Berenize and this provider participated in today's telepsychological service. Also, [name] denied anyone else being present in the room or on the WebEx appointment ***.  This provider conducted a brief check-in and verbally administered the PHQ-9 and GAD-7. ***   [name] was receptive to today's session as evidenced by openness to sharing, responsiveness to feedback, and ***.  Mental Status Examination:  Appearance: {Appearance:22431} Behavior: {Behavior:22445} Mood: {Teletherapy mood:22435} Affect: {Affect:22436} Speech: {Speech:22432} Eye Contact: {Eye Contact:22433} Psychomotor Activity: {Motor Activity:22434} Thought Process: {thought process:22448}  Content/Perceptual Disturbances: {disturbances:22451} Orientation: {Orientation:22437} Cognition/Sensorium: {gbcognition:22449} Insight:  {Insight:22446} Judgment: {Insight:22446}  Structured Assessment Results: The Patient Health Questionnaire-9 (PHQ-9) is a self-report measure that assesses symptoms and severity of depression over the course of the last two weeks. [name] obtained a score of *** suggesting {GBPHQ9SEVERITY:21752}. [name] finds the endorsed symptoms to be {gbphq9difficulty:21754}. Decreased interest ***  Down, depressed, hopeless ***  Altered sleeping ***  Tired, decreased energy ***  Change in appetite ***  Feeling bad or failure about yourself ***  Trouble concentrating ***  Moving slowly or fidgety/restless ***  Suicidal thoughts ***  PHQ-9 Score ***    The Generalized Anxiety Disorder-7 (GAD-7) is a brief self-report measure that assesses symptoms of anxiety over the course of the last two weeks. Brystol obtained a score of *** suggesting {gbgad7severity:21753}. [name] finds the endorsed symptoms to be {gbphq9difficulty:21754}. Nervous, anxious, on edge ***  Control/stop worrying ***  Worrying too much- different things ***  Trouble relaxing ***  Restless ***  Easily annoyed or irritable ***  Afraid-awful might happen ***  GAD-7 Score ***   Interventions:  {Interventions:22172}  DSM-5 Diagnosis: 311 (F32.8) Other Specified Depressive Disorder, Emotional Eating Behaviors  Treatment Goal & Progress: During the initial appointment with this provider, the following treatment goal was established: decrease emotional eating. [name] has demonstrated progress in her goal as evidenced by ***  Plan: [name] continues to appear able and willing to participate as evidenced by engagement in reciprocal conversation, and asking questions for clarification as appropriate. The next appointment will be scheduled in {gbweeks:21758}, which will be via [employer]. Once this provider's office resumes in-person appointments and it is deemed appropriate, [name] will be notified. The next session will focus on reviewing learned skills, and  working towards the established treatment goal.***

## 2018-08-19 ENCOUNTER — Other Ambulatory Visit: Payer: Self-pay

## 2018-08-20 ENCOUNTER — Ambulatory Visit: Payer: BC Managed Care – PPO | Admitting: Obstetrics & Gynecology

## 2018-08-20 ENCOUNTER — Encounter: Payer: Self-pay | Admitting: Obstetrics & Gynecology

## 2018-08-20 VITALS — BP 110/80 | Ht 67.0 in | Wt 233.0 lb

## 2018-08-20 DIAGNOSIS — D219 Benign neoplasm of connective and other soft tissue, unspecified: Secondary | ICD-10-CM | POA: Diagnosis not present

## 2018-08-20 DIAGNOSIS — Z01419 Encounter for gynecological examination (general) (routine) without abnormal findings: Secondary | ICD-10-CM

## 2018-08-20 DIAGNOSIS — R87610 Atypical squamous cells of undetermined significance on cytologic smear of cervix (ASC-US): Secondary | ICD-10-CM | POA: Diagnosis not present

## 2018-08-20 DIAGNOSIS — E669 Obesity, unspecified: Secondary | ICD-10-CM | POA: Diagnosis not present

## 2018-08-20 DIAGNOSIS — Z3041 Encounter for surveillance of contraceptive pills: Secondary | ICD-10-CM | POA: Diagnosis not present

## 2018-08-20 NOTE — Progress Notes (Signed)
Cheryl Owens 08-07-1965 [id number]   History:    53 y.o. G4P1A3L1 Single  RP:  Established patient presenting for annual gyn exam   HPI: DepoLupron 11/2017 and 03/2018.  On the Progestin-only pill.  No menses x 11/2017.  Feeling of fullness, pressure, but no pain.  Abstinent.  Urine/BMs normal.  Breasts normal.  BMI 36.49.  Lost 49 Lbs recently on [facility] weight loss program.  Past medical history,surgical history, family history and social history were all reviewed and documented in the EPIC chart.  Gynecologic History No LMP recorded (lmp unknown). Contraception: abstinence and oral progesterone-only contraceptive Last Pap: 03/2017. Results were: ASCUS/HPV HR neg. Last mammogram: 03/2017 Results were: normal Bone Density: Never Colonoscopy: 03/2016  Obstetric History OB History  Gravida Para Term Preterm AB Living  4 1     3 1   SAB TAB Ectopic Multiple Live Births               # Outcome Date GA Lbr Len/2nd Weight Sex Delivery Anes PTL Lv  4 AB           3 AB           2 AB           1 Para              ROS: A ROS was performed and pertinent positives and negatives are included in the history.  GENERAL: No fevers or chills. HEENT: No change in vision, no earache, sore throat or sinus congestion. NECK: No pain or stiffness. CARDIOVASCULAR: No chest pain or pressure. No palpitations. PULMONARY: No shortness of breath, cough or wheeze. GASTROINTESTINAL: No abdominal pain, nausea, vomiting or diarrhea, melena or bright red blood per rectum. GENITOURINARY: No urinary frequency, urgency, hesitancy or dysuria. MUSCULOSKELETAL: No joint or muscle pain, no back pain, no recent trauma. DERMATOLOGIC: No rash, no itching, no lesions. ENDOCRINE: No polyuria, polydipsia, no heat or cold intolerance. No recent change in weight. HEMATOLOGICAL: No anemia or easy bruising or bleeding. NEUROLOGIC: No headache, seizures, numbness, tingling or weakness. PSYCHIATRIC: No depression, no loss of interest in  normal activity or change in sleep pattern.     Exam:   BP 110/80 (BP Location: Right Arm, Patient Position: Sitting, Cuff Size: Large)   Ht 5\' 7"  (1.702 m)   Wt 233 lb (105.7 kg)   LMP  (LMP Unknown)   BMI 36.49 kg/m   Body mass index is 36.49 kg/m.  General appearance : Well developed well nourished female. No acute distress HEENT: Eyes: no retinal hemorrhage or exudates,  Neck supple, trachea midline, no carotid bruits, no thyroidmegaly Lungs: Clear to auscultation, no rhonchi or wheezes, or rib retractions  Heart: Regular rate and rhythm, no murmurs or gallops Breast:Examined in sitting and supine position were symmetrical in appearance, no palpable masses or tenderness,  no skin retraction, no nipple inversion, no nipple discharge, no skin discoloration, no axillary or supraclavicular lymphadenopathy Abdomen: no palpable masses or tenderness, no rebound or guarding Extremities: no edema or skin discoloration or tenderness  Pelvic: Vulva: Normal             Vagina: No gross lesions or discharge  Cervix: No gross lesions or discharge.  Pap reflex done.  Uterus  Enlarged to just below the umbilicus with Fibroids, NT, mobile  Adnexa  Without masses or tenderness  Anus: Normal   Assessment/Plan:  53 y.o. female for annual exam   1. Encounter for routine gynecological examination  with Papanicolaou smear of cervix Gynecologic exam with large uterine fibroids.  Last year's Pap test showed ASCUS with negative high-risk HPV, Pap reflex done today.  Breast exam normal.  Health labs with family physician.  2. Fibroids Large fibroids but minimally symptomatic currently on the progestin only pill.  Had Depo-Lupron 27-month dose x2 in September 2019 and January 2020.  No menstrual period since September 2019.  Sensation of fullness in the lower abdomen but no pain.  We will continue to observe on the progestin only pill.  3. Encounter for surveillance of contraceptive pills Well on the  progestin only pill.  No contraindication to continue.  Represcribed.  4. Obesity (BMI 30-39.9) Followed by the [facility].  Losing weight on a nutrition plan and fitness program.  Encouraged to continue.  Princess Bruins MD, 2:26 PM 08/20/2018

## 2018-08-23 ENCOUNTER — Ambulatory Visit (INDEPENDENT_AMBULATORY_CARE_PROVIDER_SITE_OTHER): Payer: BC Managed Care – PPO | Admitting: Family Medicine

## 2018-08-23 ENCOUNTER — Encounter (INDEPENDENT_AMBULATORY_CARE_PROVIDER_SITE_OTHER): Payer: Self-pay | Admitting: Family Medicine

## 2018-08-23 ENCOUNTER — Other Ambulatory Visit: Payer: Self-pay | Admitting: Family Medicine

## 2018-08-23 ENCOUNTER — Other Ambulatory Visit: Payer: Self-pay | Admitting: Obstetrics & Gynecology

## 2018-08-23 ENCOUNTER — Other Ambulatory Visit: Payer: Self-pay

## 2018-08-23 DIAGNOSIS — R7303 Prediabetes: Secondary | ICD-10-CM

## 2018-08-23 DIAGNOSIS — Z6838 Body mass index (BMI) 38.0-38.9, adult: Secondary | ICD-10-CM | POA: Diagnosis not present

## 2018-08-23 DIAGNOSIS — E559 Vitamin D deficiency, unspecified: Secondary | ICD-10-CM

## 2018-08-23 DIAGNOSIS — Z1231 Encounter for screening mammogram for malignant neoplasm of breast: Secondary | ICD-10-CM

## 2018-08-24 LAB — PAP IG W/ RFLX HPV ASCU

## 2018-08-24 LAB — HUMAN PAPILLOMAVIRUS, HIGH RISK: HPV DNA High Risk: NOT DETECTED

## 2018-08-24 MED ORDER — VITAMIN D (ERGOCALCIFEROL) 1.25 MG (50000 UNIT) PO CAPS: 50000.0000 [IU] | ORAL_CAPSULE | ORAL | 0 refills | Status: DC

## 2018-08-24 NOTE — Progress Notes (Signed)
Office: [phone]  /  Fax: [phone] TeleHealth Visit:  Cheryl Owens has verbally consented to this TeleHealth visit today. The patient is located at home, the provider is located at the [employer] [facility] office. The participants in this visit include the listed provider and patient. The visit was conducted today via webex.  HPI:   Chief Complaint: OBESITY Cheryl Owens is here to discuss her progress with her obesity treatment plan. She is on the keep a food journal with 1500 calories and 100+ grams of protein daily or follow the Category 3 plan and is following her eating plan approximately 50-60 % of the time. She states she is walking 2-3 miles 4 times per week. Cheryl Owens voices she is struggling with eating out and voices she thinks its more of a craving than due to actual hunger. Her weight is of 228 lbs. She voices she has to set alarms for snacks and can sometimes miss out on portions of meals.  We were unable to weigh the patient today for this TeleHealth visit. She feels as if she has maintained her weight since her last visit. She has lost 7 lbs since starting treatment with [place].  Vitamin D Deficiency Cheryl Owens has a diagnosis of vitamin D deficiency. She is currently taking prescription Vit D. She notes fatigue and denies nausea, vomiting or muscle weakness.  Pre-Diabetes Cheryl Owens has a diagnosis of pre-diabetes based on her elevated Hgb A1c of 5.9 and insulin of 18.3. She was informed this puts her at greater risk of developing diabetes. She is not taking metformin currently and continues to work on diet and exercise to decrease risk of diabetes. She denies nausea or hypoglycemia.  ASSESSMENT AND PLAN:  Vitamin D deficiency - Plan: Vitamin D, Ergocalciferol, (DRISDOL) 1.25 MG (50000 UT) CAPS capsule  Prediabetes  Class 2 severe obesity with serious comorbidity and body mass index (BMI) of 38.0 to 38.9 in adult, unspecified obesity type ([place])  PLAN:  Vitamin D Deficiency Cheryl Owens was  informed that low vitamin D levels contributes to fatigue and are associated with obesity, breast, and colon cancer. Arlee agrees to continue taking prescription Vit D 50,000 IU every week #4 and we will refill for 1 month. She will follow up for routine testing of vitamin D, at least 2-3 times per year. She was informed of the risk of over-replacement of vitamin D and agrees to not increase her dose unless she discusses this with [place] first. Cheryl Owens agrees to follow up with our clinic in 2 weeks.  Pre-Diabetes Cheryl Owens will continue to work on weight loss, exercise, and decreasing simple carbohydrates in her diet to help decrease the risk of diabetes. We dicussed metformin including benefits and risks. She was informed that eating too many simple carbohydrates or too many calories at one sitting increases the likelihood of GI side effects. Cheryl Owens declined metformin for now and a prescription was not written today. We will repeat labs at her first in person appointment. Cheryl Owens agrees to follow up with our clinic in 2 weeks as directed to monitor her progress.  Obesity Cheryl Owens is currently in the action stage of change. As such, her goal is to continue with weight loss efforts She has agreed to follow the Category 3 plan Cheryl Owens has been instructed to work up to a goal of 150 minutes of combined cardio and strengthening exercise per week for weight loss and overall health benefits. We discussed the following Behavioral Modification Strategies today: increasing lean protein intake, increasing vegetables  and work on meal planning and easy cooking plans, keeping healthy foods in the home, better snacking choices, and planning for success   Cheryl Owens has agreed to follow up with our clinic in 2 weeks. She was informed of the importance of frequent follow up visits to maximize her success with intensive lifestyle modifications for her multiple health conditions.  ALLERGIES: Allergies  Allergen Reactions  . Hydrochlorothiazide      REACTION: hypokalemia  . Benazepril     Nausea& vomiting, abd cramps    MEDICATIONS: Current Outpatient Medications on File Prior to Visit  Medication Sig Dispense Refill  . atorvastatin (LIPITOR) 20 MG tablet TAKE 1 TABLET DAILY 90 tablet 3  . buPROPion (WELLBUTRIN XL) 300 MG 24 hr tablet Take 1 tablet (300 mg total) by mouth every morning. 90 tablet 0  . clonazePAM (KLONOPIN) 0.5 MG tablet TAKE 1/2 TABLET IN THE MORNING AND 2 AT BEDTIME 225 tablet 0  . escitalopram (LEXAPRO) 10 MG tablet TAKE 1 TABLET DAILY 90 tablet 3  . [name] 0.35 MG tablet TAKE 1 TABLET DAILY 84 tablet 0  . labetalol (NORMODYNE) 100 MG tablet Take 1 tablet (100 mg total) by mouth 2 (two) times daily. 180 tablet 3  . spironolactone (ALDACTONE) 25 MG tablet TAKE 1 TABLET TWICE A DAY 180 tablet 3  . Vitamin D, Ergocalciferol, (DRISDOL) 1.25 MG (50000 UT) CAPS capsule Take 1 capsule (50,000 Units total) by mouth every 7 (seven) days. 4 capsule 0   No current facility-administered medications on file prior to visit.     PAST MEDICAL HISTORY: Past Medical History:  Diagnosis Date  . Anxiety   . Depression   . Dry mouth   . Fibroids   . Gallbladder disease   . High cholesterol   . HTN (hypertension) 2007   seen in ER  with headaches  . Hypertension   . Hypokalemia 2007   due to HCTZ  . Nervousness   . Obesity   . Stress   . Swelling of lower extremity   . Trouble in sleeping     PAST SURGICAL HISTORY: Past Surgical History:  Procedure Laterality Date  . arm surgery     post trauma @ age 24  . BREAST SURGERY Right 2016   BREAST BX   . CHOLECYSTECTOMY     gall stones  . WISDOM TOOTH EXTRACTION      SOCIAL HISTORY: Social History   Tobacco Use  . Smoking status: Never Smoker  . Smokeless tobacco: Never Used  Substance Use Topics  . Alcohol use: No  . Drug use: No    FAMILY HISTORY: Family History  Problem Relation Age of Onset  . Cancer Maternal Grandmother        thyroid  .  Hypertension Maternal Grandmother   . Hyperlipidemia Mother   . Hypertension Mother   . Depression Mother   . Anxiety disorder Mother   . Obesity Mother   . Breast cancer Maternal Aunt   . Kidney disease Maternal Aunt         X 2; 1 on Dialysis  . Cancer Maternal Aunt        ? primary; also had HTN  . Prostate cancer Paternal Uncle          3 uncles  . Prostate cancer Father   . Cancer Father   . Hypertension Paternal Grandmother   . Hypertension Maternal Aunt        X 2  . Bipolar disorder Paternal  Aunt   . Stroke Neg Hx   . Diabetes Neg Hx   . Heart disease Neg Hx   . Colon cancer Neg Hx     ROS: Review of Systems  Constitutional: Positive for malaise/fatigue. Negative for weight loss.  Gastrointestinal: Negative for nausea and vomiting.  Musculoskeletal:       Negative muscle weakness  Endo/Heme/Allergies:       Negative hypoglycemia    PHYSICAL EXAM: Pt in no acute distress  RECENT LABS AND TESTS: BMET    Component Value Date/Time   NA 142 05/05/2018 1252   K 4.7 05/05/2018 1252   CL 105 05/05/2018 1252   CO2 21 05/05/2018 1252   GLUCOSE 85 05/05/2018 1252   GLUCOSE 87 06/08/2017 1343   BUN 12 05/05/2018 1252   CREATININE 1.12 (H) 05/05/2018 1252   CREATININE 1.28 (H) 02/19/2017 0851   CALCIUM 9.7 05/05/2018 1252   GFRNONAA 57 (L) 05/05/2018 1252   GFRAA 65 05/05/2018 1252   Lab Results  Component Value Date   HGBA1C 5.9 (H) 05/05/2018   HGBA1C 5.4 04/19/2015   HGBA1C 5.7 02/15/2013   HGBA1C 5.4 12/03/2010   HGBA1C 5.3 09/28/2008   Lab Results  Component Value Date   INSULIN 18.3 05/05/2018   CBC    Component Value Date/Time   WBC 8.0 05/05/2018 1252   WBC 7.5 11/02/2017 0839   RBC 4.72 05/05/2018 1252   RBC 4.54 11/02/2017 0839   HGB 12.8 05/05/2018 1252   HCT 40.4 05/05/2018 1252   PLT 293 11/02/2017 0839   MCV 86 05/05/2018 1252   MCH 27.1 05/05/2018 1252   MCH 27.5 11/02/2017 0839   MCHC 31.7 05/05/2018 1252   MCHC 31.9 (L)  11/02/2017 0839   RDW 13.6 05/05/2018 1252   LYMPHSABS 2.1 05/05/2018 1252   MONOABS 0.6 06/08/2017 1343   EOSABS 0.1 05/05/2018 1252   BASOSABS 0.0 05/05/2018 1252   Iron/TIBC/Ferritin/ %Sat No results found for: IRON, TIBC, FERRITIN, IRONPCTSAT Lipid Panel     Component Value Date/Time   CHOL 160 05/05/2018 1252   TRIG 65 05/05/2018 1252   HDL 54 05/05/2018 1252   CHOLHDL 3 06/08/2017 1343   VLDL 12.6 06/08/2017 1343   LDLCALC 93 05/05/2018 1252   LDLCALC 129 (H) 02/19/2017 0851   Hepatic Function Panel     Component Value Date/Time   PROT 7.0 05/05/2018 1252   ALBUMIN 4.1 05/05/2018 1252   AST 32 05/05/2018 1252   ALT 44 (H) 05/05/2018 1252   ALKPHOS 130 (H) 05/05/2018 1252   BILITOT 0.4 05/05/2018 1252   BILIDIR 0.1 04/20/2014 0804      Component Value Date/Time   TSH 1.250 05/05/2018 1252   TSH 1.94 06/08/2017 1343   TSH 1.70 02/19/2017 0851      I, Trixie Dredge, am acting as transcriptionist for Ilene Qua, MD  I have reviewed the above documentation for accuracy and completeness, and I agree with the above. - Ilene Qua, MD

## 2018-08-25 ENCOUNTER — Other Ambulatory Visit: Payer: Self-pay

## 2018-08-25 ENCOUNTER — Ambulatory Visit (INDEPENDENT_AMBULATORY_CARE_PROVIDER_SITE_OTHER): Payer: BC Managed Care – PPO | Admitting: Psychology

## 2018-08-25 DIAGNOSIS — F3289 Other specified depressive episodes: Secondary | ICD-10-CM

## 2018-08-25 NOTE — Progress Notes (Signed)
Office: [phone]  /  Fax: [phone]    Date: August 25, 2018    Appointment Start Time: 3:03pm Duration: 27 minutes Provider: Glennie Isle, Psy.D. Type of Session: Individual Therapy  Location of Patient: Home Location of Provider: Provider's Home Type of Contact: Telepsychological Visit via [employer] Web[employer]   Session Content: Cheryl Owens is a 53 y.o. female presenting via [place] for a follow-up appointment to address the previously established treatment goal of decreasing emotional eating. Of note, this provider called Cheryl Owens at 3:01pm, and she indicated a belief that someone was going to call her; therefore, she did not join today's Webex appointment. Instructions were provided. As such, today's appointment was initiated 3 minutes late. Today's appointment was a telepsychological visit, as this provider's clinic is seeing a limited number of patients for in-person visits due to COVID-19. Therapeutic services will resume to in-person appointments once deemed appropriate. Cheryl Owens expressed understanding regarding the rationale for telepsychological services, and provided verbal consent for today's appointment. Prior to proceeding with today's appointment, Cheryl Owens's physical location at the time of this appointment was obtained. Cheryl Owens reported she was at home and provided the address. In the event of technical difficulties, Cheryl Owens shared a phone number she could be reached at. Cheryl Owens and this provider participated in today's telepsychological service. Also, Cheryl Owens denied anyone else being present in the room or on the WebEx appointment.  This provider conducted a brief check-in and verbally administered the PHQ-9 and GAD-7. Cheryl Owens reported, "I'm not sure how I missed our last appointment." She apologized for missing the appointment. Cheryl Owens reported she met with Dr. Adair Patter on Monday and an "exercise component" was added, which includes walking daily. She acknowledged experiencing difficulty not eating while on the go.  Thus, she was encouraged to engage in the aforementioned when possible. Additionally, protein intake was discussed with Dr. Adair Patter, including strategies. Notably, Cheryl Owens shared, "I'm a little frustrated." This was explored further. Cheryl Owens reported she is stressed secondary to current events and worry regarding job security. As such, she feels she is not concentrating on what she "should be eating." Cheryl Owens also described disappointment in reaching her weight loss goal. Thus, she was engaged in thought challenging and reframing by exploring evidence for and against her concerns. Psychoeducation regarding all or nothing thinking was provided. Notably, Cheryl Owens reported she is scheduled to meet with her psychiatrist tomorrow for medication management.   Remainder of today's appointment focused further on mindfulness. Mindfulness was reviewed and [name] indicated she has engaged in mindful walking; this was positively reinforced. This provider discussed the utilization of the five senses exercise to interrupt unhelpful thought processes, which may also reduce subsequent emotional eating. She expressed uncertainty about how the exercise can help. Thus, she was led through the exercise during today's appointment and she acknowledged that the exercise assisted in grounding her in the present moment. Overall, Cheryl Owens was receptive to today's session as evidenced by openness to sharing, responsiveness to feedback, and willingness to engage in the mindfulness exercises provided.  Mental Status Examination:  Appearance: neat Behavior: cooperative Mood: euthymic Affect: mood congruent Speech: normal in rate, volume, and tone Eye Contact: appropriate Psychomotor Activity: appropriate Thought Process: linear, logical, and goal directed  Content/Perceptual Disturbances: denies suicidal and homicidal ideation, plan, and intent and no hallucinations, delusions, bizarre thinking or behavior reported or observed Orientation: time,  person, place and purpose of appointment Cognition/Sensorium: memory, attention, language, and fund of knowledge intact  Insight: good Judgment: good  Structured Assessment Results: The Patient Health Questionnaire-9 (  PHQ-9) is a self-report measure that assesses symptoms and severity of depression over the course of the last two weeks. Cheryl Owens obtained a score of 6 suggesting mild depression. Cheryl Owens finds the endorsed symptoms to be not difficult at all. Little interest or pleasure in doing things 1  Feeling down, depressed, or hopeless 1  Trouble falling or staying asleep, or sleeping too much 1  Feeling tired or having little energy 1  Poor appetite or overeating 1  Feeling bad about yourself --- or that you are a failure or have let yourself or your family down 1  Trouble concentrating on things, such as reading the newspaper or watching television 0  Moving or speaking so slowly that other people could have noticed? Or the opposite --- being so fidgety or restless that you have been moving around a lot more than usual 0  Thoughts that you would be better off dead or hurting yourself in some way 0  PHQ-9 Score 6    The Generalized Anxiety Disorder-7 (GAD-7) is a brief self-report measure that assesses symptoms of anxiety over the course of the last two weeks. Cheryl Owens obtained a score of 6 suggesting mild anxiety. Cheryl Owens finds the endorsed symptoms to be somewhat difficult. Feeling nervous, anxious, on edge 1  Not being able to stop or control worrying 1  Worrying too much about different things 1  Trouble relaxing 1  Being so restless that it's hard to sit still 0  Becoming easily annoyed or irritable 1  Feeling afraid as if something awful might happen 1  GAD-7 Score 6   Interventions:  Conducted a brief chart review Verbal administration of PHQ-9 and GAD-7 for symptom monitoring Provided empathic reflections and validation Reviewed content from the previous session Provided positive  reinforcement Psychoeducation provided regarding all-or-nothing thinking Engaged patient in thought challenging and reframing Engaged patient in a mindfulness exercise  DSM-5 Diagnosis: 311 (F32.8) Other Specified Depressive Disorder, Emotional Eating Behaviors  Treatment Goal & Progress: During the initial appointment with this provider, the following treatment goal was established: decrease emotional eating. Cheryl Owens has demonstrated progress in her goal as evidenced by increased awareness of hunger patterns and triggers for emotional eating. Despite Cheryl Owens expressing difficulty with the structured meal plan, she continues to demonstrate willingness to discuss obstacles and engage in learned skills to assist with coping.   Plan: Cheryl Owens continues to appear able and willing to participate as evidenced by engagement in reciprocal conversation, and asking questions for clarification as appropriate. Due to the upcoming holiday and Cheryl Owens going on vacation, the next appointment will be scheduled in three weeks, which will be via [employer]. Once this provider's office resumes in-person appointments and it is deemed appropriate, Cheryl Owens will be notified. The next session will focus further on mindfulness.

## 2018-08-26 ENCOUNTER — Other Ambulatory Visit: Payer: Self-pay

## 2018-08-26 ENCOUNTER — Ambulatory Visit (INDEPENDENT_AMBULATORY_CARE_PROVIDER_SITE_OTHER): Payer: BC Managed Care – PPO | Admitting: Psychiatry

## 2018-08-26 ENCOUNTER — Encounter: Payer: Self-pay | Admitting: Psychiatry

## 2018-08-26 DIAGNOSIS — F4001 Agoraphobia with panic disorder: Secondary | ICD-10-CM

## 2018-08-26 DIAGNOSIS — F5105 Insomnia due to other mental disorder: Secondary | ICD-10-CM

## 2018-08-26 DIAGNOSIS — F411 Generalized anxiety disorder: Secondary | ICD-10-CM

## 2018-08-26 DIAGNOSIS — F3342 Major depressive disorder, recurrent, in full remission: Secondary | ICD-10-CM | POA: Diagnosis not present

## 2018-08-26 NOTE — Progress Notes (Signed)
Cheryl Owens [id number] 11/15/1965 53 y.o.  Subjective:   Patient ID:  Cheryl Owens is a 53 y.o. (DOB January 23, 1966) female.  Chief Complaint:  Chief Complaint  Patient presents with  . Depression    med mangement  . Anxiety    HPI Cheryl Owens presents to the office today for follow-up of depression and anxiety and history of insomnia and Bz withdrawal.   Last seen in November.  No meds were changed.  I guess OK.  Didn't get to go [place] trip bc Covid.  A little worried about Covid.    Working from home 3 mos.  Likes it.  Better with weight control.  Doing Healthy Weight and Wellness including doctor including mental health provider and counseling to help weight loss.  Started early April.  Lost 20+ pounds.  Social unrest is more anxious interfering with weight loss.  Hoping for vacation soon.     Work life balance is better overall but Covid reduced work volume and pay rate.  Layoffs yesterday but she feels pretty safe.  Patient reports stable mood and denies depressed or irritable moods.  Patient denies any recent difficulty with anxiety.  Patient denies difficulty with sleep initiation or maintenance. 6 hours; hard to let go of phone.  Denies appetite disturbance.  Patient reports that energy and motivation have been good.  Patient denies any difficulty with concentration.  Patient denies any suicidal ideation. No panic.  Hx noncompliance bc forgets.  Is using a pill box, and is aware if she forgets it.  Fearful to decrease meds.  Past Psychiatric Medication Trials:  Wellbutrin, clonazepam,  Adderall, Lexapro  Review of Systems:  Review of Systems  Neurological: Negative for tremors and weakness.  Psychiatric/Behavioral: Negative for agitation, behavioral problems, confusion, decreased concentration, dysphoric mood, hallucinations, self-injury, sleep disturbance and suicidal ideas. The patient is not nervous/anxious and is not hyperactive.     Medications: I have reviewed  the patient's current medications.  Current Outpatient Medications  Medication Sig Dispense Refill  . atorvastatin (LIPITOR) 20 MG tablet TAKE 1 TABLET DAILY 90 tablet 3  . buPROPion (WELLBUTRIN XL) 300 MG 24 hr tablet Take 1 tablet (300 mg total) by mouth every morning. 90 tablet 0  . clonazePAM (KLONOPIN) 0.5 MG tablet TAKE 1/2 TABLET IN THE MORNING AND 2 AT BEDTIME 225 tablet 0  . escitalopram (LEXAPRO) 10 MG tablet TAKE 1 TABLET DAILY 90 tablet 3  . [name] 0.35 MG tablet TAKE 1 TABLET DAILY 84 tablet 0  . labetalol (NORMODYNE) 100 MG tablet Take 1 tablet (100 mg total) by mouth 2 (two) times daily. 180 tablet 3  . spironolactone (ALDACTONE) 25 MG tablet TAKE 1 TABLET TWICE A DAY 180 tablet 3  . Vitamin D, Ergocalciferol, (DRISDOL) 1.25 MG (50000 UT) CAPS capsule Take 1 capsule (50,000 Units total) by mouth every 7 (seven) days. 4 capsule 0   No current facility-administered medications for this visit.     Medication Side Effects: None  Allergies:  Allergies  Allergen Reactions  . Hydrochlorothiazide     REACTION: hypokalemia  . Benazepril     Nausea& vomiting, abd cramps    Past Medical History:  Diagnosis Date  . Anxiety   . Depression   . Dry mouth   . Fibroids   . Gallbladder disease   . High cholesterol   . HTN (hypertension) 2007   seen in ER  with headaches  . Hypertension   . Hypokalemia 2007  due to HCTZ  . Nervousness   . Obesity   . Stress   . Swelling of lower extremity   . Trouble in sleeping     Family History  Problem Relation Age of Onset  . Cancer Maternal Grandmother        thyroid  . Hypertension Maternal Grandmother   . Hyperlipidemia Mother   . Hypertension Mother   . Depression Mother   . Anxiety disorder Mother   . Obesity Mother   . Breast cancer Maternal Aunt   . Kidney disease Maternal Aunt         X 2; 1 on Dialysis  . Cancer Maternal Aunt        ? primary; also had HTN  . Prostate cancer Paternal Uncle          3 uncles   . Prostate cancer Father   . Cancer Father   . Hypertension Paternal Grandmother   . Hypertension Maternal Aunt        X 2  . Bipolar disorder Paternal Aunt   . Stroke Neg Hx   . Diabetes Neg Hx   . Heart disease Neg Hx   . Colon cancer Neg Hx     Social History   Socioeconomic History  . Marital status: Single    Spouse name: Not on file  . Number of children: 1  . Years of education: Not on file  . Highest education level: Not on file  Occupational History  . Occupation: Sales promotion account executive  . Financial resource strain: Not on file  . Food insecurity    Worry: Not on file    Inability: Not on file  . Transportation needs    Medical: Not on file    Non-medical: Not on file  Tobacco Use  . Smoking status: Never Smoker  . Smokeless tobacco: Never Used  Substance and Sexual Activity  . Alcohol use: No  . Drug use: No  . Sexual activity: Not Currently    Comment: 1st intercourse- 66, partners- 10  Lifestyle  . Physical activity    Days per week: Not on file    Minutes per session: Not on file  . Stress: Not on file  Relationships  . Social Herbalist on phone: Not on file    Gets together: Not on file    Attends religious service: Not on file    Active member of club or organization: Not on file    Attends meetings of clubs or organizations: Not on file    Relationship status: Not on file  . Intimate partner violence    Fear of current or ex partner: Not on file    Emotionally abused: Not on file    Physically abused: Not on file    Forced sexual activity: Not on file  Other Topics Concern  . Not on file  Social History Narrative  . Not on file    Past Medical History, Surgical history, Social history, and Family history were reviewed and updated as appropriate.   Please see review of systems for further details on the patient's review from today.   Objective:   Physical Exam:  LMP  (LMP Unknown)   Physical Exam Constitutional:       General: She is not in acute distress.    Appearance: She is well-developed.  Musculoskeletal:        General: No deformity.  Neurological:     Mental Status: She  is alert and oriented to person, place, and time.     Motor: No tremor.     Coordination: Coordination normal.     Gait: Gait normal.  Psychiatric:        Attention and Perception: She is attentive. She does not perceive auditory or visual hallucinations.        Mood and Affect: Mood is not depressed. Affect is not labile, blunt, angry or inappropriate.        Speech: Speech normal.        Behavior: Behavior normal.        Thought Content: Thought content normal. Thought content does not include homicidal or suicidal ideation. Thought content does not include homicidal or suicidal plan.        Cognition and Memory: Cognition normal.        Judgment: Judgment normal.     Comments: Insight intact. No auditory or visual hallucinations. No delusions.  Residual anxiety is manageable     Lab Review:     Component Value Date/Time   NA 142 05/05/2018 1252   K 4.7 05/05/2018 1252   CL 105 05/05/2018 1252   CO2 21 05/05/2018 1252   GLUCOSE 85 05/05/2018 1252   GLUCOSE 87 06/08/2017 1343   BUN 12 05/05/2018 1252   CREATININE 1.12 (H) 05/05/2018 1252   CREATININE 1.28 (H) 02/19/2017 0851   CALCIUM 9.7 05/05/2018 1252   PROT 7.0 05/05/2018 1252   ALBUMIN 4.1 05/05/2018 1252   AST 32 05/05/2018 1252   ALT 44 (H) 05/05/2018 1252   ALKPHOS 130 (H) 05/05/2018 1252   BILITOT 0.4 05/05/2018 1252   GFRNONAA 57 (L) 05/05/2018 1252   GFRAA 65 05/05/2018 1252       Component Value Date/Time   WBC 8.0 05/05/2018 1252   WBC 7.5 11/02/2017 0839   RBC 4.72 05/05/2018 1252   RBC 4.54 11/02/2017 0839   HGB 12.8 05/05/2018 1252   HCT 40.4 05/05/2018 1252   PLT 293 11/02/2017 0839   MCV 86 05/05/2018 1252   MCH 27.1 05/05/2018 1252   MCH 27.5 11/02/2017 0839   MCHC 31.7 05/05/2018 1252   MCHC 31.9 (L) 11/02/2017 0839    RDW 13.6 05/05/2018 1252   LYMPHSABS 2.1 05/05/2018 1252   MONOABS 0.6 06/08/2017 1343   EOSABS 0.1 05/05/2018 1252   BASOSABS 0.0 05/05/2018 1252    No results found for: POCLITH, LITHIUM   No results found for: PHENYTOIN, PHENOBARB, VALPROATE, CBMZ   .res Assessment: Plan:    Brynja was seen today for depression and anxiety.  Diagnoses and all orders for this visit:  Depression, major, recurrent, in complete remission ([place])  Panic disorder with agoraphobia  Generalized anxiety disorder  Insomnia due to mental condition  Greater than 50% of face to face time with patient was spent on counseling and coordination of care. We discussed Still sleep deprived but no longer DT work Schedule.  Discussed trying to get more sleep.  Could also help with her weight loss which is a goal.  Has gotten much better at setting boundaries with work and not overworking.  Mood is therefore better. Discussed meds and SE.  OK bz for flying phobia as needed.    We discussed the short-term risks associated with benzodiazepines including sedation and increased fall risk among others.  Discussed long-term side effect risk including dependence, potential withdrawal symptoms, and the potential eventual dose-related risk of dementia. She feels that she is on the lowest effective dose.  No  med changes indicated.  Per her request   This appt was 30 mins.  FU 6 mos.  Lynder Parents, MD, DFAPA   Please see After Visit Summary for patient specific instructions.  Future Appointments  Date Time Provider [place]  09/03/2018  9:15 AM Etter Sjogren Koren Shiver, DO [facility]  09/06/2018 12:40 PM Eber Jones, MD [facility] None  09/14/2018  8:30 AM Nash Dimmer, PsyD [facility] None  09/16/2018  5:10 PM [facility]    No orders of the defined types were placed in this encounter.     -------------------------------

## 2018-08-27 ENCOUNTER — Encounter: Payer: Self-pay | Admitting: Obstetrics & Gynecology

## 2018-08-27 NOTE — Patient Instructions (Signed)
1. Encounter for routine gynecological examination with Papanicolaou smear of cervix Gynecologic exam with large uterine fibroids.  Last year's Pap test showed ASCUS with negative high-risk HPV, Pap reflex done today.  Breast exam normal.  Health labs with family physician.  2. Fibroids Large fibroids but minimally symptomatic currently on the progestin only pill.  Had Depo-Lupron 33-month dose x2 in September 2019 and January 2020.  No menstrual period since September 2019.  Sensation of fullness in the lower abdomen but no pain.  We will continue to observe on the progestin only pill.  3. Encounter for surveillance of contraceptive pills Well on the progestin only pill.  No contraindication to continue.  Represcribed.  4. Obesity (BMI 30-39.9) Followed by the [facility].  Losing weight on a nutrition plan and fitness program.  Encouraged to continue.  [name], it was a pleasure seeing you today!  I will inform you of your results as soon as they are available.

## 2018-09-03 ENCOUNTER — Encounter: Payer: Self-pay | Admitting: Family Medicine

## 2018-09-03 ENCOUNTER — Encounter

## 2018-09-03 ENCOUNTER — Ambulatory Visit (INDEPENDENT_AMBULATORY_CARE_PROVIDER_SITE_OTHER): Payer: BC Managed Care – PPO | Admitting: Family Medicine

## 2018-09-03 ENCOUNTER — Other Ambulatory Visit: Payer: Self-pay

## 2018-09-03 VITALS — BP 112/70 | HR 73 | Temp 98.1°F | Resp 16 | Ht 67.0 in | Wt 232.2 lb

## 2018-09-03 DIAGNOSIS — Z0000 Encounter for general adult medical examination without abnormal findings: Secondary | ICD-10-CM

## 2018-09-03 DIAGNOSIS — R7309 Other abnormal glucose: Secondary | ICD-10-CM | POA: Diagnosis not present

## 2018-09-03 DIAGNOSIS — I10 Essential (primary) hypertension: Secondary | ICD-10-CM

## 2018-09-03 DIAGNOSIS — E785 Hyperlipidemia, unspecified: Secondary | ICD-10-CM

## 2018-09-03 DIAGNOSIS — F411 Generalized anxiety disorder: Secondary | ICD-10-CM

## 2018-09-03 LAB — COMPREHENSIVE METABOLIC PANEL
ALT: 20 U/L (ref 0–35)
AST: 16 U/L (ref 0–37)
Albumin: 4.2 g/dL (ref 3.5–5.2)
Alkaline Phosphatase: 131 U/L — ABNORMAL HIGH (ref 39–117)
BUN: 19 mg/dL (ref 6–23)
CO2: 24 mEq/L (ref 19–32)
Calcium: 9.7 mg/dL (ref 8.4–10.5)
Chloride: 105 mEq/L (ref 96–112)
Creatinine, Ser: 1.34 mg/dL — ABNORMAL HIGH (ref 0.40–1.20)
GFR: 50 mL/min — ABNORMAL LOW (ref >60.00)
Glucose, Bld: 92 mg/dL (ref 70–99)
Potassium: 4.6 mEq/L (ref 3.5–5.1)
Sodium: 138 mEq/L (ref 135–145)
Total Bilirubin: 0.5 mg/dL (ref 0.2–1.2)
Total Protein: 7.2 g/dL (ref 6.0–8.3)

## 2018-09-03 LAB — CBC WITH DIFFERENTIAL/PLATELET
Basophils Absolute: 0 10*3/uL (ref 0.0–0.1)
Basophils Relative: 0.4 % (ref 0.0–3.0)
Eosinophils Absolute: 0.1 10*3/uL (ref 0.0–0.7)
Eosinophils Relative: 1 % (ref 0.0–5.0)
HCT: 38.8 % (ref 36.0–46.0)
Hemoglobin: 12.5 g/dL (ref 12.0–15.0)
Lymphocytes Relative: 20.8 % (ref 12.0–46.0)
Lymphs Abs: 1.9 10*3/uL (ref 0.7–4.0)
MCHC: 32.2 g/dL (ref 30.0–36.0)
MCV: 86.3 fl (ref 78.0–100.0)
Monocytes Absolute: 0.6 10*3/uL (ref 0.1–1.0)
Monocytes Relative: 6.4 % (ref 3.0–12.0)
Neutro Abs: 6.6 10*3/uL (ref 1.4–7.7)
Neutrophils Relative %: 71.4 % (ref 43.0–77.0)
Platelets: 291 10*3/uL (ref 150.0–400.0)
RBC: 4.5 Mil/uL (ref 3.87–5.11)
RDW: 15.5 % (ref 11.5–15.5)
WBC: 9.2 10*3/uL (ref 4.0–10.5)

## 2018-09-03 LAB — LIPID PANEL
Cholesterol: 148 mg/dL (ref 0–200)
HDL: 45.5 mg/dL (ref >39.00)
LDL Cholesterol: 87 mg/dL (ref 0–99)
NonHDL: 102.06
Total CHOL/HDL Ratio: 3
Triglycerides: 74 mg/dL (ref 0.0–149.0)
VLDL: 14.8 mg/dL (ref 0.0–40.0)

## 2018-09-03 LAB — VITAMIN D 25 HYDROXY (VIT D DEFICIENCY, FRACTURES): VITD: 39.37 ng/mL (ref 30.00–100.00)

## 2018-09-03 LAB — TSH: TSH: 2.31 u[IU]/mL (ref 0.35–4.50)

## 2018-09-03 NOTE — Assessment & Plan Note (Signed)
Well controlled, no changes to meds. Encouraged heart healthy diet such as the DASH diet and exercise as tolerated.  °

## 2018-09-03 NOTE — Assessment & Plan Note (Signed)
Check labs today.

## 2018-09-03 NOTE — Progress Notes (Signed)
Subjective:     Cheryl Owens is a 53 y.o. female and is here for a comprehensive physical exam. The patient reports no problems.  Social History   Socioeconomic History  . Marital status: Single    Spouse name: Not on file  . Number of children: 1  . Years of education: Not on file  . Highest education level: Not on file  Occupational History  . Occupation: Sales promotion account executive  . Financial resource strain: Not on file  . Food insecurity    Worry: Not on file    Inability: Not on file  . Transportation needs    Medical: Not on file    Non-medical: Not on file  Tobacco Use  . Smoking status: Never Smoker  . Smokeless tobacco: Never Used  Substance and Sexual Activity  . Alcohol use: No  . Drug use: No  . Sexual activity: Not Currently    Comment: 1st intercourse- 38, partners- 10  Lifestyle  . Physical activity    Days per week: Not on file    Minutes per session: Not on file  . Stress: Not on file  Relationships  . Social Herbalist on phone: Not on file    Gets together: Not on file    Attends religious service: Not on file    Active member of club or organization: Not on file    Attends meetings of clubs or organizations: Not on file    Relationship status: Not on file  . Intimate partner violence    Fear of current or ex partner: Not on file    Emotionally abused: Not on file    Physically abused: Not on file    Forced sexual activity: Not on file  Other Topics Concern  . Not on file  Social History Narrative  . Not on file   Health Maintenance  Topic Date Due  . MAMMOGRAM  03/30/2018  . HIV Screening  03/14/2027 (Originally 05/11/1980)  . TETANUS/TDAP  09/29/2018  . INFLUENZA VACCINE  10/09/2018  . PAP SMEAR-Modifier  08/19/2021  . COLONOSCOPY  04/28/2028    The following portions of the patient's history were reviewed and updated as appropriate:  She  has a past medical history of Anxiety, Depression, Dry mouth, Fibroids,  Gallbladder disease, High cholesterol, HTN (hypertension) (2007), Hypertension, Hypokalemia (2007), Nervousness, Obesity, Stress, Swelling of lower extremity, and Trouble in sleeping. She does not have any pertinent problems on file. She  has a past surgical history that includes arm surgery; Cholecystectomy; Wisdom tooth extraction; and Breast surgery (Right, 2016). Her family history includes Anxiety disorder in her mother; Bipolar disorder in her paternal aunt; Breast cancer in her maternal aunt; Cancer in her father, maternal aunt, and maternal grandmother; Depression in her mother; Hyperlipidemia in her mother; Hypertension in her maternal aunt, maternal grandmother, mother, and paternal grandmother; Kidney disease in her maternal aunt; Obesity in her mother; Prostate cancer in her father and paternal uncle. She  reports that she has never smoked. She has never used smokeless tobacco. She reports that she does not drink alcohol or use drugs. She has a current medication list which includes the following prescription(s): atorvastatin, bupropion, clonazepam, escitalopram, heather, labetalol, spironolactone, and vitamin d (ergocalciferol). Current Outpatient Medications on File Prior to Visit  Medication Sig Dispense Refill  . atorvastatin (LIPITOR) 20 MG tablet TAKE 1 TABLET DAILY 90 tablet 3  . buPROPion (WELLBUTRIN XL) 300 MG 24 hr tablet Take 1 tablet (  300 mg total) by mouth every morning. 90 tablet 0  . clonazePAM (KLONOPIN) 0.5 MG tablet TAKE 1/2 TABLET IN THE MORNING AND 2 AT BEDTIME 225 tablet 0  . escitalopram (LEXAPRO) 10 MG tablet TAKE 1 TABLET DAILY 90 tablet 3  . [name] 0.35 MG tablet TAKE 1 TABLET DAILY 84 tablet 0  . labetalol (NORMODYNE) 100 MG tablet Take 1 tablet (100 mg total) by mouth 2 (two) times daily. 180 tablet 3  . spironolactone (ALDACTONE) 25 MG tablet TAKE 1 TABLET TWICE A DAY 180 tablet 3  . Vitamin D, Ergocalciferol, (DRISDOL) 1.25 MG (50000 UT) CAPS capsule Take 1  capsule (50,000 Units total) by mouth every 7 (seven) days. 4 capsule 0   No current facility-administered medications on file prior to visit.    She is allergic to hydrochlorothiazide and benazepril..  Review of Systems Review of Systems  Constitutional: Negative for activity change, appetite change and fatigue.  HENT: Negative for hearing loss, congestion, tinnitus and ear discharge.  dentist q79m Eyes: Negative for visual disturbance (see optho q1y -- vision corrected to 20/20 with glasses).  Respiratory: Negative for cough, chest tightness and shortness of breath.   Cardiovascular: Negative for chest pain, palpitations and leg swelling.  Gastrointestinal: Negative for abdominal pain, diarrhea, constipation and abdominal distention.  Genitourinary: Negative for urgency, frequency, decreased urine volume and difficulty urinating.  Musculoskeletal: Negative for back pain, arthralgias and gait problem.  Skin: Negative for color change, pallor and rash.  Neurological: Negative for dizziness, light-headedness, numbness and headaches.  Hematological: Negative for adenopathy. Does not bruise/bleed easily.  Psychiatric/Behavioral: Negative for suicidal ideas, confusion, sleep disturbance, self-injury, dysphoric mood, decreased concentration and agitation.       Objective:    BP 112/70   Pulse 73   Temp 98.1 F (36.7 C) (Oral)   Resp 16   Ht 5\' 7"  (1.702 m)   Wt 232 lb 3.2 oz (105.3 kg)   LMP  (LMP Unknown)   SpO2 100%   BMI 36.37 kg/m  General appearance: alert, cooperative, appears stated age and no distress Head: Normocephalic, without obvious abnormality, atraumatic Eyes: conjunctivae/corneas clear. PERRL, EOM's intact. Fundi benign. Ears: normal TM's and external ear canals both ears Nose: Nares normal. Septum midline. Mucosa normal. No drainage or sinus tenderness. Throat: lips, mucosa, and tongue normal; teeth and gums normal Neck: no adenopathy, no carotid bruit, no JVD,  supple, symmetrical, trachea midline and thyroid not enlarged, symmetric, no tenderness/mass/nodules Back: symmetric, no curvature. ROM normal. No CVA tenderness. Lungs: clear to auscultation bilaterally Breasts: gyn Heart: regular rate and rhythm, S1, S2 normal, no murmur, click, rub or gallop Abdomen: soft, non-tender; bowel sounds normal; no masses,  no organomegaly Pelvic: deferred--gyn Extremities: extremities normal, atraumatic, no cyanosis or edema Pulses: 2+ and symmetric Skin: Skin color, texture, turgor normal. No rashes or lesions Lymph nodes: Cervical, supraclavicular, and axillary nodes normal. Neurologic: Alert and oriented X 3, normal strength and tone. Normal symmetric reflexes. Normal coordination and gait    Assessment:    Healthy female exam.      Plan:     ghm utd Check labs See After Visit Summary for Counseling Recommendations    1. Preventative health care See above  - TSH - Lipid panel - CBC with Differential/Platelet - Comprehensive metabolic panel  2. Morbid obesity ([place]) Per healthy weight and wellness  - Vitamin D (25 hydroxy) - Insulin, random  3. Hyperlipidemia LDL goal <100 Tolerating statin, encouraged heart healthy diet, avoid trans  fats, minimize simple carbs and saturated fats. Increase exercise as tolerated  4. FASTING HYPERGLYCEMIA con't diet Check labs   5. Essential hypertension Well controlled, no changes to meds. Encouraged heart healthy diet such as the DASH diet and exercise as tolerated.   6. Anxiety state Stable con't meds

## 2018-09-03 NOTE — Assessment & Plan Note (Signed)
Tolerating statin, encouraged heart healthy diet, avoid trans fats, minimize simple carbs and saturated fats. Increase exercise as tolerated 

## 2018-09-03 NOTE — Assessment & Plan Note (Signed)
Stable con't meds 

## 2018-09-03 NOTE — Patient Instructions (Signed)
Preventive Care 40-64 Years, Female Preventive care refers to lifestyle choices and visits with your health care provider that can promote health and wellness. What does preventive care include?   A yearly physical exam. This is also called an annual well check.  Dental exams once or twice a year.  Routine eye exams. Ask your health care provider how often you should have your eyes checked.  Personal lifestyle choices, including: ? Daily care of your teeth and gums. ? Regular physical activity. ? Eating a healthy diet. ? Avoiding tobacco and drug use. ? Limiting alcohol use. ? Practicing safe sex. ? Taking low-dose aspirin daily starting at age 56. ? Taking vitamin and mineral supplements as recommended by your health care provider. What happens during an annual well check? The services and screenings done by your health care provider during your annual well check will depend on your age, overall health, lifestyle risk factors, and family history of disease. Counseling Your health care provider may ask you questions about your:  Alcohol use.  Tobacco use.  Drug use.  Emotional well-being.  Home and relationship well-being.  Sexual activity.  Eating habits.  Work and work Statistician.  Method of birth control.  Menstrual cycle.  Pregnancy history. Screening You may have the following tests or measurements:  Height, weight, and BMI.  Blood pressure.  Lipid and cholesterol levels. These may be checked every 5 years, or more frequently if you are over 74 years old.  Skin check.  Lung cancer screening. You may have this screening every year starting at age 78 if you have a 30-pack-year history of smoking and currently smoke or have quit within the past 15 years.  Colorectal cancer screening. All adults should have this screening starting at age 81 and continuing until age 41. Your health care provider may recommend screening at age 33. You will have tests every  1-10 years, depending on your results and the type of screening test. People at increased risk should start screening at an earlier age. Screening tests may include: ? Guaiac-based fecal occult blood testing. ? Fecal immunochemical test (FIT). ? Stool DNA test. ? Virtual colonoscopy. ? Sigmoidoscopy. During this test, a flexible tube with a tiny camera (sigmoidoscope) is used to examine your rectum and lower colon. The sigmoidoscope is inserted through your anus into your rectum and lower colon. ? Colonoscopy. During this test, a long, thin, flexible tube with a tiny camera (colonoscope) is used to examine your entire colon and rectum.  Hepatitis C blood test.  Hepatitis B blood test.  Sexually transmitted disease (STD) testing.  Diabetes screening. This is done by checking your blood sugar (glucose) after you have not eaten for a while (fasting). You may have this done every 1-3 years.  Mammogram. This may be done every 1-2 years. Talk to your health care provider about when you should start having regular mammograms. This may depend on whether you have a family history of breast cancer.  BRCA-related cancer screening. This may be done if you have a family history of breast, ovarian, tubal, or peritoneal cancers.  Pelvic exam and Pap test. This may be done every 3 years starting at age 75. Starting at age 8, this may be done every 5 years if you have a Pap test in combination with an HPV test.  Bone density scan. This is done to screen for osteoporosis. You may have this scan if you are at high risk for osteoporosis. Discuss your test results, treatment options,  and if necessary, the need for more tests with your health care provider. Vaccines Your health care provider may recommend certain vaccines, such as:  Influenza vaccine. This is recommended every year.  Tetanus, diphtheria, and acellular pertussis (Tdap, Td) vaccine. You may need a Td booster every 10 years.  Varicella  vaccine. You may need this if you have not been vaccinated.  Zoster vaccine. You may need this after age 38.  Measles, mumps, and rubella (MMR) vaccine. You may need at least one dose of MMR if you were born in 1957 or later. You may also need a second dose.  Pneumococcal 13-valent conjugate (PCV13) vaccine. You may need this if you have certain conditions and were not previously vaccinated.  Pneumococcal polysaccharide (PPSV23) vaccine. You may need one or two doses if you smoke cigarettes or if you have certain conditions.  Meningococcal vaccine. You may need this if you have certain conditions.  Hepatitis A vaccine. You may need this if you have certain conditions or if you travel or work in places where you may be exposed to hepatitis A.  Hepatitis B vaccine. You may need this if you have certain conditions or if you travel or work in places where you may be exposed to hepatitis B.  Haemophilus influenzae type b (Hib) vaccine. You may need this if you have certain conditions. Talk to your health care provider about which screenings and vaccines you need and how often you need them. This information is not intended to replace advice given to you by your health care provider. Make sure you discuss any questions you have with your health care provider. Document Released: 03/23/2015 Document Revised: 04/16/2017 Document Reviewed: 12/26/2014 Elsevier Interactive Patient Education  2019 [employer].

## 2018-09-06 ENCOUNTER — Encounter (INDEPENDENT_AMBULATORY_CARE_PROVIDER_SITE_OTHER): Payer: Self-pay | Admitting: Family Medicine

## 2018-09-06 ENCOUNTER — Telehealth (INDEPENDENT_AMBULATORY_CARE_PROVIDER_SITE_OTHER): Payer: BC Managed Care – PPO | Admitting: Family Medicine

## 2018-09-06 ENCOUNTER — Other Ambulatory Visit: Payer: Self-pay

## 2018-09-06 DIAGNOSIS — R7303 Prediabetes: Secondary | ICD-10-CM

## 2018-09-06 DIAGNOSIS — I10 Essential (primary) hypertension: Secondary | ICD-10-CM | POA: Diagnosis not present

## 2018-09-06 DIAGNOSIS — E669 Obesity, unspecified: Secondary | ICD-10-CM

## 2018-09-06 LAB — INSULIN, RANDOM: Insulin: 26 u[IU]/mL — ABNORMAL HIGH

## 2018-09-07 NOTE — Progress Notes (Signed)
Office: [phone]  /  Fax: [phone]   Date: September 14, 2018   Appointment Start Time: 8:00am Duration: 30 minutes Provider: Glennie Isle, Psy.D. Type of Session: Individual Therapy  Location of Patient: Home Location of Provider: [facility] Type of Contact: Telepsychological Visit via [employer] Web[employer]   Session Content: Cheryl Owens is a 53 y.o. female presenting via [place] for a follow-up appointment to address the previously established treatment goal of decreasing emotional eating. Today's appointment was a telepsychological visit, as this provider's clinic is seeing a limited number of patients for in-person visits due to COVID-19. Therapeutic services will resume to in-person appointments once deemed appropriate. Cheryl Owens expressed understanding regarding the rationale for telepsychological services, and provided verbal consent for today's appointment. Prior to proceeding with today's appointment, Cheryl Owens's physical location at the time of this appointment was obtained. Cheryl Owens reported she was at home and provided the address. In the event of technical difficulties, Cheryl Owens shared a phone number she could be reached at. Cheryl Owens and this provider participated in today's telepsychological service. Also, Cheryl Owens denied anyone else being present in the room or on the WebEx appointment.  This provider conducted a brief check-in and verbally administered the PHQ-9 and GAD-7. Cheryl Owens reported, "Things are still status quo." She discussed occasionally referring to the hunger scale this provider previously shared. Her utilization of the scale was further explored. [place] discussed what she experienced when she was physically hungry (e.g., feeling unfocused). She noted she is able to better identify when she experiences physical hunger, but discussed not making choices congruent to her meal plan or the options discussed with Cheryl Owens. Cheryl Owens also acknowledged snacking when she likely needs more (I.e., a  meal). Thus, the aforementioned was reflected to her. Cheryl Owens also noted fluctuations in appetite. More specifically, she noted a decreased appetite at home and overeating when she goes out; therefore, she stated she discussed intermittent fasting with Cheryl Owens during their last appointment. Emotional eating was also explored. Shantelle described a reduction since the onset of treatment with this provider.   Obstacles/barriers that may be getting in the way in relation to making choices congruent to the structured meal plan was explored. She believes "will power" is lacking. As such, this provider and Haru discussed what she feels is a realistic goal to set between now and the next appointment as it relates to her eating choices knowing her self-identified barrier of will power may be playing a role. Cheryl Owens set a goal of eating out less. More specifically, she indicated a goal to not going out to eat out more than twice a week. Furthermore, this provider discussed termination planning, specifically the option for a referral for longer-term therapeutic services. Cheryl Owens stated she continues to see her psychiatrist, and she last met with him after her last appointment with this provider. Psychoeduation regarding the role of a new therapist was provided. She provided verbal consent was provided for this provider to place a referral to focus on emotional eating and eating habits. Overall, Cheryl Owens was receptive to today's session as evidenced by openness to sharing, responsiveness to feedback, and willingness to work toward her established goal.  Mental Status Examination:  Appearance: neat Behavior: cooperative Mood: euthymic Affect: mood congruent Speech: normal in rate, volume, and tone Eye Contact: appropriate Psychomotor Activity: appropriate Thought Process: linear, logical, and goal directed  Content/Perceptual Disturbances: denies suicidal and homicidal ideation, plan, and intent and no hallucinations, delusions,  bizarre thinking or behavior reported or observed Orientation: time,  person, place and purpose of appointment Cognition/Sensorium: memory, attention, language, and fund of knowledge intact  Insight: fair Judgment: fair  Structured Assessment Results: The Patient Health Questionnaire-9 (PHQ-9) is a self-report measure that assesses symptoms and severity of depression over the course of the last two weeks. Cheryl Owens obtained a score of 4 suggesting minimal depression. Cheryl Owens finds the endorsed symptoms to be not difficult at all. Little interest or pleasure in doing things 0  Feeling down, depressed, or hopeless 0  Trouble falling or staying asleep, or sleeping too much 1  Feeling tired or having little energy 1  Poor appetite or overeating 1  Feeling bad about yourself --- or that you are a failure or have let yourself or your family down 1  Trouble concentrating on things, such as reading the newspaper or watching television 0  Moving or speaking so slowly that other people could have noticed? Or the opposite --- being so fidgety or restless that you have been moving around a lot more than usual 0  Thoughts that you would be better off dead or hurting yourself in some way 0  PHQ-9 Score 4    The Generalized Anxiety Disorder-7 (GAD-7) is a brief self-report measure that assesses symptoms of anxiety over the course of the last two weeks. Cheryl Owens obtained a score of 1 suggesting minimal anxiety. Cheryl Owens finds the endorsed symptoms to be somewhat difficult. Feeling nervous, anxious, on edge 0  Not being able to stop or control worrying 0  Worrying too much about different things 0  Trouble relaxing 1  Being so restless that it's hard to sit still 0  Becoming easily annoyed or irritable 0  Feeling afraid as if something awful might happen 0  GAD-7 Score 1   Interventions:  Conducted a brief chart review Verbal administration of PHQ-9 and GAD-7 for symptom monitoring Reviewed content from the previous  session Engaged patient in goal setting Engaged patient in problem solving Discussed termination planning Discussed option for a referral for longer-term therapeutic services Employed supportive psychotherapy interventions to facilitate reduced distress, and to improve coping skills with identified stressors  DSM-5 Diagnosis: 311 (F32.8) Other Specified Depressive Disorder, Emotional Eating Behaviors  Treatment Goal & Progress: During the initial appointment with this provider, the following treatment goal was established: decrease emotional eating. Cheryl Owens has demonstrated progress in her goal as evidenced by increased awareness of hunger patterns and triggers for emotional eating. While there has been a reduction in emotional eating, Cheryl Owens acknowledged deviations from the structured meal plan.   Plan: Cheryl Owens continues to appear able and willing to participate as evidenced by engagement in reciprocal conversation, and asking questions for clarification as appropriate. The next appointment will be scheduled in two weeks, which will be via [employer]. Once this provider's office resumes in-person appointments and it is deemed appropriate, Cheryl Owens will be notified. The next session will focus on reviewing the established goal, and on mindfulness further as it was not a focus of today's appointment based on Cheryl Owens' presenting concerns. Additionally, a referral for longer-term therapeutic services will be placed.

## 2018-09-07 NOTE — Progress Notes (Signed)
Office: [phone]  /  Fax: [phone] TeleHealth Visit:  Cheryl Owens has verbally consented to this TeleHealth visit today. The patient is located at [facility], the provider is located at the [employer] [facility]. The participants in this visit include the listed provider and patient. The visit was conducted today via webex.  HPI:   Chief Complaint: OBESITY Cheryl Owens is here to discuss her progress with her obesity treatment plan. She is on the Category 3 plan and is following her eating plan approximately 50-60 % of the time. She states she is walking 2 miles 7 times per week. Cheryl Owens voices her last 2 weeks have been difficult. She feels stuck and hasn't been able to walk much, secondary to heat. He has been searching for more options snack wise. She is struggling to get all protein in and finding substitutions when she is in a bind for liquid.  We were unable to weigh the patient today for this TeleHealth visit. She feels as if she has maintained her weight since her last visit. She has lost 7 lbs since starting treatment with [place].  Hypertension Cheryl Owens is a 54 y.o. female with hypertension. Ambre's blood pressure was controlled in the office previously. She denies chest pain, chest pressure, or headaches. She is working on weight loss to help control her blood pressure with the goal of decreasing her risk of heart attack and stroke.   Pre-Diabetes Cheryl Owens has a diagnosis of pre-diabetes based on her elevated Hgb A1c and was informed this puts her at greater risk of developing diabetes. Last Hgb A1c was on 05/05/2018. She is not taking metformin currently and continues to work on diet and exercise to decrease risk of diabetes. She denies nausea or hypoglycemia.  ASSESSMENT AND PLAN:  Essential hypertension  Prediabetes  Class 2 obesity  PLAN:  Hypertension We discussed sodium restriction, working on healthy weight loss, and a regular exercise program as the means to achieve  improved blood pressure control. Asante agreed with this plan and agreed to follow up as directed. We will continue to monitor her blood pressure as well as her progress with the above lifestyle modifications. Cheryl Owens agrees to continue her current medications and will watch for signs of hypotension as she continues her lifestyle modifications. We will follow up on her blood pressure at next appointment. Cheryl Owens agrees to follow up with our clinic in 2 weeks.  Pre-Diabetes Cheryl Owens will continue to work on weight loss, exercise, and decreasing simple carbohydrates in her diet to help decrease the risk of diabetes. We dicussed metformin including benefits and risks. She was informed that eating too many simple carbohydrates or too many calories at one sitting increases the likelihood of GI side effects. Libertie declined metformin for now and a prescription was not written today. We will repeat labs at her first in office appointment. Cheryl Owens agrees to follow up with our clinic in 2 weeks as directed to monitor her progress.  Obesity Cheryl Owens is currently in the action stage of change. As such, her goal is to continue with weight loss efforts She has agreed to keep a food journal with 1500-1600 calories and 90+ grams of protein daily or follow the Category 3 plan Cheryl [facility] has been instructed to work up to a goal of 150 minutes of combined cardio and strengthening exercise per week for weight loss and overall health benefits. We discussed the following Behavioral Modification Strategies today: increasing lean protein intake, increasing vegetables and work on meal  planning and easy cooking plans, keeping healthy foods in the [facility], and planning for success   Cheryl [facility] has agreed to follow up with our clinic in 2 weeks. She was informed of the importance of frequent follow up visits to maximize her success with intensive lifestyle modifications for her multiple health conditions.  ALLERGIES: Allergies  Allergen Reactions  .  Hydrochlorothiazide     REACTION: hypokalemia  . Benazepril     Nausea& vomiting, abd cramps    MEDICATIONS: Current Outpatient Medications on File Prior to Visit  Medication Sig Dispense Refill  . atorvastatin (LIPITOR) 20 MG tablet TAKE 1 TABLET DAILY 90 tablet 3  . buPROPion (WELLBUTRIN XL) 300 MG 24 hr tablet Take 1 tablet (300 mg total) by mouth every morning. 90 tablet 0  . clonazePAM (KLONOPIN) 0.5 MG tablet TAKE 1/2 TABLET IN THE MORNING AND 2 AT BEDTIME 225 tablet 0  . escitalopram (LEXAPRO) 10 MG tablet TAKE 1 TABLET DAILY 90 tablet 3  . [name] 0.35 MG tablet TAKE 1 TABLET DAILY 84 tablet 0  . labetalol (NORMODYNE) 100 MG tablet Take 1 tablet (100 mg total) by mouth 2 (two) times daily. 180 tablet 3  . spironolactone (ALDACTONE) 25 MG tablet TAKE 1 TABLET TWICE A DAY 180 tablet 3  . Vitamin D, Ergocalciferol, (DRISDOL) 1.25 MG (50000 UT) CAPS capsule Take 1 capsule (50,000 Units total) by mouth every 7 (seven) days. 4 capsule 0   No current facility-administered medications on file prior to visit.     PAST MEDICAL HISTORY: Past Medical History:  Diagnosis Date  . Anxiety   . Depression   . Dry mouth   . Fibroids   . Gallbladder disease   . High cholesterol   . HTN (hypertension) 2007   seen in ER  with headaches  . Hypertension   . Hypokalemia 2007   due to HCTZ  . Nervousness   . Obesity   . Stress   . Swelling of lower extremity   . Trouble in sleeping     PAST SURGICAL HISTORY: Past Surgical History:  Procedure Laterality Date  . arm surgery     post trauma @ age 51  . BREAST SURGERY Right 2016   BREAST BX   . CHOLECYSTECTOMY     gall stones  . WISDOM TOOTH EXTRACTION      SOCIAL HISTORY: Social History   Tobacco Use  . Smoking status: Never Smoker  . Smokeless tobacco: Never Used  Substance Use Topics  . Alcohol use: No  . Drug use: No    FAMILY HISTORY: Family History  Problem Relation Age of Onset  . Cancer Maternal Grandmother         thyroid  . Hypertension Maternal Grandmother   . Hyperlipidemia Mother   . Hypertension Mother   . Depression Mother   . Anxiety disorder Mother   . Obesity Mother   . Breast cancer Maternal Aunt   . Kidney disease Maternal Aunt         X 2; 1 on Dialysis  . Cancer Maternal Aunt        ? primary; also had HTN  . Prostate cancer Paternal Uncle          3 uncles  . Prostate cancer Father   . Cancer Father   . Hypertension Paternal Grandmother   . Hypertension Maternal Aunt        X 2  . Bipolar disorder Paternal Aunt   . Stroke Neg Hx   .  Diabetes Neg Hx   . Heart disease Neg Hx   . Colon cancer Neg Hx     ROS: Review of Systems  Constitutional: Negative for weight loss.  Cardiovascular: Negative for chest pain.       Negative chest pressure  Gastrointestinal: Negative for nausea.  Neurological: Negative for headaches.  Endo/Heme/Allergies:       Negative hypoglycemia    PHYSICAL EXAM: Pt in no acute distress  RECENT LABS AND TESTS: BMET    Component Value Date/Time   NA 138 09/03/2018 0929   NA 142 05/05/2018 1252   K 4.6 09/03/2018 0929   CL 105 09/03/2018 0929   CO2 24 09/03/2018 0929   GLUCOSE 92 09/03/2018 0929   BUN 19 09/03/2018 0929   BUN 12 05/05/2018 1252   CREATININE 1.34 (H) 09/03/2018 0929   CREATININE 1.28 (H) 02/19/2017 0851   CALCIUM 9.7 09/03/2018 0929   GFRNONAA 57 (L) 05/05/2018 1252   GFRAA 65 05/05/2018 1252   Lab Results  Component Value Date   HGBA1C 5.9 (H) 05/05/2018   HGBA1C 5.4 04/19/2015   HGBA1C 5.7 02/15/2013   HGBA1C 5.4 12/03/2010   HGBA1C 5.3 09/28/2008   Lab Results  Component Value Date   INSULIN 18.3 05/05/2018   CBC    Component Value Date/Time   WBC 9.2 09/03/2018 0929   RBC 4.50 09/03/2018 0929   HGB 12.5 09/03/2018 0929   HGB 12.8 05/05/2018 1252   HCT 38.8 09/03/2018 0929   HCT 40.4 05/05/2018 1252   PLT 291.0 09/03/2018 0929   MCV 86.3 09/03/2018 0929   MCV 86 05/05/2018 1252   MCH 27.1  05/05/2018 1252   MCH 27.5 11/02/2017 0839   MCHC 32.2 09/03/2018 0929   RDW 15.5 09/03/2018 0929   RDW 13.6 05/05/2018 1252   LYMPHSABS 1.9 09/03/2018 0929   LYMPHSABS 2.1 05/05/2018 1252   MONOABS 0.6 09/03/2018 0929   EOSABS 0.1 09/03/2018 0929   EOSABS 0.1 05/05/2018 1252   BASOSABS 0.0 09/03/2018 0929   BASOSABS 0.0 05/05/2018 1252   Iron/TIBC/Ferritin/ %Sat No results found for: IRON, TIBC, FERRITIN, IRONPCTSAT Lipid Panel     Component Value Date/Time   CHOL 148 09/03/2018 0929   CHOL 160 05/05/2018 1252   TRIG 74.0 09/03/2018 0929   HDL 45.50 09/03/2018 0929   HDL 54 05/05/2018 1252   CHOLHDL 3 09/03/2018 0929   VLDL 14.8 09/03/2018 0929   LDLCALC 87 09/03/2018 0929   LDLCALC 93 05/05/2018 1252   LDLCALC 129 (H) 02/19/2017 0851   Hepatic Function Panel     Component Value Date/Time   PROT 7.2 09/03/2018 0929   PROT 7.0 05/05/2018 1252   ALBUMIN 4.2 09/03/2018 0929   ALBUMIN 4.1 05/05/2018 1252   AST 16 09/03/2018 0929   ALT 20 09/03/2018 0929   ALKPHOS 131 (H) 09/03/2018 0929   BILITOT 0.5 09/03/2018 0929   BILITOT 0.4 05/05/2018 1252   BILIDIR 0.1 04/20/2014 0804      Component Value Date/Time   TSH 2.31 09/03/2018 0929   TSH 1.250 05/05/2018 1252   TSH 1.94 06/08/2017 1343      I, Trixie Dredge, am acting as transcriptionist for Ilene Qua, MD   I have reviewed the above documentation for accuracy and completeness, and I agree with the above. - Ilene Qua, MD

## 2018-09-12 ENCOUNTER — Other Ambulatory Visit: Payer: Self-pay | Admitting: Family Medicine

## 2018-09-12 DIAGNOSIS — R7989 Other specified abnormal findings of blood chemistry: Secondary | ICD-10-CM

## 2018-09-14 ENCOUNTER — Ambulatory Visit (INDEPENDENT_AMBULATORY_CARE_PROVIDER_SITE_OTHER): Payer: BC Managed Care – PPO | Admitting: Psychology

## 2018-09-14 ENCOUNTER — Other Ambulatory Visit: Payer: Self-pay

## 2018-09-14 DIAGNOSIS — F3289 Other specified depressive episodes: Secondary | ICD-10-CM | POA: Diagnosis not present

## 2018-09-16 ENCOUNTER — Other Ambulatory Visit: Payer: Self-pay

## 2018-09-16 ENCOUNTER — Ambulatory Visit
Admission: RE | Admit: 2018-09-16 | Discharge: 2018-09-16 | Disposition: A | Discharge status: Home / Self Care | Payer: BC Managed Care – PPO | Source: Ambulatory Visit | Attending: Obstetrics & Gynecology | Admitting: Obstetrics & Gynecology

## 2018-09-16 DIAGNOSIS — Z1231 Encounter for screening mammogram for malignant neoplasm of breast: Secondary | ICD-10-CM | POA: Diagnosis not present

## 2018-09-20 ENCOUNTER — Other Ambulatory Visit: Payer: Self-pay

## 2018-09-20 ENCOUNTER — Encounter (INDEPENDENT_AMBULATORY_CARE_PROVIDER_SITE_OTHER): Payer: Self-pay | Admitting: Family Medicine

## 2018-09-20 ENCOUNTER — Ambulatory Visit (INDEPENDENT_AMBULATORY_CARE_PROVIDER_SITE_OTHER): Payer: BC Managed Care – PPO | Admitting: Family Medicine

## 2018-09-20 DIAGNOSIS — Z6838 Body mass index (BMI) 38.0-38.9, adult: Secondary | ICD-10-CM | POA: Diagnosis not present

## 2018-09-20 DIAGNOSIS — E785 Hyperlipidemia, unspecified: Secondary | ICD-10-CM | POA: Diagnosis not present

## 2018-09-20 DIAGNOSIS — R7303 Prediabetes: Secondary | ICD-10-CM | POA: Diagnosis not present

## 2018-09-21 NOTE — Progress Notes (Signed)
Office: [phone]  /  Fax: [phone] TeleHealth Visit:  Cheryl [facility] has verbally consented to this TeleHealth visit today. The patient is located at home, the provider is located at the [employer] [facility] office. The participants in this visit include the listed provider and patient. The visit was conducted today via webex.  HPI:   Chief Complaint: OBESITY Cheryl [facility] is here to discuss her progress with her obesity treatment plan. She is on the keep a food journal with 1500-1600 calories and 90+ grams of protein daily or follow the Category 3 plan and is following her eating plan approximately 60 % of the time. She states she is walking 1 mile 4 times per week. Cheryl [facility] brought Muscle milk and has yet to try it. She voices protein was better than anticipated. She wants to try to limit eating takeout to 2 times per week. Her weight is of 230 lbs today (down in 2 lbs since her last in office appointment though).  We were unable to weigh the patient today for this TeleHealth visit. She feels as if she has maintained her weight since her last visit. She has lost 7 lbs since starting treatment with [place].  Pre-Diabetes Cheryl [facility] has a diagnosis of pre-diabetes based on her elevated Hgb A1c and was informed this puts her at greater risk of developing diabetes. She is still cravings high sugar fruits. She is not on metformin and last insulin was elevated, but Hgb A1c was not tested. She continues to work on diet and exercise to decrease risk of diabetes. She denies nausea or hypoglycemia.  Hyperlipidemia Cheryl Owens has hyperlipidemia and has been trying to improve her cholesterol levels with intensive lifestyle modification including a low saturated fat diet, exercise and weight loss. She is on statin and last CMP was within normal limits. She denies any chest pain, claudication or myalgias.  ASSESSMENT AND PLAN:  Prediabetes  Hyperlipidemia LDL goal <100  Class 2 severe obesity with serious comorbidity  and body mass index (BMI) of 38.0 to 38.9 in adult, unspecified obesity type [facility])  PLAN:  Pre-Diabetes Cheryl Owens will continue to work on weight loss, exercise, and decreasing simple carbohydrates in her diet to help decrease the risk of diabetes. We dicussed metformin including benefits and risks. She was informed that eating too many simple carbohydrates or too many calories at one sitting increases the likelihood of GI side effects. Cheryl Owens declined metformin for now and a prescription was not written today. We will repeat labs and Girl agrees to follow up with our clinic in 2 weeks as directed to monitor her progress.  Hyperlipidemia Cheryl Owens was informed of the American Heart Association Guidelines emphasizing intensive lifestyle modifications as the first line treatment for hyperlipidemia. We discussed many lifestyle modifications today in depth, and Cheryl Owens will continue to work on decreasing saturated fats such as fatty red meat, butter and many fried foods. She will also increase vegetables and lean protein in her diet and continue to work on exercise and weight loss efforts. Cheryl Owens agrees to continue taking statin and she agrees to follow up with our clinic in 2 weeks.  Obesity Cheryl Owens is currently in the action stage of change. As such, her goal is to continue with weight loss efforts She has agreed to follow the Category 3 plan Cheryl Owens has been instructed to work up to a goal of 150 minutes of combined cardio and strengthening exercise per week for weight loss and overall health benefits. We discussed the following Behavioral Modification  Strategies today: increasing lean protein intake, increasing vegetables and work on meal planning and easy cooking plans, keeping healthy foods in the home, better snacking choices, and planning for success   Cheryl Owens has agreed to follow up with our clinic in 2 weeks. She was informed of the importance of frequent follow up visits to maximize her success with intensive  lifestyle modifications for her multiple health conditions.  ALLERGIES: Allergies  Allergen Reactions  . Hydrochlorothiazide     REACTION: hypokalemia  . Benazepril     Nausea& vomiting, abd cramps    MEDICATIONS: Current Outpatient Medications on File Prior to Visit  Medication Sig Dispense Refill  . atorvastatin (LIPITOR) 20 MG tablet TAKE 1 TABLET DAILY 90 tablet 3  . buPROPion (WELLBUTRIN XL) 300 MG 24 hr tablet Take 1 tablet (300 mg total) by mouth every morning. 90 tablet 0  . clonazePAM (KLONOPIN) 0.5 MG tablet TAKE 1/2 TABLET IN THE MORNING AND 2 AT BEDTIME 225 tablet 0  . escitalopram (LEXAPRO) 10 MG tablet TAKE 1 TABLET DAILY 90 tablet 3  . [name] 0.35 MG tablet TAKE 1 TABLET DAILY 84 tablet 0  . labetalol (NORMODYNE) 100 MG tablet Take 1 tablet (100 mg total) by mouth 2 (two) times daily. 180 tablet 3  . spironolactone (ALDACTONE) 25 MG tablet TAKE 1 TABLET TWICE A DAY 180 tablet 3  . Vitamin D, Ergocalciferol, (DRISDOL) 1.25 MG (50000 UT) CAPS capsule Take 1 capsule (50,000 Units total) by mouth every 7 (seven) days. 4 capsule 0   No current facility-administered medications on file prior to visit.     PAST MEDICAL HISTORY: Past Medical History:  Diagnosis Date  . Anxiety   . Depression   . Dry mouth   . Fibroids   . Gallbladder disease   . High cholesterol   . HTN (hypertension) 2007   seen in ER  with headaches  . Hypertension   . Hypokalemia 2007   due to HCTZ  . Nervousness   . Obesity   . Stress   . Swelling of lower extremity   . Trouble in sleeping     PAST SURGICAL HISTORY: Past Surgical History:  Procedure Laterality Date  . arm surgery     post trauma @ age 32  . BREAST SURGERY Right 2016   BREAST BX   . CHOLECYSTECTOMY     gall stones  . WISDOM TOOTH EXTRACTION      SOCIAL HISTORY: Social History   Tobacco Use  . Smoking status: Never Smoker  . Smokeless tobacco: Never Used  Substance Use Topics  . Alcohol use: No  . Drug use:  No    FAMILY HISTORY: Family History  Problem Relation Age of Onset  . Cancer Maternal Grandmother        thyroid  . Hypertension Maternal Grandmother   . Hyperlipidemia Mother   . Hypertension Mother   . Depression Mother   . Anxiety disorder Mother   . Obesity Mother   . Breast cancer Maternal Aunt 51  . Kidney disease Maternal Aunt         X 2; 1 on Dialysis  . Cancer Maternal Aunt        ? primary; also had HTN  . Prostate cancer Paternal Uncle          3 uncles  . Prostate cancer Father   . Cancer Father   . Hypertension Paternal Grandmother   . Hypertension Maternal Aunt  X 2  . Bipolar disorder Paternal Aunt   . Stroke Neg Hx   . Diabetes Neg Hx   . Heart disease Neg Hx   . Colon cancer Neg Hx     ROS: Review of Systems  Constitutional: Negative for weight loss.  Cardiovascular: Negative for chest pain and claudication.  Gastrointestinal: Negative for nausea.  Musculoskeletal: Negative for myalgias.  Endo/Heme/Allergies:       Negative hypoglycemia    PHYSICAL EXAM: Pt in no acute distress  RECENT LABS AND TESTS: BMET    Component Value Date/Time   NA 138 09/03/2018 0929   NA 142 05/05/2018 1252   K 4.6 09/03/2018 0929   CL 105 09/03/2018 0929   CO2 24 09/03/2018 0929   GLUCOSE 92 09/03/2018 0929   BUN 19 09/03/2018 0929   BUN 12 05/05/2018 1252   CREATININE 1.34 (H) 09/03/2018 0929   CREATININE 1.28 (H) 02/19/2017 0851   CALCIUM 9.7 09/03/2018 0929   GFRNONAA 57 (L) 05/05/2018 1252   GFRAA 65 05/05/2018 1252   Lab Results  Component Value Date   HGBA1C 5.9 (H) 05/05/2018   HGBA1C 5.4 04/19/2015   HGBA1C 5.7 02/15/2013   HGBA1C 5.4 12/03/2010   HGBA1C 5.3 09/28/2008   Lab Results  Component Value Date   INSULIN 18.3 05/05/2018   CBC    Component Value Date/Time   WBC 9.2 09/03/2018 0929   RBC 4.50 09/03/2018 0929   HGB 12.5 09/03/2018 0929   HGB 12.8 05/05/2018 1252   HCT 38.8 09/03/2018 0929   HCT 40.4 05/05/2018 1252    PLT 291.0 09/03/2018 0929   MCV 86.3 09/03/2018 0929   MCV 86 05/05/2018 1252   MCH 27.1 05/05/2018 1252   MCH 27.5 11/02/2017 0839   MCHC 32.2 09/03/2018 0929   RDW 15.5 09/03/2018 0929   RDW 13.6 05/05/2018 1252   LYMPHSABS 1.9 09/03/2018 0929   LYMPHSABS 2.1 05/05/2018 1252   MONOABS 0.6 09/03/2018 0929   EOSABS 0.1 09/03/2018 0929   EOSABS 0.1 05/05/2018 1252   BASOSABS 0.0 09/03/2018 0929   BASOSABS 0.0 05/05/2018 1252   Iron/TIBC/Ferritin/ %Sat No results found for: IRON, TIBC, FERRITIN, IRONPCTSAT Lipid Panel     Component Value Date/Time   CHOL 148 09/03/2018 0929   CHOL 160 05/05/2018 1252   TRIG 74.0 09/03/2018 0929   HDL 45.50 09/03/2018 0929   HDL 54 05/05/2018 1252   CHOLHDL 3 09/03/2018 0929   VLDL 14.8 09/03/2018 0929   LDLCALC 87 09/03/2018 0929   LDLCALC 93 05/05/2018 1252   LDLCALC 129 (H) 02/19/2017 0851   Hepatic Function Panel     Component Value Date/Time   PROT 7.2 09/03/2018 0929   PROT 7.0 05/05/2018 1252   ALBUMIN 4.2 09/03/2018 0929   ALBUMIN 4.1 05/05/2018 1252   AST 16 09/03/2018 0929   ALT 20 09/03/2018 0929   ALKPHOS 131 (H) 09/03/2018 0929   BILITOT 0.5 09/03/2018 0929   BILITOT 0.4 05/05/2018 1252   BILIDIR 0.1 04/20/2014 0804      Component Value Date/Time   TSH 2.31 09/03/2018 0929   TSH 1.250 05/05/2018 1252   TSH 1.94 06/08/2017 1343      I, Trixie Dredge, am acting as transcriptionist for Ilene Qua, MD  I have reviewed the above documentation for accuracy and completeness, and I agree with the above. - Ilene Qua, MD

## 2018-09-28 NOTE — Progress Notes (Signed)
Office: [phone]  /  Fax: [phone]    Date: September 30, 2018   Appointment Start Time: 8:30am Duration: 32 minutes Provider: Glennie Isle, Psy.D. Type of Session: Individual Therapy  Location of Patient: Home Location of Provider: Provider's Home Type of Contact: Telepsychological Visit via [employer] WebEx   Session Content: Cheryl Owens is a 53 y.o. female presenting via [place] for a follow-up appointment to address the previously established treatment goal of decreasing emotional eating. Today's appointment was a telepsychological visit, as this provider's clinic is seeing a limited number of patients for in-person visits due to COVID-19. Therapeutic services will resume to in-person appointments once deemed appropriate. Cheryl Owens expressed understanding regarding the rationale for telepsychological services, and provided verbal consent for today's appointment. Prior to proceeding with today's appointment, Cheryl Owens's physical location at the time of this appointment was obtained. Cheryl Owens reported she was at home and provided the address. In the event of technical difficulties, Cheryl Owens shared a phone number she could be reached at. [name] and this provider participated in today's telepsychological service. Also, Cheryl Owens denied anyone else being present in the room or on the WebEx appointment.  This provider conducted a brief check-in and verbally administered the PHQ-9 and GAD-7. Cheryl Owens shared she has an appointment on October 11, 2018 based on the referral placed by this provider. As such, Cheryl Owens requested to terminate services with this provider today and noted, "I think I"m ready to switch over." She indicated meeting with Dr.Kadolph recently and Dr. Adair Patter reportedly indicated it would be "okay" for Cheryl Owens to engage in intermittent fasting. Additionally, Cheryl Owens discussed a plan to join a competition via work that includes walking to help increase her physical activity. Positive reinforcement was provided. However, she  indicated her sleep routine has impacted her ability to wake up in the morning to go for walks resulting in "disappointment." This provider discussed the importance of routine as it can impact various aspects of life, such as sleep, eating, and exercise, etc. Regarding eating out, Cheryl Owens shared she was unable to achieve her goal, but acknowledged increased awareness of the frequency of eating out. This was explored. She disclosed eating out 4 days a week equaling to 7 to 8 meals per week. Thus, the goal was re-evaluated and a goal to eat no more than 4 to 5 meals out per week was established. She noted, "I think I can do that. I think I was too naive." Due to sleep concerns, psychoeducation regarding sleep hygiene was provided. Cheryl Owens provided verbal consent during today's appointment for this provider to send the handout for sleep hygiene via e-mail. Overall, Cheryl Owens was receptive to today's session as evidenced by openness to sharing, responsiveness to feedback, and implement discussed strategieis.  Mental Status Examination:  Appearance: neat Behavior: cooperative Mood: euthymic Affect: mood congruent Speech: normal in rate, volume, and tone Eye Contact: appropriate Psychomotor Activity: appropriate Thought Process: linear, logical, and goal directed  Content/Perceptual Disturbances: denies suicidal and homicidal ideation, plan, and intent and no hallucinations, delusions, bizarre thinking or behavior reported or observed Orientation: time, person, place and purpose of appointment Cognition/Sensorium: memory, attention, language, and fund of knowledge intact  Insight: good Judgment: good  Structured Assessment Results: The Patient Health Questionnaire-9 (PHQ-9) is a self-report measure that assesses symptoms and severity of depression over the course of the last two weeks. Cheryl Owens obtained a score of 4 suggesting minimal depression. Cheryl Owens finds the endorsed symptoms to be not difficult at all. Cheryl Owens  interest or pleasure in doing things 0  Feeling down, depressed, or hopeless 1  Trouble falling or staying asleep, or sleeping too much 1  Feeling tired or having Cheryl Owens energy 0  Poor appetite or overeating 1  Feeling bad about yourself --- or that you are a failure or have let yourself or your family down 1  Trouble concentrating on things, such as reading the newspaper or watching television 0  Moving or speaking so slowly that other people could have noticed? Or the opposite --- being so fidgety or restless that you have been moving around a lot more than usual 0  Thoughts that you would be better off dead or hurting yourself in some way 0  PHQ-9 Score 4    The Generalized Anxiety Disorder-7 (GAD-7) is a brief self-report measure that assesses symptoms of anxiety over the course of the last two weeks. Cheryl Owens obtained a score of 1 suggesting minimal anxiety. Cheryl Owens finds the endorsed symptoms to be not difficult at all. Feeling nervous, anxious, on edge 0  Not being able to stop or control worrying 0  Worrying too much about different things 0  Trouble relaxing 0  Being so restless that it's hard to sit still 0  Becoming easily annoyed or irritable 0  Feeling afraid as if something awful might happen 1  GAD-7 Score 1   Interventions:  Conducted a brief chart review Verbal administration of PHQ-9 and GAD-7 for symptom monitoring Provided empathic reflections and validation Reviewed content from the previous session Engaged patient in goal setting Psychoeducation provided regarding sleep hygiene Provided positive reinforcement Employed supportive psychotherapy interventions to facilitate reduced distress, and to improve coping skills with identified stressors  DSM-5 Diagnosis: 311 (F32.8) Other Specified Depressive Disorder, Emotional Eating Behaviors  Treatment Goal & Progress: During the initial appointment with this provider, the following treatment goal was established: decrease  emotional eating. [name] has demonstrated progress in her goal as evidenced by increased awareness of hunger patterns and triggers for emotional eating. Despite eating out and deviations from the recommended food intake, Islah continues to demonstrate willingness to explore obstacles and engage in learned skills to assist with coping.   Plan: Per Lucita Ferrara' request, today was the last appointment with this provider.

## 2018-09-30 ENCOUNTER — Other Ambulatory Visit: Payer: Self-pay

## 2018-09-30 ENCOUNTER — Ambulatory Visit (INDEPENDENT_AMBULATORY_CARE_PROVIDER_SITE_OTHER): Payer: BC Managed Care – PPO | Admitting: Psychology

## 2018-09-30 DIAGNOSIS — F3289 Other specified depressive episodes: Secondary | ICD-10-CM

## 2018-10-03 ENCOUNTER — Other Ambulatory Visit: Payer: Self-pay | Admitting: Psychiatry

## 2018-10-03 DIAGNOSIS — F320 Major depressive disorder, single episode, mild: Secondary | ICD-10-CM

## 2018-10-04 ENCOUNTER — Encounter (INDEPENDENT_AMBULATORY_CARE_PROVIDER_SITE_OTHER): Payer: Self-pay | Admitting: Family Medicine

## 2018-10-04 ENCOUNTER — Ambulatory Visit (INDEPENDENT_AMBULATORY_CARE_PROVIDER_SITE_OTHER): Payer: BC Managed Care – PPO | Admitting: Family Medicine

## 2018-10-04 ENCOUNTER — Other Ambulatory Visit: Payer: Self-pay

## 2018-10-04 VITALS — BP 106/72 | HR 65 | Temp 98.2°F | Ht 67.0 in | Wt 227.0 lb

## 2018-10-04 DIAGNOSIS — R7303 Prediabetes: Secondary | ICD-10-CM | POA: Diagnosis not present

## 2018-10-04 DIAGNOSIS — E559 Vitamin D deficiency, unspecified: Secondary | ICD-10-CM

## 2018-10-04 DIAGNOSIS — Z6835 Body mass index (BMI) 35.0-35.9, adult: Secondary | ICD-10-CM

## 2018-10-05 NOTE — Progress Notes (Signed)
Office: [phone]  /  Fax: [phone]   HPI:   Chief Complaint: OBESITY Cheryl Owens is here to discuss her progress with her obesity treatment plan. She is on the Category 3 plan and is following her eating plan approximately 50 to 60 % of the time. She states she is walking 1 mile 3 times per week. Cheryl Owens is down 18 pounds since the start of the pandemic. She started an exercise routine, but she has struggled with maintaining a regimen, secondary to heat. She has been using the just crack an egg for breakfast. And she is doing fiber one or protein bars. Her weight is 227 lb (103 kg) today and has had a weight loss of 18 pounds since her last in-office visit. She has lost 25 lbs since starting treatment with [place].  Pre-Diabetes Cheryl Owens has a diagnosis of prediabetes based on her elevated Hgb A1c and was informed this puts her at greater risk of developing diabetes. Her last A1c was at 5.9 and last insulin level was at 26.0 She is not taking metformin currently and continues to work on diet and exercise to decrease risk of diabetes. She is still experiencing significant cravings.  Vitamin D deficiency Cheryl Owens has a diagnosis of vitamin D deficiency. She is currently taking vit D. Cheryl Owens admits fatigue and denies nausea, vomiting or muscle weakness.  ASSESSMENT AND PLAN:  Prediabetes  Vitamin D deficiency  Class 2 severe obesity with serious comorbidity and body mass index (BMI) of 35.0 to 35.9 in adult, unspecified obesity type [facility])  PLAN:  Pre-Diabetes Cheryl Owens will continue to work on weight loss, exercise, and decreasing simple carbohydrates in her diet to help decrease the risk of diabetes. She was informed that eating too many simple carbohydrates or too many calories at one sitting increases the likelihood of GI side effects. If weight plateaus, we will consider the addition of metformin. Cheryl Owens agreed to follow up with [place] as directed to monitor her progress.  Vitamin D Deficiency Cheryl Owens was  informed that low vitamin D levels contributes to fatigue and are associated with obesity, breast, and colon cancer. She will continue to take prescription Vit D @50 ,000 IU every week (no refill needed) and will follow up for routine testing of vitamin D, at least 2-3 times per year. She was informed of the risk of over-replacement of vitamin D and agrees to not increase her dose unless she discusses this with [place] first.  Obesity Cheryl Owens is currently in the action stage of change. As such, her goal is to continue with weight loss efforts She has agreed to keep a food journal with 450 to 600 calories and 40+ grams of protein at supper daily and follow the Category 3 plan Cheryl Owens has been instructed to work up to a goal of 150 minutes of combined cardio and strengthening exercise per week for weight loss and overall health benefits. We discussed the following Behavioral Modification Strategies today: planning for success, keeping healthy foods in the home, better snacking choices, increasing lean protein intake, increasing vegetables and work on meal planning and easy cooking plans  Cheryl Owens has agreed to follow up with our clinic in 2 weeks. She was informed of the importance of frequent follow up visits to maximize her success with intensive lifestyle modifications for her multiple health conditions.  ALLERGIES: Allergies  Allergen Reactions  . Hydrochlorothiazide     REACTION: hypokalemia  . Benazepril     Nausea& vomiting, abd cramps    MEDICATIONS: Current Outpatient Medications  on File Prior to Visit  Medication Sig Dispense Refill  . atorvastatin (LIPITOR) 20 MG tablet TAKE 1 TABLET DAILY 90 tablet 3  . buPROPion (WELLBUTRIN XL) 300 MG 24 hr tablet TAKE 1 TABLET EVERY MORNING 90 tablet 3  . clonazePAM (KLONOPIN) 0.5 MG tablet TAKE 1/2 TABLET IN THE MORNING AND 2 AT BEDTIME 225 tablet 0  . escitalopram (LEXAPRO) 10 MG tablet TAKE 1 TABLET DAILY 90 tablet 3  . Cheryl Owens 0.35 MG tablet TAKE 1 TABLET  DAILY 84 tablet 0  . labetalol (NORMODYNE) 100 MG tablet Take 1 tablet (100 mg total) by mouth 2 (two) times daily. 180 tablet 3  . spironolactone (ALDACTONE) 25 MG tablet TAKE 1 TABLET TWICE A DAY 180 tablet 3  . Vitamin D, Ergocalciferol, (DRISDOL) 1.25 MG (50000 UT) CAPS capsule Take 1 capsule (50,000 Units total) by mouth every 7 (seven) days. 4 capsule 0   No current facility-administered medications on file prior to visit.     PAST MEDICAL HISTORY: Past Medical History:  Diagnosis Date  . Anxiety   . Depression   . Dry mouth   . Fibroids   . Gallbladder disease   . High cholesterol   . HTN (hypertension) 2007   seen in ER  with headaches  . Hypertension   . Hypokalemia 2007   due to HCTZ  . Nervousness   . Obesity   . Stress   . Swelling of lower extremity   . Trouble in sleeping     PAST SURGICAL HISTORY: Past Surgical History:  Procedure Laterality Date  . arm surgery     post trauma @ age 11  . BREAST SURGERY Right 2016   BREAST BX   . CHOLECYSTECTOMY     gall stones  . WISDOM TOOTH EXTRACTION      SOCIAL HISTORY: Social History   Tobacco Use  . Smoking status: Never Smoker  . Smokeless tobacco: Never Used  Substance Use Topics  . Alcohol use: No  . Drug use: No    FAMILY HISTORY: Family History  Problem Relation Age of Onset  . Cancer Maternal Grandmother        thyroid  . Hypertension Maternal Grandmother   . Hyperlipidemia Mother   . Hypertension Mother   . Depression Mother   . Anxiety disorder Mother   . Obesity Mother   . Breast cancer Maternal Aunt 51  . Kidney disease Maternal Aunt         X 2; 1 on Dialysis  . Cancer Maternal Aunt        ? primary; also had HTN  . Prostate cancer Paternal Uncle          3 uncles  . Prostate cancer Father   . Cancer Father   . Hypertension Paternal Grandmother   . Hypertension Maternal Aunt        X 2  . Bipolar disorder Paternal Aunt   . Stroke Neg Hx   . Diabetes Neg Hx   . Heart disease  Neg Hx   . Colon cancer Neg Hx     ROS: Review of Systems  Constitutional: Positive for malaise/fatigue and weight loss.  Gastrointestinal: Negative for nausea and vomiting.  Musculoskeletal:       Negative for muscle weakness  Endo/Heme/Allergies:       Positive for cravings    PHYSICAL EXAM: Blood pressure 106/72, pulse 65, temperature 98.2 F (36.8 C), temperature source Oral, height 5\' 7"  (1.702 m), weight 227  lb (103 kg), SpO2 99 %. Body mass index is 35.55 kg/m. Physical Exam Vitals signs reviewed.  Constitutional:      Appearance: Normal appearance. She is well-developed. She is obese.  Cardiovascular:     Rate and Rhythm: Normal rate.  Pulmonary:     Effort: Pulmonary effort is normal.  Musculoskeletal: Normal range of motion.  Skin:    General: Skin is warm and dry.  Neurological:     Mental Status: She is alert and oriented to person, place, and time.  Psychiatric:        Mood and Affect: Mood normal.        Behavior: Behavior normal.     RECENT LABS AND TESTS: BMET    Component Value Date/Time   NA 138 09/03/2018 0929   NA 142 05/05/2018 1252   K 4.6 09/03/2018 0929   CL 105 09/03/2018 0929   CO2 24 09/03/2018 0929   GLUCOSE 92 09/03/2018 0929   BUN 19 09/03/2018 0929   BUN 12 05/05/2018 1252   CREATININE 1.34 (H) 09/03/2018 0929   CREATININE 1.28 (H) 02/19/2017 0851   CALCIUM 9.7 09/03/2018 0929   GFRNONAA 57 (L) 05/05/2018 1252   GFRAA 65 05/05/2018 1252   Lab Results  Component Value Date   HGBA1C 5.9 (H) 05/05/2018   HGBA1C 5.4 04/19/2015   HGBA1C 5.7 02/15/2013   HGBA1C 5.4 12/03/2010   HGBA1C 5.3 09/28/2008   Lab Results  Component Value Date   INSULIN 18.3 05/05/2018   CBC    Component Value Date/Time   WBC 9.2 09/03/2018 0929   RBC 4.50 09/03/2018 0929   HGB 12.5 09/03/2018 0929   HGB 12.8 05/05/2018 1252   HCT 38.8 09/03/2018 0929   HCT 40.4 05/05/2018 1252   PLT 291.0 09/03/2018 0929   MCV 86.3 09/03/2018 0929   MCV  86 05/05/2018 1252   MCH 27.1 05/05/2018 1252   MCH 27.5 11/02/2017 0839   MCHC 32.2 09/03/2018 0929   RDW 15.5 09/03/2018 0929   RDW 13.6 05/05/2018 1252   LYMPHSABS 1.9 09/03/2018 0929   LYMPHSABS 2.1 05/05/2018 1252   MONOABS 0.6 09/03/2018 0929   EOSABS 0.1 09/03/2018 0929   EOSABS 0.1 05/05/2018 1252   BASOSABS 0.0 09/03/2018 0929   BASOSABS 0.0 05/05/2018 1252   Iron/TIBC/Ferritin/ %Sat No results found for: IRON, TIBC, FERRITIN, IRONPCTSAT Lipid Panel     Component Value Date/Time   CHOL 148 09/03/2018 0929   CHOL 160 05/05/2018 1252   TRIG 74.0 09/03/2018 0929   HDL 45.50 09/03/2018 0929   HDL 54 05/05/2018 1252   CHOLHDL 3 09/03/2018 0929   VLDL 14.8 09/03/2018 0929   LDLCALC 87 09/03/2018 0929   LDLCALC 93 05/05/2018 1252   LDLCALC 129 (H) 02/19/2017 0851   Hepatic Function Panel     Component Value Date/Time   PROT 7.2 09/03/2018 0929   PROT 7.0 05/05/2018 1252   ALBUMIN 4.2 09/03/2018 0929   ALBUMIN 4.1 05/05/2018 1252   AST 16 09/03/2018 0929   ALT 20 09/03/2018 0929   ALKPHOS 131 (H) 09/03/2018 0929   BILITOT 0.5 09/03/2018 0929   BILITOT 0.4 05/05/2018 1252   BILIDIR 0.1 04/20/2014 0804      Component Value Date/Time   TSH 2.31 09/03/2018 0929   TSH 1.250 05/05/2018 1252   TSH 1.94 06/08/2017 1343     Ref. Range 05/05/2018 12:52  Vitamin D, 25-Hydroxy Latest Ref Range: 30.0 - 100.0 ng/mL 15.5 (L)    OBESITY BEHAVIORAL  INTERVENTION VISIT  Today's visit was # 11   Starting weight: 252 lbs Starting date: 05/05/2018 Today's weight : 227 lbs Today's date: 10/04/2018 Total lbs lost to date: 25    10/04/2018  Height 5\' 7"  (1.702 m)  Weight 227 lb (103 kg)  BMI (Calculated) 35.55  BLOOD PRESSURE - SYSTOLIC 735  BLOOD PRESSURE - DIASTOLIC 72   Body Fat % 32.9 %  Total Body Water (lbs) 86.2 lbs    ASK: We discussed the diagnosis of obesity with Cheryl Owens today and Cheryl Owens agreed to give [place] permission to discuss obesity behavioral  modification therapy today.  ASSESS: Cheryl Owens has the diagnosis of obesity and her BMI today is 35.55 Cheryl Owens is in the action stage of change   ADVISE: Cheryl Owens was educated on the multiple health risks of obesity as well as the benefit of weight loss to improve her health. She was advised of the need for long term treatment and the importance of lifestyle modifications to improve her current health and to decrease her risk of future health problems.  AGREE: Multiple dietary modification options and treatment options were discussed and  Cheryl Owens agreed to follow the recommendations documented in the above note.  ARRANGE: Cheryl Owens was educated on the importance of frequent visits to treat obesity as outlined per CMS and USPSTF guidelines and agreed to schedule her next follow up appointment today.  I, Doreene Nest, am acting as transcriptionist for Eber Jones, MD  I have reviewed the above documentation for accuracy and completeness, and I agree with the above. - Ilene Qua, MD

## 2018-10-11 ENCOUNTER — Ambulatory Visit (INDEPENDENT_AMBULATORY_CARE_PROVIDER_SITE_OTHER): Payer: BC Managed Care – PPO | Admitting: Psychology

## 2018-10-11 DIAGNOSIS — F339 Major depressive disorder, recurrent, unspecified: Secondary | ICD-10-CM

## 2018-10-12 ENCOUNTER — Other Ambulatory Visit: Payer: Self-pay | Admitting: Obstetrics & Gynecology

## 2018-10-19 ENCOUNTER — Other Ambulatory Visit: Payer: Self-pay

## 2018-10-19 ENCOUNTER — Ambulatory Visit (INDEPENDENT_AMBULATORY_CARE_PROVIDER_SITE_OTHER): Payer: BC Managed Care – PPO | Admitting: Family Medicine

## 2018-10-19 ENCOUNTER — Encounter (INDEPENDENT_AMBULATORY_CARE_PROVIDER_SITE_OTHER): Payer: Self-pay | Admitting: Family Medicine

## 2018-10-19 VITALS — BP 99/66 | HR 65 | Temp 98.5°F | Ht 67.0 in | Wt 228.0 lb

## 2018-10-19 DIAGNOSIS — Z6835 Body mass index (BMI) 35.0-35.9, adult: Secondary | ICD-10-CM

## 2018-10-19 DIAGNOSIS — I10 Essential (primary) hypertension: Secondary | ICD-10-CM

## 2018-10-19 DIAGNOSIS — Z9189 Other specified personal risk factors, not elsewhere classified: Secondary | ICD-10-CM

## 2018-10-19 DIAGNOSIS — E559 Vitamin D deficiency, unspecified: Secondary | ICD-10-CM | POA: Diagnosis not present

## 2018-10-19 MED ORDER — VITAMIN D (ERGOCALCIFEROL) 1.25 MG (50000 UNIT) PO CAPS: 50000.0000 [IU] | ORAL_CAPSULE | ORAL | 0 refills | Status: DC

## 2018-10-21 NOTE — Progress Notes (Signed)
Office: [phone]  /  Fax: [phone]   HPI:   Chief Complaint: OBESITY Cheryl Owens is here to discuss her progress with her obesity treatment plan. She is on the Category 3 plan and is following her eating plan approximately 50% of the time. She states she is walking 60 minutes 3 times per week. Cheryl Owens has struggled to stay on the meal plan over the last 2 weeks. She has been trying to do some intermittent fasting and voices she isn't getting all nutrients/calories/protein in and is struggling with motivation.  Her weight is 228 lb (103.4 kg) today and has had a weight gain of 1 lb since her last visit. She has lost 24 lbs since starting treatment with [place].  Vitamin D deficiency Cheryl Owens has a diagnosis of Vitamin D deficiency. She is currently taking prescription Vit D and denies nausea, vomiting or muscle weakness but does report fatigue.  At risk for osteopenia and osteoporosis Cheryl Owens is at higher risk of osteopenia and osteoporosis due to Vitamin D deficiency.   Hypertension Cheryl Owens is a 53 y.o. female with hypertension. She recently decreased spironolactone secondary to a slightly elevated creatinine.  Cheryl Owens denies chest pain or shortness of breath on exertion. She is working weight loss to help control her blood pressure with the goal of decreasing her risk of heart attack and stroke. Cheryl Owens' blood pressure is controlled.  ASSESSMENT AND PLAN:  Vitamin D deficiency - Plan: Vitamin D, Ergocalciferol, (DRISDOL) 1.25 MG (50000 UT) CAPS capsule  PLAN:  Vitamin D Deficiency Cheryl Owens was informed that low Vitamin D levels contributes to fatigue and are associated with obesity, breast, and colon cancer. She agrees to continue to take prescription Vit D @ 50,000 IU every week #12 with 0 refills and will follow-up for routine testing of Vitamin D, at least 2-3 times per year. She was informed of the risk of over-replacement of Vitamin D and agrees to not increase her dose unless she discusses  this with [place] first. Cheryl Owens agrees to follow-up with our clinic in 2 weeks.  At risk for osteopenia and osteoporosis Cheryl Owens was given extended  (15 minutes) osteoporosis prevention counseling today. Cheryl Owens is at risk for osteopenia and osteoporosis due to her Vitamin D deficiency. She was encouraged to take her Vitamin D and follow her higher calcium diet and increase strengthening exercise to help strengthen her bones and decrease her risk of osteopenia and osteoporosis.  Hypertension We discussed sodium restriction, working on healthy weight loss, and a regular exercise program as the means to achieve improved blood pressure control. Cheryl Owens agreed with this plan and agreed to follow up as directed. We will continue to monitor her blood pressure as well as her progress with the above lifestyle modifications. She will continue her medications as prescribed and will watch for signs of hypotension as she continues her lifestyle modifications. Cheryl Owens will have follow-up labs per Dr. [name].  Obesity Cheryl Owens is currently in the action stage of change. As such, her goal is to continue with weight loss efforts. She has agreed to follow the Category 3 plan. Cheryl Owens will plan to increase water consumption and eat breakfast 4 times per week. Cheryl Owens has been instructed to work up to a goal of 150 minutes of combined cardio and strengthening exercise per week for weight loss and overall health benefits. We discussed the following Behavioral Modification Strategies today: increasing lean protein intake, increasing vegetables, work on meal planning and easy cooking plans, keeping healthy foods  in the home, and planning for success.  Cheryl Owens has agreed to follow-up with our clinic in 2 weeks. She was informed of the importance of frequent follow-up visits to maximize her success with intensive lifestyle modifications for her multiple health conditions.  ALLERGIES: Allergies  Allergen Reactions   Hydrochlorothiazide      REACTION: hypokalemia   Benazepril     Nausea& vomiting, abd cramps    MEDICATIONS: Current Outpatient Medications on File Prior to Visit  Medication Sig Dispense Refill   atorvastatin (LIPITOR) 20 MG tablet TAKE 1 TABLET DAILY 90 tablet 3   buPROPion (WELLBUTRIN XL) 300 MG 24 hr tablet TAKE 1 TABLET EVERY MORNING 90 tablet 3   clonazePAM (KLONOPIN) 0.5 MG tablet TAKE 1/2 TABLET IN THE MORNING AND 2 AT BEDTIME 225 tablet 0   escitalopram (LEXAPRO) 10 MG tablet TAKE 1 TABLET DAILY 90 tablet 3   Cheryl Owens 0.35 MG tablet TAKE 1 TABLET DAILY 84 tablet 3   labetalol (NORMODYNE) 100 MG tablet Take 1 tablet (100 mg total) by mouth 2 (two) times daily. 180 tablet 3   spironolactone (ALDACTONE) 25 MG tablet TAKE 1 TABLET TWICE A DAY 180 tablet 3   No current facility-administered medications on file prior to visit.     PAST MEDICAL HISTORY: Past Medical History:  Diagnosis Date   Anxiety    Depression    Dry mouth    Fibroids    Gallbladder disease    High cholesterol    HTN (hypertension) 2007   seen in ER  with headaches   Hypertension    Hypokalemia 2007   due to HCTZ   Nervousness    Obesity    Stress    Swelling of lower extremity    Trouble in sleeping     PAST SURGICAL HISTORY: Past Surgical History:  Procedure Laterality Date   arm surgery     post trauma @ age 50   BREAST SURGERY Right 2016   BREAST BX    CHOLECYSTECTOMY     gall stones   WISDOM TOOTH EXTRACTION      SOCIAL HISTORY: Social History   Tobacco Use   Smoking status: Never Smoker   Smokeless tobacco: Never Used  Substance Use Topics   Alcohol use: No   Drug use: No    FAMILY HISTORY: Family History  Problem Relation Age of Onset   Cancer Maternal Grandmother        thyroid   Hypertension Maternal Grandmother    Hyperlipidemia Mother    Hypertension Mother    Depression Mother    Anxiety disorder Mother    Obesity Mother    Breast cancer Maternal  Aunt 39   Kidney disease Maternal Aunt         X 2; 1 on Dialysis   Cancer Maternal Aunt        ? primary; also had HTN   Prostate cancer Paternal Uncle          3 uncles   Prostate cancer Father    Cancer Father    Hypertension Paternal Grandmother    Hypertension Maternal Aunt        X 2   Bipolar disorder Paternal Aunt    Stroke Neg Hx    Diabetes Neg Hx    Heart disease Neg Hx    Colon cancer Neg Hx    ROS: Review of Systems  Constitutional: Positive for malaise/fatigue.  Gastrointestinal: Negative for nausea and vomiting.  Musculoskeletal:  Negative for muscle weakness.   PHYSICAL EXAM: Blood pressure 99/66, pulse 65, temperature 98.5 F (36.9 C), temperature source Oral, height 5\' 7"  (1.702 m), weight 228 lb (103.4 kg), SpO2 99 %. Body mass index is 35.71 kg/m. Physical Exam Vitals signs reviewed.  Constitutional:      Appearance: Normal appearance. She is obese.  Cardiovascular:     Rate and Rhythm: Normal rate.     Pulses: Normal pulses.  Pulmonary:     Effort: Pulmonary effort is normal.     Breath sounds: Normal breath sounds.  Musculoskeletal: Normal range of motion.  Skin:    General: Skin is warm and dry.  Neurological:     Mental Status: She is alert and oriented to person, place, and time.  Psychiatric:        Behavior: Behavior normal.   RECENT LABS AND TESTS: BMET    Component Value Date/Time   NA 138 09/03/2018 0929   NA 142 05/05/2018 1252   K 4.6 09/03/2018 0929   CL 105 09/03/2018 0929   CO2 24 09/03/2018 0929   GLUCOSE 92 09/03/2018 0929   BUN 19 09/03/2018 0929   BUN 12 05/05/2018 1252   CREATININE 1.34 (H) 09/03/2018 0929   CREATININE 1.28 (H) 02/19/2017 0851   CALCIUM 9.7 09/03/2018 0929   GFRNONAA 57 (L) 05/05/2018 1252   GFRAA 65 05/05/2018 1252   Lab Results  Component Value Date   HGBA1C 5.9 (H) 05/05/2018   HGBA1C 5.4 04/19/2015   HGBA1C 5.7 02/15/2013   HGBA1C 5.4 12/03/2010   HGBA1C 5.3 09/28/2008    Lab Results  Component Value Date   INSULIN 18.3 05/05/2018   CBC    Component Value Date/Time   WBC 9.2 09/03/2018 0929   RBC 4.50 09/03/2018 0929   HGB 12.5 09/03/2018 0929   HGB 12.8 05/05/2018 1252   HCT 38.8 09/03/2018 0929   HCT 40.4 05/05/2018 1252   PLT 291.0 09/03/2018 0929   MCV 86.3 09/03/2018 0929   MCV 86 05/05/2018 1252   MCH 27.1 05/05/2018 1252   MCH 27.5 11/02/2017 0839   MCHC 32.2 09/03/2018 0929   RDW 15.5 09/03/2018 0929   RDW 13.6 05/05/2018 1252   LYMPHSABS 1.9 09/03/2018 0929   LYMPHSABS 2.1 05/05/2018 1252   MONOABS 0.6 09/03/2018 0929   EOSABS 0.1 09/03/2018 0929   EOSABS 0.1 05/05/2018 1252   BASOSABS 0.0 09/03/2018 0929   BASOSABS 0.0 05/05/2018 1252   Iron/TIBC/Ferritin/ %Sat No results found for: IRON, TIBC, FERRITIN, IRONPCTSAT Lipid Panel     Component Value Date/Time   CHOL 148 09/03/2018 0929   CHOL 160 05/05/2018 1252   TRIG 74.0 09/03/2018 0929   HDL 45.50 09/03/2018 0929   HDL 54 05/05/2018 1252   CHOLHDL 3 09/03/2018 0929   VLDL 14.8 09/03/2018 0929   LDLCALC 87 09/03/2018 0929   LDLCALC 93 05/05/2018 1252   LDLCALC 129 (H) 02/19/2017 0851   Hepatic Function Panel     Component Value Date/Time   PROT 7.2 09/03/2018 0929   PROT 7.0 05/05/2018 1252   ALBUMIN 4.2 09/03/2018 0929   ALBUMIN 4.1 05/05/2018 1252   AST 16 09/03/2018 0929   ALT 20 09/03/2018 0929   ALKPHOS 131 (H) 09/03/2018 0929   BILITOT 0.5 09/03/2018 0929   BILITOT 0.4 05/05/2018 1252   BILIDIR 0.1 04/20/2014 0804      Component Value Date/Time   TSH 2.31 09/03/2018 0929   TSH 1.250 05/05/2018 1252   TSH 1.94 06/08/2017  1343   Results for SORIYAH, OSBERG" (MRN [id number]) as of 10/21/2018 08:58  Ref. Range 05/05/2018 12:52  Vitamin D, 25-Hydroxy Latest Ref Range: 30.0 - 100.0 ng/mL 15.5 (L)   OBESITY BEHAVIORAL INTERVENTION VISIT  Today's visit was #12   Starting weight: 252 lbs Starting date: 05/05/2018 Today's weight: 228 lbs    Today's date: 10/19/2018 Total lbs lost to date: 24    10/19/2018  Height 5\' 7"  (0.370 m)  Weight 228 lb (103.4 kg)  BMI (Calculated) 35.7  BLOOD PRESSURE - SYSTOLIC 99  BLOOD PRESSURE - DIASTOLIC 66   Body Fat % 48.8 %  Total Body Water (lbs) 86.2 lbs   ASK: We discussed the diagnosis of obesity with Cheryl Owens today and Cheryl Owens agreed to give [place] permission to discuss obesity behavioral modification therapy today.  ASSESS: Cheryl Owens has the diagnosis of obesity and her BMI today is 35.7. Cheryl Owens is in the action stage of change.   ADVISE: Cheryl Owens was educated on the multiple health risks of obesity as well as the benefit of weight loss to improve her health. She was advised of the need for long term treatment and the importance of lifestyle modifications to improve her current health and to decrease her risk of future health problems.  AGREE: Multiple dietary modification options and treatment options were discussed and  Cheryl Owens agreed to follow the recommendations documented in the above note.  ARRANGE: Cheryl Owens was educated on the importance of frequent visits to treat obesity as outlined per CMS and USPSTF guidelines and agreed to schedule her next follow up appointment today.  I, Michaelene Song, am acting as transcriptionist for Ilene Qua, MD  I have reviewed the above documentation for accuracy and completeness, and I agree with the above. - Ilene Qua, MD

## 2018-10-27 ENCOUNTER — Other Ambulatory Visit: Payer: Self-pay

## 2018-10-27 MED ORDER — CLONAZEPAM 0.5 MG PO TABS: ORAL_TABLET | ORAL | 0 refills | Status: DC

## 2018-11-04 ENCOUNTER — Ambulatory Visit (INDEPENDENT_AMBULATORY_CARE_PROVIDER_SITE_OTHER): Payer: BC Managed Care – PPO | Admitting: Psychology

## 2018-11-04 ENCOUNTER — Other Ambulatory Visit: Payer: Self-pay

## 2018-11-04 ENCOUNTER — Telehealth (INDEPENDENT_AMBULATORY_CARE_PROVIDER_SITE_OTHER): Payer: BC Managed Care – PPO | Admitting: Family Medicine

## 2018-11-04 ENCOUNTER — Encounter (INDEPENDENT_AMBULATORY_CARE_PROVIDER_SITE_OTHER): Payer: Self-pay | Admitting: Family Medicine

## 2018-11-04 DIAGNOSIS — Z6835 Body mass index (BMI) 35.0-35.9, adult: Secondary | ICD-10-CM

## 2018-11-04 DIAGNOSIS — E559 Vitamin D deficiency, unspecified: Secondary | ICD-10-CM | POA: Diagnosis not present

## 2018-11-04 DIAGNOSIS — F339 Major depressive disorder, recurrent, unspecified: Secondary | ICD-10-CM

## 2018-11-04 DIAGNOSIS — I10 Essential (primary) hypertension: Secondary | ICD-10-CM

## 2018-11-04 NOTE — Progress Notes (Signed)
Office: [phone]  /  Fax: [phone] TeleHealth Visit:  Cheryl Owens has verbally consented to this TeleHealth visit today. The patient is located at home, the provider is located at the [facility] office. The participants in this visit include the listed provider and patient. The visit was conducted today via Webex.  HPI:   Chief Complaint: OBESITY Cheryl Owens is here to discuss her progress with her obesity treatment plan. She is on the Category 3 plan and is following her eating plan approximately 50% of the time. She states she is walking 3 miles 2 times per week. Cheryl Owens has been drinking approximately 7 cups of water daily, getting breakfast daily, and stopping eating at 8:30 p.m. She hasn't done as well with getting her food in; is getting approximately 1700 calories and only getting approximately 50 grams of protein in. We were unable to weigh the patient today for this TeleHealth visit. She feels as if she has maintained her weight since her last visit. She has lost 24 lbs since starting treatment with [place].  Vitamin D deficiency Cheryl Owens has a diagnosis of Vitamin D deficiency. She is currently taking prescription Vit D and denies nausea, vomiting or muscle weakness but does admit to fatigue.  Essential Hypertension Cheryl Owens is a 53 y.o. female with hypertension.  Cheryl Owens denies chest pain, chest pressure, or headache. She is working weight loss to help control her blood pressure with the goal of decreasing her risk of heart attack and stroke. Cheryl Owens blood pressure is controlled at 119/66.  ASSESSMENT AND PLAN:  Essential hypertension  Vitamin D deficiency  Class 2 severe obesity with serious comorbidity and body mass index (BMI) of 35.0 to 35.9 in adult, unspecified obesity type ([place])  PLAN:  Vitamin D Deficiency Cheryl Owens was informed that low Vitamin D levels contributes to fatigue and are associated with obesity, breast, and colon cancer. She agrees to continue  to take prescription Vit D (no refill needed) and will follow-up for routine testing of Vitamin D, at least 2-3 times per year. She was informed of the risk of over-replacement of Vitamin D and agrees to not increase her dose unless she discusses this with [place] first. Cheryl Owens agrees to follow-up with our clinic in 2 weeks.  Essential Hypertension We discussed sodium restriction, working on healthy weight loss, and a regular exercise program as the means to achieve improved blood pressure control. Cheryl Owens agreed with this plan and agreed to follow up as directed. We will continue to monitor her blood pressure as well as her progress with the above lifestyle modifications. She will continue her medications as prescribed (no refill needed) and will watch for signs of hypotension as she continues her lifestyle modifications.  Obesity Cheryl Owens is currently in the action stage of change. As such, her goal is to continue with weight loss efforts. She has agreed to keep a food journal with 1450-1600 calories and 95+ grams of protein daily. Cheryl Owens has been instructed to work up to a goal of 150 minutes of combined cardio and strengthening exercise per week for weight loss and overall health benefits. We discussed the following Behavioral Modification Strategies today: increasing lean protein intake, increasing vegetables, work on meal planning and easy cooking plans, keeping healthy foods in the home, and planning for success.  Cheryl Owens has agreed to follow-up with our clinic in 2 weeks. She was informed of the importance of frequent follow-up visits to maximize her success with intensive lifestyle modifications for  her multiple health conditions.  ALLERGIES: Allergies  Allergen Reactions  . Hydrochlorothiazide     REACTION: hypokalemia  . Benazepril     Nausea& vomiting, abd cramps    MEDICATIONS: Current Outpatient Medications on File Prior to Visit  Medication Sig Dispense Refill  . atorvastatin (LIPITOR) 20 MG  tablet TAKE 1 TABLET DAILY 90 tablet 3  . buPROPion (WELLBUTRIN XL) 300 MG 24 hr tablet TAKE 1 TABLET EVERY MORNING 90 tablet 3  . clonazePAM (KLONOPIN) 0.5 MG tablet TAKE 1/2 TABLET IN THE MORNING AND 2 AT BEDTIME 225 tablet 0  . escitalopram (LEXAPRO) 10 MG tablet TAKE 1 TABLET DAILY 90 tablet 3  . Cheryl Owens 0.35 MG tablet TAKE 1 TABLET DAILY 84 tablet 3  . labetalol (NORMODYNE) 100 MG tablet Take 1 tablet (100 mg total) by mouth 2 (two) times daily. 180 tablet 3  . spironolactone (ALDACTONE) 25 MG tablet TAKE 1 TABLET TWICE A DAY 180 tablet 3  . Vitamin D, Ergocalciferol, (DRISDOL) 1.25 MG (50000 UT) CAPS capsule Take 1 capsule (50,000 Units total) by mouth every 7 (seven) days. 12 capsule 0   No current facility-administered medications on file prior to visit.     PAST MEDICAL HISTORY: Past Medical History:  Diagnosis Date  . Anxiety   . Depression   . Dry mouth   . Fibroids   . Gallbladder disease   . High cholesterol   . HTN (hypertension) 2007   seen in ER  with headaches  . Hypertension   . Hypokalemia 2007   due to HCTZ  . Nervousness   . Obesity   . Stress   . Swelling of lower extremity   . Trouble in sleeping     PAST SURGICAL HISTORY: Past Surgical History:  Procedure Laterality Date  . arm surgery     post trauma @ age 51  . BREAST SURGERY Right 2016   BREAST BX   . CHOLECYSTECTOMY     gall stones  . WISDOM TOOTH EXTRACTION      SOCIAL HISTORY: Social History   Tobacco Use  . Smoking status: Never Smoker  . Smokeless tobacco: Never Used  Substance Use Topics  . Alcohol use: No  . Drug use: No    FAMILY HISTORY: Family History  Problem Relation Age of Onset  . Cancer Maternal Grandmother        thyroid  . Hypertension Maternal Grandmother   . Hyperlipidemia Mother   . Hypertension Mother   . Depression Mother   . Anxiety disorder Mother   . Obesity Mother   . Breast cancer Maternal Aunt 51  . Kidney disease Maternal Aunt         X 2; 1 on  Dialysis  . Cancer Maternal Aunt        ? primary; also had HTN  . Prostate cancer Paternal Uncle          3 uncles  . Prostate cancer Father   . Cancer Father   . Hypertension Paternal Grandmother   . Hypertension Maternal Aunt        X 2  . Bipolar disorder Paternal Aunt   . Stroke Neg Hx   . Diabetes Neg Hx   . Heart disease Neg Hx   . Colon cancer Neg Hx    ROS: Review of Systems  Constitutional: Positive for malaise/fatigue.  Cardiovascular: Negative for chest pain.       Negative for chest pressure.  Gastrointestinal: Negative for nausea and  vomiting.  Musculoskeletal:       Negative for muscle weakness.  Neurological: Negative for headaches.   PHYSICAL EXAM: Pt in no acute distress  RECENT LABS AND TESTS: BMET    Component Value Date/Time   NA 138 09/03/2018 0929   NA 142 05/05/2018 1252   K 4.6 09/03/2018 0929   CL 105 09/03/2018 0929   CO2 24 09/03/2018 0929   GLUCOSE 92 09/03/2018 0929   BUN 19 09/03/2018 0929   BUN 12 05/05/2018 1252   CREATININE 1.34 (H) 09/03/2018 0929   CREATININE 1.28 (H) 02/19/2017 0851   CALCIUM 9.7 09/03/2018 0929   GFRNONAA 57 (L) 05/05/2018 1252   GFRAA 65 05/05/2018 1252   Lab Results  Component Value Date   HGBA1C 5.9 (H) 05/05/2018   HGBA1C 5.4 04/19/2015   HGBA1C 5.7 02/15/2013   HGBA1C 5.4 12/03/2010   HGBA1C 5.3 09/28/2008   Lab Results  Component Value Date   INSULIN 18.3 05/05/2018   CBC    Component Value Date/Time   WBC 9.2 09/03/2018 0929   RBC 4.50 09/03/2018 0929   HGB 12.5 09/03/2018 0929   HGB 12.8 05/05/2018 1252   HCT 38.8 09/03/2018 0929   HCT 40.4 05/05/2018 1252   PLT 291.0 09/03/2018 0929   MCV 86.3 09/03/2018 0929   MCV 86 05/05/2018 1252   MCH 27.1 05/05/2018 1252   MCH 27.5 11/02/2017 0839   MCHC 32.2 09/03/2018 0929   RDW 15.5 09/03/2018 0929   RDW 13.6 05/05/2018 1252   LYMPHSABS 1.9 09/03/2018 0929   LYMPHSABS 2.1 05/05/2018 1252   MONOABS 0.6 09/03/2018 0929   EOSABS 0.1  09/03/2018 0929   EOSABS 0.1 05/05/2018 1252   BASOSABS 0.0 09/03/2018 0929   BASOSABS 0.0 05/05/2018 1252   Iron/TIBC/Ferritin/ %Sat No results found for: IRON, TIBC, FERRITIN, IRONPCTSAT Lipid Panel     Component Value Date/Time   CHOL 148 09/03/2018 0929   CHOL 160 05/05/2018 1252   TRIG 74.0 09/03/2018 0929   HDL 45.50 09/03/2018 0929   HDL 54 05/05/2018 1252   CHOLHDL 3 09/03/2018 0929   VLDL 14.8 09/03/2018 0929   LDLCALC 87 09/03/2018 0929   LDLCALC 93 05/05/2018 1252   LDLCALC 129 (H) 02/19/2017 0851   Hepatic Function Panel     Component Value Date/Time   PROT 7.2 09/03/2018 0929   PROT 7.0 05/05/2018 1252   ALBUMIN 4.2 09/03/2018 0929   ALBUMIN 4.1 05/05/2018 1252   AST 16 09/03/2018 0929   ALT 20 09/03/2018 0929   ALKPHOS 131 (H) 09/03/2018 0929   BILITOT 0.5 09/03/2018 0929   BILITOT 0.4 05/05/2018 1252   BILIDIR 0.1 04/20/2014 0804      Component Value Date/Time   TSH 2.31 09/03/2018 0929   TSH 1.250 05/05/2018 1252   TSH 1.94 06/08/2017 1343   Results for EMERLYN, ALBERTA "MICHELE" (MRN [id number]) as of 11/04/2018 11:33  Ref. Range 09/03/2018 09:29  VITD Latest Ref Range: 30.00 - 100.00 ng/mL 39.37   I, Michaelene Song, am acting as Location manager for Ilene Qua, MD  I have reviewed the above documentation for accuracy and completeness, and I agree with the above. - Ilene Qua, MD

## 2018-11-22 ENCOUNTER — Ambulatory Visit (INDEPENDENT_AMBULATORY_CARE_PROVIDER_SITE_OTHER): Payer: BC Managed Care – PPO | Admitting: Family Medicine

## 2018-11-22 ENCOUNTER — Encounter (INDEPENDENT_AMBULATORY_CARE_PROVIDER_SITE_OTHER): Payer: Self-pay | Admitting: Family Medicine

## 2018-11-22 ENCOUNTER — Other Ambulatory Visit: Payer: Self-pay

## 2018-11-22 DIAGNOSIS — Z6835 Body mass index (BMI) 35.0-35.9, adult: Secondary | ICD-10-CM

## 2018-11-22 DIAGNOSIS — E559 Vitamin D deficiency, unspecified: Secondary | ICD-10-CM

## 2018-11-22 DIAGNOSIS — R7303 Prediabetes: Secondary | ICD-10-CM

## 2018-11-22 NOTE — Progress Notes (Signed)
Office: [phone]  /  Fax: [phone] TeleHealth Visit:  AYSLINN GROSHEK has verbally consented to this TeleHealth visit today. The patient is located at home, the provider is located at the [employer] [facility] office. The participants in this visit include the listed provider and patient. The visit was conducted today via webex.  HPI:   Chief Complaint: OBESITY Tyrell is here to discuss her progress with her obesity treatment plan. She is on the keep a food journal with 1450-1600 calories and 95+ grams of protein daily and is following her eating plan approximately 50 % of the time. She states she is walking 3 miles 3 times per week. Shonnon reports a weight loss of 1 lb, not weight reported. She is working on getting calories within 1450-1600 and still struggling with protein content. She is trying to do intermittent fasting and finding that she will eat what she wants (which is mostly carbohydrates) instead of protein.  We were unable to weigh the patient today for this TeleHealth visit. She feels as if she has lost 1 lb since her last visit. She has lost 24 lbs since starting treatment with [place].  Pre-Diabetes Ladeidra has a diagnosis of pre-diabetes based on her elevated Hgb A1c and was informed this puts her at greater risk of developing diabetes. Last Hgb A1c was of 5.9 and insulin of 26.0. She is not taking metformin currently and continues to work on diet and exercise to decrease risk of diabetes. She denies hypoglycemia.  Vitamin D Deficiency Katheryn has a diagnosis of vitamin D deficiency. She is currently taking prescription Vit D. Last Vit D level was 39.37 on 09/03/2018. She notes fatigue and denies nausea, vomiting or muscle weakness.  ASSESSMENT AND PLAN:  Prediabetes  Vitamin D deficiency  Class 2 severe obesity with serious comorbidity and body mass index (BMI) of 35.0 to 35.9 in adult, unspecified obesity type [facility])  PLAN:  Pre-Diabetes Jossalynn will continue to work on  weight loss, exercise, and decreasing simple carbohydrates in her diet to help decrease the risk of diabetes. We dicussed metformin including benefits and risks. She was informed that eating too many simple carbohydrates or too many calories at one sitting increases the likelihood of GI side effects. We will repeat labs at her first in office appointment. [place] agrees to follow up with our clinic in 2 weeks as directed to monitor her progress.  Vitamin D Deficiency Inger was informed that low vitamin D levels contributes to fatigue and are associated with obesity, breast, and colon cancer. Casilda agrees to continue taking prescription Vit D 50,000 IU every week, no refill needed. She will follow up for routine testing of vitamin D, at least 2-3 times per year. She was informed of the risk of over-replacement of vitamin D and agrees to not increase her dose unless she discusses this with [place] first. Noble agrees to follow up with our clinic in 2 weeks.  Obesity Elecia is currently in the action stage of change. As such, her goal is to continue with weight loss efforts She has agreed to follow the Category 3 plan Jolett has been instructed to work up to a goal of 150 minutes of combined cardio and strengthening exercise per week for weight loss and overall health benefits. We discussed the following Behavioral Modification Strategies today: increasing lean protein intake, increasing vegetables and work on meal planning and easy cooking plans, keeping healthy foods in the home, and planning for success    [facility]  has agreed to follow up with our clinic in 2 weeks. She was informed of the importance of frequent follow up visits to maximize her success with intensive lifestyle modifications for her multiple health conditions.  ALLERGIES: Allergies  Allergen Reactions  . Hydrochlorothiazide     REACTION: hypokalemia  . Benazepril     Nausea& vomiting, abd cramps    MEDICATIONS: Current Outpatient Medications on  File Prior to Visit  Medication Sig Dispense Refill  . atorvastatin (LIPITOR) 20 MG tablet TAKE 1 TABLET DAILY 90 tablet 3  . buPROPion (WELLBUTRIN XL) 300 MG 24 hr tablet TAKE 1 TABLET EVERY MORNING 90 tablet 3  . clonazePAM (KLONOPIN) 0.5 MG tablet TAKE 1/2 TABLET IN THE MORNING AND 2 AT BEDTIME 225 tablet 0  . escitalopram (LEXAPRO) 10 MG tablet TAKE 1 TABLET DAILY 90 tablet 3  . [name] 0.35 MG tablet TAKE 1 TABLET DAILY 84 tablet 3  . labetalol (NORMODYNE) 100 MG tablet Take 1 tablet (100 mg total) by mouth 2 (two) times daily. 180 tablet 3  . spironolactone (ALDACTONE) 25 MG tablet TAKE 1 TABLET TWICE A DAY 180 tablet 3  . Vitamin D, Ergocalciferol, (DRISDOL) 1.25 MG (50000 UT) CAPS capsule Take 1 capsule (50,000 Units total) by mouth every 7 (seven) days. 12 capsule 0   No current facility-administered medications on file prior to visit.     PAST MEDICAL HISTORY: Past Medical History:  Diagnosis Date  . Anxiety   . Depression   . Dry mouth   . Fibroids   . Gallbladder disease   . High cholesterol   . HTN (hypertension) 2007   seen in ER  with headaches  . Hypertension   . Hypokalemia 2007   due to HCTZ  . Nervousness   . Obesity   . Stress   . Swelling of lower extremity   . Trouble in sleeping     PAST SURGICAL HISTORY: Past Surgical History:  Procedure Laterality Date  . arm surgery     post trauma @ age 17  . BREAST SURGERY Right 2016   BREAST BX   . CHOLECYSTECTOMY     gall stones  . WISDOM TOOTH EXTRACTION      SOCIAL HISTORY: Social History   Tobacco Use  . Smoking status: Never Smoker  . Smokeless tobacco: Never Used  Substance Use Topics  . Alcohol use: No  . Drug use: No    FAMILY HISTORY: Family History  Problem Relation Age of Onset  . Cancer Maternal Grandmother        thyroid  . Hypertension Maternal Grandmother   . Hyperlipidemia Mother   . Hypertension Mother   . Depression Mother   . Anxiety disorder Mother   . Obesity Mother    . Breast cancer Maternal Aunt 51  . Kidney disease Maternal Aunt         X 2; 1 on Dialysis  . Cancer Maternal Aunt        ? primary; also had HTN  . Prostate cancer Paternal Uncle          3 uncles  . Prostate cancer Father   . Cancer Father   . Hypertension Paternal Grandmother   . Hypertension Maternal Aunt        X 2  . Bipolar disorder Paternal Aunt   . Stroke Neg Hx   . Diabetes Neg Hx   . Heart disease Neg Hx   . Colon cancer Neg Hx  ROS: Review of Systems  Constitutional: Positive for malaise/fatigue and weight loss.  Gastrointestinal: Negative for nausea and vomiting.  Musculoskeletal:       Negative muscle weakness  Endo/Heme/Allergies:       Negative hypoglycemia    PHYSICAL EXAM: Pt in no acute distress  RECENT LABS AND TESTS: BMET    Component Value Date/Time   NA 138 09/03/2018 0929   NA 142 05/05/2018 1252   K 4.6 09/03/2018 0929   CL 105 09/03/2018 0929   CO2 24 09/03/2018 0929   GLUCOSE 92 09/03/2018 0929   BUN 19 09/03/2018 0929   BUN 12 05/05/2018 1252   CREATININE 1.34 (H) 09/03/2018 0929   CREATININE 1.28 (H) 02/19/2017 0851   CALCIUM 9.7 09/03/2018 0929   GFRNONAA 57 (L) 05/05/2018 1252   GFRAA 65 05/05/2018 1252   Lab Results  Component Value Date   HGBA1C 5.9 (H) 05/05/2018   HGBA1C 5.4 04/19/2015   HGBA1C 5.7 02/15/2013   HGBA1C 5.4 12/03/2010   HGBA1C 5.3 09/28/2008   Lab Results  Component Value Date   INSULIN 18.3 05/05/2018   CBC    Component Value Date/Time   WBC 9.2 09/03/2018 0929   RBC 4.50 09/03/2018 0929   HGB 12.5 09/03/2018 0929   HGB 12.8 05/05/2018 1252   HCT 38.8 09/03/2018 0929   HCT 40.4 05/05/2018 1252   PLT 291.0 09/03/2018 0929   MCV 86.3 09/03/2018 0929   MCV 86 05/05/2018 1252   MCH 27.1 05/05/2018 1252   MCH 27.5 11/02/2017 0839   MCHC 32.2 09/03/2018 0929   RDW 15.5 09/03/2018 0929   RDW 13.6 05/05/2018 1252   LYMPHSABS 1.9 09/03/2018 0929   LYMPHSABS 2.1 05/05/2018 1252   MONOABS 0.6  09/03/2018 0929   EOSABS 0.1 09/03/2018 0929   EOSABS 0.1 05/05/2018 1252   BASOSABS 0.0 09/03/2018 0929   BASOSABS 0.0 05/05/2018 1252   Iron/TIBC/Ferritin/ %Sat No results found for: IRON, TIBC, FERRITIN, IRONPCTSAT Lipid Panel     Component Value Date/Time   CHOL 148 09/03/2018 0929   CHOL 160 05/05/2018 1252   TRIG 74.0 09/03/2018 0929   HDL 45.50 09/03/2018 0929   HDL 54 05/05/2018 1252   CHOLHDL 3 09/03/2018 0929   VLDL 14.8 09/03/2018 0929   LDLCALC 87 09/03/2018 0929   LDLCALC 93 05/05/2018 1252   LDLCALC 129 (H) 02/19/2017 0851   Hepatic Function Panel     Component Value Date/Time   PROT 7.2 09/03/2018 0929   PROT 7.0 05/05/2018 1252   ALBUMIN 4.2 09/03/2018 0929   ALBUMIN 4.1 05/05/2018 1252   AST 16 09/03/2018 0929   ALT 20 09/03/2018 0929   ALKPHOS 131 (H) 09/03/2018 0929   BILITOT 0.5 09/03/2018 0929   BILITOT 0.4 05/05/2018 1252   BILIDIR 0.1 04/20/2014 0804      Component Value Date/Time   TSH 2.31 09/03/2018 0929   TSH 1.250 05/05/2018 1252   TSH 1.94 06/08/2017 1343      I, Trixie Dredge, am acting as transcriptionist for Ilene Qua, MD  I have reviewed the above documentation for accuracy and completeness, and I agree with the above. - Ilene Qua, MD

## 2018-11-24 ENCOUNTER — Encounter: Payer: Self-pay | Admitting: Family Medicine

## 2018-11-30 ENCOUNTER — Ambulatory Visit (INDEPENDENT_AMBULATORY_CARE_PROVIDER_SITE_OTHER): Payer: BC Managed Care – PPO | Admitting: Psychology

## 2018-11-30 DIAGNOSIS — F339 Major depressive disorder, recurrent, unspecified: Secondary | ICD-10-CM | POA: Diagnosis not present

## 2018-12-01 ENCOUNTER — Encounter: Payer: Self-pay | Admitting: Gynecology

## 2018-12-06 ENCOUNTER — Telehealth: Payer: Self-pay | Admitting: *Deleted

## 2018-12-06 NOTE — Telephone Encounter (Signed)
Pt has lab appt scheduled for Wednesday but there are no future lab orders in Epic. Please place orders if appropriate.

## 2018-12-06 NOTE — Telephone Encounter (Signed)
Please advise 

## 2018-12-06 NOTE — Telephone Encounter (Signed)
Orders in 

## 2018-12-08 ENCOUNTER — Ambulatory Visit (INDEPENDENT_AMBULATORY_CARE_PROVIDER_SITE_OTHER): Payer: BC Managed Care – PPO | Admitting: Family Medicine

## 2018-12-08 ENCOUNTER — Other Ambulatory Visit: Payer: BC Managed Care – PPO

## 2018-12-08 ENCOUNTER — Other Ambulatory Visit: Payer: Self-pay

## 2018-12-08 VITALS — BP 114/74 | HR 69 | Temp 98.1°F | Ht 67.0 in | Wt 231.0 lb

## 2018-12-08 DIAGNOSIS — Z9189 Other specified personal risk factors, not elsewhere classified: Secondary | ICD-10-CM

## 2018-12-08 DIAGNOSIS — E559 Vitamin D deficiency, unspecified: Secondary | ICD-10-CM | POA: Diagnosis not present

## 2018-12-08 DIAGNOSIS — R7303 Prediabetes: Secondary | ICD-10-CM

## 2018-12-08 DIAGNOSIS — Z6836 Body mass index (BMI) 36.0-36.9, adult: Secondary | ICD-10-CM

## 2018-12-08 NOTE — Progress Notes (Signed)
Office: [phone]  /  Fax: [phone]   HPI:   Chief Complaint: OBESITY Cheryl Owens is here to discuss her progress with her obesity treatment plan. She is on the Category 3 plan and is following her eating plan approximately 50 % of the time. She states she is exercising 0 minutes 0 times per week. Cheryl Owens focused on getting closer to 100 grams of protein in the first week. The last few days, patient has been drinking and eating chicken noodle soup for dinner. She stops eating at approximately 8:30 and starts eating at approximately 10:30 AM. Her weight is 231 lb (104.8 kg) today and has had a weight gain of 3 pounds since her last in-office visit. She has lost 21 lbs since starting treatment with [place].  Vitamin D deficiency Cheryl Owens has a diagnosis of vitamin D deficiency. Cheryl Owens is on prescription strength vit D. She admits fatigue and denies nausea, vomiting or muscle weakness.  At risk for osteopenia and osteoporosis Cheryl Owens is at higher risk of osteopenia and osteoporosis due to vitamin D deficiency.   Pre-Diabetes Cheryl Owens has a diagnosis of prediabetes based on her elevated Hgb A1c and was informed this puts her at greater risk of developing diabetes. Her last A1c was in February 2020 and was at 5.9. Her last insulin level was on 09/03/18 and was at 26.0 She is not taking metformin currently and continues to work on diet and exercise to decrease risk of diabetes. She denies nausea or hypoglycemia.  ASSESSMENT AND PLAN:  Vitamin D deficiency - Plan: VITAMIN D 25 Hydroxy (Vit-D Deficiency, Fractures)  Prediabetes - Plan: Comprehensive metabolic panel, Hemoglobin A1c, Insulin, random  At risk for osteoporosis  Class 2 severe obesity with serious comorbidity and body mass index (BMI) of 36.0 to 36.9 in adult, unspecified obesity type ([place])  PLAN:  Vitamin D Deficiency Cheryl Owens was informed that low vitamin D levels contributes to fatigue and are associated with obesity, breast, and colon cancer. Cheryl Owens  will continue to take prescription Vit D @50 ,000 IU every week and she will follow up for routine testing of vitamin D, at least 2-3 times per year. She was informed of the risk of over-replacement of vitamin D and agrees to not increase her dose unless she discusses this with [place] first. We will check vitamin D level today and Tywanda will follow up as directed.  At risk for osteopenia and osteoporosis Cheryl Owens was given extended  (15 minutes) osteoporosis prevention counseling today. Cheryl Owens is at risk for osteopenia and osteoporosis due to her vitamin D deficiency. She was encouraged to take her vitamin D and follow her higher calcium diet and increase strengthening exercise to help strengthen her bones and decrease her risk of osteopenia and osteoporosis.  Pre-Diabetes Cheryl Owens will continue to work on weight loss, exercise, and decreasing simple carbohydrates in her diet to help decrease the risk of diabetes. She was informed that eating too many simple carbohydrates or too many calories at one sitting increases the likelihood of GI side effects. We will check Hgb A1c, insulin and CMP today and Devani agreed to follow up with [place] as directed to monitor her progress.  Obesity Cheryl Owens is currently in the action stage of change. As such, her goal is to continue with weight loss efforts She has agreed to follow the Category 3 plan +100 calories daily Cheryl Owens has been instructed to work up to a goal of 150 minutes of combined cardio and strengthening exercise per week for weight loss and overall  health benefits. We discussed the following Behavioral Modification Strategies today: planning for success, keeping healthy foods in the home, increasing lean protein intake, increasing vegetables and work on meal planning and easy cooking plans  Patient is to re-commit to a walking group.  Cheryl Owens has agreed to follow up with our clinic in 2 weeks. She was informed of the importance of frequent follow up visits to maximize her success  with intensive lifestyle modifications for her multiple health conditions.  ALLERGIES: Allergies  Allergen Reactions   Hydrochlorothiazide     REACTION: hypokalemia   Benazepril     Nausea& vomiting, abd cramps    MEDICATIONS: Current Outpatient Medications on File Prior to Visit  Medication Sig Dispense Refill   atorvastatin (LIPITOR) 20 MG tablet TAKE 1 TABLET DAILY 90 tablet 3   buPROPion (WELLBUTRIN XL) 300 MG 24 hr tablet TAKE 1 TABLET EVERY MORNING 90 tablet 3   clonazePAM (KLONOPIN) 0.5 MG tablet TAKE 1/2 TABLET IN THE MORNING AND 2 AT BEDTIME 225 tablet 0   escitalopram (LEXAPRO) 10 MG tablet TAKE 1 TABLET DAILY 90 tablet 3   Cheryl Owens 0.35 MG tablet TAKE 1 TABLET DAILY 84 tablet 3   labetalol (NORMODYNE) 100 MG tablet Take 1 tablet (100 mg total) by mouth 2 (two) times daily. 180 tablet 3   spironolactone (ALDACTONE) 25 MG tablet TAKE 1 TABLET TWICE A DAY 180 tablet 3   Vitamin D, Ergocalciferol, (DRISDOL) 1.25 MG (50000 UT) CAPS capsule Take 1 capsule (50,000 Units total) by mouth every 7 (seven) days. 12 capsule 0   No current facility-administered medications on file prior to visit.     PAST MEDICAL HISTORY: Past Medical History:  Diagnosis Date   Anxiety    Depression    Dry mouth    Fibroids    Gallbladder disease    High cholesterol    HTN (hypertension) 2007   seen in ER  with headaches   Hypertension    Hypokalemia 2007   due to HCTZ   Nervousness    Obesity    Stress    Swelling of lower extremity    Trouble in sleeping     PAST SURGICAL HISTORY: Past Surgical History:  Procedure Laterality Date   arm surgery     post trauma @ age 73   BREAST SURGERY Right 2016   BREAST BX    CHOLECYSTECTOMY     gall stones   WISDOM TOOTH EXTRACTION      SOCIAL HISTORY: Social History   Tobacco Use   Smoking status: Never Smoker   Smokeless tobacco: Never Used  Substance Use Topics   Alcohol use: No   Drug use: No     FAMILY HISTORY: Family History  Problem Relation Age of Onset   Cancer Maternal Grandmother        thyroid   Hypertension Maternal Grandmother    Hyperlipidemia Mother    Hypertension Mother    Depression Mother    Anxiety disorder Mother    Obesity Mother    Breast cancer Maternal Aunt 34   Kidney disease Maternal Aunt         X 2; 1 on Dialysis   Cancer Maternal Aunt        ? primary; also had HTN   Prostate cancer Paternal Uncle          3 uncles   Prostate cancer Father    Cancer Father    Hypertension Paternal Grandmother    Hypertension Maternal  Aunt        X 2   Bipolar disorder Paternal Aunt    Stroke Neg Hx    Diabetes Neg Hx    Heart disease Neg Hx    Colon cancer Neg Hx     ROS: Review of Systems  Constitutional: Positive for malaise/fatigue. Negative for weight loss.  Gastrointestinal: Negative for nausea and vomiting.  Musculoskeletal:       Negative for muscle weakness  Endo/Heme/Allergies:       Negative for hypoglycemia    PHYSICAL EXAM: Blood pressure 114/74, pulse 69, temperature 98.1 F (36.7 C), temperature source Oral, height 5\' 7"  (1.702 m), weight 231 lb (104.8 kg), SpO2 98 %. Body mass index is 36.18 kg/m. Physical Exam Vitals signs reviewed.  Constitutional:      Appearance: Normal appearance. She is well-developed. She is obese.  Cardiovascular:     Rate and Rhythm: Normal rate.  Pulmonary:     Effort: Pulmonary effort is normal.  Musculoskeletal: Normal range of motion.  Skin:    General: Skin is warm and dry.  Neurological:     Mental Status: She is alert and oriented to person, place, and time.  Psychiatric:        Mood and Affect: Mood normal.        Behavior: Behavior normal.     RECENT LABS AND TESTS: BMET    Component Value Date/Time   NA 138 09/03/2018 0929   NA 142 05/05/2018 1252   K 4.6 09/03/2018 0929   CL 105 09/03/2018 0929   CO2 24 09/03/2018 0929   GLUCOSE 92 09/03/2018 0929    BUN 19 09/03/2018 0929   BUN 12 05/05/2018 1252   CREATININE 1.34 (H) 09/03/2018 0929   CREATININE 1.28 (H) 02/19/2017 0851   CALCIUM 9.7 09/03/2018 0929   GFRNONAA 57 (L) 05/05/2018 1252   GFRAA 65 05/05/2018 1252   Lab Results  Component Value Date   HGBA1C 5.9 (H) 05/05/2018   HGBA1C 5.4 04/19/2015   HGBA1C 5.7 02/15/2013   HGBA1C 5.4 12/03/2010   HGBA1C 5.3 09/28/2008   Lab Results  Component Value Date   INSULIN 18.3 05/05/2018   CBC    Component Value Date/Time   WBC 9.2 09/03/2018 0929   RBC 4.50 09/03/2018 0929   HGB 12.5 09/03/2018 0929   HGB 12.8 05/05/2018 1252   HCT 38.8 09/03/2018 0929   HCT 40.4 05/05/2018 1252   PLT 291.0 09/03/2018 0929   MCV 86.3 09/03/2018 0929   MCV 86 05/05/2018 1252   MCH 27.1 05/05/2018 1252   MCH 27.5 11/02/2017 0839   MCHC 32.2 09/03/2018 0929   RDW 15.5 09/03/2018 0929   RDW 13.6 05/05/2018 1252   LYMPHSABS 1.9 09/03/2018 0929   LYMPHSABS 2.1 05/05/2018 1252   MONOABS 0.6 09/03/2018 0929   EOSABS 0.1 09/03/2018 0929   EOSABS 0.1 05/05/2018 1252   BASOSABS 0.0 09/03/2018 0929   BASOSABS 0.0 05/05/2018 1252   Iron/TIBC/Ferritin/ %Sat No results found for: IRON, TIBC, FERRITIN, IRONPCTSAT Lipid Panel     Component Value Date/Time   CHOL 148 09/03/2018 0929   CHOL 160 05/05/2018 1252   TRIG 74.0 09/03/2018 0929   HDL 45.50 09/03/2018 0929   HDL 54 05/05/2018 1252   CHOLHDL 3 09/03/2018 0929   VLDL 14.8 09/03/2018 0929   LDLCALC 87 09/03/2018 0929   LDLCALC 93 05/05/2018 1252   LDLCALC 129 (H) 02/19/2017 0851   Hepatic Function Panel     Component Value  Date/Time   PROT 7.2 09/03/2018 0929   PROT 7.0 05/05/2018 1252   ALBUMIN 4.2 09/03/2018 0929   ALBUMIN 4.1 05/05/2018 1252   AST 16 09/03/2018 0929   ALT 20 09/03/2018 0929   ALKPHOS 131 (H) 09/03/2018 0929   BILITOT 0.5 09/03/2018 0929   BILITOT 0.4 05/05/2018 1252   BILIDIR 0.1 04/20/2014 0804      Component Value Date/Time   TSH 2.31 09/03/2018 0929    TSH 1.250 05/05/2018 1252   TSH 1.94 06/08/2017 1343    Results for ROSEBELLE, EDGEWORTH "MICHELE" (MRN [id number]) as of 12/08/2018 12:22  Ref. Range 09/03/2018 09:29  VITD Latest Ref Range: 30.00 - 100.00 ng/mL 39.37    OBESITY BEHAVIORAL INTERVENTION VISIT  Today's visit was # 15   Starting weight: 252 lbs Starting date: 05/05/2018 Today's weight : 231 lbs Today's date: 12/08/2018 Total lbs lost to date: 21    12/08/2018  Height 5\' 7"  (1.702 m)  Weight 231 lb (104.8 kg)  BMI (Calculated) 36.17  BLOOD PRESSURE - SYSTOLIC 99991111  BLOOD PRESSURE - DIASTOLIC 74   Body Fat % 0000000 %  Total Body Water (lbs) 93 lbs    ASK: We discussed the diagnosis of obesity with Myna Hidalgo today and Lucita Ferrara agreed to give [place] permission to discuss obesity behavioral modification therapy today.  ASSESS: Bekka has the diagnosis of obesity and her BMI today is 36.17 Kiandria is in the action stage of change   ADVISE: Ernesto was educated on the multiple health risks of obesity as well as the benefit of weight loss to improve her health. She was advised of the need for long term treatment and the importance of lifestyle modifications to improve her current health and to decrease her risk of future health problems.  AGREE: Multiple dietary modification options and treatment options were discussed and  Jeraldene agreed to follow the recommendations documented in the above note.  ARRANGE: Maddilynn was educated on the importance of frequent visits to treat obesity as outlined per CMS and USPSTF guidelines and agreed to schedule her next follow up appointment today.  I, Doreene Nest, am acting as transcriptionist for Eber Jones, MD  I have reviewed the above documentation for accuracy and completeness, and I agree with the above. - Ilene Qua, MD

## 2018-12-09 LAB — COMPREHENSIVE METABOLIC PANEL
ALT: 23 IU/L (ref 0–32)
AST: 18 IU/L (ref 0–40)
Albumin/Globulin Ratio: 1.7 (ref 1.2–2.2)
Albumin: 4 g/dL (ref 3.8–4.9)
Alkaline Phosphatase: 142 IU/L — ABNORMAL HIGH (ref 39–117)
BUN/Creatinine Ratio: 9 (ref 9–23)
BUN: 12 mg/dL (ref 6–24)
Bilirubin Total: 0.4 mg/dL (ref 0.0–1.2)
CO2: 20 mmol/L (ref 20–29)
Calcium: 9.4 mg/dL (ref 8.7–10.2)
Chloride: 109 mmol/L — ABNORMAL HIGH (ref 96–106)
Creatinine, Ser: 1.35 mg/dL — ABNORMAL HIGH (ref 0.57–1.00)
GFR calc Af Amer: 52 mL/min/{1.73_m2} — ABNORMAL LOW (ref >59)
GFR calc non Af Amer: 45 mL/min/{1.73_m2} — ABNORMAL LOW (ref >59)
Globulin, Total: 2.4 g/dL (ref 1.5–4.5)
Glucose: 86 mg/dL (ref 65–99)
Potassium: 4.1 mmol/L (ref 3.5–5.2)
Sodium: 143 mmol/L (ref 134–144)
Total Protein: 6.4 g/dL (ref 6.0–8.5)

## 2018-12-09 LAB — HEMOGLOBIN A1C
Est. average glucose Bld gHb Est-mCnc: 111 mg/dL
Hgb A1c MFr Bld: 5.5 % (ref 4.8–5.6)

## 2018-12-09 LAB — INSULIN, RANDOM: INSULIN: 15.4 u[IU]/mL (ref 2.6–24.9)

## 2018-12-09 LAB — VITAMIN D 25 HYDROXY (VIT D DEFICIENCY, FRACTURES): Vit D, 25-Hydroxy: 46.7 ng/mL (ref 30.0–100.0)

## 2018-12-14 ENCOUNTER — Ambulatory Visit (INDEPENDENT_AMBULATORY_CARE_PROVIDER_SITE_OTHER): Payer: BC Managed Care – PPO | Admitting: Psychology

## 2018-12-14 DIAGNOSIS — F339 Major depressive disorder, recurrent, unspecified: Secondary | ICD-10-CM | POA: Diagnosis not present

## 2018-12-19 ENCOUNTER — Encounter: Payer: Self-pay | Admitting: Family Medicine

## 2018-12-21 NOTE — Telephone Encounter (Signed)
[facility] did the labs Overall they look good  We will have to watch kidney function --- BUN/ CR

## 2018-12-23 ENCOUNTER — Telehealth (INDEPENDENT_AMBULATORY_CARE_PROVIDER_SITE_OTHER): Payer: BC Managed Care – PPO | Admitting: Family Medicine

## 2018-12-23 ENCOUNTER — Encounter (INDEPENDENT_AMBULATORY_CARE_PROVIDER_SITE_OTHER): Payer: Self-pay | Admitting: Family Medicine

## 2018-12-23 ENCOUNTER — Other Ambulatory Visit: Payer: Self-pay

## 2018-12-23 DIAGNOSIS — N1831 Chronic kidney disease, stage 3a: Secondary | ICD-10-CM | POA: Diagnosis not present

## 2018-12-23 DIAGNOSIS — Z6836 Body mass index (BMI) 36.0-36.9, adult: Secondary | ICD-10-CM | POA: Diagnosis not present

## 2018-12-23 DIAGNOSIS — E559 Vitamin D deficiency, unspecified: Secondary | ICD-10-CM | POA: Diagnosis not present

## 2018-12-28 NOTE — Progress Notes (Signed)
Office: [phone]  /  Fax: [phone] TeleHealth Visit:  Cheryl Owens has verbally consented to this TeleHealth visit today. The patient is located at home, the provider is located at the [facility] office. The participants in this visit include the listed provider and patient and any and all parties involved. The visit was conducted today via WebEx.  HPI:   Chief Complaint: OBESITY Cheryl Owens is here to discuss her progress with her obesity treatment plan. She is on the Category 3 plan +100 calories and is following her eating plan approximately 30 % of the time. She states she is walking 60 minutes 1 time per week. Cheryl Owens feels she has maintained her weight. She has not weighed recently at home. She has not been sticking to the plan well and she has not been drinking enough water. She tries to only eat between 10:30 AM and 8:30 PM. She admits to eating too many extra calories calories. We were unable to weigh the patient today for this TeleHealth visit. She feels as if she has maintained weight since her last visit (weight not reported). She has lost 21 lbs since starting treatment with [place].  Vitamin D deficiency Cheryl Owens has a diagnosis of vitamin D deficiency. Her last vitamin D level was at 46.7 on 12/08/18 and was much improved. Cheryl Owens is currently taking vit D and she denies nausea, vomiting or muscle weakness.  Chronic Kidney Disease Stage 3 Cheryl Owens has a diagnosis of chronic kidney disease and her GFR has worsened to 52 from 46. Her blood pressure is well controlled.  ASSESSMENT AND PLAN:  Vitamin D deficiency  Stage 3a chronic kidney disease  Class 2 severe obesity with serious comorbidity and body mass index (BMI) of 36.0 to 36.9 in adult, unspecified obesity type ([name])  PLAN:  Vitamin D Deficiency Cheryl Owens was informed that low vitamin D levels contributes to fatigue and are associated with obesity, breast, and colon cancer. [name] agrees to continue to take prescription Vit D  @50 ,000 IU every week and she will follow up for routine testing of vitamin D, at least 2-3 times per year. She was informed of the risk of over-replacement of vitamin D and agrees to not increase her dose unless she discusses this with [place] first.  Chronic Kidney Disease Stage 3 Cheryl Owens will avoid NSAIDs and increase water intake. She will take her blood pressure medications as ordered. Cheryl Owens agrees to follow up with our clinic in 2 weeks.  Obesity Cheryl Owens is currently in the action stage of change. As such, her goal is to continue with weight loss efforts She has agreed to follow the Category 3 plan +100 calories Cheryl Owens will continue walking 60 minutes 1 time per week for weight loss and overall health benefits. We discussed the following Behavioral Modification Strategies today: planning for success and increasing lean protein intake  Cheryl Owens has agreed to follow up with our clinic in 2 weeks. She was informed of the importance of frequent follow up visits to maximize her success with intensive lifestyle modifications for her multiple health conditions.  ALLERGIES: Allergies  Allergen Reactions  . Hydrochlorothiazide     REACTION: hypokalemia  . Benazepril     Nausea& vomiting, abd cramps    MEDICATIONS: Current Outpatient Medications on File Prior to Visit  Medication Sig Dispense Refill  . atorvastatin (LIPITOR) 20 MG tablet TAKE 1 TABLET DAILY 90 tablet 3  . buPROPion (WELLBUTRIN XL) 300 MG 24 hr tablet TAKE 1 TABLET EVERY MORNING 90  tablet 3  . clonazePAM (KLONOPIN) 0.5 MG tablet TAKE 1/2 TABLET IN THE MORNING AND 2 AT BEDTIME 225 tablet 0  . escitalopram (LEXAPRO) 10 MG tablet TAKE 1 TABLET DAILY 90 tablet 3  . [name] 0.35 MG tablet TAKE 1 TABLET DAILY 84 tablet 3  . labetalol (NORMODYNE) 100 MG tablet Take 1 tablet (100 mg total) by mouth 2 (two) times daily. 180 tablet 3  . spironolactone (ALDACTONE) 25 MG tablet TAKE 1 TABLET TWICE A DAY 180 tablet 3  . Vitamin D, Ergocalciferol,  (DRISDOL) 1.25 MG (50000 UT) CAPS capsule Take 1 capsule (50,000 Units total) by mouth every 7 (seven) days. 12 capsule 0   No current facility-administered medications on file prior to visit.     PAST MEDICAL HISTORY: Past Medical History:  Diagnosis Date  . Anxiety   . Depression   . Dry mouth   . Fibroids   . Gallbladder disease   . High cholesterol   . HTN (hypertension) 2007   seen in ER  with headaches  . Hypertension   . Hypokalemia 2007   due to HCTZ  . Nervousness   . Obesity   . Stress   . Swelling of lower extremity   . Trouble in sleeping     PAST SURGICAL HISTORY: Past Surgical History:  Procedure Laterality Date  . arm surgery     post trauma @ age 89  . BREAST SURGERY Right 2016   BREAST BX   . CHOLECYSTECTOMY     gall stones  . WISDOM TOOTH EXTRACTION      SOCIAL HISTORY: Social History   Tobacco Use  . Smoking status: Never Smoker  . Smokeless tobacco: Never Used  Substance Use Topics  . Alcohol use: No  . Drug use: No    FAMILY HISTORY: Family History  Problem Relation Age of Onset  . Cancer Maternal Grandmother        thyroid  . Hypertension Maternal Grandmother   . Hyperlipidemia Mother   . Hypertension Mother   . Depression Mother   . Anxiety disorder Mother   . Obesity Mother   . Breast cancer Maternal Aunt 51  . Kidney disease Maternal Aunt         X 2; 1 on Dialysis  . Cancer Maternal Aunt        ? primary; also had HTN  . Prostate cancer Paternal Uncle          3 uncles  . Prostate cancer Father   . Cancer Father   . Hypertension Paternal Grandmother   . Hypertension Maternal Aunt        X 2  . Bipolar disorder Paternal Aunt   . Stroke Neg Hx   . Diabetes Neg Hx   . Heart disease Neg Hx   . Colon cancer Neg Hx     ROS: Review of Systems  Constitutional: Negative for weight loss.  Gastrointestinal: Negative for nausea and vomiting.  Musculoskeletal:       Negative for muscle weakness    PHYSICAL EXAM: Pt in  no acute distress  RECENT LABS AND TESTS: BMET    Component Value Date/Time   NA 143 12/08/2018 0820   K 4.1 12/08/2018 0820   CL 109 (H) 12/08/2018 0820   CO2 20 12/08/2018 0820   GLUCOSE 86 12/08/2018 0820   GLUCOSE 92 09/03/2018 0929   BUN 12 12/08/2018 0820   CREATININE 1.35 (H) 12/08/2018 0820   CREATININE 1.28 (H) 02/19/2017  0851   CALCIUM 9.4 12/08/2018 0820   GFRNONAA 45 (L) 12/08/2018 0820   GFRAA 52 (L) 12/08/2018 0820   Lab Results  Component Value Date   HGBA1C 5.5 12/08/2018   HGBA1C 5.9 (H) 05/05/2018   HGBA1C 5.4 04/19/2015   HGBA1C 5.7 02/15/2013   HGBA1C 5.4 12/03/2010   Lab Results  Component Value Date   INSULIN 15.4 12/08/2018   INSULIN 18.3 05/05/2018   CBC    Component Value Date/Time   WBC 9.2 09/03/2018 0929   RBC 4.50 09/03/2018 0929   HGB 12.5 09/03/2018 0929   HGB 12.8 05/05/2018 1252   HCT 38.8 09/03/2018 0929   HCT 40.4 05/05/2018 1252   PLT 291.0 09/03/2018 0929   MCV 86.3 09/03/2018 0929   MCV 86 05/05/2018 1252   MCH 27.1 05/05/2018 1252   MCH 27.5 11/02/2017 0839   MCHC 32.2 09/03/2018 0929   RDW 15.5 09/03/2018 0929   RDW 13.6 05/05/2018 1252   LYMPHSABS 1.9 09/03/2018 0929   LYMPHSABS 2.1 05/05/2018 1252   MONOABS 0.6 09/03/2018 0929   EOSABS 0.1 09/03/2018 0929   EOSABS 0.1 05/05/2018 1252   BASOSABS 0.0 09/03/2018 0929   BASOSABS 0.0 05/05/2018 1252   Iron/TIBC/Ferritin/ %Sat No results found for: IRON, TIBC, FERRITIN, IRONPCTSAT Lipid Panel     Component Value Date/Time   CHOL 148 09/03/2018 0929   CHOL 160 05/05/2018 1252   TRIG 74.0 09/03/2018 0929   HDL 45.50 09/03/2018 0929   HDL 54 05/05/2018 1252   CHOLHDL 3 09/03/2018 0929   VLDL 14.8 09/03/2018 0929   LDLCALC 87 09/03/2018 0929   LDLCALC 93 05/05/2018 1252   LDLCALC 129 (H) 02/19/2017 0851   Hepatic Function Panel     Component Value Date/Time   PROT 6.4 12/08/2018 0820   ALBUMIN 4.0 12/08/2018 0820   AST 18 12/08/2018 0820   ALT 23 12/08/2018  0820   ALKPHOS 142 (H) 12/08/2018 0820   BILITOT 0.4 12/08/2018 0820   BILIDIR 0.1 04/20/2014 0804      Component Value Date/Time   TSH 2.31 09/03/2018 0929   TSH 1.250 05/05/2018 1252   TSH 1.94 06/08/2017 1343     Ref. Range 12/08/2018 08:20  Vitamin D, 25-Hydroxy Latest Ref Range: 30.0 - 100.0 ng/mL 46.7    I, Doreene Nest, am acting as Location manager for [employer], FNP-C.  I have reviewed the above documentation for accuracy and completeness, and I agree with the above.  - Khyrie Masi, FNP-C.

## 2019-01-13 ENCOUNTER — Other Ambulatory Visit: Payer: Self-pay

## 2019-01-13 ENCOUNTER — Encounter (INDEPENDENT_AMBULATORY_CARE_PROVIDER_SITE_OTHER): Payer: Self-pay | Admitting: Family Medicine

## 2019-01-13 ENCOUNTER — Ambulatory Visit (INDEPENDENT_AMBULATORY_CARE_PROVIDER_SITE_OTHER): Payer: BC Managed Care – PPO | Admitting: Family Medicine

## 2019-01-13 VITALS — BP 103/70 | HR 79 | Temp 98.2°F | Ht 67.0 in | Wt 232.0 lb

## 2019-01-13 DIAGNOSIS — I10 Essential (primary) hypertension: Secondary | ICD-10-CM

## 2019-01-13 DIAGNOSIS — E559 Vitamin D deficiency, unspecified: Secondary | ICD-10-CM

## 2019-01-13 DIAGNOSIS — Z6836 Body mass index (BMI) 36.0-36.9, adult: Secondary | ICD-10-CM

## 2019-01-13 DIAGNOSIS — Z9189 Other specified personal risk factors, not elsewhere classified: Secondary | ICD-10-CM | POA: Diagnosis not present

## 2019-01-13 MED ORDER — VITAMIN D (ERGOCALCIFEROL) 1.25 MG (50000 UNIT) PO CAPS: 50000.0000 [IU] | ORAL_CAPSULE | ORAL | 0 refills | Status: DC

## 2019-01-17 ENCOUNTER — Encounter (INDEPENDENT_AMBULATORY_CARE_PROVIDER_SITE_OTHER): Payer: Self-pay | Admitting: Family Medicine

## 2019-01-17 DIAGNOSIS — Z6836 Body mass index (BMI) 36.0-36.9, adult: Secondary | ICD-10-CM | POA: Insufficient documentation

## 2019-01-17 NOTE — Progress Notes (Signed)
Office: [phone]  /  Fax: [phone]   HPI:   Chief Complaint: OBESITY Cheryl Owens is here to discuss her progress with her obesity treatment plan. She is on the Category 3 plan +100 calories and is following her eating plan approximately 0 % of the time. She states she is walking 60 minutes 3 times per week. Cheryl Owens has not been logging her food or drinking enough water. She was supposed to get back to the Category 3 plan +100 calories but she has not done it. She feels she will do better with journaling. Her weight is 232 lb (105.2 kg) today and has had a weight loss of 1 pound since her last in-office visit. She has lost 20 lbs since starting treatment with [place].  Vitamin D deficiency Cheryl Owens has a diagnosis of vitamin D deficiency. Cheryl Owens in not at goal but she is nearly there (46.7 on 12/08/18). She is currently taking vit D and denies nausea, vomiting or muscle weakness.  Hypertension Cheryl Owens is a 53 y.o. female with hypertension.Cheryl Owens's blood pressure is well controlled on Labetalol.  Cheryl Owens denies chest pain or shortness of breath on exertion. She is working weight loss to help control her blood pressure with the goal of decreasing her risk of heart attack and stroke. BP Readings from Last 3 Encounters:  01/13/19 103/70  12/08/18 114/74  10/19/18 99/66      At risk for cardiovascular disease Cheryl Owens is at a higher than average risk for cardiovascular disease due to obesity and hypertension. She currently denies any chest pain.  ASSESSMENT AND PLAN:  Vitamin D deficiency - Plan: Vitamin D, Ergocalciferol, (DRISDOL) 1.25 MG (50000 UT) CAPS capsule  Essential hypertension  At risk for heart disease  Class 2 severe obesity with serious comorbidity and body mass index (BMI) of 36.0 to 36.9 in adult, unspecified obesity type ([place])  PLAN:  Vitamin D Deficiency Cheryl Owens was informed that low vitamin D levels contributes to fatigue and are associated with obesity, breast, and colon  cancer. Cheryl Owens agrees to continue to take prescription Vit D @50 ,000 IU every week #12 with no refills and she will follow up for routine testing of vitamin D, at least 2-3 times per year. She was informed of the risk of over-replacement of vitamin D and agrees to not increase her dose unless she discusses this with [place] first. Cheryl Owens agrees to follow up with our clinic in 3 to 4 weeks.  Hypertension We discussed sodium restriction, working on healthy weight loss, and a regular exercise program as the means to achieve improved blood pressure control. Cheryl Owens agreed with this plan and agreed to follow up as directed. We will continue to monitor her blood pressure as well as her progress with the above lifestyle modifications. [name] will continue labetalol as prescribed and she will watch for signs of hypotension as she continues her lifestyle modifications.  Cardiovascular risk counseling Cheryl Owens was given extended (15 minutes) coronary artery disease prevention counseling today. She is 53 y.o. female and has risk factors for heart disease including obesity and hypertension. We discussed intensive lifestyle modifications today with an emphasis on specific weight loss instructions and strategies. Pt was also informed of the importance of increasing exercise and decreasing saturated fats to help prevent heart disease.  Obesity Cheryl Owens is currently in the action stage of change. As such, her goal is to continue with weight loss efforts She has agreed to keep a food journal with 1450 to 1600 calories and 100  grams of protein daily Cheryl Owens will continue walking for 60 minutes, 3 times per week for weight loss and overall health benefits. We discussed the following Behavioral Modification Strategies today: planning for success, better snacking choices, increasing lean protein intake, decreasing simple carbohydrates and work on meal planning and easy cooking plans Handouts for eating out and smart fruit, were provided to patient  today. We discussed choosing better foods when eating out.  Cheryl Owens has agreed to follow up with our clinic in 3 to 4 weeks. She was informed of the importance of frequent follow up visits to maximize her success with intensive lifestyle modifications for her multiple health conditions.  ALLERGIES: Allergies  Allergen Reactions   Hydrochlorothiazide     REACTION: hypokalemia   Benazepril     Nausea& vomiting, abd cramps    MEDICATIONS: Current Outpatient Medications on File Prior to Visit  Medication Sig Dispense Refill   atorvastatin (LIPITOR) 20 MG tablet TAKE 1 TABLET DAILY 90 tablet 3   buPROPion (WELLBUTRIN XL) 300 MG 24 hr tablet TAKE 1 TABLET EVERY MORNING 90 tablet 3   clonazePAM (KLONOPIN) 0.5 MG tablet TAKE 1/2 TABLET IN THE MORNING AND 2 AT BEDTIME 225 tablet 0   escitalopram (LEXAPRO) 10 MG tablet TAKE 1 TABLET DAILY 90 tablet 3   [name] 0.35 MG tablet TAKE 1 TABLET DAILY 84 tablet 3   labetalol (NORMODYNE) 100 MG tablet Take 1 tablet (100 mg total) by mouth 2 (two) times daily. 180 tablet 3   spironolactone (ALDACTONE) 25 MG tablet TAKE 1 TABLET TWICE A DAY 180 tablet 3   No current facility-administered medications on file prior to visit.     PAST MEDICAL HISTORY: Past Medical History:  Diagnosis Date   Anxiety    Depression    Dry mouth    Fibroids    Gallbladder disease    High cholesterol    HTN (hypertension) 2007   seen in ER  with headaches   Hypertension    Hypokalemia 2007   due to HCTZ   Nervousness    Obesity    Stress    Swelling of lower extremity    Trouble in sleeping     PAST SURGICAL HISTORY: Past Surgical History:  Procedure Laterality Date   arm surgery     post trauma @ age 89   BREAST SURGERY Right 2016   BREAST BX    CHOLECYSTECTOMY     gall stones   WISDOM TOOTH EXTRACTION      SOCIAL HISTORY: Social History   Tobacco Use   Smoking status: Never Smoker   Smokeless tobacco: Never Used    Substance Use Topics   Alcohol use: No   Drug use: No    FAMILY HISTORY: Family History  Problem Relation Age of Onset   Cancer Maternal Grandmother        thyroid   Hypertension Maternal Grandmother    Hyperlipidemia Mother    Hypertension Mother    Depression Mother    Anxiety disorder Mother    Obesity Mother    Breast cancer Maternal Aunt 87   Kidney disease Maternal Aunt         X 2; 1 on Dialysis   Cancer Maternal Aunt        ? primary; also had HTN   Prostate cancer Paternal Uncle          3 uncles   Prostate cancer Father    Cancer Father    Hypertension Paternal Grandmother  Hypertension Maternal Aunt        X 2   Bipolar disorder Paternal Aunt    Stroke Neg Hx    Diabetes Neg Hx    Heart disease Neg Hx    Colon cancer Neg Hx     ROS: Review of Systems  Constitutional: Negative for weight loss.  Respiratory: Negative for shortness of breath (on exertion).   Cardiovascular: Negative for chest pain.  Gastrointestinal: Negative for nausea and vomiting.  Musculoskeletal:       Negative for muscle weakness    PHYSICAL EXAM: Blood pressure 103/70, pulse 79, temperature 98.2 F (36.8 C), temperature source Oral, height 5\' 7"  (1.702 m), weight 232 lb (105.2 kg), SpO2 100 %. Body mass index is 36.34 kg/m. Physical Exam Vitals signs reviewed.  Constitutional:      Appearance: Normal appearance. She is well-developed. She is obese.  Cardiovascular:     Rate and Rhythm: Normal rate.  Pulmonary:     Effort: Pulmonary effort is normal.  Musculoskeletal: Normal range of motion.  Skin:    General: Skin is warm and dry.  Neurological:     Mental Status: She is alert and oriented to person, place, and time.  Psychiatric:        Mood and Affect: Mood normal.        Behavior: Behavior normal.     RECENT LABS AND TESTS: BMET    Component Value Date/Time   NA 143 12/08/2018 0820   K 4.1 12/08/2018 0820   CL 109 (H) 12/08/2018  0820   CO2 20 12/08/2018 0820   GLUCOSE 86 12/08/2018 0820   GLUCOSE 92 09/03/2018 0929   BUN 12 12/08/2018 0820   CREATININE 1.35 (H) 12/08/2018 0820   CREATININE 1.28 (H) 02/19/2017 0851   CALCIUM 9.4 12/08/2018 0820   GFRNONAA 45 (L) 12/08/2018 0820   GFRAA 52 (L) 12/08/2018 0820   Lab Results  Component Value Date   HGBA1C 5.5 12/08/2018   HGBA1C 5.9 (H) 05/05/2018   HGBA1C 5.4 04/19/2015   HGBA1C 5.7 02/15/2013   HGBA1C 5.4 12/03/2010   Lab Results  Component Value Date   INSULIN 15.4 12/08/2018   INSULIN 18.3 05/05/2018   CBC    Component Value Date/Time   WBC 9.2 09/03/2018 0929   RBC 4.50 09/03/2018 0929   HGB 12.5 09/03/2018 0929   HGB 12.8 05/05/2018 1252   HCT 38.8 09/03/2018 0929   HCT 40.4 05/05/2018 1252   PLT 291.0 09/03/2018 0929   MCV 86.3 09/03/2018 0929   MCV 86 05/05/2018 1252   MCH 27.1 05/05/2018 1252   MCH 27.5 11/02/2017 0839   MCHC 32.2 09/03/2018 0929   RDW 15.5 09/03/2018 0929   RDW 13.6 05/05/2018 1252   LYMPHSABS 1.9 09/03/2018 0929   LYMPHSABS 2.1 05/05/2018 1252   MONOABS 0.6 09/03/2018 0929   EOSABS 0.1 09/03/2018 0929   EOSABS 0.1 05/05/2018 1252   BASOSABS 0.0 09/03/2018 0929   BASOSABS 0.0 05/05/2018 1252   Iron/TIBC/Ferritin/ %Sat No results found for: IRON, TIBC, FERRITIN, IRONPCTSAT Lipid Panel     Component Value Date/Time   CHOL 148 09/03/2018 0929   CHOL 160 05/05/2018 1252   TRIG 74.0 09/03/2018 0929   HDL 45.50 09/03/2018 0929   HDL 54 05/05/2018 1252   CHOLHDL 3 09/03/2018 0929   VLDL 14.8 09/03/2018 0929   LDLCALC 87 09/03/2018 0929   LDLCALC 93 05/05/2018 1252   LDLCALC 129 (H) 02/19/2017 0851   Hepatic Function Panel  Component Value Date/Time   PROT 6.4 12/08/2018 0820   ALBUMIN 4.0 12/08/2018 0820   AST 18 12/08/2018 0820   ALT 23 12/08/2018 0820   ALKPHOS 142 (H) 12/08/2018 0820   BILITOT 0.4 12/08/2018 0820   BILIDIR 0.1 04/20/2014 0804      Component Value Date/Time   TSH 2.31  09/03/2018 0929   TSH 1.250 05/05/2018 1252   TSH 1.94 06/08/2017 1343     Ref. Range 12/08/2018 08:20  Vitamin D, 25-Hydroxy Latest Ref Range: 30.0 - 100.0 ng/mL 46.7    OBESITY BEHAVIORAL INTERVENTION VISIT  Today's visit was # 17   Starting weight: 252 lbs Starting date: 05/05/2018 Today's weight : 232 lbs Today's date: 01/13/2019 Total lbs lost to date: 20    01/13/2019  Height 5\' 7"  (1.702 m)  Weight 232 lb (105.2 kg)  BMI (Calculated) 36.33  BLOOD PRESSURE - SYSTOLIC [id number]  BLOOD PRESSURE - DIASTOLIC 70   Body Fat % 42 %  Total Body Water (lbs) 91.4 lbs    ASK: We discussed the diagnosis of obesity with Myna Hidalgo today and Madelinne agreed to give [place] permission to discuss obesity behavioral modification therapy today.  ASSESS: Marianita has the diagnosis of obesity and her BMI today is 36.33 Irina is in the action stage of change   ADVISE: Brene was educated on the multiple health risks of obesity as well as the benefit of weight loss to improve her health. She was advised of the need for long term treatment and the importance of lifestyle modifications to improve her current health and to decrease her risk of future health problems.  AGREE: Multiple dietary modification options and treatment options were discussed and  Lucila agreed to follow the recommendations documented in the above note.  ARRANGE: Allysa was educated on the importance of frequent visits to treat obesity as outlined per CMS and USPSTF guidelines and agreed to schedule her next follow up appointment today.  Corey Skains, am acting as Location manager for [employer], FNP-C.  I have reviewed the above documentation for accuracy and completeness, and I agree with the above.  - Deanglo Hissong, FNP-C.

## 2019-01-18 ENCOUNTER — Other Ambulatory Visit: Payer: Self-pay | Admitting: Psychiatry

## 2019-01-19 NOTE — Telephone Encounter (Signed)
Apt 12/15 90 day refill

## 2019-01-20 ENCOUNTER — Ambulatory Visit (INDEPENDENT_AMBULATORY_CARE_PROVIDER_SITE_OTHER): Payer: BC Managed Care – PPO | Admitting: Psychology

## 2019-01-20 DIAGNOSIS — F339 Major depressive disorder, recurrent, unspecified: Secondary | ICD-10-CM

## 2019-02-02 ENCOUNTER — Other Ambulatory Visit: Payer: Self-pay

## 2019-02-02 DIAGNOSIS — N920 Excessive and frequent menstruation with regular cycle: Secondary | ICD-10-CM

## 2019-02-02 NOTE — Telephone Encounter (Signed)
Per [facility] =Lupron available in limited supply. Hit or miss depending on if the specialty pharmacy her insurance co. Assigns her to still has any left from the shipment that was received.

## 2019-02-08 ENCOUNTER — Ambulatory Visit (INDEPENDENT_AMBULATORY_CARE_PROVIDER_SITE_OTHER): Payer: BC Managed Care – PPO | Admitting: Psychology

## 2019-02-08 DIAGNOSIS — F339 Major depressive disorder, recurrent, unspecified: Secondary | ICD-10-CM

## 2019-02-10 ENCOUNTER — Telehealth (INDEPENDENT_AMBULATORY_CARE_PROVIDER_SITE_OTHER): Payer: BC Managed Care – PPO | Admitting: Family Medicine

## 2019-02-10 ENCOUNTER — Other Ambulatory Visit: Payer: Self-pay

## 2019-02-10 ENCOUNTER — Encounter (INDEPENDENT_AMBULATORY_CARE_PROVIDER_SITE_OTHER): Payer: Self-pay | Admitting: Family Medicine

## 2019-02-10 DIAGNOSIS — Z6836 Body mass index (BMI) 36.0-36.9, adult: Secondary | ICD-10-CM

## 2019-02-10 DIAGNOSIS — F3289 Other specified depressive episodes: Secondary | ICD-10-CM | POA: Diagnosis not present

## 2019-02-14 NOTE — Progress Notes (Signed)
Office: [phone]  /  Fax: [phone] TeleHealth Visit:  Cheryl Owens has verbally consented to this TeleHealth visit today. The patient is located at home, the provider is located at the [facility] office. The participants in this visit include the listed provider and patient and any and all parties involved. The visit was conducted today via WebEx.  HPI:   Chief Complaint: OBESITY Cheryl Owens is here to discuss her progress with her obesity treatment plan. She is on the Category 3 plan and is following her eating plan approximately 30 % of the time. She states she is exercising 0 minutes 0 times per week. Kamaiyah feels she has gained weight. She has been mostly off the plan. She still feels journaling is her best option after a discussion about plans. We were unable to weigh the patient today for this TeleHealth visit. She feels as if she has gained weight since her last visit (weight not reported). She has lost 20 lbs since starting treatment with [place].  Depression with emotional eating behaviors Cheryl Owens is seeing a psychologist (she was referred by Dr. Mallie Mussel). Cheryl Owens is struggling significantly with emotional eating. Cheryl Owens is on Bupropion per psychiatry. She is using food for comfort to the extent that it is negatively impacting her health. She often snacks when she is not hungry. Cheryl Owens sometimes feels she is out of control and then feels guilty that she made poor food choices. She has been working on behavior modification techniques to help reduce her emotional eating and has been somewhat successful. She shows no sign of suicidal or homicidal ideations.  ASSESSMENT AND PLAN:  Other depression, emotional eating  Class 2 severe obesity with serious comorbidity and body mass index (BMI) of 36.0 to 36.9 in adult, unspecified obesity type (Cheryl Owens)  PLAN:  Emotional Eating Behaviors (other depression) We discussed behavior modification techniques today to help Cheryl Owens deal with her emotional  eating behaviors. She will continue to see the counselor and she will continue Bupropion (Wellbutrin XL) 300 mg daily. Cheryl Owens agrees to follow up as directed.  Obesity Cheryl Owens is currently in the action stage of change. As such, her goal is to continue with weight loss efforts She has agreed to keep a food journal with 1450 to 1600 calories and 100 grams of protein daily Cheryl Owens will increase walking for weight loss and overall health benefits. We discussed the following Behavioral Modification Strategies today: planning for success, increase H2O intake, keep a strict food journal, increasing lean protein intake and decreasing simple carbohydrates   I encouraged her to weight at home once weekly. Handouts were sent to patient via MyChart for eating out.  Cheryl Owens has agreed to follow up with our clinic in 2 weeks. She was informed of the importance of frequent follow up visits to maximize her success with intensive lifestyle modifications for her multiple health conditions.  ALLERGIES: Allergies  Allergen Reactions  . Hydrochlorothiazide     REACTION: hypokalemia  . Benazepril     Nausea& vomiting, abd cramps    MEDICATIONS: Current Outpatient Medications on File Prior to Visit  Medication Sig Dispense Refill  . atorvastatin (LIPITOR) 20 MG tablet TAKE 1 TABLET DAILY 90 tablet 3  . buPROPion (WELLBUTRIN XL) 300 MG 24 hr tablet TAKE 1 TABLET EVERY MORNING 90 tablet 3  . clonazePAM (KLONOPIN) 0.5 MG tablet TAKE ONE-HALF (1/2) TABLET IN THE MORNING AND 2 TABLETS AT BEDTIME 225 tablet 0  . escitalopram (LEXAPRO) 10 MG tablet TAKE 1 TABLET DAILY  90 tablet 3  . [name] 0.35 MG tablet TAKE 1 TABLET DAILY 84 tablet 3  . labetalol (NORMODYNE) 100 MG tablet Take 1 tablet (100 mg total) by mouth 2 (two) times daily. 180 tablet 3  . spironolactone (ALDACTONE) 25 MG tablet TAKE 1 TABLET TWICE A DAY 180 tablet 3  . Vitamin D, Ergocalciferol, (DRISDOL) 1.25 MG (50000 UT) CAPS capsule Take 1 capsule (50,000 Units  total) by mouth every 7 (seven) days. 12 capsule 0   No current facility-administered medications on file prior to visit.     PAST MEDICAL HISTORY: Past Medical History:  Diagnosis Date  . Anxiety   . Depression   . Dry mouth   . Fibroids   . Gallbladder disease   . High cholesterol   . HTN (hypertension) 2007   seen in ER  with headaches  . Hypertension   . Hypokalemia 2007   due to HCTZ  . Nervousness   . Obesity   . Stress   . Swelling of lower extremity   . Trouble in sleeping     PAST SURGICAL HISTORY: Past Surgical History:  Procedure Laterality Date  . arm surgery     post trauma @ age 50  . BREAST SURGERY Right 2016   BREAST BX   . CHOLECYSTECTOMY     gall stones  . WISDOM TOOTH EXTRACTION      SOCIAL HISTORY: Social History   Tobacco Use  . Smoking status: Never Smoker  . Smokeless tobacco: Never Used  Substance Use Topics  . Alcohol use: No  . Drug use: No    FAMILY HISTORY: Family History  Problem Relation Age of Onset  . Cancer Maternal Grandmother        thyroid  . Hypertension Maternal Grandmother   . Hyperlipidemia Mother   . Hypertension Mother   . Depression Mother   . Anxiety disorder Mother   . Obesity Mother   . Breast cancer Maternal Aunt 51  . Kidney disease Maternal Aunt         X 2; 1 on Dialysis  . Cancer Maternal Aunt        ? primary; also had HTN  . Prostate cancer Paternal Uncle          3 uncles  . Prostate cancer Father   . Cancer Father   . Hypertension Paternal Grandmother   . Hypertension Maternal Aunt        X 2  . Bipolar disorder Paternal Aunt   . Stroke Neg Hx   . Diabetes Neg Hx   . Heart disease Neg Hx   . Colon cancer Neg Hx     ROS: Review of Systems  Constitutional: Negative for weight loss.  Psychiatric/Behavioral: Positive for depression. Negative for suicidal ideas.    PHYSICAL EXAM: Pt in no acute distress  RECENT LABS AND TESTS: BMET    Component Value Date/Time   NA 143  12/08/2018 0820   K 4.1 12/08/2018 0820   CL 109 (H) 12/08/2018 0820   CO2 20 12/08/2018 0820   GLUCOSE 86 12/08/2018 0820   GLUCOSE 92 09/03/2018 0929   BUN 12 12/08/2018 0820   CREATININE 1.35 (H) 12/08/2018 0820   CREATININE 1.28 (H) 02/19/2017 0851   CALCIUM 9.4 12/08/2018 0820   GFRNONAA 45 (L) 12/08/2018 0820   GFRAA 52 (L) 12/08/2018 0820   Lab Results  Component Value Date   HGBA1C 5.5 12/08/2018   HGBA1C 5.9 (H) 05/05/2018   HGBA1C  5.4 04/19/2015   HGBA1C 5.7 02/15/2013   HGBA1C 5.4 12/03/2010   Lab Results  Component Value Date   INSULIN 15.4 12/08/2018   INSULIN 18.3 05/05/2018   CBC    Component Value Date/Time   WBC 9.2 09/03/2018 0929   RBC 4.50 09/03/2018 0929   HGB 12.5 09/03/2018 0929   HGB 12.8 05/05/2018 1252   HCT 38.8 09/03/2018 0929   HCT 40.4 05/05/2018 1252   PLT 291.0 09/03/2018 0929   MCV 86.3 09/03/2018 0929   MCV 86 05/05/2018 1252   MCH 27.1 05/05/2018 1252   MCH 27.5 11/02/2017 0839   MCHC 32.2 09/03/2018 0929   RDW 15.5 09/03/2018 0929   RDW 13.6 05/05/2018 1252   LYMPHSABS 1.9 09/03/2018 0929   LYMPHSABS 2.1 05/05/2018 1252   MONOABS 0.6 09/03/2018 0929   EOSABS 0.1 09/03/2018 0929   EOSABS 0.1 05/05/2018 1252   BASOSABS 0.0 09/03/2018 0929   BASOSABS 0.0 05/05/2018 1252   Iron/TIBC/Ferritin/ %Sat No results found for: IRON, TIBC, FERRITIN, IRONPCTSAT Lipid Panel     Component Value Date/Time   CHOL 148 09/03/2018 0929   CHOL 160 05/05/2018 1252   TRIG 74.0 09/03/2018 0929   HDL 45.50 09/03/2018 0929   HDL 54 05/05/2018 1252   CHOLHDL 3 09/03/2018 0929   VLDL 14.8 09/03/2018 0929   LDLCALC 87 09/03/2018 0929   LDLCALC 93 05/05/2018 1252   LDLCALC 129 (H) 02/19/2017 0851   Hepatic Function Panel     Component Value Date/Time   PROT 6.4 12/08/2018 0820   ALBUMIN 4.0 12/08/2018 0820   AST 18 12/08/2018 0820   ALT 23 12/08/2018 0820   ALKPHOS 142 (H) 12/08/2018 0820   BILITOT 0.4 12/08/2018 0820   BILIDIR 0.1  04/20/2014 0804      Component Value Date/Time   TSH 2.31 09/03/2018 0929   TSH 1.250 05/05/2018 1252   TSH 1.94 06/08/2017 1343     Ref. Range 12/08/2018 08:20  Vitamin D, 25-Hydroxy Latest Ref Range: 30.0 - 100.0 ng/mL 46.7    I, Doreene Nest, am acting as Location manager for [employer], FNP-C.  I have reviewed the above documentation for accuracy and completeness, and I agree with the above.  - Mohamad Bruso, FNP-C.

## 2019-02-22 ENCOUNTER — Ambulatory Visit (INDEPENDENT_AMBULATORY_CARE_PROVIDER_SITE_OTHER): Payer: BC Managed Care – PPO | Admitting: Psychiatry

## 2019-02-22 ENCOUNTER — Encounter: Payer: Self-pay | Admitting: Psychiatry

## 2019-02-22 DIAGNOSIS — F3342 Major depressive disorder, recurrent, in full remission: Secondary | ICD-10-CM | POA: Diagnosis not present

## 2019-02-22 DIAGNOSIS — F411 Generalized anxiety disorder: Secondary | ICD-10-CM

## 2019-02-22 DIAGNOSIS — F5105 Insomnia due to other mental disorder: Secondary | ICD-10-CM

## 2019-02-22 DIAGNOSIS — F4001 Agoraphobia with panic disorder: Secondary | ICD-10-CM | POA: Diagnosis not present

## 2019-02-22 NOTE — Progress Notes (Signed)
TAKAKO ASP [id number] 07/09/1965 53 y.o.  Subjective:   Patient ID:  Cheryl Owens is a 53 y.o. (DOB 01/18/1966) female.  Chief Complaint:  Chief Complaint  Patient presents with  . Follow-up    Medication Management  . Depression    Medication Management    Depression        Associated symptoms include no decreased concentration and no suicidal ideas.  Cheryl Owens presents to the office today for follow-up of depression and anxiety and history of insomnia and Bz withdrawal.    Last seen in June 2020.  No meds were changed.  She remained on Wellbutrin XL 300 mg, Lexapro 10 mg, vitamin D, and clonazepam 0.5 mg twice daily as needed.  Usually takes clonazepam 0.5 mg 1/2 AM and 1 in PM.  Working from home 3 mos.  Likes it.  Better with weight control.  Doing Healthy Weight and Wellness including doctor including mental health provider and counseling to help weight loss.    Work life balance is better overall.  Patient reports stable mood and denies depressed or irritable moods.  Patient denies any recent difficulty with anxiety.  Patient denies difficulty with sleep initiation or maintenance. 6 hours; hard to let go of phone.  Denies appetite disturbance.  Patient reports that energy and motivation have been good.  Patient denies any difficulty with concentration.  Patient denies any suicidal ideation. No panic.  Hx noncompliance bc forgets.  Is using a pill box, and is aware if she forgets it.  Fearful to decrease meds.  Past Psychiatric Medication Trials:  Wellbutrin, clonazepam,  Adderall, Lexapro 10  Review of Systems:  Review of Systems  Neurological: Negative for tremors and weakness.  Psychiatric/Behavioral: Positive for depression. Negative for agitation, behavioral problems, confusion, decreased concentration, dysphoric mood, hallucinations, self-injury, sleep disturbance and suicidal ideas. The patient is not nervous/anxious and is not hyperactive.     Medications: I  have reviewed the patient's current medications.  Current Outpatient Medications  Medication Sig Dispense Refill  . atorvastatin (LIPITOR) 20 MG tablet TAKE 1 TABLET DAILY 90 tablet 3  . buPROPion (WELLBUTRIN XL) 300 MG 24 hr tablet TAKE 1 TABLET EVERY MORNING 90 tablet 3  . clonazePAM (KLONOPIN) 0.5 MG tablet TAKE ONE-HALF (1/2) TABLET IN THE MORNING AND 2 TABLETS AT BEDTIME (Patient taking differently: TAKE ONE-HALF (1/2) TABLET IN THE MORNING AND 1 TABLETS AT BEDTIME) 225 tablet 0  . escitalopram (LEXAPRO) 10 MG tablet TAKE 1 TABLET DAILY 90 tablet 3  . [name] 0.35 MG tablet TAKE 1 TABLET DAILY 84 tablet 3  . labetalol (NORMODYNE) 100 MG tablet Take 1 tablet (100 mg total) by mouth 2 (two) times daily. 180 tablet 3  . spironolactone (ALDACTONE) 25 MG tablet TAKE 1 TABLET TWICE A DAY 180 tablet 3  . Vitamin D, Ergocalciferol, (DRISDOL) 1.25 MG (50000 UT) CAPS capsule Take 1 capsule (50,000 Units total) by mouth every 7 (seven) days. 12 capsule 0   No current facility-administered medications for this visit.    Medication Side Effects: None  Allergies:  Allergies  Allergen Reactions  . Hydrochlorothiazide     REACTION: hypokalemia  . Benazepril     Nausea& vomiting, abd cramps    Past Medical History:  Diagnosis Date  . Anxiety   . Depression   . Dry mouth   . Fibroids   . Gallbladder disease   . High cholesterol   . HTN (hypertension) 2007   seen in ER  with  headaches  . Hypertension   . Hypokalemia 2007   due to HCTZ  . Nervousness   . Obesity   . Stress   . Swelling of lower extremity   . Trouble in sleeping     Family History  Problem Relation Age of Onset  . Cancer Maternal Grandmother        thyroid  . Hypertension Maternal Grandmother   . Hyperlipidemia Mother   . Hypertension Mother   . Depression Mother   . Anxiety disorder Mother   . Obesity Mother   . Breast cancer Maternal Aunt 51  . Kidney disease Maternal Aunt         X 2; 1 on Dialysis  .  Cancer Maternal Aunt        ? primary; also had HTN  . Prostate cancer Paternal Uncle          3 uncles  . Prostate cancer Father   . Cancer Father   . Hypertension Paternal Grandmother   . Hypertension Maternal Aunt        X 2  . Bipolar disorder Paternal Aunt   . Stroke Neg Hx   . Diabetes Neg Hx   . Heart disease Neg Hx   . Colon cancer Neg Hx     Social History   Socioeconomic History  . Marital status: Single    Spouse name: Not on file  . Number of children: 1  . Years of education: Not on file  . Highest education level: Not on file  Occupational History  . Occupation: Cabin crew  Tobacco Use  . Smoking status: Never Smoker  . Smokeless tobacco: Never Used  Substance and Sexual Activity  . Alcohol use: No  . Drug use: No  . Sexual activity: Not Currently    Comment: 1st intercourse- 15, partners- 10  Other Topics Concern  . Not on file  Social History Narrative  . Not on file   Social Determinants of Health   Financial Resource Strain:   . Difficulty of Paying Living Expenses: Not on file  Food Insecurity:   . Worried About Charity fundraiser in the Last Year: Not on file  . Ran Out of Food in the Last Year: Not on file  Transportation Needs:   . Lack of Transportation (Medical): Not on file  . Lack of Transportation (Non-Medical): Not on file  Physical Activity:   . Days of Exercise per Week: Not on file  . Minutes of Exercise per Session: Not on file  Stress:   . Feeling of Stress : Not on file  Social Connections:   . Frequency of Communication with Friends and Family: Not on file  . Frequency of Social Gatherings with Friends and Family: Not on file  . Attends Religious Services: Not on file  . Active Member of Clubs or Organizations: Not on file  . Attends Archivist Meetings: Not on file  . Marital Status: Not on file  Intimate Partner Violence:   . Fear of Current or Ex-Partner: Not on file  . Emotionally Abused: Not on file   . Physically Abused: Not on file  . Sexually Abused: Not on file    Past Medical History, Surgical history, Social history, and Family history were reviewed and updated as appropriate.   Please see review of systems for further details on the patient's review from today.   Objective:   Physical Exam:  There were no vitals taken for this visit.  Physical Exam Constitutional:      General: She is not in acute distress.    Appearance: She is well-developed.  Musculoskeletal:        General: No deformity.  Neurological:     Mental Status: She is alert and oriented to person, place, and time.     Motor: No tremor.     Coordination: Coordination normal.     Gait: Gait normal.  Psychiatric:        Attention and Perception: She is attentive. She does not perceive auditory or visual hallucinations.        Mood and Affect: Mood is not depressed. Affect is not labile, blunt, angry or inappropriate.        Speech: Speech normal.        Behavior: Behavior normal.        Thought Content: Thought content normal. Thought content does not include homicidal or suicidal ideation. Thought content does not include homicidal or suicidal plan.        Cognition and Memory: Cognition normal.        Judgment: Judgment normal.     Comments: Insight intact. No auditory or visual hallucinations. No delusions.  Residual anxiety is manageable     Lab Review:     Component Value Date/Time   NA 143 12/08/2018 0820   K 4.1 12/08/2018 0820   CL 109 (H) 12/08/2018 0820   CO2 20 12/08/2018 0820   GLUCOSE 86 12/08/2018 0820   GLUCOSE 92 09/03/2018 0929   BUN 12 12/08/2018 0820   CREATININE 1.35 (H) 12/08/2018 0820   CREATININE 1.28 (H) 02/19/2017 0851   CALCIUM 9.4 12/08/2018 0820   PROT 6.4 12/08/2018 0820   ALBUMIN 4.0 12/08/2018 0820   AST 18 12/08/2018 0820   ALT 23 12/08/2018 0820   ALKPHOS 142 (H) 12/08/2018 0820   BILITOT 0.4 12/08/2018 0820   GFRNONAA 45 (L) 12/08/2018 0820   GFRAA 52  (L) 12/08/2018 0820       Component Value Date/Time   WBC 9.2 09/03/2018 0929   RBC 4.50 09/03/2018 0929   HGB 12.5 09/03/2018 0929   HGB 12.8 05/05/2018 1252   HCT 38.8 09/03/2018 0929   HCT 40.4 05/05/2018 1252   PLT 291.0 09/03/2018 0929   MCV 86.3 09/03/2018 0929   MCV 86 05/05/2018 1252   MCH 27.1 05/05/2018 1252   MCH 27.5 11/02/2017 0839   MCHC 32.2 09/03/2018 0929   RDW 15.5 09/03/2018 0929   RDW 13.6 05/05/2018 1252   LYMPHSABS 1.9 09/03/2018 0929   LYMPHSABS 2.1 05/05/2018 1252   MONOABS 0.6 09/03/2018 0929   EOSABS 0.1 09/03/2018 0929   EOSABS 0.1 05/05/2018 1252   BASOSABS 0.0 09/03/2018 0929   BASOSABS 0.0 05/05/2018 1252    No results found for: POCLITH, LITHIUM   No results found for: PHENYTOIN, PHENOBARB, VALPROATE, CBMZ   Corrected low vitamin D 2020  .res Assessment: Plan:    Orianna was seen today for follow-up and depression.  Diagnoses and all orders for this visit:  Depression, major, recurrent, in complete remission ([place])  Panic disorder with agoraphobia  Generalized anxiety disorder  Insomnia due to mental condition    Greater than 50% of face to face time with patient was spent on counseling and coordination of care. We discussed Still sleep deprived but no longer DT work Schedule.  Discussed trying to get more sleep.  Could also help with her weight loss which is a goal.  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  Mood is therefore better. Discussed meds and SE.  OK bz for flying phobia as needed.    Chronically a bit sleep deprived bc stays up too late too much.  Encourage protect it.  Not drowsy daytime.  We discussed the short-term risks associated with benzodiazepines including sedation and increased fall risk among others.  Discussed long-term side effect risk including dependence, potential withdrawal symptoms, and the potential eventual dose-related risk of dementia. She feels that she is on the  lowest effective dose.  No med changes indicated.  Per her request .  Doing well   This appt was 30 mins.  FU 6 mos.  Lynder Parents, MD, DFAPA   Please see After Visit Summary for patient specific instructions.  Future Appointments  Date Time Provider [place]  02/24/2019  4:15 PM [place], Joneen Boers, FNP [facility] None  02/28/2019 12:00 PM Natividad Brood, LCSW [facility] None  03/16/2019  9:00 AM Natividad Brood, LCSW [facility] None    No orders of the defined types were placed in this encounter.     -------------------------------

## 2019-02-24 ENCOUNTER — Other Ambulatory Visit: Payer: Self-pay

## 2019-02-24 ENCOUNTER — Telehealth (INDEPENDENT_AMBULATORY_CARE_PROVIDER_SITE_OTHER): Payer: BC Managed Care – PPO | Admitting: Family Medicine

## 2019-02-24 ENCOUNTER — Encounter (INDEPENDENT_AMBULATORY_CARE_PROVIDER_SITE_OTHER): Payer: Self-pay | Admitting: Family Medicine

## 2019-02-24 DIAGNOSIS — Z6836 Body mass index (BMI) 36.0-36.9, adult: Secondary | ICD-10-CM | POA: Diagnosis not present

## 2019-02-24 DIAGNOSIS — E559 Vitamin D deficiency, unspecified: Secondary | ICD-10-CM | POA: Diagnosis not present

## 2019-02-24 DIAGNOSIS — R7303 Prediabetes: Secondary | ICD-10-CM | POA: Diagnosis not present

## 2019-02-24 MED ORDER — METFORMIN HCL 500 MG PO TABS: 500.0000 mg | ORAL_TABLET | Freq: Every day | ORAL | 0 refills | Status: DC

## 2019-02-28 ENCOUNTER — Ambulatory Visit (INDEPENDENT_AMBULATORY_CARE_PROVIDER_SITE_OTHER): Payer: BC Managed Care – PPO | Admitting: Psychology

## 2019-02-28 DIAGNOSIS — F339 Major depressive disorder, recurrent, unspecified: Secondary | ICD-10-CM

## 2019-03-01 NOTE — Progress Notes (Signed)
Office: [phone]  /  Fax: [phone] TeleHealth Visit:  Cheryl Owens has verbally consented to this TeleHealth visit today. The patient is located at home, the provider is located at the [employer] [facility] office. The participants in this visit include the listed provider and patient and any and all parties involved. The visit was conducted today via telephone. [name] was unable to use realtime audiovisual technology today and the telehealth visit was conducted via telephone (time spent on call 20 minutes, 4:15 PM-4:35 PM).  HPI:  Chief Complaint: OBESITY Cheryl Owens is here to discuss her progress with her obesity treatment plan. She is on the keep a food journal with 2000 calories and 175 grams of protein daily plan and states she is following her eating plan approximately 30 % of the time. She states she is exercising 0 minutes 0 times per week.   Cheryl Owens feels she has gained weight, but she has not weighed. She is struggling to stay on the plan. She has recently started jogging again. Cheryl Owens is getting 70 to 75 grams of protein daily. She reports eating around 2,000 calories a day.  Pre-Diabetes Cheryl Owens has a diagnosis of prediabetes and she admits polyphagia. She is not currently on metformin. Cherrill struggles with hunger after dinner.  Lab Results  Component Value Date   HGBA1C 5.5 12/08/2018    Vitamin D deficiency Cheryl Owens has a diagnosis of vitamin D deficiency. Her last vitamin D level was at 46.7 (12/08/18) and was nearly at goal. She is taking Rx vit D.  ASSESSMENT AND PLAN:  Prediabetes - Plan: metFORMIN (GLUCOPHAGE) 500 MG tablet  Vitamin D deficiency  Class 2 severe obesity with serious comorbidity and body mass index (BMI) of 36.0 to 36.9 in adult, unspecified obesity type [facility])  PLAN:  Pre-Diabetes Cheryl Owens agrees to start metformin 500 mg daily with dinner #30 with no refills. She will continue to work on weight loss, exercise, and decreasing simple carbohydrates to help  decrease the risk of diabetes. Brexleigh agrees to follow up with our clinic in 4 weeks.  Vitamin D Deficiency Low vitamin D level contributes to fatigue and are associated with obesity, breast, and colon cancer. Cheryl Owens will continue to take prescription Vit D @50 ,000 IU every week (no refill needed) and she will follow up for routine testing of vitamin D, at least 2-3 times per year to avoid over-replacement.  Obesity Cheryl Owens is currently in the action stage of change. As such, her goal is to continue with weight loss efforts She has agreed to keep a food journal with 1450 to 1600 calories and 100 grams of protein daily Cheryl Owens has been instructed to work up to a goal of 150 minutes of combined cardio and strengthening exercise per week for weight loss and overall health benefits. We discussed the following Behavioral Modification Strategies today: planning for success, better snacking choices, keep a strict food journal and increasing lean protein intake  Cheryl Owens has agreed to follow up with our clinic in 4 weeks. She was informed of the importance of frequent follow up visits to maximize her success with intensive lifestyle modifications for her multiple health conditions.  ALLERGIES: Allergies  Allergen Reactions  . Hydrochlorothiazide     REACTION: hypokalemia  . Benazepril     Nausea& vomiting, abd cramps    MEDICATIONS: Current Outpatient Medications on File Prior to Visit  Medication Sig Dispense Refill  . atorvastatin (LIPITOR) 20 MG tablet TAKE 1 TABLET DAILY 90 tablet 3  . buPROPion (  WELLBUTRIN XL) 300 MG 24 hr tablet TAKE 1 TABLET EVERY MORNING 90 tablet 3  . clonazePAM (KLONOPIN) 0.5 MG tablet TAKE ONE-HALF (1/2) TABLET IN THE MORNING AND 2 TABLETS AT BEDTIME (Patient taking differently: TAKE ONE-HALF (1/2) TABLET IN THE MORNING AND 1 TABLETS AT BEDTIME) 225 tablet 0  . escitalopram (LEXAPRO) 10 MG tablet TAKE 1 TABLET DAILY 90 tablet 3  . Cheryl Owens 0.35 MG tablet TAKE 1 TABLET DAILY 84  tablet 3  . labetalol (NORMODYNE) 100 MG tablet Take 1 tablet (100 mg total) by mouth 2 (two) times daily. 180 tablet 3  . spironolactone (ALDACTONE) 25 MG tablet TAKE 1 TABLET TWICE A DAY 180 tablet 3  . Vitamin D, Ergocalciferol, (DRISDOL) 1.25 MG (50000 UT) CAPS capsule Take 1 capsule (50,000 Units total) by mouth every 7 (seven) days. 12 capsule 0   No current facility-administered medications on file prior to visit.    PAST MEDICAL HISTORY: Past Medical History:  Diagnosis Date  . Anxiety   . Depression   . Dry mouth   . Fibroids   . Gallbladder disease   . High cholesterol   . HTN (hypertension) 2007   seen in ER  with headaches  . Hypertension   . Hypokalemia 2007   due to HCTZ  . Nervousness   . Obesity   . Stress   . Swelling of lower extremity   . Trouble in sleeping     PAST SURGICAL HISTORY: Past Surgical History:  Procedure Laterality Date  . arm surgery     post trauma @ age 53  . BREAST SURGERY Right 2016   BREAST BX   . CHOLECYSTECTOMY     gall stones  . WISDOM TOOTH EXTRACTION      SOCIAL HISTORY: Social History   Tobacco Use  . Smoking status: Never Smoker  . Smokeless tobacco: Never Used  Substance Use Topics  . Alcohol use: No  . Drug use: No    FAMILY HISTORY: Family History  Problem Relation Age of Onset  . Cancer Maternal Grandmother        thyroid  . Hypertension Maternal Grandmother   . Hyperlipidemia Mother   . Hypertension Mother   . Depression Mother   . Anxiety disorder Mother   . Obesity Mother   . Breast cancer Maternal Aunt 51  . Kidney disease Maternal Aunt         X 2; 1 on Dialysis  . Cancer Maternal Aunt        ? primary; also had HTN  . Prostate cancer Paternal Uncle          3 uncles  . Prostate cancer Father   . Cancer Father   . Hypertension Paternal Grandmother   . Hypertension Maternal Aunt        X 2  . Bipolar disorder Paternal Aunt   . Stroke Neg Hx   . Diabetes Neg Hx   . Heart disease Neg Hx     . Colon cancer Neg Hx     ROS: Review of Systems  Constitutional: Negative for weight loss.  Endo/Heme/Allergies:       Positive for polyphagia    PHYSICAL EXAM: There were no vitals taken for this visit. There is no height or weight on file to calculate BMI. Physical Exam Vitals reviewed.  Constitutional:      General: She is not in acute distress.    Appearance: Normal appearance. She is well-developed. She is obese.  Cardiovascular:  Rate and Rhythm: Normal rate.  Pulmonary:     Effort: Pulmonary effort is normal.  Musculoskeletal:        General: Normal range of motion.  Skin:    General: Skin is warm and dry.  Neurological:     Mental Status: She is alert and oriented to person, place, and time.  Psychiatric:        Mood and Affect: Mood normal.        Behavior: Behavior normal.     RECENT LABS AND TESTS: BMET    Component Value Date/Time   NA 143 12/08/2018 0820   K 4.1 12/08/2018 0820   CL 109 (H) 12/08/2018 0820   CO2 20 12/08/2018 0820   GLUCOSE 86 12/08/2018 0820   GLUCOSE 92 09/03/2018 0929   BUN 12 12/08/2018 0820   CREATININE 1.35 (H) 12/08/2018 0820   CREATININE 1.28 (H) 02/19/2017 0851   CALCIUM 9.4 12/08/2018 0820   GFRNONAA 45 (L) 12/08/2018 0820   GFRAA 52 (L) 12/08/2018 0820   Lab Results  Component Value Date   HGBA1C 5.5 12/08/2018   HGBA1C 5.9 (H) 05/05/2018   HGBA1C 5.4 04/19/2015   HGBA1C 5.7 02/15/2013   HGBA1C 5.4 12/03/2010   Lab Results  Component Value Date   INSULIN 15.4 12/08/2018   INSULIN 18.3 05/05/2018   CBC    Component Value Date/Time   WBC 9.2 09/03/2018 0929   RBC 4.50 09/03/2018 0929   HGB 12.5 09/03/2018 0929   HGB 12.8 05/05/2018 1252   HCT 38.8 09/03/2018 0929   HCT 40.4 05/05/2018 1252   PLT 291.0 09/03/2018 0929   MCV 86.3 09/03/2018 0929   MCV 86 05/05/2018 1252   MCH 27.1 05/05/2018 1252   MCH 27.5 11/02/2017 0839   MCHC 32.2 09/03/2018 0929   RDW 15.5 09/03/2018 0929   RDW 13.6  05/05/2018 1252   LYMPHSABS 1.9 09/03/2018 0929   LYMPHSABS 2.1 05/05/2018 1252   MONOABS 0.6 09/03/2018 0929   EOSABS 0.1 09/03/2018 0929   EOSABS 0.1 05/05/2018 1252   BASOSABS 0.0 09/03/2018 0929   BASOSABS 0.0 05/05/2018 1252   Iron/TIBC/Ferritin/ %Sat No results found for: IRON, TIBC, FERRITIN, IRONPCTSAT Lipid Panel     Component Value Date/Time   CHOL 148 09/03/2018 0929   CHOL 160 05/05/2018 1252   TRIG 74.0 09/03/2018 0929   HDL 45.50 09/03/2018 0929   HDL 54 05/05/2018 1252   CHOLHDL 3 09/03/2018 0929   VLDL 14.8 09/03/2018 0929   LDLCALC 87 09/03/2018 0929   LDLCALC 93 05/05/2018 1252   LDLCALC 129 (H) 02/19/2017 0851   Hepatic Function Panel     Component Value Date/Time   PROT 6.4 12/08/2018 0820   ALBUMIN 4.0 12/08/2018 0820   AST 18 12/08/2018 0820   ALT 23 12/08/2018 0820   ALKPHOS 142 (H) 12/08/2018 0820   BILITOT 0.4 12/08/2018 0820   BILIDIR 0.1 04/20/2014 0804      Component Value Date/Time   TSH 2.31 09/03/2018 0929   TSH 1.250 05/05/2018 1252   TSH 1.94 06/08/2017 1343     Ref. Range 12/08/2018 08:20  Vitamin D, 25-Hydroxy Latest Ref Range: 30.0 - 100.0 ng/mL 46.7   I, Doreene Nest, am acting as Location manager for [employer], FNP-C.  I have reviewed the above documentation for accuracy and completeness, and I agree with the above.  - Child Campoy, FNP-C.

## 2019-03-06 DIAGNOSIS — R7303 Prediabetes: Secondary | ICD-10-CM | POA: Insufficient documentation

## 2019-03-16 ENCOUNTER — Ambulatory Visit (INDEPENDENT_AMBULATORY_CARE_PROVIDER_SITE_OTHER): Payer: BC Managed Care – PPO | Admitting: Psychology

## 2019-03-16 DIAGNOSIS — F339 Major depressive disorder, recurrent, unspecified: Secondary | ICD-10-CM

## 2019-03-17 ENCOUNTER — Telehealth: Payer: Self-pay

## 2019-03-17 NOTE — Telephone Encounter (Addendum)
Patient contacted you by My Chart email 02/01/19. However, she just now read the reply to it.  Rosemarie Ax will call her to schedule pelvic u/s and I am checking on availability of Lupron at this time.  You wrote " If really heavy bleeding, can attempt stopping it in the meantime with Megace 40 mg twice daily if no contraindication."  Patient wants to know what the contraindications are. She has hypertension. She would like Rx if you think she does not have contraindication to it.    Dr. Heide Spark still in shortage.  However, Lupaneta is available. Ok to order Lupaneta 11.25mg  for this patient?

## 2019-03-17 NOTE — Telephone Encounter (Signed)
Can start with Megace 40 mg PO BID until Lupaneta comes in.

## 2019-03-18 ENCOUNTER — Other Ambulatory Visit: Payer: Self-pay

## 2019-03-18 MED ORDER — MEGESTROL ACETATE 40 MG PO TABS: 40.0000 mg | ORAL_TABLET | Freq: Two times a day (BID) | ORAL | 1 refills | Status: DC

## 2019-03-18 NOTE — Telephone Encounter (Signed)
I called and spoke with patient. U/s scheduled. Rx sent for Megace 40 bid until Lupaneta Pack arrives.  Patient taking [name] 0.35 and states it is not helping at all. She is going to d/c that and start on the Megace 40mg  bid.  I will send order today to [facility] for her Lupaneta to start that process.  Patient aware of all of this.

## 2019-03-24 ENCOUNTER — Other Ambulatory Visit: Payer: Self-pay

## 2019-03-24 ENCOUNTER — Telehealth (INDEPENDENT_AMBULATORY_CARE_PROVIDER_SITE_OTHER): Payer: BC Managed Care – PPO | Admitting: Family Medicine

## 2019-03-24 DIAGNOSIS — Z6836 Body mass index (BMI) 36.0-36.9, adult: Secondary | ICD-10-CM | POA: Diagnosis not present

## 2019-03-24 DIAGNOSIS — F3289 Other specified depressive episodes: Secondary | ICD-10-CM

## 2019-03-24 DIAGNOSIS — R7303 Prediabetes: Secondary | ICD-10-CM | POA: Diagnosis not present

## 2019-03-24 MED ORDER — METFORMIN HCL 500 MG PO TABS: 500.0000 mg | ORAL_TABLET | Freq: Two times a day (BID) | ORAL | 0 refills | Status: DC

## 2019-03-28 ENCOUNTER — Other Ambulatory Visit (INDEPENDENT_AMBULATORY_CARE_PROVIDER_SITE_OTHER): Payer: Self-pay | Admitting: Family Medicine

## 2019-03-28 DIAGNOSIS — R7303 Prediabetes: Secondary | ICD-10-CM

## 2019-03-28 NOTE — Progress Notes (Signed)
TeleHealth Visit:  Due to the COVID-19 pandemic, this visit was completed with telemedicine (audio/video) technology to reduce patient and provider exposure as well as to preserve personal protective equipment.   Cheryl Owens has verbally consented to this TeleHealth visit. The patient is located at home, the provider is located at the [employer] [facility] office. The participants in this visit include the listed provider and patient and any and all parties involved. The visit was conducted today via WebEx.  Chief Complaint: OBESITY Cheryl Owens is here to discuss her progress with her obesity treatment plan along with follow-up of her obesity related diagnoses. Cheryl Owens is on the Category 3 Plan and states she is following her eating plan approximately 20% of the time. Cheryl Owens states she is exercising 0 minutes 0 times per week.  Today's visit was #: 20 Starting weight: 252 lbs Starting date: 05/05/2018  Interim History: Cheryl Owens reports her weight is 221 pounds at home today. She is not doing well on the plan (per her report). She is very discouraged and is "not really trying". She was recently placed on Megace for heavy periods which has increased her appetite. She is skipping meals and she is eating junk food.  Subjective:   Prediabetes  [name] has a diagnosis of prediabetes based on her elevated Hgb A1c and was informed this puts her at greater risk of developing diabetes. [name] was prescribed metformin at the last visit, and she notices no decrease in hunger. She has been taking it 2 times per day by mistake. She continues to work on diet and exercise to decrease her risk of diabetes. She denies diarrhea or nausea.  Lab Results  Component Value Date   HGBA1C 5.5 12/08/2018   Lab Results  Component Value Date   INSULIN 15.4 12/08/2018   INSULIN 18.3 05/05/2018   Other depression, with emotional eating (worsening) Cheryl Owens is in a period when she lacks motivation to stick to the plan. She is  eating unhealthy foods and she is skipping meals. She is currently working with a Social worker. Cheryl Owens is taking Lexapro and Bupropion. Her mood is somewhat depressed.  She shows no sign of suicidal or homicidal ideations.  Assessment/Plan:   Prediabetes  Mackynzie will continue to work on weight loss, exercise, and decreasing simple carbohydrates to help decrease the risk of diabetes. She agrees to continue metformin 500 mg two times daily with meals #60 with no refills.  Other depression, with emotional eating (worsening)  Adar will continue all medications and she will continue counseling. Orders and follow up as documented in patient record.   Obesity Kennadi is currently in the action stage of change. As such, her goal is to continue with weight loss efforts. She has agreed to the Category 3 Plan.   Behavioral modification strategies: increasing lean protein intake, decreasing simple carbohydrates, no skipping meals and planning for success. Cheryl Owens may have a cheat meal 1 time per week.  Cheryl Owens has agreed to follow-up with our clinic in 3 weeks. She was informed of the importance of frequent follow-up visits to maximize her success with intensive lifestyle modifications for her multiple health conditions.  Objective:   VITALS: Per patient if applicable, see vitals. GENERAL: Alert and in no acute distress. CARDIOPULMONARY: No increased WOB. Speaking in clear sentences.  PSYCH: Pleasant and cooperative. Speech normal rate and rhythm. Affect is appropriate. Insight and judgement are appropriate. Attention is focused, linear, and appropriate.  NEURO: Oriented as arrived to appointment on time with  no prompting.   Lab Results  Component Value Date   CREATININE 1.35 (H) 12/08/2018   BUN 12 12/08/2018   NA 143 12/08/2018   K 4.1 12/08/2018   CL 109 (H) 12/08/2018   CO2 20 12/08/2018   Lab Results  Component Value Date   ALT 23 12/08/2018   AST 18 12/08/2018   ALKPHOS 142 (H) 12/08/2018   BILITOT  0.4 12/08/2018   Lab Results  Component Value Date   HGBA1C 5.5 12/08/2018   HGBA1C 5.9 (H) 05/05/2018   HGBA1C 5.4 04/19/2015   HGBA1C 5.7 02/15/2013   HGBA1C 5.4 12/03/2010   Lab Results  Component Value Date   INSULIN 15.4 12/08/2018   INSULIN 18.3 05/05/2018   Lab Results  Component Value Date   TSH 2.31 09/03/2018   Lab Results  Component Value Date   CHOL 148 09/03/2018   HDL 45.50 09/03/2018   LDLCALC 87 09/03/2018   TRIG 74.0 09/03/2018   CHOLHDL 3 09/03/2018   Lab Results  Component Value Date   WBC 9.2 09/03/2018   HGB 12.5 09/03/2018   HCT 38.8 09/03/2018   MCV 86.3 09/03/2018   PLT 291.0 09/03/2018   No results found for: IRON, TIBC, FERRITIN   Ref. Range 12/08/2018 08:20  Vitamin D, 25-Hydroxy Latest Ref Range: 30.0 - 100.0 ng/mL 46.7    Attestation Statements:   Reviewed by clinician on day of visit: allergies, medications, problem list, medical history, surgical history, family history, social history, and previous encounter notes.  Corey Skains, am acting as Location manager for [employer], FNP-C.  I have reviewed the above documentation for accuracy and completeness, and I agree with the above. - Lacole Komorowski [employer], FNP-C

## 2019-03-29 ENCOUNTER — Encounter: Payer: Self-pay | Admitting: Family Medicine

## 2019-03-29 ENCOUNTER — Telehealth: Payer: Self-pay

## 2019-03-29 ENCOUNTER — Other Ambulatory Visit: Payer: Self-pay

## 2019-03-29 ENCOUNTER — Ambulatory Visit: Payer: BC Managed Care – PPO | Admitting: Family Medicine

## 2019-03-29 VITALS — BP 130/60 | HR 78 | Temp 97.3°F | Resp 18 | Ht 67.0 in | Wt 235.6 lb

## 2019-03-29 DIAGNOSIS — H6122 Impacted cerumen, left ear: Secondary | ICD-10-CM | POA: Insufficient documentation

## 2019-03-29 DIAGNOSIS — H60392 Other infective otitis externa, left ear: Secondary | ICD-10-CM | POA: Diagnosis not present

## 2019-03-29 DIAGNOSIS — H6090 Unspecified otitis externa, unspecified ear: Secondary | ICD-10-CM | POA: Insufficient documentation

## 2019-03-29 MED ORDER — OFLOXACIN 0.3 % OT SOLN: 10.0000 [drp] | Freq: Every day | OTIC | 0 refills | Status: DC

## 2019-03-29 NOTE — Assessment & Plan Note (Signed)
floxin otic gtts  F/u prn

## 2019-03-29 NOTE — Patient Instructions (Addendum)
COVID-19 Vaccine Information can be found at: [email or web address] For questions related to vaccine distribution or appointments, please email [email or web address] or call [phone].    Otitis Externa  Otitis externa is an infection of the outer ear canal. The outer ear canal is the area between the outside of the ear and the eardrum. Otitis externa is sometimes called swimmer's ear. What are the causes? Common causes of this condition include:  Swimming in dirty water.  Moisture in the ear.  An injury to the inside of the ear.  An object stuck in the ear.  A cut or scrape on the outside of the ear. What increases the risk? You are more likely to develop this condition if you go swimming often. What are the signs or symptoms? The first symptom of this condition is often itching in the ear. Later symptoms of the condition include:  Swelling of the ear.  Redness in the ear.  Ear pain. The pain may get worse when you pull on your ear.  Pus coming from the ear. How is this diagnosed? This condition may be diagnosed by examining the ear and testing fluid from the ear for bacteria and funguses. How is this treated? This condition may be treated with:  Antibiotic ear drops. These are often given for 10-14 days.  Medicines to reduce itching and swelling. Follow these instructions at home:  If you were prescribed antibiotic ear drops, use them as told by your health care provider. Do not stop using the antibiotic even if your condition improves.  Take over-the-counter and prescription medicines only as told by your health care provider.  Avoid getting water in your ears as told by your health care provider. This may include avoiding swimming or water sports for a few days.  Keep all follow-up visits as told by your health care provider. This is important. How is this prevented?  Keep your ears dry. Use the corner of  a towel to dry your ears after you swim or bathe.  Avoid scratching or putting things in your ear. Doing these things can damage the ear canal or remove the protective wax that lines it, which makes it easier for bacteria and funguses to grow.  Avoid swimming in lakes, polluted water, or pools that may not have enough chlorine. Contact a health care provider if:  You have a fever.  Your ear is still red, swollen, painful, or draining pus after 3 days.  Your redness, swelling, or pain gets worse.  You have a severe headache.  You have redness, swelling, pain, or tenderness in the area behind your ear. Summary  Otitis externa is an infection of the outer ear canal.  Common causes include swimming in dirty water, moisture in the ear, or a cut or scrape in the ear.  Symptoms include pain, redness, and swelling of the ear.  If you were prescribed antibiotic ear drops, use them as told by your health care provider. Do not stop using the antibiotic even if your condition improves. This information is not intended to replace advice given to you by your health care provider. Make sure you discuss any questions you have with your health care provider. Document Revised: 07/31/2017 Document Reviewed: 07/31/2017 Elsevier Patient Education  [place].

## 2019-03-29 NOTE — Progress Notes (Signed)
Patient ID: Cheryl Owens, female    DOB: 08/27/1965  Age: 54 y.o. MRN: [id number]    Subjective:  Subjective  HPI Cheryl Owens presents for L ear pain x few days   No fever.  No congestion   Review of Systems  Constitutional: Negative for appetite change, diaphoresis, fatigue and unexpected weight change.  HENT: Positive for ear pain. Negative for congestion, hearing loss, mouth sores, nosebleeds, postnasal drip, sneezing, sore throat and tinnitus.   Eyes: Negative for pain, redness and visual disturbance.  Respiratory: Negative for cough, chest tightness, shortness of breath and wheezing.   Cardiovascular: Negative for chest pain, palpitations and leg swelling.  Endocrine: Negative for cold intolerance, heat intolerance, polydipsia, polyphagia and polyuria.  Genitourinary: Negative for difficulty urinating, dysuria and frequency.  Neurological: Negative for dizziness, light-headedness, numbness and headaches.    History Past Medical History:  Diagnosis Date  . Anxiety   . Depression   . Dry mouth   . Fibroids   . Gallbladder disease   . High cholesterol   . HTN (hypertension) 2007   seen in ER  with headaches  . Hypertension   . Hypokalemia 2007   due to HCTZ  . Nervousness   . Obesity   . Stress   . Swelling of lower extremity   . Trouble in sleeping     She has a past surgical history that includes arm surgery; Cholecystectomy; Wisdom tooth extraction; and Breast surgery (Right, 2016).   Her family history includes Anxiety disorder in her mother; Bipolar disorder in her paternal aunt; Breast cancer (age of onset: 55) in her maternal aunt; Cancer in her father, maternal aunt, and maternal grandmother; Depression in her mother; Hyperlipidemia in her mother; Hypertension in her maternal aunt, maternal grandmother, mother, and paternal grandmother; Kidney disease in her maternal aunt; Obesity in her mother; Prostate cancer in her father and paternal uncle.She reports that she  has never smoked. She has never used smokeless tobacco. She reports that she does not drink alcohol or use drugs.  Current Outpatient Medications on File Prior to Visit  Medication Sig Dispense Refill  . atorvastatin (LIPITOR) 20 MG tablet TAKE 1 TABLET DAILY 90 tablet 3  . buPROPion (WELLBUTRIN XL) 300 MG 24 hr tablet TAKE 1 TABLET EVERY MORNING 90 tablet 3  . clonazePAM (KLONOPIN) 0.5 MG tablet TAKE ONE-HALF (1/2) TABLET IN THE MORNING AND 2 TABLETS AT BEDTIME (Patient taking differently: TAKE ONE-HALF (1/2) TABLET IN THE MORNING AND 1 TABLETS AT BEDTIME) 225 tablet 0  . escitalopram (LEXAPRO) 10 MG tablet TAKE 1 TABLET DAILY 90 tablet 3  . [name] 0.35 MG tablet TAKE 1 TABLET DAILY 84 tablet 3  . labetalol (NORMODYNE) 100 MG tablet Take 1 tablet (100 mg total) by mouth 2 (two) times daily. 180 tablet 3  . megestrol (MEGACE) 40 MG tablet Take 1 tablet (40 mg total) by mouth 2 (two) times daily. 60 tablet 1  . metFORMIN (GLUCOPHAGE) 500 MG tablet Take 1 tablet (500 mg total) by mouth 2 (two) times daily with a meal. 60 tablet 0  . spironolactone (ALDACTONE) 25 MG tablet TAKE 1 TABLET TWICE A DAY 180 tablet 3  . Vitamin D, Ergocalciferol, (DRISDOL) 1.25 MG (50000 UT) CAPS capsule Take 1 capsule (50,000 Units total) by mouth every 7 (seven) days. 12 capsule 0   No current facility-administered medications on file prior to visit.     Objective:  Objective  Physical Exam Vitals and nursing note reviewed.  Constitutional:      Appearance: She is well-developed.  HENT:     Head: Normocephalic and atraumatic.     Right Ear: Tympanic membrane, ear canal and external ear normal. There is no impacted cerumen.     Left Ear: Tympanic membrane normal. No decreased hearing noted. Tenderness present. There is impacted cerumen.     Ears:   Eyes:     Conjunctiva/sclera: Conjunctivae normal.  Neck:     Thyroid: No thyromegaly.     Vascular: No carotid bruit or JVD.  Cardiovascular:     Rate and  Rhythm: Normal rate and regular rhythm.     Heart sounds: Normal heart sounds. No murmur.  Pulmonary:     Effort: Pulmonary effort is normal. No respiratory distress.     Breath sounds: Normal breath sounds. No wheezing or rales.  Chest:     Chest wall: No tenderness.  Musculoskeletal:     Cervical back: Normal range of motion and neck supple.  Neurological:     Mental Status: She is alert and oriented to person, place, and time.    BP 130/60 (BP Location: Right Arm, Patient Position: Sitting, Cuff Size: Large)   Pulse 78   Temp (!) 97.3 F (36.3 C) (Temporal)   Resp 18   Ht 5\' 7"  (1.702 m)   Wt 235 lb 9.6 oz (106.9 kg)   SpO2 98%   BMI 36.90 kg/m  Wt Readings from Last 3 Encounters:  03/29/19 235 lb 9.6 oz (106.9 kg)  01/13/19 232 lb (105.2 kg)  12/08/18 231 lb (104.8 kg)     Lab Results  Component Value Date   WBC 9.2 09/03/2018   HGB 12.5 09/03/2018   HCT 38.8 09/03/2018   PLT 291.0 09/03/2018   GLUCOSE 86 12/08/2018   CHOL 148 09/03/2018   TRIG 74.0 09/03/2018   HDL 45.50 09/03/2018   LDLCALC 87 09/03/2018   ALT 23 12/08/2018   AST 18 12/08/2018   NA 143 12/08/2018   K 4.1 12/08/2018   CL 109 (H) 12/08/2018   CREATININE 1.35 (H) 12/08/2018   BUN 12 12/08/2018   CO2 20 12/08/2018   TSH 2.31 09/03/2018   HGBA1C 5.5 12/08/2018   MICROALBUR 3.1 (H) 05/05/2007    MM 3D SCREEN BREAST BILATERAL  Result Date: 09/17/2018 CLINICAL DATA:  Screening. EXAM: DIGITAL SCREENING BILATERAL MAMMOGRAM WITH TOMO AND CAD COMPARISON:  Previous exam(s). ACR Breast Density Category c: The breast tissue is heterogeneously dense, which may obscure small masses. FINDINGS: There are no findings suspicious for malignancy. Images were processed with CAD. IMPRESSION: No mammographic evidence of malignancy. A result letter of this screening mammogram will be mailed directly to the patient. RECOMMENDATION: Screening mammogram in one year. (Code:SM-B-01Y) BI-RADS CATEGORY  1: Negative.  Electronically Signed   By: Lovey Newcomer M.D.   On: 09/17/2018 07:44     Assessment & Plan:  Plan  I am having Vivianne Spence. Mun "[name]" start on ofloxacin. I am also having her maintain her labetalol, atorvastatin, escitalopram, spironolactone, buPROPion, [name], Vitamin D (Ergocalciferol), clonazePAM, megestrol, and metFORMIN.  Meds ordered this encounter  Medications  . ofloxacin (FLOXIN OTIC) 0.3 % OTIC solution    Sig: Place 10 drops into the left ear daily.    Dispense:  10 mL    Refill:  0    Problem List Items Addressed This Visit      Unprioritized   Impacted cerumen of left ear    floxin gtts L ear  Irrigated successfully      Otitis externa - Primary    floxin otic gtts  F/u prn       Relevant Medications   ofloxacin (FLOXIN OTIC) 0.3 % OTIC solution      Follow-up: Return if symptoms worsen or fail to improve.  Ann Held, DO

## 2019-03-29 NOTE — Assessment & Plan Note (Signed)
floxin gtts L ear  Irrigated successfully

## 2019-03-29 NOTE — Telephone Encounter (Signed)
[facility] called today to inform that Lupron 11.25mg  is on back order with expected arrival date of 05/08/19.  They asked if you would consider the 3.75mg  dose (so I assume they have it available)?

## 2019-04-01 ENCOUNTER — Telehealth: Payer: Self-pay

## 2019-04-01 NOTE — Telephone Encounter (Signed)
I called [facility] [phone] and spoke with pharmacist and gave Rx for Lupron 3.75 mg to be administered monthly x 5 refills (total 6 mos).  Pharmacist ran it through and covered with $25 copymt. The PA I did for the 11.25 mg worked for this and does not need to be repeated.

## 2019-04-01 NOTE — Telephone Encounter (Signed)
I called patient to let her know Lupron 11.25mg  on backorder until end of Feb. However, the 3.75 mg monthly dose it available and Dr. Dellis Filbert would like to proceed with that. I explained $25 copayment for each monthly injection and patient is okay with that. The pharmacist said they will be calling patient today to confirm so patient knows to expect that call from them. I will call patient the minute I receive the medication to arrange injection.

## 2019-04-01 NOTE — Telephone Encounter (Signed)
Yes, every month x 6.

## 2019-04-06 ENCOUNTER — Other Ambulatory Visit: Payer: Self-pay

## 2019-04-07 ENCOUNTER — Ambulatory Visit: Payer: BC Managed Care – PPO | Admitting: Obstetrics & Gynecology

## 2019-04-07 ENCOUNTER — Ambulatory Visit (INDEPENDENT_AMBULATORY_CARE_PROVIDER_SITE_OTHER): Payer: BC Managed Care – PPO

## 2019-04-07 ENCOUNTER — Encounter: Payer: Self-pay | Admitting: Obstetrics & Gynecology

## 2019-04-07 ENCOUNTER — Ambulatory Visit (INDEPENDENT_AMBULATORY_CARE_PROVIDER_SITE_OTHER): Payer: BC Managed Care – PPO | Admitting: Psychology

## 2019-04-07 ENCOUNTER — Other Ambulatory Visit: Payer: Self-pay | Admitting: Obstetrics & Gynecology

## 2019-04-07 VITALS — BP 126/80

## 2019-04-07 DIAGNOSIS — D251 Intramural leiomyoma of uterus: Secondary | ICD-10-CM

## 2019-04-07 DIAGNOSIS — N852 Hypertrophy of uterus: Secondary | ICD-10-CM

## 2019-04-07 DIAGNOSIS — N920 Excessive and frequent menstruation with regular cycle: Secondary | ICD-10-CM

## 2019-04-07 DIAGNOSIS — D219 Benign neoplasm of connective and other soft tissue, unspecified: Secondary | ICD-10-CM | POA: Diagnosis not present

## 2019-04-07 DIAGNOSIS — F339 Major depressive disorder, recurrent, unspecified: Secondary | ICD-10-CM

## 2019-04-07 LAB — CBC
HCT: 38.6 % (ref 35.0–45.0)
Hemoglobin: 12.6 g/dL (ref 11.7–15.5)
MCH: 27.5 pg (ref 27.0–33.0)
MCHC: 32.6 g/dL (ref 32.0–36.0)
MCV: 84.3 fL (ref 80.0–100.0)
MPV: 10.5 fL (ref 7.5–12.5)
Platelets: 331 10*3/uL (ref 140–400)
RBC: 4.58 10*6/uL (ref 3.80–5.10)
RDW: 12.2 % (ref 11.0–15.0)
WBC: 9.1 10*3/uL (ref 3.8–10.8)

## 2019-04-07 MED ORDER — LEUPROLIDE ACETATE 3.75 MG IM KIT
3.7500 mg | PACK | Freq: Once | INTRAMUSCULAR | Status: AC
  Administered 2019-04-07: 3.75 mg via INTRAMUSCULAR

## 2019-04-07 MED ORDER — LEUPROLIDE ACETATE (3 MONTH) 11.25 MG IM KIT: 11.2500 mg | PACK | Freq: Once | INTRAMUSCULAR | Status: DC

## 2019-04-07 NOTE — Patient Instructions (Signed)
1. Fibroids Large uterine fibroids unchanged by leuprolide (LUPRON) injection 3.75 mg x 2 doses, last dose 03/2018.  Worsening symptoms in spite of the progestin only birth control pill since November 2020 with menometrorrhagia and abdominal pelvic pressure, fullness and painful cramping when bleeding.  Different management options reviewed with patient.  Patient desires total hysterectomy.  Given the large size of the uterus with fibroids, the decision is made to proceed with TAH/Bilateral Salpingectomy.  Although patient is 51, she is still not menopausal.  The benefits versus risks of preserving her ovaries was discussed and the decision made to preserve her ovaries.  Surgery and postop expectations reviewed with patient today.  Patient will follow-up for preop visit to further discuss the preop preparation, the surgery with risks and benefits, as well as the postop precautions.  2. Menorrhagia with regular cycle Rule out anemia with a CBC today.  Continue on Megace to control heavy bleeding until surgery. - CBC  Cheryl Owens, it was a pleasure seeing you today!

## 2019-04-07 NOTE — Progress Notes (Signed)
    CORDIE SPEAKMAN 1965/07/24 [id number]        54 y.o.  [id number]   RP: Menorrhagia with Fibroids for Pelvic US  HPI: Had two doses of 3 months Depo-Lupron, the last one January 2020.  Took the progestin only birth control pills after that.  Bled for the first time in November 2020.  Had heavy bleeding at that time, a normal.  In December and heavy bleeding again in January.  Started on Megace in January to stop the bleeding and is currently on it without vaginal bleeding today.  Pelvic pressure and fullness all the time, with painful cramping when bleeding.   OB History  Gravida Para Term Preterm AB Living  4 1     3 1   SAB TAB Ectopic Multiple Live Births               # Outcome Date GA Lbr Len/2nd Weight Sex Delivery Anes PTL Lv  4 AB           3 AB           2 AB           1 Para             Past medical history,surgical history, problem list, medications, allergies, family history and social history were all reviewed and documented in the EPIC chart.   Directed ROS with pertinent positives and negatives documented in the history of present illness/assessment and plan.  Exam:  Vitals:   04/07/19 1444  BP: 126/80   General appearance:  Normal  Pelvic US today: T/V and T/a images.  Grossly enlarged uterus with multiple fibroids.  The overall uterine size is measured at 14.79 x 14.04 x 9.03 cm.  15 fibroids were measured but several smaller ones are also visualized.  The largest two fibroids measure 6 x 6 cm pedunculated to the right side and 5 x 4 cm pedunculated to the left anterior side.  The endometrial cavity is greatly distorted by fibroids and only partially visualized with no obvious thickening or masses.  The left ovary is small with atrophic appearance.  An adjacent serpiginous cystic structure measuring 7.8 x 2.5 cm probably corresponds to a hydrosalpinx.  The right ovary is not visualized.  No right adnexal mass is seen transabdominally or transvaginally.  No free fluid  in the posterior cul-de-sac.   Assessment/Plan:  54 y.o. [id number]   1. Fibroids Large uterine fibroids unchanged by leuprolide (LUPRON) injection 3.75 mg x 2 doses, last dose 03/2018.  Worsening symptoms in spite of the progestin only birth control pill since November 2020 with menometrorrhagia and abdominal pelvic pressure, fullness and painful cramping when bleeding.  Different management options reviewed with patient.  Patient desires total hysterectomy.  Given the large size of the uterus with fibroids, the decision is made to proceed with TAH/Bilateral Salpingectomy.  Although patient is 31, she is still not menopausal.  The benefits versus risks of preserving her ovaries was discussed and the decision made to preserve her ovaries.  Surgery and postop expectations reviewed with patient today.  Patient will follow-up for preop visit to further discuss the preop preparation, the surgery with risks and benefits, as well as the postop precautions.  2. Menorrhagia with regular cycle Rule out anemia with a CBC today.  Continue on Megace to control heavy bleeding until surgery. - CBC  Princess Bruins MD, 3:10 PM 04/07/2019

## 2019-04-11 ENCOUNTER — Telehealth: Payer: Self-pay

## 2019-04-11 ENCOUNTER — Encounter: Payer: Self-pay | Admitting: *Deleted

## 2019-04-11 NOTE — Telephone Encounter (Signed)
I called patient to discuss scheduling surgery. We discussed her ins benefits and her estimated surgery prepayment due by one week before surgery.  We discussed need for Covid screen prior to surgery and to quarantine from time of test until surgery to minimize risk of contracting Covid prior to surgery. We discussed dates available in March. She wants to consider and will call me back in the next two days.

## 2019-04-13 ENCOUNTER — Encounter (INDEPENDENT_AMBULATORY_CARE_PROVIDER_SITE_OTHER): Payer: Self-pay | Admitting: Family Medicine

## 2019-04-13 ENCOUNTER — Other Ambulatory Visit: Payer: Self-pay

## 2019-04-13 ENCOUNTER — Telehealth (INDEPENDENT_AMBULATORY_CARE_PROVIDER_SITE_OTHER): Payer: BC Managed Care – PPO | Admitting: Family Medicine

## 2019-04-13 DIAGNOSIS — R7303 Prediabetes: Secondary | ICD-10-CM | POA: Diagnosis not present

## 2019-04-13 DIAGNOSIS — Z6836 Body mass index (BMI) 36.0-36.9, adult: Secondary | ICD-10-CM

## 2019-04-13 DIAGNOSIS — E559 Vitamin D deficiency, unspecified: Secondary | ICD-10-CM

## 2019-04-13 MED ORDER — VITAMIN D (ERGOCALCIFEROL) 1.25 MG (50000 UNIT) PO CAPS: 50000.0000 [IU] | ORAL_CAPSULE | ORAL | 0 refills | Status: DC

## 2019-04-13 NOTE — Telephone Encounter (Signed)
Patient called back and I scheduled her Covid screening test accordingly and advised her regarding quarantine protocol.  We scheduled her a pre op televisit with Dr. Marguerita Merles and I will make sure that is okay with Dr. Marguerita Merles.

## 2019-04-13 NOTE — Telephone Encounter (Signed)
Patient wanted you to know that she does want her ovaries removed at time of TAH.  I went ahead and scheduled TAH, BSO in light of this.

## 2019-04-13 NOTE — Telephone Encounter (Signed)
Patient called back in voice mail and has decided on March 8 at 7:30am. I scheduled her and called her to call me back to schedule Covid screen.

## 2019-04-13 NOTE — Progress Notes (Signed)
TeleHealth Visit:  Due to the COVID-19 pandemic, this visit was completed with telemedicine (audio/video) technology to reduce patient and provider exposure as well as to preserve personal protective equipment.   Cheryl Owens has verbally consented to this TeleHealth visit. The patient is located at home, the provider is located at the [facility] office. The participants in this visit include the listed provider and patient and any and all parties involved. The visit was conducted today via WebEx.  Chief Complaint: OBESITY Cheryl Owens is here to discuss her progress with her obesity treatment plan along with follow-up of her obesity related diagnoses. Cheryl Owens is on the Category 3 Plan and states she is following her eating plan approximately 50% of the time. Cheryl Owens states she is exercising 0 minutes 0 times per week.  Today's visit was #: 21 Starting weight: 252 lbs Starting date: 05/05/2018  Interim History: Cheryl Owens reports she has maintained her weight and her weight today is 231 pounds (04/13/19). She has reduced her trips to [place].  She will be having a hysterectomy March 8th. She  is taking Megace for menorrhagia but does not report excessive polyphagia. She is skipping fewer meals due to an app she downloaded that reminds her to eat. Cheryl Owens has cut out sugary beverages.    Subjective:   Vitamin D deficiency  Cheryl Owens's last Vitamin D level was 46.7 on 12/08/18 and is not at goal. She is on prescription vit D.   Prediabetes . She continues to work on diet and exercise to decrease her risk of diabetes. She denies polyphagia.  Lab Results  Component Value Date   HGBA1C 5.5 12/08/2018   Lab Results  Component Value Date   INSULIN 15.4 12/08/2018   INSULIN 18.3 05/05/2018    Assessment/Plan:   Vitamin D deficiency Low Vitamin D level contributes to fatigue and are associated with obesity, breast, and colon cancer. Cheryl Owens agrees to continue to take prescription Vitamin D  @50 ,000 IU every week #4 with no refills and she will follow-up for routine testing of Vitamin D, at least 2-3 times per year to avoid over-replacement.  Prediabetes Cheryl Owens will continue with the meal plan and she will continue to work on weight loss, exercise, and decreasing simple carbohydrates to help decrease the risk of diabetes.   Class 2 severe obesity with serious comorbidity and body mass index (BMI) of 36.0 to 36.9 in adult, unspecified obesity type [facility]) Cheryl Owens is currently in the action stage of change. As such, her goal is to continue with weight loss efforts. She has agreed to the Category 3 Plan and keeping a food journal and adhering to recommended goals of 450 to 600 calories and 40 grams of protein at supper daily.   Exercise goals: No exercise has been prescribed at this time.  Behavioral modification strategies: increasing lean protein intake, increasing water intake and planning for success.  Cheryl Owens has agreed to follow-up with our clinic in 3 weeks. She was informed of the importance of frequent follow-up visits to maximize her success with intensive lifestyle modifications for her multiple health conditions.  Objective:   VITALS: Per patient if applicable, see vitals. GENERAL: Alert and in no acute distress. CARDIOPULMONARY: No increased WOB. Speaking in clear sentences.  PSYCH: Pleasant and cooperative. Speech normal rate and rhythm. Affect is appropriate. Insight and judgement are appropriate. Attention is focused, linear, and appropriate.  NEURO: Oriented as arrived to appointment on time with no prompting.   Lab Results  Component  Value Date   CREATININE 1.35 (H) 12/08/2018   BUN 12 12/08/2018   NA 143 12/08/2018   K 4.1 12/08/2018   CL 109 (H) 12/08/2018   CO2 20 12/08/2018   Lab Results  Component Value Date   ALT 23 12/08/2018   AST 18 12/08/2018   ALKPHOS 142 (H) 12/08/2018   BILITOT 0.4 12/08/2018   Lab Results  Component Value Date   HGBA1C 5.5  12/08/2018   HGBA1C 5.9 (H) 05/05/2018   HGBA1C 5.4 04/19/2015   HGBA1C 5.7 02/15/2013   HGBA1C 5.4 12/03/2010   Lab Results  Component Value Date   INSULIN 15.4 12/08/2018   INSULIN 18.3 05/05/2018   Lab Results  Component Value Date   TSH 2.31 09/03/2018   Lab Results  Component Value Date   CHOL 148 09/03/2018   HDL 45.50 09/03/2018   LDLCALC 87 09/03/2018   TRIG 74.0 09/03/2018   CHOLHDL 3 09/03/2018   Lab Results  Component Value Date   WBC 9.1 04/07/2019   HGB 12.6 04/07/2019   HCT 38.6 04/07/2019   MCV 84.3 04/07/2019   PLT 331 04/07/2019   No results found for: IRON, TIBC, FERRITIN   Ref. Range 12/08/2018 08:20  Vitamin D, 25-Hydroxy Latest Ref Range: 30.0 - 100.0 ng/mL 46.7   Attestation Statements:   Reviewed by clinician on day of visit: allergies, medications, problem list, medical history, surgical history, family history, social history, and previous encounter notes.  Corey Skains, am acting as Location manager for [employer], FNP-C.  I have reviewed the above documentation for accuracy and completeness, and I agree with the above. - Granite Godman [employer], FNP-C

## 2019-04-14 NOTE — Telephone Encounter (Signed)
Dr. Lavoie patient.  °

## 2019-04-15 NOTE — Telephone Encounter (Signed)
If she has no reason that she cannot take ibuprofen she could try that, 600 mg 4 times a day.  Also try a heating pad and/or ice over the uncomfortable area.

## 2019-04-15 NOTE — Telephone Encounter (Signed)
Dr.Lavoie patient asked if you could recommend another medication to help with fibroid pain?

## 2019-04-19 ENCOUNTER — Encounter (INDEPENDENT_AMBULATORY_CARE_PROVIDER_SITE_OTHER): Payer: Self-pay

## 2019-04-19 NOTE — Telephone Encounter (Signed)
Please see message. °

## 2019-04-20 ENCOUNTER — Encounter (INDEPENDENT_AMBULATORY_CARE_PROVIDER_SITE_OTHER): Payer: Self-pay | Admitting: Family Medicine

## 2019-04-21 NOTE — Telephone Encounter (Signed)
Please advise 

## 2019-04-26 ENCOUNTER — Ambulatory Visit: Payer: BC Managed Care – PPO | Admitting: Psychology

## 2019-04-27 ENCOUNTER — Encounter: Payer: Self-pay | Admitting: Gynecology

## 2019-04-27 NOTE — Telephone Encounter (Signed)
Agree with a Preop Televisit.

## 2019-05-02 ENCOUNTER — Encounter: Payer: Self-pay | Admitting: Family Medicine

## 2019-05-03 ENCOUNTER — Other Ambulatory Visit: Payer: Self-pay | Admitting: Family Medicine

## 2019-05-03 DIAGNOSIS — E559 Vitamin D deficiency, unspecified: Secondary | ICD-10-CM

## 2019-05-03 MED ORDER — VITAMIN D (ERGOCALCIFEROL) 1.25 MG (50000 UNIT) PO CAPS: 50000.0000 [IU] | ORAL_CAPSULE | ORAL | 1 refills | Status: DC

## 2019-05-04 ENCOUNTER — Telehealth (INDEPENDENT_AMBULATORY_CARE_PROVIDER_SITE_OTHER): Payer: BC Managed Care – PPO | Admitting: Family Medicine

## 2019-05-05 NOTE — Patient Instructions (Addendum)
Get your Covid test 05/12/19 at [place]. Drive thru at [id number]   Cheryl Owens       Your procedure is scheduled on  05/16/19   Report to [place]  at   5:30 A.M.   Call this number if you have problems the morning of surgery:[phone]    OUR ADDRESS IS [address], WE ARE LOCATED IN THE [facility]ICAL [facility] WITH [facility].   Remember:  Do not eat food or drink liquids after midnight.    Take these medicines the morning of surgery with A SIP OF WATER: Klonopin,Wellbutrin, Lexapro, Megace, Labetalol.      Do not wear jewelry, make-up or nail polish.  Do not wear lotions, powders, or perfumes, or deoderant.  Do not shave 48 hours prior to surgery.    Do not bring valuables to the hospital.  [facility] is not responsible for any belongings or valuables.  Contacts, dentures or bridgework may not be worn into surgery.    For patients admitted to the hospital, discharge time will be determined by your treatment team.  Patients discharged the day of surgery will not be allowed to drive home.   Special instructions:  Bring medications in original bottles  Please read over the following fact sheets that you were given:       [facility] - Preparing for Surgery  Before surgery, you can play an important role.   Because skin is not sterile, your skin needs to be as free of germs as possible.   You can reduce the number of germs on your skin by washing with CHG (chlorahexidine gluconate) soap before surgery.   CHG is an antiseptic cleaner which kills germs and bonds with the skin to continue killing germs even after washing. Please DO NOT use if you have an allergy to CHG or antibacterial soaps.   If your skin becomes reddened/irritated stop using the CHG and inform your nurse when you arrive at Short Stay. Do not shave (including legs and underarms) for at least 48 hours prior to the first CHG shower.    Please follow these  instructions carefully:  1.  Shower with CHG Soap the night before surgery and the  morning of Surgery.  2.  If you choose to wash your hair, wash your hair first as usual with your  normal  shampoo.  3.  After you shampoo, rinse your hair and body thoroughly to remove the  shampoo.                                        4.  Use CHG as you would any other liquid soap.  You can apply chg directly  to the skin and wash                       Gently with a scrungie or clean washcloth.  5.  Apply the CHG Soap to your body ONLY FROM THE NECK DOWN.   Do not use on face/ open                           Wound or open sores. Avoid contact with eyes, ears mouth and genitals (private parts).  Wash face,  Genitals (private parts) with your normal soap.             6.  Wash thoroughly, paying special attention to the area where your surgery  will be performed.  7.  Thoroughly rinse your body with warm water from the neck down.  8.  DO NOT shower/wash with your normal soap after using and rinsing off  the CHG Soap.             9.  Pat yourself dry with a clean towel.            10.  Wear clean pajamas.            11.  Place clean sheets on your bed the night of your first shower and do not  sleep with pets. Day of Surgery : Do not apply any lotions/deodorants the morning of surgery.  Please wear clean clothes to the hospital/surgery center.  FAILURE TO FOLLOW THESE INSTRUCTIONS MAY RESULT IN THE CANCELLATION OF YOUR SURGERY PATIENT SIGNATURE_________________________________  NURSE SIGNATURE__________________________________  ________________________________________________________________________   Adam Phenix  An incentive spirometer is a tool that can help keep your lungs clear and active. This tool measures how well you are filling your lungs with each breath. Taking long deep breaths may help reverse or decrease the chance of developing breathing (pulmonary) problems  (especially infection) following:  A long period of time when you are unable to move or be active. BEFORE THE PROCEDURE   If the spirometer includes an indicator to show your best effort, your nurse or respiratory therapist will set it to a desired goal.  If possible, sit up straight or lean slightly forward. Try not to slouch.  Hold the incentive spirometer in an upright position. INSTRUCTIONS FOR USE  1. Sit on the edge of your bed if possible, or sit up as far as you can in bed or on a chair. 2. Hold the incentive spirometer in an upright position. 3. Breathe out normally. 4. Place the mouthpiece in your mouth and seal your lips tightly around it. 5. Breathe in slowly and as deeply as possible, raising the piston or the ball toward the top of the column. 6. Hold your breath for 3-5 seconds or for as long as possible. Allow the piston or ball to fall to the bottom of the column. 7. Remove the mouthpiece from your mouth and breathe out normally. 8. Rest for a few seconds and repeat Steps 1 through 7 at least 10 times every 1-2 hours when you are awake. Take your time and take a few normal breaths between deep breaths. 9. The spirometer may include an indicator to show your best effort. Use the indicator as a goal to work toward during each repetition. 10. After each set of 10 deep breaths, practice coughing to be sure your lungs are clear. If you have an incision (the cut made at the time of surgery), support your incision when coughing by placing a pillow or rolled up towels firmly against it. Once you are able to get out of bed, walk around indoors and cough well. You may stop using the incentive spirometer when instructed by your caregiver.  RISKS AND COMPLICATIONS  Take your time so you do not get dizzy or light-headed.  If you are in pain, you may need to take or ask for pain medication before doing incentive spirometry. It is harder to take a deep breath if you are having  pain. AFTER USE  Rest and breathe slowly and easily.  It can be helpful to keep track of a log of your progress. Your caregiver can provide you with a simple table to help with this. If you are using the spirometer at home, follow these instructions: [place] IF:   You are having difficultly using the spirometer.  You have trouble using the spirometer as often as instructed.  Your pain medication is not giving enough relief while using the spirometer.  You develop fever of 100.5 F (38.1 C) or higher. SEEK IMMEDIATE MEDICAL CARE IF:   You cough up bloody sputum that had not been present before.  You develop fever of 102 F (38.9 C) or greater.  You develop worsening pain at or near the incision site. MAKE SURE YOU:   Understand these instructions.  Will watch your condition.  Will get help right away if you are not doing well or get worse. Document Released: 07/07/2006 Document Revised: 05/19/2011 Document Reviewed: 09/07/2006 [facility] Patient Information 2014 [place], [place].   ________________________________________________________________________

## 2019-05-06 ENCOUNTER — Encounter (HOSPITAL_COMMUNITY): Payer: Self-pay

## 2019-05-06 ENCOUNTER — Encounter (HOSPITAL_COMMUNITY)
Admission: RE | Admit: 2019-05-06 | Discharge: 2019-05-06 | Disposition: A | Discharge status: Home / Self Care | Payer: BC Managed Care – PPO | Source: Ambulatory Visit | Attending: Obstetrics & Gynecology | Admitting: Obstetrics & Gynecology

## 2019-05-06 ENCOUNTER — Other Ambulatory Visit: Payer: Self-pay

## 2019-05-06 HISTORY — DX: Prediabetes: R73.03

## 2019-05-06 NOTE — Progress Notes (Signed)
PCP - Y. [place] DO Cardiologist - none  Chest x-ray - no EKG - 05/10/19 Stress Test - no ECHO -no  Cardiac Cath - no  Sleep Study - no CPAP -   Fasting Blood Sugar - pre diabetic . Doesn't test Checks Blood Sugar _____ times a day  Blood Thinner Instructions:NA Aspirin Instructions: Last Dose:  Anesthesia review:   Patient denies shortness of breath, fever, cough and chest pain at PAT appointment yes  Patient verbalized understanding of instructions that were given to them at the PAT appointment. Patient was also instructed that they will need to review over the PAT instructions again at home before surgery. yes

## 2019-05-09 ENCOUNTER — Telehealth (INDEPENDENT_AMBULATORY_CARE_PROVIDER_SITE_OTHER): Payer: BC Managed Care – PPO | Admitting: Obstetrics & Gynecology

## 2019-05-09 ENCOUNTER — Telehealth: Payer: Self-pay

## 2019-05-09 ENCOUNTER — Other Ambulatory Visit: Payer: Self-pay

## 2019-05-09 DIAGNOSIS — D219 Benign neoplasm of connective and other soft tissue, unspecified: Secondary | ICD-10-CM | POA: Diagnosis not present

## 2019-05-09 DIAGNOSIS — N921 Excessive and frequent menstruation with irregular cycle: Secondary | ICD-10-CM

## 2019-05-09 MED ORDER — MEGESTROL ACETATE 40 MG PO TABS: 40.0000 mg | ORAL_TABLET | Freq: Two times a day (BID) | ORAL | 0 refills | Status: DC

## 2019-05-09 NOTE — Telephone Encounter (Signed)
Patient called to see if she needed to cancel her pre op Covid screen appt since surgery cancelled. I advised her I had already taken care of it.  Per Dr. Marguerita Merles staff message to me,( "Patient decided to continue on Megace instead of proceeding with surgery. Please send a prescription of Megace for her to have enough x 3 months. Schedule a Pelvic US with me in 3 months to reassess the Fibroids")  I discussed the Megace with her. She does not have a lot left. We decided to send the 3 mos supply to her mail order pharmacy.  Rx sent. She knows someone will be calling her back to schedule the u/s and I sent Butch Penny a staff message about that.

## 2019-05-09 NOTE — Progress Notes (Signed)
    Cheryl Owens 03-13-1965 [id number]        54 y.o.  [id number] Televisit by phone for preop counseling on TAH/BSO.  Verbal consent obtained and patient well identified.  Televisit done with patient in her home and myself in my [place] office.  Counseling for 25 minutes.  RP: Preop visit TAH/BSO for Fibroids/Menometrorrhagia  HPI: Very well on Megace.  Would like to continue on it and cancel the surgery.   OB History  Gravida Para Term Preterm AB Living  4 1     3 1   SAB TAB Ectopic Multiple Live Births               # Outcome Date GA Lbr Len/2nd Weight Sex Delivery Anes PTL Lv  4 AB           3 AB           2 AB           1 Para             Past medical history,surgical history, problem list, medications, allergies, family history and social history were all reviewed and documented in the EPIC chart.   Directed ROS with pertinent positives and negatives documented in the history of present illness/assessment and plan.  Exam:  There were no vitals filed for this visit. General appearance:  Normal  Televisit   Assessment/Plan:  54 y.o. G4P0031   1. Fibroids Surgery counseling for TAH/BSO for symptomatic large uterine fibroids.  The main symptom was menometrorrhagia which is now controlled on Megace.  Decision to cancel the TAH/BSO as patient is doing very well on Megace at this time.  She is 54 so likely to be reaching menopause soon.  Will reassess by pelvic ultrasound at 6 months, last ultrasound was January 2021.  2. Menometrorrhagia Controled on Megace.  We will continue on Megace until next ultrasound. - US Transvaginal Non-OB; Future   Princess Bruins MD, 12:37 PM 05/09/2019

## 2019-05-10 ENCOUNTER — Encounter (HOSPITAL_COMMUNITY): Payer: BC Managed Care – PPO

## 2019-05-10 ENCOUNTER — Encounter (HOSPITAL_COMMUNITY): Payer: Self-pay

## 2019-05-12 ENCOUNTER — Ambulatory Visit: Payer: BC Managed Care – PPO | Admitting: Psychology

## 2019-05-12 ENCOUNTER — Other Ambulatory Visit (HOSPITAL_COMMUNITY): Payer: BC Managed Care – PPO

## 2019-05-16 ENCOUNTER — Encounter (HOSPITAL_BASED_OUTPATIENT_CLINIC_OR_DEPARTMENT_OTHER): Admission: RE | Payer: Self-pay | Source: Home / Self Care

## 2019-05-16 ENCOUNTER — Ambulatory Visit (HOSPITAL_BASED_OUTPATIENT_CLINIC_OR_DEPARTMENT_OTHER)
Admission: RE | Admit: 2019-05-16 | Payer: BC Managed Care – PPO | Source: Home / Self Care | Admitting: Obstetrics & Gynecology

## 2019-05-16 SURGERY — HYSTERECTOMY, ABDOMINAL, WITH SALPINGO-OOPHORECTOMY
Anesthesia: General | Laterality: Bilateral

## 2019-05-17 ENCOUNTER — Encounter: Payer: Self-pay | Admitting: Obstetrics & Gynecology

## 2019-05-20 ENCOUNTER — Encounter: Payer: Self-pay | Admitting: Family Medicine

## 2019-05-23 NOTE — Telephone Encounter (Signed)
She can take xyzal, zyrtec, claritin  I would also get flonase otc

## 2019-08-03 ENCOUNTER — Other Ambulatory Visit: Payer: Self-pay | Admitting: Family Medicine

## 2019-08-09 ENCOUNTER — Other Ambulatory Visit: Payer: Self-pay | Admitting: Obstetrics & Gynecology

## 2019-08-09 DIAGNOSIS — Z1231 Encounter for screening mammogram for malignant neoplasm of breast: Secondary | ICD-10-CM

## 2019-08-10 ENCOUNTER — Other Ambulatory Visit: Payer: Self-pay

## 2019-08-11 ENCOUNTER — Ambulatory Visit: Payer: BC Managed Care – PPO | Admitting: Obstetrics & Gynecology

## 2019-08-11 ENCOUNTER — Other Ambulatory Visit: Payer: Self-pay | Admitting: Obstetrics & Gynecology

## 2019-08-11 ENCOUNTER — Ambulatory Visit (INDEPENDENT_AMBULATORY_CARE_PROVIDER_SITE_OTHER): Payer: BC Managed Care – PPO

## 2019-08-11 DIAGNOSIS — N921 Excessive and frequent menstruation with irregular cycle: Secondary | ICD-10-CM

## 2019-08-11 DIAGNOSIS — D219 Benign neoplasm of connective and other soft tissue, unspecified: Secondary | ICD-10-CM

## 2019-08-11 MED ORDER — MEDROXYPROGESTERONE ACETATE 5 MG PO TABS: 5.0000 mg | ORAL_TABLET | Freq: Every day | ORAL | 4 refills | Status: DC

## 2019-08-11 NOTE — Progress Notes (Signed)
    Cheryl Owens 06/09/1965 [id number]        54 y.o.  [id number]   RP: Fibroids with menometrorrhagia for f/u Pelvic US  HPI: Lighter menses on Megace x 05/2019.  No abdomino-pelvic pain.   OB History  Gravida Para Term Preterm AB Living  4 1     3 1   SAB TAB Ectopic Multiple Live Births               # Outcome Date GA Lbr Len/2nd Weight Sex Delivery Anes PTL Lv  4 AB           3 AB           2 AB           1 Para             Past medical history,surgical history, problem list, medications, allergies, family history and social history were all reviewed and documented in the EPIC chart.   Directed ROS with pertinent positives and negatives documented in the history of present illness/assessment and plan.  Exam:  There were no vitals filed for this visit. General appearance:  Normal  Pelvic US today: Comparison is made with previous scan January 2021.  Transabdominal images.  Grossly enlarged uterus with multiple large fibroids with no significant change in size of the uterus or fibroids since the previous scan. The uterus is measured at 15.18 x 13.98 x 8.86 cm.  3 fibroids are measured in the 5 cm range.  Small amount of fluid in the endometrial cavity outlining a very irregular cavity with several adjacent fibroids protruding into it.  Thin appearing walls.  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  Right ovary again not identified due to the pedunculated fibroids into the right adnexal area.  No free fluid in the posterior cul-de-sac.   Assessment/Plan:  54 y.o. G4P0031   1. Fibroids No abdominal pelvic pain and much reduced menometrorrhagia on Megace.  Pelvic ultrasound today was compared to the scan January 2021 and showed stability in the size of the uterus and fibroids.  The endometrial walls were thin.  No free fluid in the posterior cul-de-sac.  Patient is probably in perimenopause.  Decision  to stop Megace and try to regulate her menses with Provera 5 mg 1 tablet per mouth daily for 12 days cyclically every month.  No contraindication to Provera.  Usage reviewed as well as benefits and risks.  Prescription sent to pharmacy.  2. Menometrorrhagia Much improved on Megace.  Will now switch to cyclic Provera.  Pelvic ultrasound showing a thin endometrial lining.  Patient reassured.  Other orders - medroxyPROGESTERone (PROVERA) 5 MG tablet; Take 1 tablet (5 mg total) by mouth daily for 12 days. 1 tab PO daily from the 1st of the month to the [id number], cyclically every month.  Princess Bruins MD, 5:27 PM 08/11/2019

## 2019-08-12 ENCOUNTER — Encounter: Payer: Self-pay | Admitting: Obstetrics & Gynecology

## 2019-08-12 NOTE — Patient Instructions (Signed)
1. Fibroids No abdominal pelvic pain and much reduced menometrorrhagia on Megace.  Pelvic ultrasound today was compared to the scan January 2021 and showed stability in the size of the uterus and fibroids.  The endometrial walls were thin.  No free fluid in the posterior cul-de-sac.  Patient is probably in perimenopause.  Decision to stop Megace and try to regulate her menses with Provera 5 mg 1 tablet per mouth daily for 12 days cyclically every month.  No contraindication to Provera.  Usage reviewed as well as benefits and risks.  Prescription sent to pharmacy.  2. Menometrorrhagia Much improved on Megace.  Will now switch to cyclic Provera.  Pelvic ultrasound showing a thin endometrial lining.  Patient reassured.  Other orders - medroxyPROGESTERone (PROVERA) 5 MG tablet; Take 1 tablet (5 mg total) by mouth daily for 12 days. 1 tab PO daily from the 1st of the month to the 26OM, cyclically every month.  [name], it was a pleasure seeing you today!

## 2019-08-19 ENCOUNTER — Ambulatory Visit: Payer: BC Managed Care – PPO | Admitting: Family Medicine

## 2019-08-19 ENCOUNTER — Encounter: Payer: Self-pay | Admitting: Family Medicine

## 2019-08-19 ENCOUNTER — Other Ambulatory Visit: Payer: Self-pay

## 2019-08-19 VITALS — BP 122/72 | HR 90 | Temp 95.5°F | Ht 67.0 in | Wt 240.0 lb

## 2019-08-19 DIAGNOSIS — W57XXXA Bitten or stung by nonvenomous insect and other nonvenomous arthropods, initial encounter: Secondary | ICD-10-CM | POA: Diagnosis not present

## 2019-08-19 DIAGNOSIS — S80262A Insect bite (nonvenomous), left knee, initial encounter: Secondary | ICD-10-CM

## 2019-08-19 DIAGNOSIS — M79672 Pain in left foot: Secondary | ICD-10-CM

## 2019-08-19 MED ORDER — DOXYCYCLINE HYCLATE 100 MG PO TABS: 200.0000 mg | ORAL_TABLET | Freq: Once | ORAL | 0 refills | Status: AC

## 2019-08-19 MED ORDER — MELOXICAM 15 MG PO TABS: 15.0000 mg | ORAL_TABLET | Freq: Every day | ORAL | 0 refills | Status: DC

## 2019-08-19 NOTE — Patient Instructions (Addendum)
Ice/cold pack over area for 10-15 min twice daily.  Wear arch supports and heel cups for padding. Consider Dr. Felicie Morn and Power step for inserts.   Send me a message in 3-4 weeks if no improvement and we will set you up with the podiatry team.  Plantar Fasciitis Stretches/exercises Do exercises exactly as told by your health care provider and adjust them as directed. It is normal to feel mild stretching, pulling, tightness, or discomfort as you do these exercises, but you should stop right away if you feel sudden pain or your pain gets worse.   Stretching and range of motion exercises These exercises warm up your muscles and joints and improve the movement and flexibility of your foot. These exercises also help to relieve pain.  Exercise A: Plantar fascia stretch 1. Sit with your left / right leg crossed over your opposite knee. 2. Hold your heel with one hand with that thumb near your arch. With your other hand, hold your toes and gently pull them back toward the top of your foot. You should feel a stretch on the bottom of your toes or your foot or both. 3. Hold this stretch for 30 seconds. 4. Slowly release your toes and return to the starting position. Repeat 2 times. Complete this exercise 3 times per week.  Exercise B: Gastroc, standing 1. Stand with your hands against a wall. 2. Extend your left / right leg behind you, and bend your front knee slightly. 3. Keeping your heels on the floor and keeping your back knee straight, shift your weight toward the wall without arching your back. You should feel a gentle stretch in your left / right calf. 4. Hold this position for 30 seconds. Repeat 2 times. Complete this exercise 3 times a week. Exercise C: Soleus, standing 1. Stand with your hands against a wall. 2. Extend your left / right leg behind you, and bend your front knee slightly. 3. Keeping your heels on the floor, bend your back knee and slightly shift your weight over the back  leg. You should feel a gentle stretch deep in your calf. 4. Hold this position for 30 seconds. Repeat 2 times. Complete this exercise 3 times per week. Exercise D: Gastrocsoleus, standing 1. Stand with the ball of your left / right foot on a step. The ball of your foot is on the walking surface, right under your toes. 2. Keep your other foot firmly on the same step. 3. Hold onto the wall or a railing for balance. 4. Slowly lift your other foot, allowing your body weight to press your heel down over the edge of the step. You should feel a stretch in your left / right calf. 5. Hold this position for 30 seconds. 6. Return both feet to the step. 7. Repeat this exercise with a slight bend in your left / right knee. Repeat 2 times with your left / right knee straight and 2times with your left / right knee bent. Complete this exercise 3 times a week.  Balance exercise This exercise builds your balance and strength control of your arch to help take pressure off your plantar fascia. Exercise E: Single leg stand 1. Without shoes, stand near a railing or in a doorway. You may hold onto the railing or door frame as needed. 2. Stand on your left / right foot. Keep your big toe down on the floor and try to keep your arch lifted. Do not let your foot roll inward. 3. Hold this  position for 30 seconds. 4. If this exercise is too easy, you can try it with your eyes closed or while standing on a pillow. Repeat 2 times. Complete this exercise 3 times per week. This information is not intended to replace advice given to you by your health care provider. Make sure you discuss any questions you have with your health care provider. Document Released: 02/24/2005 Document Revised: 10/30/2015 Document Reviewed: 01/08/2015 Elsevier Interactive Patient Education  2017 [employer].

## 2019-08-19 NOTE — Progress Notes (Signed)
Chief Complaint  Patient presents with  . Insect Bite    tick    Cheryl Owens is a 54 y.o. female here for a skin complaint.  Duration: 10 days Location: L heel pain, bitten behind L knee Pruritic? Yes Painful? Yes Drainage? No New soaps/lotions/topicals/detergents? No Sick contacts? No Other associated symptoms: Bitten by tick; no fevers, jt pain otherwise Therapies tried thus far: Cortisone cream, no other pain meds   Past Medical History:  Diagnosis Date  . Anxiety   . Depression   . Dry mouth   . Fibroids   . Gallbladder disease   . High cholesterol   . HTN (hypertension) 2007   seen in ER  with headaches  . Hypertension   . Hypokalemia 2007   due to HCTZ  . Nervousness   . Obesity   . Pre-diabetes   . Stress   . Swelling of lower extremity   . Trouble in sleeping     BP 122/72 (BP Location: Left Arm, Patient Position: Sitting, Cuff Size: Large)   Pulse 90   Temp (!) 95.5 F (35.3 C) (Temporal)   Ht 5\' 7"  (1.702 m)   Wt 240 lb (108.9 kg)   SpO2 100%   BMI 37.59 kg/m  Gen: awake, alert, appearing stated age Lungs: No accessory muscle use Skin: Slightly raised and hyperpigmented circular lesion over L popliteal region w central excoriated area. No drainage, erythema, TTP, fluctuance. MSK: +TTP over plantar surf of calcaneus, not the common location of the prox insertion of the PF. There is no deformity, crepitus, erythema, excessive warmth or ecchymosis.  Psych: Age appropriate judgment and insight  Tick bite, initial encounter - Plan: doxycycline (VIBRA-TABS) 100 MG tablet  Pain of left heel - Plan: meloxicam (MOBIC) 15 MG tablet  1- No target lesion. Will give prophylactic dose of doxy. If anything changes, she will let us know.  2- Ice, heel cups, arch support. Stretches/exercises provided. If no improvement over next 3-4 weeks, will refer to podiatry.  F/u prn. The patient voiced understanding and agreement to the plan.  [place],  DO 08/19/19 11:54 AM

## 2019-08-21 ENCOUNTER — Other Ambulatory Visit: Payer: Self-pay | Admitting: Family Medicine

## 2019-08-21 DIAGNOSIS — I10 Essential (primary) hypertension: Secondary | ICD-10-CM

## 2019-08-22 ENCOUNTER — Encounter: Payer: Self-pay | Admitting: Family Medicine

## 2019-08-23 NOTE — Telephone Encounter (Signed)
Yes

## 2019-08-25 ENCOUNTER — Encounter: Payer: BC Managed Care – PPO | Admitting: Obstetrics & Gynecology

## 2019-08-29 NOTE — Telephone Encounter (Signed)
Maybe try taking the Provera 5 mg tablet daily for now and follow up if continued bleeding. May need a new rx.

## 2019-08-29 NOTE — Telephone Encounter (Signed)
Dr. Mariah Milling patient.  On 08/11/19 Dr. Marguerita Merles wrote "2. Menometrorrhagia Much improved on Megace.  Will now switch to cyclic Provera.  Pelvic ultrasound showing a thin endometrial lining.  Patient reassured."

## 2019-09-05 DIAGNOSIS — Z23 Encounter for immunization: Secondary | ICD-10-CM | POA: Diagnosis not present

## 2019-09-14 ENCOUNTER — Other Ambulatory Visit: Payer: Self-pay

## 2019-09-15 MED ORDER — MEDROXYPROGESTERONE ACETATE 2.5 MG PO TABS: ORAL_TABLET | ORAL | 0 refills | Status: DC

## 2019-09-19 ENCOUNTER — Ambulatory Visit: Payer: BC Managed Care – PPO

## 2019-09-29 ENCOUNTER — Ambulatory Visit
Admission: RE | Admit: 2019-09-29 | Discharge: 2019-09-29 | Disposition: A | Discharge status: Home / Self Care | Payer: BC Managed Care – PPO | Source: Ambulatory Visit | Attending: Obstetrics & Gynecology | Admitting: Obstetrics & Gynecology

## 2019-09-29 ENCOUNTER — Other Ambulatory Visit: Payer: Self-pay

## 2019-09-29 DIAGNOSIS — Z1231 Encounter for screening mammogram for malignant neoplasm of breast: Secondary | ICD-10-CM

## 2019-10-03 ENCOUNTER — Other Ambulatory Visit: Payer: Self-pay | Admitting: Obstetrics & Gynecology

## 2019-10-03 DIAGNOSIS — R928 Other abnormal and inconclusive findings on diagnostic imaging of breast: Secondary | ICD-10-CM

## 2019-10-11 ENCOUNTER — Other Ambulatory Visit: Payer: Self-pay

## 2019-10-11 ENCOUNTER — Ambulatory Visit: Payer: BC Managed Care – PPO | Admitting: Obstetrics & Gynecology

## 2019-10-11 ENCOUNTER — Encounter: Payer: Self-pay | Admitting: Obstetrics & Gynecology

## 2019-10-11 VITALS — BP 112/76 | Ht 66.0 in | Wt 244.0 lb

## 2019-10-11 DIAGNOSIS — N921 Excessive and frequent menstruation with irregular cycle: Secondary | ICD-10-CM | POA: Diagnosis not present

## 2019-10-11 DIAGNOSIS — R87610 Atypical squamous cells of undetermined significance on cytologic smear of cervix (ASC-US): Secondary | ICD-10-CM | POA: Diagnosis not present

## 2019-10-11 DIAGNOSIS — D219 Benign neoplasm of connective and other soft tissue, unspecified: Secondary | ICD-10-CM

## 2019-10-11 DIAGNOSIS — Z01419 Encounter for gynecological examination (general) (routine) without abnormal findings: Secondary | ICD-10-CM | POA: Diagnosis not present

## 2019-10-11 NOTE — Progress Notes (Signed)
Cheryl Owens [birth year] [id number]   History:    54 y.o.  G4P1A3L1 Single  RP:  Established patient presenting for annual gyn exam   HPI: DepoLupron 11/2017 and 03/2018.  Currently on Cyclic Provera.  Withdrawal bleeding heavy.  Feeling of fullness, discomfort.  Wants to proceed with Hysterectomy.  Abstinent.  Urine/BMs normal. Breasts normal.  BMI 39.38.  Health labs with Fam MD.   Past medical history,surgical history, family history and social history were all reviewed and documented in the EPIC chart.  Gynecologic History No LMP recorded. (Menstrual status: Other). Obstetric History  OB History  Gravida Para Term Preterm AB Living  4 1     3 1   SAB TAB Ectopic Multiple Live Births               # Outcome Date GA Lbr Len/2nd Weight Sex Delivery Anes PTL Lv  4 AB           3 AB           2 AB           1 Para              ROS: A ROS was performed and pertinent positives and negatives are included in the history.  GENERAL: No fevers or chills. HEENT: No change in vision, no earache, sore throat or sinus congestion. NECK: No pain or stiffness. CARDIOVASCULAR: No chest pain or pressure. No palpitations. PULMONARY: No shortness of breath, cough or wheeze. GASTROINTESTINAL: No abdominal pain, nausea, vomiting or diarrhea, melena or bright red blood per rectum. GENITOURINARY: No urinary frequency, urgency, hesitancy or dysuria. MUSCULOSKELETAL: No joint or muscle pain, no back pain, no recent trauma. DERMATOLOGIC: No rash, no itching, no lesions. ENDOCRINE: No polyuria, polydipsia, no heat or cold intolerance. No recent change in weight. HEMATOLOGICAL: No anemia or easy bruising or bleeding. NEUROLOGIC: No headache, seizures, numbness, tingling or weakness. PSYCHIATRIC: No depression, no loss of interest in normal activity or change in sleep pattern.     Exam:   BP 112/76   Ht 5\' 6"  (1.676 m)   Wt 244 lb (110.7 kg)   BMI 39.38 kg/m   Body mass index is 39.38  kg/m.  General appearance : Well developed well nourished female. No acute distress HEENT: Eyes: no retinal hemorrhage or exudates,  Neck supple, trachea midline, no carotid bruits, no thyroidmegaly Lungs: Clear to auscultation, no rhonchi or wheezes, or rib retractions  Heart: Regular rate and rhythm, no murmurs or gallops Breast:Examined in sitting and supine position were symmetrical in appearance, no palpable masses or tenderness,  no skin retraction, no nipple inversion, no nipple discharge, no skin discoloration, no axillary or supraclavicular lymphadenopathy Abdomen: no palpable masses or tenderness, no rebound or guarding Extremities: no edema or skin discoloration or tenderness  Pelvic: Vulva: Normal             Vagina: No gross lesions or discharge  Cervix: No gross lesions or discharge.  Pap reflex done.  Uterus  Enlarged, about 15 cm, nodular, mobile.  Adnexa  Without masses or tenderness  Anus: Normal  Pelvic US 08/11/2019:  Comparison is made with previous scan January 2021. Transabdominal images. Grossly enlarged uterus with multiple large fibroids with no significant change in size of the uterus or fibroids since the previous scan. The uterus is measured at 15.18 x 13.98 x 8.86 cm. 3 fibroids are measured in the 5 cm range. Small amount of fluid in the  endometrial cavity outlining a very irregular cavity with several adjacent fibroids protruding into it. Thin appearing walls. 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 Right ovary again not identified due to the pedunculated fibroids into the right adnexal area. No free fluid in the posterior cul-de-sac.   Assessment/Plan:  54 y.o. female for annual exam   1. Encounter for routine gynecological examination with Papanicolaou smear of cervix Stable Fibroid Uterus.  Pap reflex done.  Breasts normal.  Screening Mammo 09/2019 Lt negative.  Rt Dx  mammo/US scheduled.  [place] 2018.  Health labs with Fam MD. - Pap IG w/ reflex to HPV when ASC-U  2. ASCUS of cervix with negative high risk HPV Pap reflex done  3. Fibroids Multiple symptomatic Fibroids with discomfort and Menometrorrhagia.  Decision to proceed with an XI Robotic TLH/BSO.  Surgery/Risks/Benefits reviewed.  Patient understand that we may have to convert to an open surgery depending on the findings at the time of surgery.  Information and pamphlet given.  4. Menometrorrhagia As above.                        Patient was counseled as to the risk of surgery to include the following:  1. Infection (prohylactic antibiotics will be administered)  2. DVT/Pulmonary Embolism (prophylactic pneumo compression stockings will be used)  3.Trauma to internal organs requiring additional surgical procedure to repair any injury to internal organs requiring perhaps additional hospitalization days.  4.Hemmorhage requiring transfusion and blood products which carry risks such as anaphylactic reaction, hepatitis and AIDS  Patient had received literature information on the procedure scheduled and all her questions were answered and fully accepts all risk.   Princess Bruins MD, 12:07 PM 10/11/2019

## 2019-10-13 ENCOUNTER — Encounter: Payer: Self-pay | Admitting: *Deleted

## 2019-10-13 LAB — PAP IG W/ RFLX HPV ASCU

## 2019-10-14 ENCOUNTER — Encounter: Payer: Self-pay | Admitting: Obstetrics & Gynecology

## 2019-10-18 ENCOUNTER — Ambulatory Visit: Payer: BC Managed Care – PPO

## 2019-10-18 ENCOUNTER — Ambulatory Visit
Admission: RE | Admit: 2019-10-18 | Discharge: 2019-10-18 | Disposition: A | Discharge status: Home / Self Care | Payer: BC Managed Care – PPO | Source: Ambulatory Visit | Attending: Obstetrics & Gynecology | Admitting: Obstetrics & Gynecology

## 2019-10-18 ENCOUNTER — Other Ambulatory Visit: Payer: Self-pay

## 2019-10-18 ENCOUNTER — Telehealth: Payer: Self-pay

## 2019-10-18 DIAGNOSIS — R922 Inconclusive mammogram: Secondary | ICD-10-CM | POA: Diagnosis not present

## 2019-10-18 DIAGNOSIS — R928 Other abnormal and inconclusive findings on diagnostic imaging of breast: Secondary | ICD-10-CM

## 2019-10-18 NOTE — Telephone Encounter (Signed)
Patient called. Surgery scheduled 11/28/19 8:30am at [facility].  We discussed need for Covid testing pre op and appt was scheduled. I advised her of quarantine protocol after testing.  We discussed her ins benefits and her estimated GGA surgery prepymt due by one week prior to surgery. I will mail financial letter.  Pre op appt has already been done.

## 2019-10-18 NOTE — Telephone Encounter (Signed)
Left message voice mail to call me to discuss scheduling surgery.

## 2019-10-24 MED ORDER — MEGESTROL ACETATE 40 MG PO TABS: ORAL_TABLET | ORAL | 0 refills | Status: DC

## 2019-10-25 ENCOUNTER — Encounter: Payer: BC Managed Care – PPO | Admitting: Obstetrics & Gynecology

## 2019-10-25 NOTE — Telephone Encounter (Signed)
Dr. Marguerita Merles- Do you want her to still start the Megace now and stay on it BID until surgery in light of her bleeding has stopped?

## 2019-11-01 ENCOUNTER — Encounter: Payer: Self-pay | Admitting: Anesthesiology

## 2019-11-07 ENCOUNTER — Ambulatory Visit (INDEPENDENT_AMBULATORY_CARE_PROVIDER_SITE_OTHER): Payer: BC Managed Care – PPO | Admitting: Family Medicine

## 2019-11-07 ENCOUNTER — Encounter: Payer: Self-pay | Admitting: Family Medicine

## 2019-11-07 ENCOUNTER — Other Ambulatory Visit: Payer: Self-pay

## 2019-11-07 VITALS — BP 118/80 | HR 87 | Resp 18 | Ht 66.0 in | Wt 241.0 lb

## 2019-11-07 DIAGNOSIS — Z1159 Encounter for screening for other viral diseases: Secondary | ICD-10-CM

## 2019-11-07 DIAGNOSIS — Z0000 Encounter for general adult medical examination without abnormal findings: Secondary | ICD-10-CM

## 2019-11-07 DIAGNOSIS — Z0001 Encounter for general adult medical examination with abnormal findings: Secondary | ICD-10-CM | POA: Diagnosis not present

## 2019-11-07 DIAGNOSIS — I10 Essential (primary) hypertension: Secondary | ICD-10-CM

## 2019-11-07 DIAGNOSIS — M79644 Pain in right finger(s): Secondary | ICD-10-CM | POA: Diagnosis not present

## 2019-11-07 DIAGNOSIS — E559 Vitamin D deficiency, unspecified: Secondary | ICD-10-CM | POA: Diagnosis not present

## 2019-11-07 DIAGNOSIS — E785 Hyperlipidemia, unspecified: Secondary | ICD-10-CM

## 2019-11-07 MED ORDER — SPIRONOLACTONE 25 MG PO TABS: 25.0000 mg | ORAL_TABLET | Freq: Two times a day (BID) | ORAL | 3 refills | Status: DC

## 2019-11-07 MED ORDER — ATORVASTATIN CALCIUM 20 MG PO TABS: 20.0000 mg | ORAL_TABLET | Freq: Every day | ORAL | 3 refills | Status: DC

## 2019-11-07 MED ORDER — VITAMIN D (ERGOCALCIFEROL) 1.25 MG (50000 UNIT) PO CAPS: 50000.0000 [IU] | ORAL_CAPSULE | ORAL | 1 refills | Status: DC

## 2019-11-07 MED ORDER — LABETALOL HCL 100 MG PO TABS: 100.0000 mg | ORAL_TABLET | Freq: Two times a day (BID) | ORAL | 1 refills | Status: DC

## 2019-11-07 NOTE — Progress Notes (Addendum)
Subjective:     Cheryl Owens is a 53 y.o. female and is here for a comprehensive physical exam. The patient reports c/o pain in finger r hand and needs f/u chol and bp.     Social History   Socioeconomic History  . Marital status: Single    Spouse name: Not on file  . Number of children: 1  . Years of education: Not on file  . Highest education level: Not on file  Occupational History  . Occupation: Cabin crew  Tobacco Use  . Smoking status: Never Smoker  . Smokeless tobacco: Never Used  Vaping Use  . Vaping Use: Never used  Substance and Sexual Activity  . Alcohol use: No  . Drug use: No  . Sexual activity: Not Currently    Comment: 1st intercourse- 15, partners- 10  Other Topics Concern  . Not on file  Social History Narrative  . Not on file   Social Determinants of Health   Financial Resource Strain:   . Difficulty of Paying Living Expenses: Not on file  Food Insecurity:   . Worried About Charity fundraiser in the Last Year: Not on file  . Ran Out of Food in the Last Year: Not on file  Transportation Needs:   . Lack of Transportation (Medical): Not on file  . Lack of Transportation (Non-Medical): Not on file  Physical Activity:   . Days of Exercise per Week: Not on file  . Minutes of Exercise per Session: Not on file  Stress:   . Feeling of Stress : Not on file  Social Connections:   . Frequency of Communication with Friends and Family: Not on file  . Frequency of Social Gatherings with Friends and Family: Not on file  . Attends Religious Services: Not on file  . Active Member of Clubs or Organizations: Not on file  . Attends Archivist Meetings: Not on file  . Marital Status: Not on file  Intimate Partner Violence:   . Fear of Current or Ex-Partner: Not on file  . Emotionally Abused: Not on file  . Physically Abused: Not on file  . Sexually Abused: Not on file   Health Maintenance  Topic Date Due  . Hepatitis C Screening  Never done  .  TETANUS/TDAP  09/29/2018  . INFLUENZA VACCINE  10/09/2019  . HIV Screening  03/14/2027 (Originally 05/11/1980)  . MAMMOGRAM  09/28/2020  . PAP SMEAR-Modifier  08/19/2021  . COLONOSCOPY  04/28/2028  . COVID-19 Vaccine  Completed    The following portions of the patient's history were reviewed and updated as appropriate:  She  has a past medical history of Anxiety, Depression, Dry mouth, Fibroids, Gallbladder disease, High cholesterol, HTN (hypertension) (2007), Hypertension, Hypokalemia (2007), Nervousness, Obesity, Pre-diabetes, Stress, Swelling of lower extremity, and Trouble in sleeping. She does not have any pertinent problems on file. She  has a past surgical history that includes arm surgery; Cholecystectomy; Wisdom tooth extraction; and Breast surgery (Right, 2016). Her family history includes Anxiety disorder in her mother; Bipolar disorder in her paternal aunt; Breast cancer (age of onset: 65) in her maternal aunt; Cancer in her father, maternal aunt, and maternal grandmother; Depression in her mother; Hyperlipidemia in her mother; Hypertension in her maternal aunt, maternal grandmother, mother, and paternal grandmother; Kidney disease in her maternal aunt; Obesity in her mother; Prostate cancer in her father and paternal uncle. She  reports that she has never smoked. She has never used smokeless tobacco. She reports  that she does not drink alcohol and does not use drugs. She has a current medication list which includes the following prescription(s): atorvastatin, bupropion, clonazepam, escitalopram, labetalol, megestrol, spironolactone, and vitamin d (ergocalciferol). Current Outpatient Medications on File Prior to Visit  Medication Sig Dispense Refill  . atorvastatin (LIPITOR) 20 MG tablet TAKE 1 TABLET DAILY (Patient taking differently: Take 20 mg by mouth daily. ) 90 tablet 3  . buPROPion (WELLBUTRIN XL) 300 MG 24 hr tablet TAKE 1 TABLET EVERY MORNING (Patient taking differently: Take  300 mg by mouth daily. ) 90 tablet 3  . clonazePAM (KLONOPIN) 0.5 MG tablet TAKE ONE-HALF (1/2) TABLET IN THE MORNING AND 2 TABLETS AT BEDTIME (Patient taking differently: Take 0.25-0.5 mg by mouth See admin instructions. Take 0.25 mg by mouth in the morning and 0.5 mg at bedtime) 225 tablet 0  . escitalopram (LEXAPRO) 10 MG tablet TAKE 1 TABLET DAILY 90 tablet 0  . labetalol (NORMODYNE) 100 MG tablet TAKE 1 TABLET TWICE A DAY 180 tablet 0  . megestrol (MEGACE) 40 MG tablet Take one tablet po bid until surgery day. 65 tablet 0  . spironolactone (ALDACTONE) 25 MG tablet TAKE 1 TABLET TWICE A DAY (Patient taking differently: Take 25 mg by mouth daily. ) 180 tablet 3  . Vitamin D, Ergocalciferol, (DRISDOL) 1.25 MG (50000 UNIT) CAPS capsule Take 1 capsule (50,000 Units total) by mouth every 7 (seven) days. 12 capsule 1   No current facility-administered medications on file prior to visit.   She is allergic to hydrochlorothiazide and benazepril..  Review of Systems Review of Systems  Constitutional: Negative for activity change, appetite change and fatigue.  HENT: Negative for hearing loss, congestion, tinnitus and ear discharge.  dentist q14m Eyes: Negative for visual disturbance (see optho q1y -- vision corrected to 20/20 with glasses).  Respiratory: Negative for cough, chest tightness and shortness of breath.   Cardiovascular: Negative for chest pain, palpitations and leg swelling.  Gastrointestinal: Negative for abdominal pain, diarrhea, constipation and abdominal distention.  Genitourinary: Negative for urgency, frequency, decreased urine volume and difficulty urinating.  Musculoskeletal: Negative for back pain, arthralgias and gait problem.  Skin: Negative for color change, pallor and rash.  Neurological: Negative for dizziness, light-headedness, numbness and headaches.  Hematological: Negative for adenopathy. Does not bruise/bleed easily.  Psychiatric/Behavioral: Negative for suicidal  ideas, confusion, sleep disturbance, self-injury, dysphoric mood, decreased concentration and agitation.       Objective:    BP 118/80 (BP Location: Left Arm, Patient Position: Sitting, Cuff Size: Normal)   Pulse 87   Resp 18   Ht 5\' 6"  (1.676 m)   Wt 241 lb (109.3 kg)   SpO2 98%   BMI 38.90 kg/m  General appearance: alert, cooperative, appears stated age and no distress Head: Normocephalic, without obvious abnormality, atraumatic Eyes: negative findings: lids and lashes normal, conjunctivae and sclerae normal and pupils equal, round, reactive to light and accomodation Ears: normal TM's and external ear canals both ears Neck: no adenopathy, no carotid bruit, no JVD, supple, symmetrical, trachea midline and thyroid not enlarged, symmetric, no tenderness/mass/nodules Back: symmetric, no curvature. ROM normal. No CVA tenderness. Lungs: clear to auscultation bilaterally Breasts: gyn Heart: regular rate and rhythm, S1, S2 normal, no murmur, click, rub or gallop Abdomen: soft, non-tender; bowel sounds normal; no masses,  no organomegaly Pelvic: deferred --gyn Extremities: extremities normal, atraumatic, no cyanosis or edema Pulses: 2+ and symmetric Skin: Skin color, texture, turgor normal. No rashes or lesions Lymph nodes: Cervical, supraclavicular, and  axillary nodes normal. Neurologic: Alert and oriented X 3, normal strength and tone. Normal symmetric reflexes. Normal coordination and gait    Health Maintenance  Topic Date Due  . TETANUS/TDAP  09/29/2018  . INFLUENZA VACCINE  10/09/2019  . HIV Screening  03/14/2027 (Originally 05/11/1980)  . MAMMOGRAM  09/28/2020  . PAP SMEAR-Modifier  08/19/2021  . COLONOSCOPY  04/28/2028  . COVID-19 Vaccine  Completed  . Hepatitis C Screening  Completed    Assessment:    Healthy female exam.      Plan:    ghm utd Check labs  See After Visit Summary for Counseling Recommendations    1. Preventative health care See above  - Lipid  panel - CBC with Differential/Platelet - Comprehensive metabolic panel - TSH  2. Dyslipidemia Encouraged heart healthy diet, increase exercise, avoid trans fats, consider a krill oil cap daily - Lipid panel - CBC with Differential/Platelet - Comprehensive metabolic panel - TSH  3. Essential hypertension Well controlled, no changes to meds. Encouraged heart healthy diet such as the DASH diet and exercise as tolerated.  - Lipid panel - CBC with Differential/Platelet - Comprehensive metabolic panel - TSH - spironolactone (ALDACTONE) 25 MG tablet; Take 1 tablet (25 mg total) by mouth 2 (two) times daily.  Dispense: 180 tablet; Refill: 3 - labetalol (NORMODYNE) 100 MG tablet; Take 1 tablet (100 mg total) by mouth 2 (two) times daily.  Dispense: 180 tablet; Refill: 1  4. Vitamin D deficiency  - Vitamin D (25 hydroxy) - Vitamin D, Ergocalciferol, (DRISDOL) 1.25 MG (50000 UNIT) CAPS capsule; Take 1 capsule (50,000 Units total) by mouth every 7 (seven) days.  Dispense: 12 capsule; Refill: 1  5. Need for hepatitis C screening test  - Hepatitis C antibody  6. Pain in finger of right hand Check labs  Tylenol arthritis  - Rheumatoid Factor - Antinuclear Antib (ANA)  7. Hyperlipidemia LDL goal <100 Encouraged heart healthy diet, increase exercise, avoid trans fats, consider a krill oil cap daily - atorvastatin (LIPITOR) 20 MG tablet; Take 1 tablet (20 mg total) by mouth daily.  Dispense: 90 tablet; Refill: 3

## 2019-11-07 NOTE — Patient Instructions (Signed)

## 2019-11-08 ENCOUNTER — Encounter: Payer: BC Managed Care – PPO | Admitting: Family Medicine

## 2019-11-10 LAB — HEPATITIS C ANTIBODY
Hepatitis C Ab: NONREACTIVE
SIGNAL TO CUT-OFF: 0.04 (ref <1.00)

## 2019-11-10 LAB — COMPREHENSIVE METABOLIC PANEL
AG Ratio: 1.4 (calc) (ref 1.0–2.5)
ALT: 26 U/L (ref 6–29)
AST: 23 U/L (ref 10–35)
Albumin: 4.2 g/dL (ref 3.6–5.1)
Alkaline phosphatase (APISO): 113 U/L (ref 37–153)
BUN/Creatinine Ratio: 11 (calc) (ref 6–22)
BUN: 16 mg/dL (ref 7–25)
CO2: 21 mmol/L (ref 20–32)
Calcium: 9.7 mg/dL (ref 8.6–10.4)
Chloride: 106 mmol/L (ref 98–110)
Creat: 1.44 mg/dL — ABNORMAL HIGH (ref 0.50–1.05)
Globulin: 2.9 g/dL (calc) (ref 1.9–3.7)
Glucose, Bld: 98 mg/dL (ref 65–99)
Potassium: 4.4 mmol/L (ref 3.5–5.3)
Sodium: 138 mmol/L (ref 135–146)
Total Bilirubin: 0.5 mg/dL (ref 0.2–1.2)
Total Protein: 7.1 g/dL (ref 6.1–8.1)

## 2019-11-10 LAB — RHEUMATOID FACTOR: Rheumatoid fact SerPl-aCnc: <14 IU/mL (ref <14)

## 2019-11-10 LAB — ANTI-NUCLEAR AB-TITER (ANA TITER)
ANA TITER: 1:640 {titer} — ABNORMAL HIGH
ANA Titer 1: 1:40 {titer} — ABNORMAL HIGH

## 2019-11-10 LAB — CBC WITH DIFFERENTIAL/PLATELET
Absolute Monocytes: 569 cells/uL (ref 200–950)
Basophils Absolute: 62 cells/uL (ref 0–200)
Basophils Relative: 0.8 %
Eosinophils Absolute: 109 cells/uL (ref 15–500)
Eosinophils Relative: 1.4 %
HCT: 39 % (ref 35.0–45.0)
Hemoglobin: 12.7 g/dL (ref 11.7–15.5)
Lymphs Abs: 1903 cells/uL (ref 850–3900)
MCH: 28 pg (ref 27.0–33.0)
MCHC: 32.6 g/dL (ref 32.0–36.0)
MCV: 86.1 fL (ref 80.0–100.0)
MPV: 10.9 fL (ref 7.5–12.5)
Monocytes Relative: 7.3 %
Neutro Abs: 5156 cells/uL (ref 1500–7800)
Neutrophils Relative %: 66.1 %
Platelets: 281 10*3/uL (ref 140–400)
RBC: 4.53 10*6/uL (ref 3.80–5.10)
RDW: 13.1 % (ref 11.0–15.0)
Total Lymphocyte: 24.4 %
WBC: 7.8 10*3/uL (ref 3.8–10.8)

## 2019-11-10 LAB — LIPID PANEL
Cholesterol: 171 mg/dL (ref <200)
HDL: 55 mg/dL (ref >=50)
LDL Cholesterol (Calc): 99 mg/dL (calc)
Non-HDL Cholesterol (Calc): 116 mg/dL (calc) (ref <130)
Total CHOL/HDL Ratio: 3.1 (calc) (ref <5.0)
Triglycerides: 81 mg/dL (ref <150)

## 2019-11-10 LAB — ANA: Anti Nuclear Antibody (ANA): POSITIVE — AB

## 2019-11-10 LAB — TSH: TSH: 2.77 mIU/L

## 2019-11-10 LAB — VITAMIN D 25 HYDROXY (VIT D DEFICIENCY, FRACTURES): Vit D, 25-Hydroxy: 67 ng/mL (ref 30–100)

## 2019-11-13 ENCOUNTER — Encounter: Payer: Self-pay | Admitting: Family Medicine

## 2019-11-15 NOTE — Telephone Encounter (Signed)
DOD: Could you give the patient a further explanation of her results until Dr. Etter Sjogren returns? Please advse

## 2019-11-16 ENCOUNTER — Ambulatory Visit: Payer: BC Managed Care – PPO | Admitting: Family

## 2019-11-16 DIAGNOSIS — Z0289 Encounter for other administrative examinations: Secondary | ICD-10-CM

## 2019-11-17 ENCOUNTER — Ambulatory Visit: Payer: BC Managed Care – PPO | Admitting: Family Medicine

## 2019-11-17 ENCOUNTER — Encounter: Payer: Self-pay | Admitting: Family Medicine

## 2019-11-17 ENCOUNTER — Other Ambulatory Visit: Payer: Self-pay

## 2019-11-17 VITALS — BP 120/68 | HR 74 | Temp 97.6°F | Ht 66.0 in | Wt 244.6 lb

## 2019-11-17 DIAGNOSIS — L03113 Cellulitis of right upper limb: Secondary | ICD-10-CM

## 2019-11-17 MED ORDER — DOXYCYCLINE HYCLATE 100 MG PO TABS: 100.0000 mg | ORAL_TABLET | Freq: Two times a day (BID) | ORAL | 0 refills | Status: DC

## 2019-11-17 MED ORDER — TRIAMCINOLONE ACETONIDE 0.1 % EX OINT: 1.0000 "application " | TOPICAL_OINTMENT | Freq: Two times a day (BID) | CUTANEOUS | 0 refills | Status: DC

## 2019-11-17 NOTE — Patient Instructions (Addendum)
Doxycycline tablets or capsules What is this medicine? DOXYCYCLINE (dox i SYE kleen) is a tetracycline antibiotic. It kills certain bacteria or stops their growth. It is used to treat many kinds of infections, like dental, skin, respiratory, and urinary tract infections. It also treats acne, Lyme disease, malaria, and certain sexually transmitted infections. This medicine may be used for other purposes; ask your health care provider or pharmacist if you have questions. COMMON BRAND NAME(S): Acticlate, Adoxa, Adoxa CK, Adoxa Pak, Adoxa TT, Alodox, Avidoxy, Doxal, LYMEPAK, Mondoxyne NL, Monodox, Morgidox 1x, Morgidox 1x Kit, Morgidox 2x, Morgidox 2x Kit, NutriDox, Ocudox, Hendricks, Joyce, Vibra-Tabs, Vibramycin What should I tell my health care provider before I take this medicine? They need to know if you have any of these conditions:  liver disease  long exposure to sunlight like working outdoors  stomach problems like colitis  an unusual or allergic reaction to doxycycline, tetracycline antibiotics, other medicines, foods, dyes, or preservatives  pregnant or trying to get pregnant  breast-feeding How should I use this medicine? Take this medicine by mouth with a full glass of water. Follow the directions on the prescription label. It is best to take this medicine without food, but if it upsets your stomach take it with food. Take your medicine at regular intervals. Do not take your medicine more often than directed. Take all of your medicine as directed even if you think you are better. Do not skip doses or stop your medicine early. Talk to your pediatrician regarding the use of this medicine in children. While this drug may be prescribed for selected conditions, precautions do apply. Overdosage: If you think you have taken too much of this medicine contact a poison control center or emergency room at once. NOTE: This medicine is only for you. Do not share this medicine with others. What if  I miss a dose? If you miss a dose, take it as soon as you can. If it is almost time for your next dose, take only that dose. Do not take double or extra doses. What may interact with this medicine?  antacids  barbiturates  birth control pills  bismuth subsalicylate  carbamazepine  methoxyflurane  other antibiotics  phenytoin  vitamins that contain iron  warfarin This list may not describe all possible interactions. Give your health care provider a list of all the medicines, herbs, non-prescription drugs, or dietary supplements you use. Also tell them if you smoke, drink alcohol, or use illegal drugs. Some items may interact with your medicine. What should I watch for while using this medicine? Tell your doctor or health care professional if your symptoms do not improve. Do not treat diarrhea with over the counter products. Contact your doctor if you have diarrhea that lasts more than 2 days or if it is severe and watery. Do not take this medicine just before going to bed. It may not dissolve properly when you lay down and can cause pain in your throat. Drink plenty of fluids while taking this medicine to also help reduce irritation in your throat. This medicine can make you more sensitive to the sun. Keep out of the sun. If you cannot avoid being in the sun, wear protective clothing and use sunscreen. Do not use sun lamps or tanning beds/booths. Birth control pills may not work properly while you are taking this medicine. Talk to your doctor about using an extra method of birth control. If you are being treated for a sexually transmitted infection, avoid sexual contact until  you have finished your treatment. Your sexual partner may also need treatment. Avoid antacids, aluminum, calcium, magnesium, and iron products for 4 hours before and 2 hours after taking a dose of this medicine. If you are using this medicine to prevent malaria, you should still protect yourself from contact with  mosquitos. Stay in screened-in areas, use mosquito nets, keep your body covered, and use an insect repellent. What side effects may I notice from receiving this medicine? Side effects that you should report to your doctor or health care professional as soon as possible:  allergic reactions like skin rash, itching or hives, swelling of the face, lips, or tongue  difficulty breathing  fever  itching in the rectal or genital area  pain on swallowing  rash, fever, and swollen lymph nodes  redness, blistering, peeling or loosening of the skin, including inside the mouth  severe stomach pain or cramps  unusual bleeding or bruising  unusually weak or tired  yellowing of the eyes or skin Side effects that usually do not require medical attention (report to your doctor or health care professional if they continue or are bothersome):  diarrhea  loss of appetite  nausea, vomiting This list may not describe all possible side effects. Call your doctor for medical advice about side effects. You may report side effects to FDA at 1-800-FDA-1088. Where should I keep my medicine? Keep out of the reach of children. Store at room temperature, below 30 degrees C (86 degrees F). Protect from light. Keep container tightly closed. Throw away any unused medicine after the expiration date. Taking this medicine after the expiration date can make you seriously ill. NOTE: This sheet is a summary. It may not cover all possible information. If you have questions about this medicine, talk to your doctor, pharmacist, or health care provider.  2020 Elsevier/Gold Standard (2018-05-27 13:44:53)  

## 2019-11-17 NOTE — Progress Notes (Signed)
Established Patient Office Visit  Subjective:  Patient ID: Cheryl Owens, female    DOB: 1965-07-06  Age: 54 y.o. MRN: [id number]  CC:  Chief Complaint  Patient presents with  . Insect Bite    on right arm-3 raised bites that itch and was swollen since Thursday    HPI [place] presents for evaluation and treatment of a 1 to 2-day history of a swelling on her posterior right brachium.  It itches.  It is not particularly sore.  It has not drained.  She had 3 unrelated pustule-like lesions a week ago that seem to have resolved on their own.  These lesions have not been painful.  There has been no fever or chills.  She has been using 1% hydrocortisone cream with some benefit.  She has a family history of lupus she tells me.  Past Medical History:  Diagnosis Date  . Anxiety   . Depression   . Dry mouth   . Fibroids   . Gallbladder disease   . High cholesterol   . HTN (hypertension) 2007   seen in ER  with headaches  . Hypertension   . Hypokalemia 2007   due to HCTZ  . Nervousness   . Obesity   . Pre-diabetes   . Stress   . Swelling of lower extremity   . Trouble in sleeping     Past Surgical History:  Procedure Laterality Date  . arm surgery     post trauma @ age 74  . BREAST SURGERY Right 2016   BREAST BX   . CHOLECYSTECTOMY     gall stones  . WISDOM TOOTH EXTRACTION      Family History  Problem Relation Age of Onset  . Cancer Maternal Grandmother        thyroid  . Hypertension Maternal Grandmother   . Hyperlipidemia Mother   . Hypertension Mother   . Depression Mother   . Anxiety disorder Mother   . Obesity Mother   . Breast cancer Maternal Aunt 51  . Kidney disease Maternal Aunt         X 2; 1 on Dialysis  . Cancer Maternal Aunt        ? primary; also had HTN  . Prostate cancer Paternal Uncle          3 uncles  . Prostate cancer Father   . Cancer Father   . Hypertension Paternal Grandmother   . Hypertension Maternal Aunt        X 2  . Bipolar  disorder Paternal Aunt   . Stroke Neg Hx   . Diabetes Neg Hx   . Heart disease Neg Hx   . Colon cancer Neg Hx     Social History   Socioeconomic History  . Marital status: Single    Spouse name: Not on file  . Number of children: 1  . Years of education: Not on file  . Highest education level: Not on file  Occupational History  . Occupation: Cabin crew  Tobacco Use  . Smoking status: Never Smoker  . Smokeless tobacco: Never Used  Vaping Use  . Vaping Use: Never used  Substance and Sexual Activity  . Alcohol use: No  . Drug use: No  . Sexual activity: Not Currently    Comment: 1st intercourse- 15, partners- 10  Other Topics Concern  . Not on file  Social History Narrative  . Not on file   Social Determinants of Health  Financial Resource Strain:   . Difficulty of Paying Living Expenses: Not on file  Food Insecurity:   . Worried About Charity fundraiser in the Last Year: Not on file  . Ran Out of Food in the Last Year: Not on file  Transportation Needs:   . Lack of Transportation (Medical): Not on file  . Lack of Transportation (Non-Medical): Not on file  Physical Activity:   . Days of Exercise per Week: Not on file  . Minutes of Exercise per Session: Not on file  Stress:   . Feeling of Stress : Not on file  Social Connections:   . Frequency of Communication with Friends and Family: Not on file  . Frequency of Social Gatherings with Friends and Family: Not on file  . Attends Religious Services: Not on file  . Active Member of Clubs or Organizations: Not on file  . Attends Archivist Meetings: Not on file  . Marital Status: Not on file  Intimate Partner Violence:   . Fear of Current or Ex-Partner: Not on file  . Emotionally Abused: Not on file  . Physically Abused: Not on file  . Sexually Abused: Not on file    Outpatient Medications Prior to Visit  Medication Sig Dispense Refill  . atorvastatin (LIPITOR) 20 MG tablet Take 1 tablet (20 mg  total) by mouth daily. 90 tablet 3  . buPROPion (WELLBUTRIN XL) 300 MG 24 hr tablet TAKE 1 TABLET EVERY MORNING (Patient taking differently: Take 300 mg by mouth daily. ) 90 tablet 3  . clonazePAM (KLONOPIN) 0.5 MG tablet TAKE ONE-HALF (1/2) TABLET IN THE MORNING AND 2 TABLETS AT BEDTIME (Patient taking differently: Take 0.25-0.5 mg by mouth See admin instructions. Take 0.25 mg by mouth in the morning and 0.5 mg at bedtime) 225 tablet 0  . escitalopram (LEXAPRO) 10 MG tablet TAKE 1 TABLET DAILY (Patient taking differently: Take 10 mg by mouth daily. ) 90 tablet 0  . labetalol (NORMODYNE) 100 MG tablet Take 1 tablet (100 mg total) by mouth 2 (two) times daily. 180 tablet 1  . megestrol (MEGACE) 40 MG tablet Take one tablet po bid until surgery day. (Patient taking differently: Take 40 mg by mouth 2 (two) times daily. ) 65 tablet 0  . spironolactone (ALDACTONE) 25 MG tablet Take 1 tablet (25 mg total) by mouth 2 (two) times daily. 180 tablet 3  . Vitamin D, Ergocalciferol, (DRISDOL) 1.25 MG (50000 UNIT) CAPS capsule Take 1 capsule (50,000 Units total) by mouth every 7 (seven) days. 12 capsule 1   No facility-administered medications prior to visit.    Allergies  Allergen Reactions  . Hydrochlorothiazide     REACTION: hypokalemia  . Benazepril Nausea And Vomiting    abd cramps    ROS Review of Systems  Constitutional: Negative.   HENT: Negative.   Respiratory: Negative.   Cardiovascular: Negative.   Gastrointestinal: Negative.   Musculoskeletal: Positive for arthralgias.  Skin: Positive for color change and rash.  Hematological: Does not bruise/bleed easily.  Psychiatric/Behavioral: Negative.       Objective:    Physical Exam Vitals and nursing note reviewed.  Constitutional:      General: She is not in acute distress.    Appearance: Normal appearance. She is not ill-appearing or toxic-appearing.  HENT:     Head: Normocephalic and atraumatic.     Right Ear: External ear  normal.     Left Ear: External ear normal.  Eyes:  General: No scleral icterus.       Right eye: No discharge.        Left eye: No discharge.     Conjunctiva/sclera: Conjunctivae normal.  Pulmonary:     Effort: Pulmonary effort is normal.  Skin:    General: Skin is warm and dry.       Neurological:     Mental Status: She is alert and oriented to person, place, and time.  Psychiatric:        Mood and Affect: Mood normal.        Behavior: Behavior normal.     BP 120/68   Pulse 74   Temp 97.6 F (36.4 C) (Temporal)   Ht 5\' 6"  (1.676 m)   Wt 244 lb 9.6 oz (110.9 kg)   SpO2 97%   BMI 39.48 kg/m  Wt Readings from Last 3 Encounters:  11/17/19 244 lb 9.6 oz (110.9 kg)  11/07/19 241 lb (109.3 kg)  10/11/19 244 lb (110.7 kg)     Health Maintenance Due  Topic Date Due  . TETANUS/TDAP  09/29/2018  . INFLUENZA VACCINE  10/09/2019    There are no preventive care reminders to display for this patient.  Lab Results  Component Value Date   TSH 2.77 11/07/2019   Lab Results  Component Value Date   WBC 7.8 11/07/2019   HGB 12.7 11/07/2019   HCT 39.0 11/07/2019   MCV 86.1 11/07/2019   PLT 281 11/07/2019   Lab Results  Component Value Date   NA 138 11/07/2019   K 4.4 11/07/2019   CO2 21 11/07/2019   GLUCOSE 98 11/07/2019   BUN 16 11/07/2019   CREATININE 1.44 (H) 11/07/2019   BILITOT 0.5 11/07/2019   ALKPHOS 142 (H) 12/08/2018   AST 23 11/07/2019   ALT 26 11/07/2019   PROT 7.1 11/07/2019   ALBUMIN 4.0 12/08/2018   CALCIUM 9.7 11/07/2019   ANIONGAP 13 09/26/2013   GFR 50.00 (L) 09/03/2018   Lab Results  Component Value Date   CHOL 171 11/07/2019   Lab Results  Component Value Date   HDL 55 11/07/2019   Lab Results  Component Value Date   LDLCALC 99 11/07/2019   Lab Results  Component Value Date   TRIG 81 11/07/2019   Lab Results  Component Value Date   CHOLHDL 3.1 11/07/2019   Lab Results  Component Value Date   HGBA1C 5.5 12/08/2018       Assessment & Plan:   Problem List Items Addressed This Visit      Other   Cellulitis of right upper extremity - Primary   Relevant Medications   doxycycline (VIBRA-TABS) 100 MG tablet   triamcinolone ointment (KENALOG) 0.1 %      Meds ordered this encounter  Medications  . doxycycline (VIBRA-TABS) 100 MG tablet    Sig: Take 1 tablet (100 mg total) by mouth 2 (two) times daily.    Dispense:  20 tablet    Refill:  0  . triamcinolone ointment (KENALOG) 0.1 %    Sig: Apply 1 application topically 2 (two) times daily. For rash on arms only.    Dispense:  30 g    Refill:  0    Follow-up: Return Follow up with Dr. Etter Sjogren..   Information was given on doxycycline.  She will follow-up with Dr. Etter Sjogren. Libby Maw, MD

## 2019-11-17 NOTE — Telephone Encounter (Signed)
Patient is requesting someone to call her back in reference to what her lab results mean.   Please Advise

## 2019-11-17 NOTE — Patient Instructions (Addendum)
DUE TO COVID-19 ONLY ONE VISITOR IS ALLOWED IN WAITING ROOM (VISITOR WILL HAVE A TEMPERATURE CHECK ON ARRIVAL AND MUST WEAR A FACE MASK THE ENTIRE TIME.)  ONCE YOU ARE ADMITTED TO YOUR PRIVATE ROOM, THE SAME ONE VISITOR IS ALLOWED TO VISIT DURING VISITING HOURS ONLY.  Your COVID swab testing is scheduled for: 11/24/19 at  2:50 pm, You must self quarantine after your testing per handout given to you at the testing site. [address] [place], [place] [address]  (Must self quarantine after testing. Follow instructions on handout.)       Your procedure is scheduled on: 11/28/19  Report to [facility] AT: 6:20 A. M.   Call this number if you have problems the morning of surgery:[phone].   OUR ADDRESS IS [address].  WE ARE LOCATED IN THE [facility]                                   [facility].                                     REMEMBER:   DO NOT EAT FOOD OR DRINK LIQUIDS AFTER MIDNIGHT .    BRUSH YOUR TEETH THE MORNING OF SURGERY.  TAKE THESE MEDICATIONS MORNING OF SURGERY WITH A SIP OF WATER: Bupropion,clonazepam,doxycycline,escitalopram.  DO NOT WEAR JEWERLY, MAKE UP, OR NAIL POLISH.  DO NOT WEAR LOTIONS, POWDERS, PERFUMES/COLOGNE OR DEODORANT.  DO NOT SHAVE FOR 24 HOURS PRIOR TO DAY OF SURGERY.  YOU MAY BRING A SMALL OVERNIGHT BAG.  CONTACTS, GLASSES, OR DENTURES MAY NOT BE WORN TO SURGERY.                                    Cheryl Owens IS NOT RESPONSIBLE  FOR ANY BELONGINGS.                                                                    [facility] - Preparing for Surgery Before surgery, you can play an important role.  Because skin is not sterile, your skin needs to be as free of germs as possible.  You can reduce the number of germs on your skin by washing with CHG (chlorahexidine gluconate) soap before surgery.  CHG is an antiseptic cleaner which kills germs and bonds with the skin to continue killing germs even after washing. Please DO NOT  use if you have an allergy to CHG or antibacterial soaps.  If your skin becomes reddened/irritated stop using the CHG and inform your nurse when you arrive at Short Stay. Do not shave (including legs and underarms) for at least 48 hours prior to the first CHG shower.  You may shave your face/neck. Please follow these instructions carefully:  1.  Shower with CHG Soap the night before surgery and the  morning of Surgery.  2.  If you choose to wash your hair, wash your hair first as usual with your  normal  shampoo.  3.  After you shampoo, rinse your  hair and body thoroughly to remove the  shampoo.                           4.  Use CHG as you would any other liquid soap.  You can apply chg directly  to the skin and wash                       Gently with a scrungie or clean washcloth.  5.  Apply the CHG Soap to your body ONLY FROM THE NECK DOWN.   Do not use on face/ open                           Wound or open sores. Avoid contact with eyes, ears mouth and genitals (private parts).                       Wash face,  Genitals (private parts) with your normal soap.             6.  Wash thoroughly, paying special attention to the area where your surgery  will be performed.  7.  Thoroughly rinse your body with warm water from the neck down.  8.  DO NOT shower/wash with your normal soap after using and rinsing off  the CHG Soap.                9.  Pat yourself dry with a clean towel.            10.  Wear clean pajamas.            11.  Place clean sheets on your bed the night of your first shower and do not  sleep with pets. Day of Surgery : Do not apply any lotions/deodorants the morning of surgery.  Please wear clean clothes to the hospital/surgery center.  FAILURE TO FOLLOW THESE INSTRUCTIONS MAY RESULT IN THE CANCELLATION OF YOUR SURGERY PATIENT SIGNATURE_________________________________  NURSE  SIGNATURE__________________________________  ________________________________________________________________________   Adam Phenix  An incentive spirometer is a tool that can help keep your lungs clear and active. This tool measures how well you are filling your lungs with each breath. Taking long deep breaths may help reverse or decrease the chance of developing breathing (pulmonary) problems (especially infection) following:  A long period of time when you are unable to move or be active. BEFORE THE PROCEDURE   If the spirometer includes an indicator to show your best effort, your nurse or respiratory therapist will set it to a desired goal.  If possible, sit up straight or lean slightly forward. Try not to slouch.  Hold the incentive spirometer in an upright position. INSTRUCTIONS FOR USE  1. Sit on the edge of your bed if possible, or sit up as far as you can in bed or on a chair. 2. Hold the incentive spirometer in an upright position. 3. Breathe out normally. 4. Place the mouthpiece in your mouth and seal your lips tightly around it. 5. Breathe in slowly and as deeply as possible, raising the piston or the ball toward the top of the column. 6. Hold your breath for 3-5 seconds or for as long as possible. Allow the piston or ball to fall to the bottom of the column. 7. Remove the mouthpiece from your mouth and breathe out normally. 8. Rest for a few seconds and repeat Steps  1 through 7 at least 10 times every 1-2 hours when you are awake. Take your time and take a few normal breaths between deep breaths. 9. The spirometer may include an indicator to show your best effort. Use the indicator as a goal to work toward during each repetition. 10. After each set of 10 deep breaths, practice coughing to be sure your lungs are clear. If you have an incision (the cut made at the time of surgery), support your incision when coughing by placing a pillow or rolled up towels firmly  against it. Once you are able to get out of bed, walk around indoors and cough well. You may stop using the incentive spirometer when instructed by your caregiver.  RISKS AND COMPLICATIONS  Take your time so you do not get dizzy or light-headed.  If you are in pain, you may need to take or ask for pain medication before doing incentive spirometry. It is harder to take a deep breath if you are having pain. AFTER USE  Rest and breathe slowly and easily.  It can be helpful to keep track of a log of your progress. Your caregiver can provide you with a simple table to help with this. If you are using the spirometer at home, follow these instructions: [place] IF:   You are having difficultly using the spirometer.  You have trouble using the spirometer as often as instructed.  Your pain medication is not giving enough relief while using the spirometer.  You develop fever of 100.5 F (38.1 C) or higher. SEEK IMMEDIATE MEDICAL CARE IF:   You cough up bloody sputum that had not been present before.  You develop fever of 102 F (38.9 C) or greater.  You develop worsening pain at or near the incision site. MAKE SURE YOU:   Understand these instructions.  Will watch your condition.  Will get help right away if you are not doing well or get worse. Document Released: 07/07/2006 Document Revised: 05/19/2011 Document Reviewed: 09/07/2006 [facility] Patient Information 2014 [place], [place].   ________________________________________________________________________

## 2019-11-18 ENCOUNTER — Other Ambulatory Visit: Payer: Self-pay

## 2019-11-18 ENCOUNTER — Encounter (HOSPITAL_COMMUNITY)
Admission: RE | Admit: 2019-11-18 | Discharge: 2019-11-18 | Disposition: A | Discharge status: Home / Self Care | Payer: BC Managed Care – PPO | Source: Ambulatory Visit | Attending: Obstetrics & Gynecology | Admitting: Obstetrics & Gynecology

## 2019-11-18 ENCOUNTER — Encounter (HOSPITAL_COMMUNITY): Payer: Self-pay

## 2019-11-18 DIAGNOSIS — Z01818 Encounter for other preprocedural examination: Secondary | ICD-10-CM | POA: Insufficient documentation

## 2019-11-18 LAB — BASIC METABOLIC PANEL
Anion gap: 9 (ref 5–15)
BUN: 17 mg/dL (ref 6–20)
CO2: 21 mmol/L — ABNORMAL LOW (ref 22–32)
Calcium: 9.5 mg/dL (ref 8.9–10.3)
Chloride: 109 mmol/L (ref 98–111)
Creatinine, Ser: 1.17 mg/dL — ABNORMAL HIGH (ref 0.44–1.00)
GFR calc Af Amer: >60 mL/min (ref >60)
GFR calc non Af Amer: 53 mL/min — ABNORMAL LOW (ref >60)
Glucose, Bld: 68 mg/dL — ABNORMAL LOW (ref 70–99)
Potassium: 4.2 mmol/L (ref 3.5–5.1)
Sodium: 139 mmol/L (ref 135–145)

## 2019-11-18 LAB — CBC
HCT: 38.3 % (ref 36.0–46.0)
Hemoglobin: 12.1 g/dL (ref 12.0–15.0)
MCH: 27.8 pg (ref 26.0–34.0)
MCHC: 31.6 g/dL (ref 30.0–36.0)
MCV: 87.8 fL (ref 80.0–100.0)
Platelets: 285 10*3/uL (ref 150–400)
RBC: 4.36 MIL/uL (ref 3.87–5.11)
RDW: 14.1 % (ref 11.5–15.5)
WBC: 9.4 10*3/uL (ref 4.0–10.5)
nRBC: 0 % (ref 0.0–0.2)

## 2019-11-18 LAB — HEMOGLOBIN A1C
Hgb A1c MFr Bld: 5.3 % (ref 4.8–5.6)
Mean Plasma Glucose: 105.41 mg/dL

## 2019-11-18 NOTE — Progress Notes (Signed)
COVID Vaccine Completed: Yes Date COVID Vaccine completed:06/15/19 COVID vaccine manufacturer: [employer]     PCP - Dr. Harriette Bouillon. Cardiologist -   Chest x-ray -  EKG -  Stress Test -  ECHO -  Cardiac Cath -  Pacemaker/ICD device last checked:  Sleep Study -  CPAP -   Fasting Blood Sugar -  Checks Blood Sugar _____ times a day  Blood Thinner Instructions: Aspirin Instructions: Last Dose:  Anesthesia review:   Patient denies shortness of breath, fever, cough and chest pain at PAT appointment   Patient verbalized understanding of instructions that were given to them at the PAT appointment. Patient was also instructed that they will need to review over the PAT instructions again at home before surgery.

## 2019-11-18 NOTE — Telephone Encounter (Signed)
Called patient back and left VM with below advise.

## 2019-11-21 ENCOUNTER — Ambulatory Visit: Payer: BC Managed Care – PPO | Admitting: Family Medicine

## 2019-11-21 ENCOUNTER — Other Ambulatory Visit: Payer: Self-pay

## 2019-11-21 ENCOUNTER — Telehealth: Payer: Self-pay

## 2019-11-21 ENCOUNTER — Encounter: Payer: Self-pay | Admitting: Family Medicine

## 2019-11-21 VITALS — BP 110/80 | HR 72 | Temp 99.2°F | Resp 18 | Ht 67.0 in | Wt 246.6 lb

## 2019-11-21 DIAGNOSIS — R21 Rash and other nonspecific skin eruption: Secondary | ICD-10-CM | POA: Diagnosis not present

## 2019-11-21 DIAGNOSIS — R768 Other specified abnormal immunological findings in serum: Secondary | ICD-10-CM

## 2019-11-21 MED ORDER — PREDNISONE 10 MG PO TABS: ORAL_TABLET | ORAL | 0 refills | Status: DC

## 2019-11-21 MED ORDER — METHYLPREDNISOLONE ACETATE 80 MG/ML IJ SUSP
80.0000 mg | Freq: Once | INTRAMUSCULAR | Status: AC
  Administered 2019-11-21: 80 mg via INTRAMUSCULAR

## 2019-11-21 NOTE — Progress Notes (Signed)
Patient ID: Cheryl Owens, female    DOB: [birth year]  Age: 54 y.o. MRN: [id number]    Subjective:  Subjective  HPI Cheryl Owens presents for f/u labs and c/o rash that is very itchy on her arms    No bug bites, no new lotions, detergents   Review of Systems  Constitutional: Negative for appetite change, diaphoresis, fatigue and unexpected weight change.  Eyes: Negative for pain, redness and visual disturbance.  Respiratory: Negative for cough, chest tightness, shortness of breath and wheezing.   Cardiovascular: Negative for chest pain, palpitations and leg swelling.  Endocrine: Negative for cold intolerance, heat intolerance, polydipsia, polyphagia and polyuria.  Genitourinary: Negative for difficulty urinating, dysuria and frequency.  Skin: Positive for rash.  Neurological: Negative for dizziness, light-headedness, numbness and headaches.    History Past Medical History:  Diagnosis Date  . Anxiety   . Depression   . Dry mouth   . Fibroids   . Gallbladder disease   . High cholesterol   . HTN (hypertension) 2007   seen in ER  with headaches  . Hypertension   . Hypokalemia 2007   due to HCTZ  . Nervousness   . Obesity   . Pre-diabetes   . Stress   . Swelling of lower extremity   . Trouble in sleeping     She has a past surgical history that includes arm surgery; Cholecystectomy; Wisdom tooth extraction; Breast surgery (Right, 2016); and Ganglion cyst excision (N/A).   Her family history includes Anxiety disorder in her mother; Bipolar disorder in her paternal aunt; Breast cancer (age of onset: 38) in her maternal aunt; Cancer in her father, maternal aunt, and maternal grandmother; Depression in her mother; Hyperlipidemia in her mother; Hypertension in her maternal aunt, maternal grandmother, mother, and paternal grandmother; Kidney disease in her maternal aunt; Obesity in her mother; Prostate cancer in her father and paternal uncle.She reports that she has never smoked. She has  never used smokeless tobacco. She reports that she does not drink alcohol and does not use drugs.  Current Outpatient Medications on File Prior to Visit  Medication Sig Dispense Refill  . atorvastatin (LIPITOR) 20 MG tablet Take 1 tablet (20 mg total) by mouth daily. 90 tablet 3  . buPROPion (WELLBUTRIN XL) 300 MG 24 hr tablet TAKE 1 TABLET EVERY MORNING (Patient taking differently: Take 300 mg by mouth daily. ) 90 tablet 3  . clonazePAM (KLONOPIN) 0.5 MG tablet TAKE ONE-HALF (1/2) TABLET IN THE MORNING AND 2 TABLETS AT BEDTIME (Patient taking differently: Take 0.25-0.5 mg by mouth See admin instructions. Take 0.25 mg by mouth in the morning and 0.5 mg at bedtime) 225 tablet 0  . doxycycline (VIBRA-TABS) 100 MG tablet Take 1 tablet (100 mg total) by mouth 2 (two) times daily. 20 tablet 0  . escitalopram (LEXAPRO) 10 MG tablet TAKE 1 TABLET DAILY (Patient taking differently: Take 10 mg by mouth daily. ) 90 tablet 0  . labetalol (NORMODYNE) 100 MG tablet Take 1 tablet (100 mg total) by mouth 2 (two) times daily. 180 tablet 1  . megestrol (MEGACE) 40 MG tablet Take one tablet po bid until surgery day. (Patient taking differently: Take 40 mg by mouth 2 (two) times daily. ) 65 tablet 0  . spironolactone (ALDACTONE) 25 MG tablet Take 1 tablet (25 mg total) by mouth 2 (two) times daily. 180 tablet 3  . triamcinolone ointment (KENALOG) 0.1 % Apply 1 application topically 2 (two) times daily. For rash on arms  only. 30 g 0  . Vitamin D, Ergocalciferol, (DRISDOL) 1.25 MG (50000 UNIT) CAPS capsule Take 1 capsule (50,000 Units total) by mouth every 7 (seven) days. 12 capsule 1  . PREDNISONE PO Take by mouth daily. DOSE PAK     No current facility-administered medications on file prior to visit.     Objective:  Objective  Physical Exam Vitals and nursing note reviewed.  Constitutional:      Appearance: She is well-developed.  HENT:     Head: Normocephalic and atraumatic.  Eyes:     Conjunctiva/sclera:  Conjunctivae normal.  Neck:     Thyroid: No thyromegaly.     Vascular: No carotid bruit or JVD.  Cardiovascular:     Rate and Rhythm: Normal rate and regular rhythm.     Heart sounds: Normal heart sounds. No murmur heard.   Pulmonary:     Effort: Pulmonary effort is normal. No respiratory distress.     Breath sounds: Normal breath sounds. No wheezing or rales.  Chest:     Chest wall: No tenderness.  Musculoskeletal:     Cervical back: Normal range of motion and neck supple.  Skin:    Findings: Erythema and rash present.       Neurological:     Mental Status: She is alert and oriented to person, place, and time.    BP 110/80 (BP Location: Right Arm, Patient Position: Sitting, Cuff Size: Large)   Pulse 72   Temp 99.2 F (37.3 C) (Oral)   Resp 18   Ht 5\' 7"  (1.702 m)   Wt 246 lb 9.6 oz (111.9 kg)   SpO2 98%   BMI 38.62 kg/m  Wt Readings from Last 3 Encounters:  11/21/19 246 lb 9.6 oz (111.9 kg)  11/18/19 245 lb (111.1 kg)  11/17/19 244 lb 9.6 oz (110.9 kg)     Lab Results  Component Value Date   WBC 9.4 11/18/2019   HGB 12.1 11/18/2019   HCT 38.3 11/18/2019   PLT 285 11/18/2019   GLUCOSE 68 (L) 11/18/2019   CHOL 171 11/07/2019   TRIG 81 11/07/2019   HDL 55 11/07/2019   LDLCALC 99 11/07/2019   ALT 26 11/07/2019   AST 23 11/07/2019   NA 139 11/18/2019   K 4.2 11/18/2019   CL 109 11/18/2019   CREATININE 1.17 (H) 11/18/2019   BUN 17 11/18/2019   CO2 21 (L) 11/18/2019   TSH 2.77 11/07/2019   HGBA1C 5.3 11/18/2019   MICROALBUR 3.1 (H) 05/05/2007    No results found.   Assessment & Plan:  Plan  I am having Cheryl Owens "[name]" start on predniSONE. I am also having her maintain her buPROPion, clonazePAM, escitalopram, megestrol, spironolactone, labetalol, atorvastatin, Vitamin D (Ergocalciferol), doxycycline, and triamcinolone ointment. We administered methylPREDNISolone acetate.  Meds ordered this encounter  Medications  . predniSONE (DELTASONE) 10 MG  tablet    Sig: TAKE 3 TABLETS PO QD FOR 3 DAYS THEN TAKE 2 TABLETS PO QD FOR 3 DAYS THEN TAKE 1 TABLET PO QD FOR 3 DAYS THEN TAKE 1/2 TAB PO QD FOR 3 DAYS    Dispense:  20 tablet    Refill:  0  . methylPREDNISolone acetate (DEPO-MEDROL) injection 80 mg    Problem List Items Addressed This Visit    None    Visit Diagnoses    ANA positive    -  Primary   Relevant Orders   Ambulatory referral to Rheumatology   Rash  Relevant Medications   predniSONE (DELTASONE) 10 MG tablet   methylPREDNISolone acetate (DEPO-MEDROL) injection 80 mg (Completed)   Other Relevant Orders   Ambulatory referral to Rheumatology      Follow-up: Return in about 6 months (around 05/20/2020), or if symptoms worsen or fail to improve.  Ann Held, DO

## 2019-11-21 NOTE — Telephone Encounter (Signed)
Spoke with patient and informed her. °

## 2019-11-21 NOTE — Telephone Encounter (Signed)
Patient saw Dr. [name] today and her note is in the chart.  She diagnosed erythema and a rash.  (patient called it "cellulitis" but I did not see that in Dr. Nonda Lou note.  She has been prescribed steroids and being referred to Rhematology for positive ANA.  Patient asked if you would like at her office note from that visit and advise if this will interfere with her having surgery next Monday. She really hopes it does not because her insurance ends 12/08/19.

## 2019-11-21 NOTE — Patient Instructions (Signed)
Rash, Adult A rash is a change in the color of your skin. A rash can also change the way your skin feels. There are many different conditions and factors that can cause a rash. Some rashes may disappear after a few days, but some may last for a few weeks. Common causes of rashes include:  Viral infections, such as: ? Colds. ? Measles. ? Hand, foot, and mouth disease.  Bacterial infections, such as: ? Scarlet fever. ? Impetigo.  Fungal infections, such as Candida.  Allergic reactions to food, medicines, or skin care products. Follow these instructions at home: The goal of treatment is to stop the itching and keep the rash from spreading. Pay attention to any changes in your symptoms. Follow these instructions to help with your condition: Medicine Take or apply over-the-counter and prescription medicines only as told by your health care provider. These may include:  Corticosteroid creams to treat red or swollen skin.  Anti-itch lotions.  Oral allergy medicines (antihistamines).  Oral corticosteroids for severe symptoms.  Skin care  Apply cool compresses to the affected areas.  Do not scratch or rub your skin.  Avoid covering the rash. Make sure the rash is exposed to air as much as possible. Managing itching and discomfort  Avoid hot showers or baths, which can make itching worse. A cold shower may help.  Try taking a bath with: ? Epsom salts. Follow manufacturer instructions on the packaging. You can get these at your local pharmacy or grocery store. ? Baking soda. Pour a small amount into the bath as told by your health care provider. ? Colloidal oatmeal. Follow manufacturer instructions on the packaging. You can get this at your local pharmacy or grocery store.  Try applying baking soda paste to your skin. Stir water into baking soda until it reaches a paste-like consistency.  Try applying calamine lotion. This is an over-the-counter lotion that helps to relieve  itchiness.  Keep cool and out of the sun. Sweating and being hot can make itching worse. General instructions   Rest as needed.  Drink enough fluid to keep your urine pale yellow.  Wear loose-fitting clothing.  Avoid scented soaps, detergents, and perfumes. Use gentle soaps, detergents, perfumes, and other cosmetic products.  Avoid any substance that causes your rash. Keep a journal to help track what causes your rash. Write down: ? What you eat. ? What cosmetic products you use. ? What you drink. ? What you wear. This includes jewelry.  Keep all follow-up visits as told by your health care provider. This is important. Contact a health care provider if:  You sweat at night.  You lose weight.  You urinate more than normal.  You urinate less than normal, or you notice that your urine is a darker color than usual.  You feel weak.  You vomit.  Your skin or the whites of your eyes look yellow (jaundice).  Your skin: ? Tingles. ? Is numb.  Your rash: ? Does not go away after several days. ? Gets worse.  You are: ? Unusually thirsty. ? More tired than normal.  You have: ? New symptoms. ? Pain in your abdomen. ? A fever. ? Diarrhea. Get help right away if you:  Have a fever and your symptoms suddenly get worse.  Develop confusion.  Have a severe headache or a stiff neck.  Have severe joint pains or stiffness.  Have a seizure.  Develop a rash that covers all or most of your body. The rash may   or may not be painful.  Develop blisters that: ? Are on top of the rash. ? Grow larger or grow together. ? Are painful. ? Are inside your nose or mouth.  Develop a rash that: ? Looks like purple pinprick-sized spots all over your body. ? Has a "bull's eye" or looks like a target. ? Is not related to sun exposure, is red and painful, and causes your skin to peel. Summary  A rash is a change in the color of your skin. Some rashes disappear after a few days,  but some may last for a few weeks.  The goal of treatment is to stop the itching and keep the rash from spreading.  Take or apply over-the-counter and prescription medicines only as told by your health care provider.  Contact a health care provider if you have new or worsening symptoms.  Keep all follow-up visits as told by your health care provider. This is important. This information is not intended to replace advice given to you by your health care provider. Make sure you discuss any questions you have with your health care provider. Document Revised: 06/18/2018 Document Reviewed: 09/28/2017 Elsevier Patient Education  2020 [employer]  

## 2019-11-21 NOTE — Telephone Encounter (Signed)
As long as everything remains stable, especially her BP and the rash, we can proceed with surgery.

## 2019-11-23 NOTE — Progress Notes (Addendum)
Pt called left message stated she is taking prednisone and wants to know if this will interfere with surgery.  Called pt via phone, pt stated she has started taking prednisone dose pak needs to know if can take dos and if will interfere with surgery.  Stated that she takes it 1100 qd, did not know dosage.  Pt verbalized understanding the prednisone will not interfere with her surgery and she would not need to take it since she arrives [employer].

## 2019-11-24 ENCOUNTER — Other Ambulatory Visit (HOSPITAL_COMMUNITY)
Admission: RE | Admit: 2019-11-24 | Discharge: 2019-11-24 | Disposition: A | Discharge status: Home / Self Care | Payer: BC Managed Care – PPO | Source: Ambulatory Visit | Attending: Obstetrics & Gynecology | Admitting: Obstetrics & Gynecology

## 2019-11-24 DIAGNOSIS — Z01812 Encounter for preprocedural laboratory examination: Secondary | ICD-10-CM | POA: Insufficient documentation

## 2019-11-24 DIAGNOSIS — Z20822 Contact with and (suspected) exposure to covid-19: Secondary | ICD-10-CM | POA: Diagnosis not present

## 2019-11-24 LAB — SARS CORONAVIRUS 2 (TAT 6-24 HRS): SARS Coronavirus 2: NEGATIVE

## 2019-11-27 ENCOUNTER — Encounter (HOSPITAL_BASED_OUTPATIENT_CLINIC_OR_DEPARTMENT_OTHER): Payer: Self-pay | Admitting: Obstetrics & Gynecology

## 2019-11-27 NOTE — Anesthesia Preprocedure Evaluation (Addendum)
Anesthesia Evaluation  Patient identified by MRN, date of birth, ID band  Reviewed: Allergy & Precautions, NPO status , Patient's Chart, lab work & pertinent test results  Airway Mallampati: II  TM Distance: >3 FB Neck ROM: Full   Comment: Torus palatinus Dental no notable dental hx.    Pulmonary neg pulmonary ROS,    Pulmonary exam normal breath sounds clear to auscultation       Cardiovascular Exercise Tolerance: Good hypertension, Normal cardiovascular exam Rhythm:Regular Rate:Normal  18-Nov-2019 14:15:32 [facility]-PRE ROUTINE RECORD Normal sinus rhythm Nonspecific T wave abnormality Abnormal ECG Confirmed by Eleonore Chiquito [phone]) on 11/22/2019 7:43:14 PM   Neuro/Psych PSYCHIATRIC DISORDERS Anxiety Depression negative neurological ROS     GI/Hepatic negative GI ROS,   Endo/Other  Metabolic syndrome X Obesity BMI 38  Renal/GU Renal InsufficiencyRenal disease (Cr 1.17)     Musculoskeletal negative musculoskeletal ROS (+)   Abdominal   Peds  Hematology negative hematology ROS (+)   Anesthesia Other Findings Recent left arm cellulitis (now resolved; on doxycycline and prednisone)  Reproductive/Obstetrics                            Anesthesia Physical Anesthesia Plan  ASA: III  Anesthesia Plan: General   Post-op Pain Management:    Induction: Intravenous  PONV Risk Score and Plan: 3 and Midazolam, Scopolamine patch - Pre-op, Ondansetron, Treatment may vary due to age or medical condition and Dexamethasone  Airway Management Planned: Oral ETT  Additional Equipment: None  Intra-op Plan:   Post-operative Plan: Extubation in OR  Informed Consent: I have reviewed the patients History and Physical, chart, labs and discussed the procedure including the risks, benefits and alternatives for the proposed anesthesia with the patient or authorized representative who has  indicated his/her understanding and acceptance.     Dental advisory given  Plan Discussed with:   Anesthesia Plan Comments:         Anesthesia Quick Evaluation

## 2019-11-28 ENCOUNTER — Other Ambulatory Visit: Payer: Self-pay

## 2019-11-28 ENCOUNTER — Encounter (HOSPITAL_BASED_OUTPATIENT_CLINIC_OR_DEPARTMENT_OTHER): Payer: Self-pay | Admitting: Obstetrics & Gynecology

## 2019-11-28 ENCOUNTER — Ambulatory Visit (HOSPITAL_BASED_OUTPATIENT_CLINIC_OR_DEPARTMENT_OTHER): Payer: BC Managed Care – PPO | Admitting: Anesthesiology

## 2019-11-28 ENCOUNTER — Encounter (HOSPITAL_BASED_OUTPATIENT_CLINIC_OR_DEPARTMENT_OTHER)
Admission: RE | Disposition: A | Discharge status: Home / Self Care | Payer: Self-pay | Source: Home / Self Care | Attending: Obstetrics & Gynecology

## 2019-11-28 ENCOUNTER — Ambulatory Visit (HOSPITAL_BASED_OUTPATIENT_CLINIC_OR_DEPARTMENT_OTHER)
Admission: RE | Admit: 2019-11-28 | Discharge: 2019-11-29 | Disposition: A | Discharge status: Home / Self Care | Payer: BC Managed Care – PPO | Attending: Obstetrics & Gynecology | Admitting: Obstetrics & Gynecology

## 2019-11-28 DIAGNOSIS — E669 Obesity, unspecified: Secondary | ICD-10-CM | POA: Diagnosis not present

## 2019-11-28 DIAGNOSIS — F419 Anxiety disorder, unspecified: Secondary | ICD-10-CM | POA: Diagnosis not present

## 2019-11-28 DIAGNOSIS — Z79899 Other long term (current) drug therapy: Secondary | ICD-10-CM | POA: Diagnosis not present

## 2019-11-28 DIAGNOSIS — E7800 Pure hypercholesterolemia, unspecified: Secondary | ICD-10-CM | POA: Insufficient documentation

## 2019-11-28 DIAGNOSIS — I10 Essential (primary) hypertension: Secondary | ICD-10-CM | POA: Diagnosis not present

## 2019-11-28 DIAGNOSIS — D259 Leiomyoma of uterus, unspecified: Secondary | ICD-10-CM | POA: Insufficient documentation

## 2019-11-28 DIAGNOSIS — F329 Major depressive disorder, single episode, unspecified: Secondary | ICD-10-CM | POA: Diagnosis not present

## 2019-11-28 DIAGNOSIS — Z6839 Body mass index (BMI) 39.0-39.9, adult: Secondary | ICD-10-CM | POA: Insufficient documentation

## 2019-11-28 DIAGNOSIS — E785 Hyperlipidemia, unspecified: Secondary | ICD-10-CM | POA: Diagnosis not present

## 2019-11-28 DIAGNOSIS — I129 Hypertensive chronic kidney disease with stage 1 through stage 4 chronic kidney disease, or unspecified chronic kidney disease: Secondary | ICD-10-CM | POA: Diagnosis not present

## 2019-11-28 DIAGNOSIS — D251 Intramural leiomyoma of uterus: Secondary | ICD-10-CM | POA: Diagnosis not present

## 2019-11-28 DIAGNOSIS — Z9889 Other specified postprocedural states: Secondary | ICD-10-CM

## 2019-11-28 DIAGNOSIS — N921 Excessive and frequent menstruation with irregular cycle: Secondary | ICD-10-CM | POA: Diagnosis not present

## 2019-11-28 DIAGNOSIS — N838 Other noninflammatory disorders of ovary, fallopian tube and broad ligament: Secondary | ICD-10-CM | POA: Diagnosis not present

## 2019-11-28 DIAGNOSIS — N840 Polyp of corpus uteri: Secondary | ICD-10-CM | POA: Diagnosis not present

## 2019-11-28 DIAGNOSIS — N888 Other specified noninflammatory disorders of cervix uteri: Secondary | ICD-10-CM | POA: Diagnosis not present

## 2019-11-28 DIAGNOSIS — N1831 Chronic kidney disease, stage 3a: Secondary | ICD-10-CM | POA: Diagnosis not present

## 2019-11-28 HISTORY — PX: ROBOTIC ASSISTED TOTAL HYSTERECTOMY WITH BILATERAL SALPINGO OOPHERECTOMY: SHX6086

## 2019-11-28 HISTORY — DX: Cellulitis, unspecified: L03.90

## 2019-11-28 LAB — CBC
HCT: 35.8 % — ABNORMAL LOW (ref 36.0–46.0)
Hemoglobin: 11.5 g/dL — ABNORMAL LOW (ref 12.0–15.0)
MCH: 28.2 pg (ref 26.0–34.0)
MCHC: 32.1 g/dL (ref 30.0–36.0)
MCV: 87.7 fL (ref 80.0–100.0)
Platelets: 275 10*3/uL (ref 150–400)
RBC: 4.08 MIL/uL (ref 3.87–5.11)
RDW: 14.5 % (ref 11.5–15.5)
WBC: 19.8 10*3/uL — ABNORMAL HIGH (ref 4.0–10.5)
nRBC: 0 % (ref 0.0–0.2)

## 2019-11-28 LAB — TYPE AND SCREEN
ABO/RH(D): A POS
Antibody Screen: NEGATIVE

## 2019-11-28 LAB — POCT PREGNANCY, URINE: Preg Test, Ur: NEGATIVE

## 2019-11-28 LAB — ABO/RH: ABO/RH(D): A POS

## 2019-11-28 SURGERY — HYSTERECTOMY, TOTAL, ROBOT-ASSISTED, LAPAROSCOPIC, WITH BILATERAL SALPINGO-OOPHORECTOMY
Anesthesia: General | Laterality: Bilateral

## 2019-11-28 MED ORDER — FENTANYL CITRATE (PF) 100 MCG/2ML IJ SOLN
INTRAMUSCULAR | Status: DC | PRN
Start: 2019-11-28 — End: 2019-11-28
  Administered 2019-11-28: 100 ug via INTRAVENOUS
  Administered 2019-11-28 (×3): 50 ug via INTRAVENOUS

## 2019-11-28 MED ORDER — MIDAZOLAM HCL 5 MG/5ML IJ SOLN
INTRAMUSCULAR | Status: DC | PRN
  Administered 2019-11-28: 2 mg via INTRAVENOUS

## 2019-11-28 MED ORDER — CEFAZOLIN SODIUM-DEXTROSE 2-4 GM/100ML-% IV SOLN
2.0000 g | INTRAVENOUS | Status: AC
  Administered 2019-11-28: 2 g via INTRAVENOUS

## 2019-11-28 MED ORDER — FENTANYL CITRATE (PF) 250 MCG/5ML IJ SOLN
INTRAMUSCULAR | Status: AC
  Filled 2019-11-28: qty 5

## 2019-11-28 MED ORDER — ROCURONIUM BROMIDE 10 MG/ML (PF) SYRINGE
PREFILLED_SYRINGE | INTRAVENOUS | Status: AC
  Filled 2019-11-28: qty 10

## 2019-11-28 MED ORDER — AMISULPRIDE (ANTIEMETIC) 5 MG/2ML IV SOLN: 10.0000 mg | Freq: Once | INTRAVENOUS | Status: DC | PRN

## 2019-11-28 MED ORDER — MIDAZOLAM HCL 2 MG/2ML IJ SOLN
INTRAMUSCULAR | Status: AC
  Filled 2019-11-28: qty 2

## 2019-11-28 MED ORDER — HYDROMORPHONE HCL 1 MG/ML IJ SOLN: 0.2500 mg | INTRAMUSCULAR | Status: DC | PRN

## 2019-11-28 MED ORDER — ONDANSETRON HCL 4 MG/2ML IJ SOLN
INTRAMUSCULAR | Status: DC | PRN
  Administered 2019-11-28: 4 mg via INTRAVENOUS

## 2019-11-28 MED ORDER — SODIUM CHLORIDE 0.9 % IR SOLN
Status: DC | PRN
  Administered 2019-11-28: 3000 mL

## 2019-11-28 MED ORDER — POVIDONE-IODINE 10 % EX SWAB
2.0000 "application " | Freq: Once | CUTANEOUS | Status: AC
  Administered 2019-11-28: 2 via TOPICAL

## 2019-11-28 MED ORDER — KETAMINE HCL 10 MG/ML IJ SOLN
INTRAMUSCULAR | Status: AC
  Filled 2019-11-28: qty 1

## 2019-11-28 MED ORDER — LIDOCAINE 2% (20 MG/ML) 5 ML SYRINGE
INTRAMUSCULAR | Status: DC | PRN
  Administered 2019-11-28: 80 mg via INTRAVENOUS

## 2019-11-28 MED ORDER — PROMETHAZINE HCL 25 MG/ML IJ SOLN: 6.2500 mg | INTRAMUSCULAR | Status: DC | PRN

## 2019-11-28 MED ORDER — SPIRONOLACTONE 25 MG PO TABS
25.0000 mg | ORAL_TABLET | Freq: Two times a day (BID) | ORAL | Status: DC
  Administered 2019-11-28: 25 mg via ORAL
  Filled 2019-11-28: qty 1

## 2019-11-28 MED ORDER — ACETAMINOPHEN 325 MG PO TABS: 650.0000 mg | ORAL_TABLET | ORAL | Status: DC | PRN

## 2019-11-28 MED ORDER — LABETALOL HCL 100 MG PO TABS
100.0000 mg | ORAL_TABLET | Freq: Two times a day (BID) | ORAL | Status: DC
  Administered 2019-11-28: 100 mg via ORAL
  Filled 2019-11-28: qty 1

## 2019-11-28 MED ORDER — DEXAMETHASONE SODIUM PHOSPHATE 10 MG/ML IJ SOLN
INTRAMUSCULAR | Status: AC
  Filled 2019-11-28: qty 1

## 2019-11-28 MED ORDER — OXYCODONE-ACETAMINOPHEN 7.5-325 MG PO TABS: 1.0000 | ORAL_TABLET | Freq: Four times a day (QID) | ORAL | 0 refills | Status: DC | PRN

## 2019-11-28 MED ORDER — KETAMINE HCL 10 MG/ML IJ SOLN
INTRAMUSCULAR | Status: DC | PRN
  Administered 2019-11-28: 30 mg via INTRAVENOUS

## 2019-11-28 MED ORDER — HYDROCODONE-ACETAMINOPHEN 5-325 MG PO TABS
ORAL_TABLET | ORAL | Status: AC
  Filled 2019-11-28: qty 1

## 2019-11-28 MED ORDER — ORAL CARE MOUTH RINSE: 15.0000 mL | Freq: Once | OROMUCOSAL | Status: DC

## 2019-11-28 MED ORDER — PROPOFOL 10 MG/ML IV BOLUS
INTRAVENOUS | Status: AC
  Filled 2019-11-28: qty 20

## 2019-11-28 MED ORDER — LACTATED RINGERS IV SOLN: INTRAVENOUS | Status: DC

## 2019-11-28 MED ORDER — PROPOFOL 10 MG/ML IV BOLUS
INTRAVENOUS | Status: DC | PRN
  Administered 2019-11-28: 180 mg via INTRAVENOUS

## 2019-11-28 MED ORDER — OXYCODONE HCL 5 MG/5ML PO SOLN: 5.0000 mg | Freq: Once | ORAL | Status: DC | PRN

## 2019-11-28 MED ORDER — OXYCODONE HCL 5 MG PO TABS: 5.0000 mg | ORAL_TABLET | Freq: Once | ORAL | Status: DC | PRN

## 2019-11-28 MED ORDER — ACETAMINOPHEN 500 MG PO TABS
ORAL_TABLET | ORAL | Status: AC
  Filled 2019-11-28: qty 2

## 2019-11-28 MED ORDER — CHLORHEXIDINE GLUCONATE 0.12 % MT SOLN: 15.0000 mL | Freq: Once | OROMUCOSAL | Status: DC

## 2019-11-28 MED ORDER — SCOPOLAMINE 1 MG/3DAYS TD PT72
MEDICATED_PATCH | TRANSDERMAL | Status: AC
  Filled 2019-11-28: qty 1

## 2019-11-28 MED ORDER — LIDOCAINE 2% (20 MG/ML) 5 ML SYRINGE
INTRAMUSCULAR | Status: AC
  Filled 2019-11-28: qty 5

## 2019-11-28 MED ORDER — ACETAMINOPHEN 500 MG PO TABS
1000.0000 mg | ORAL_TABLET | Freq: Once | ORAL | Status: AC
  Administered 2019-11-28: 1000 mg via ORAL

## 2019-11-28 MED ORDER — CEFAZOLIN SODIUM-DEXTROSE 2-4 GM/100ML-% IV SOLN
INTRAVENOUS | Status: AC
  Filled 2019-11-28: qty 100

## 2019-11-28 MED ORDER — OXYCODONE-ACETAMINOPHEN 5-325 MG PO TABS: 2.0000 | ORAL_TABLET | ORAL | Status: DC | PRN

## 2019-11-28 MED ORDER — LIDOCAINE HCL 2 % IJ SOLN
INTRAMUSCULAR | Status: AC
  Filled 2019-11-28: qty 20

## 2019-11-28 MED ORDER — SCOPOLAMINE 1 MG/3DAYS TD PT72
1.0000 | MEDICATED_PATCH | TRANSDERMAL | Status: DC
  Administered 2019-11-28: 1.5 mg via TRANSDERMAL

## 2019-11-28 MED ORDER — DEXAMETHASONE SODIUM PHOSPHATE 10 MG/ML IJ SOLN
INTRAMUSCULAR | Status: DC | PRN
  Administered 2019-11-28: 10 mg via INTRAVENOUS

## 2019-11-28 MED ORDER — LACTATED RINGERS IV SOLN
INTRAVENOUS | Status: DC
  Administered 2019-11-28: 125 mL via INTRAVENOUS

## 2019-11-28 MED ORDER — LIDOCAINE 20MG/ML (2%) 15 ML SYRINGE OPTIME
INTRAMUSCULAR | Status: DC | PRN
  Administered 2019-11-28: 1.5 mg/kg/h via INTRAVENOUS

## 2019-11-28 MED ORDER — ROCURONIUM BROMIDE 10 MG/ML (PF) SYRINGE
PREFILLED_SYRINGE | INTRAVENOUS | Status: DC | PRN
  Administered 2019-11-28 (×2): 20 mg via INTRAVENOUS
  Administered 2019-11-28: 60 mg via INTRAVENOUS

## 2019-11-28 MED ORDER — SUGAMMADEX SODIUM 200 MG/2ML IV SOLN
INTRAVENOUS | Status: DC | PRN
  Administered 2019-11-28: 200 mg via INTRAVENOUS

## 2019-11-28 MED ORDER — HYDROCODONE-ACETAMINOPHEN 5-325 MG PO TABS
1.0000 | ORAL_TABLET | ORAL | Status: DC | PRN
  Administered 2019-11-28 – 2019-11-29 (×2): 1 via ORAL

## 2019-11-28 MED ORDER — BUPIVACAINE HCL (PF) 0.25 % IJ SOLN
INTRAMUSCULAR | Status: DC | PRN
  Administered 2019-11-28: 14 mL

## 2019-11-28 MED ORDER — ONDANSETRON HCL 4 MG/2ML IJ SOLN
INTRAMUSCULAR | Status: AC
  Filled 2019-11-28: qty 2

## 2019-11-28 SURGICAL SUPPLY — 68 items
ADH SKN CLS APL DERMABOND .7 (GAUZE/BANDAGES/DRESSINGS) ×1
BARRIER ADHS 3X4 INTERCEED (GAUZE/BANDAGES/DRESSINGS) IMPLANT
BRR ADH 4X3 ABS CNTRL BYND (GAUZE/BANDAGES/DRESSINGS)
CANISTER SUCT 3000ML PPV (MISCELLANEOUS) ×2 IMPLANT
CATH FOLEY 3WAY  5CC 16FR (CATHETERS) ×2
CATH FOLEY 3WAY 5CC 16FR (CATHETERS) ×1 IMPLANT
COVER BACK TABLE 60X90IN (DRAPES) ×2 IMPLANT
COVER TIP SHEARS 8 DVNC (MISCELLANEOUS) ×1 IMPLANT
COVER TIP SHEARS 8MM DA VINCI (MISCELLANEOUS) ×2
COVER WAND RF STERILE (DRAPES) ×2 IMPLANT
DECANTER SPIKE VIAL GLASS SM (MISCELLANEOUS) IMPLANT
DEFOGGER SCOPE WARMER CLEARIFY (MISCELLANEOUS) ×2 IMPLANT
DERMABOND ADVANCED (GAUZE/BANDAGES/DRESSINGS) ×1
DERMABOND ADVANCED .7 DNX12 (GAUZE/BANDAGES/DRESSINGS) ×1 IMPLANT
DRAPE ARM DVNC X/XI (DISPOSABLE) ×4 IMPLANT
DRAPE COLUMN DVNC XI (DISPOSABLE) ×1 IMPLANT
DRAPE DA VINCI XI ARM (DISPOSABLE) ×8
DRAPE DA VINCI XI COLUMN (DISPOSABLE) ×2
DRAPE UTILITY XL STRL (DRAPES) ×2 IMPLANT
DURAPREP 26ML APPLICATOR (WOUND CARE) ×2 IMPLANT
ELECT REM PT RETURN 9FT ADLT (ELECTROSURGICAL) ×2
ELECTRODE REM PT RTRN 9FT ADLT (ELECTROSURGICAL) ×1 IMPLANT
FLUID I.V. SORBITOL 3000CC BAG (IV SOLUTION) ×2 IMPLANT
GAUZE PETROLATUM 1 X8 (GAUZE/BANDAGES/DRESSINGS) ×4 IMPLANT
GLOVE BIO SURGEON STRL SZ 6.5 (GLOVE) ×6 IMPLANT
GLOVE BIO SURGEON STRL SZ8 (GLOVE) ×4 IMPLANT
GLOVE BIOGEL PI IND STRL 7.0 (GLOVE) ×5 IMPLANT
GLOVE BIOGEL PI IND STRL 8 (GLOVE) IMPLANT
GLOVE BIOGEL PI INDICATOR 7.0 (GLOVE) ×5
GLOVE BIOGEL PI INDICATOR 8 (GLOVE)
GOWN STRL REUS W/TWL XL LVL3 (GOWN DISPOSABLE) ×2 IMPLANT
HOLDER FOLEY CATH W/STRAP (MISCELLANEOUS) ×2 IMPLANT
IRRIG SUCT STRYKERFLOW 2 WTIP (MISCELLANEOUS) ×2
IRRIGATION SUCT STRKRFLW 2 WTP (MISCELLANEOUS) ×1 IMPLANT
IV NS IRRIG 3000ML ARTHROMATIC (IV SOLUTION) ×2 IMPLANT
LEGGING LITHOTOMY PAIR STRL (DRAPES) ×2 IMPLANT
OBTURATOR OPTICAL STANDARD 8MM (TROCAR) ×2
OBTURATOR OPTICAL STND 8 DVNC (TROCAR) ×1
OBTURATOR OPTICALSTD 8 DVNC (TROCAR) ×1 IMPLANT
OCCLUDER COLPOPNEUMO (BALLOONS) ×2 IMPLANT
PACK ROBOT WH (CUSTOM PROCEDURE TRAY) ×2 IMPLANT
PACK ROBOTIC GOWN (GOWN DISPOSABLE) ×2 IMPLANT
PACK TRENDGUARD 450 HYBRID PRO (MISCELLANEOUS) ×1 IMPLANT
PAD OB MATERNITY 4.3X12.25 (PERSONAL CARE ITEMS) ×2 IMPLANT
PAD PREP 24X48 CUFFED NSTRL (MISCELLANEOUS) ×2 IMPLANT
POUCH ENDO CATCH II 15MM (MISCELLANEOUS) IMPLANT
PROTECTOR NERVE ULNAR (MISCELLANEOUS) ×4 IMPLANT
RTRCTR WOUND ALEXIS 18CM SML (INSTRUMENTS)
SAVER CELL AAL HAEMONETICS (INSTRUMENTS) IMPLANT
SEAL CANN UNIV 5-8 DVNC XI (MISCELLANEOUS) ×4 IMPLANT
SEAL XI 5MM-8MM UNIVERSAL (MISCELLANEOUS) ×8
SET IRRIG Y TYPE TUR BLADDER L (SET/KITS/TRAYS/PACK) IMPLANT
SET TRI-LUMEN FLTR TB AIRSEAL (TUBING) ×2 IMPLANT
SUT ETHIBOND 0 (SUTURE) IMPLANT
SUT VIC AB 4-0 PS2 27 (SUTURE) ×6 IMPLANT
SUT VICRYL 0 UR6 27IN ABS (SUTURE) ×2 IMPLANT
SUT VLOC 180 0 9IN  GS21 (SUTURE) ×2
SUT VLOC 180 0 9IN GS21 (SUTURE) ×1 IMPLANT
SUT VLOC 180 2-0 6IN GS21 (SUTURE) IMPLANT
TIP RUMI ORANGE 6.7MMX12CM (TIP) IMPLANT
TIP UTERINE 5.1X6CM LAV DISP (MISCELLANEOUS) IMPLANT
TIP UTERINE 6.7X10CM GRN DISP (MISCELLANEOUS) ×2 IMPLANT
TIP UTERINE 6.7X6CM WHT DISP (MISCELLANEOUS) IMPLANT
TIP UTERINE 6.7X8CM BLUE DISP (MISCELLANEOUS) IMPLANT
TOWEL OR 17X26 10 PK STRL BLUE (TOWEL DISPOSABLE) ×2 IMPLANT
TRENDGUARD 450 HYBRID PRO PACK (MISCELLANEOUS) ×2
TROCAR PORT AIRSEAL 5X120 (TROCAR) ×2 IMPLANT
WATER STERILE IRR 1000ML POUR (IV SOLUTION) ×2 IMPLANT

## 2019-11-28 NOTE — Anesthesia Procedure Notes (Signed)
Procedure Name: Intubation Date/Time: 11/28/2019 8:39 AM Performed by: Aaliyan Brinkmeier D, CRNA Pre-anesthesia Checklist: Patient identified, Emergency Drugs available, Suction available and Patient being monitored Patient Re-evaluated:Patient Re-evaluated prior to induction Oxygen Delivery Method: Circle system utilized Preoxygenation: Pre-oxygenation with 100% oxygen Induction Type: IV induction Ventilation: Mask ventilation without difficulty Laryngoscope Size: Mac and 4 Grade View: Grade I Tube type: Oral Number of attempts: 1 Airway Equipment and Method: Stylet Placement Confirmation: ETT inserted through vocal cords under direct vision,  positive ETCO2 and breath sounds checked- equal and bilateral Secured at: 21 cm Tube secured with: Tape Dental Injury: Teeth and Oropharynx as per pre-operative assessment

## 2019-11-28 NOTE — H&P (Signed)
Cheryl Owens is an 54 y.o. female. [id number] Single  RP: XI Robotic TLH/BSO for Symptomatic large Fibroids  HPI:No change x last visit.  On Megace now with no current vaginal bleeding.  DepoLupron 11/2017 and 03/2018.  Took Cyclic Provera.  Withdrawal bleeding heavy. Feeling of fullness, discomfort.  Wants to proceed with Hysterectomy. Abstinent. Urine/BMs normal. Breasts normal. BMI 39.38. Health labs with Fam MD.   Pertinent Gynecological History: Menses: Menometro Blood transfusions: none Last mammogram: normal  Last pap: normal    Menstrual History: No LMP recorded. (Menstrual status: Other).    Past Medical History:  Diagnosis Date  . Anxiety   . Cellulitis    diagnosed Sept.7 2021, left upper  posterior arm,  right upper posterior arm  . Depression   . Dry mouth   . Fibroids   . Gallbladder disease   . High cholesterol   . HTN (hypertension) 2007   seen in ER  with headaches  . Hypertension   . Hypokalemia 2007   due to HCTZ  . Nervousness   . Obesity   . Pre-diabetes   . Stress   . Swelling of lower extremity   . Trouble in sleeping     Past Surgical History:  Procedure Laterality Date  . arm surgery     post trauma @ age 2  . BREAST SURGERY Right 2016   BREAST BX   . CHOLECYSTECTOMY     gall stones  . GANGLION CYST EXCISION N/A    wrist  . WISDOM TOOTH EXTRACTION      Family History  Problem Relation Age of Onset  . Cancer Maternal Grandmother        thyroid  . Hypertension Maternal Grandmother   . Hyperlipidemia Mother   . Hypertension Mother   . Depression Mother   . Anxiety disorder Mother   . Obesity Mother   . Breast cancer Maternal Aunt 51  . Kidney disease Maternal Aunt         X 2; 1 on Dialysis  . Cancer Maternal Aunt        ? primary; also had HTN  . Prostate cancer Paternal Uncle          3 uncles  . Prostate cancer Father   . Cancer Father   . Hypertension Paternal Grandmother   . Hypertension Maternal Aunt        X  2  . Bipolar disorder Paternal Aunt   . Stroke Neg Hx   . Diabetes Neg Hx   . Heart disease Neg Hx   . Colon cancer Neg Hx     Social History:  reports that she has never smoked. She has never used smokeless tobacco. She reports that she does not drink alcohol and does not use drugs.  Allergies:  Allergies  Allergen Reactions  . Hydrochlorothiazide     REACTION: hypokalemia  . Benazepril Nausea And Vomiting    abd cramps    Medications Prior to Admission  Medication Sig Dispense Refill Last Dose  . atorvastatin (LIPITOR) 20 MG tablet Take 1 tablet (20 mg total) by mouth daily. 90 tablet 3 11/27/2019 at Unknown time  . buPROPion (WELLBUTRIN XL) 300 MG 24 hr tablet TAKE 1 TABLET EVERY MORNING (Patient taking differently: Take 300 mg by mouth daily. ) 90 tablet 3 11/28/2019 at 0530  . clonazePAM (KLONOPIN) 0.5 MG tablet TAKE ONE-HALF (1/2) TABLET IN THE MORNING AND 2 TABLETS AT BEDTIME (Patient taking differently: Take 0.25-0.5 mg by  mouth See admin instructions. Take 0.25 mg by mouth in the morning and 0.5 mg at bedtime) 225 tablet 0 11/28/2019 at 0530  . doxycycline (VIBRA-TABS) 100 MG tablet Take 1 tablet (100 mg total) by mouth 2 (two) times daily. 20 tablet 0 11/28/2019 at 0530  . escitalopram (LEXAPRO) 10 MG tablet TAKE 1 TABLET DAILY (Patient taking differently: Take 10 mg by mouth daily. ) 90 tablet 0 11/28/2019 at 0530  . labetalol (NORMODYNE) 100 MG tablet Take 1 tablet (100 mg total) by mouth 2 (two) times daily. 180 tablet 1 11/27/2019 at 2200  . megestrol (MEGACE) 40 MG tablet Take one tablet po bid until surgery day. (Patient taking differently: Take 40 mg by mouth 2 (two) times daily. ) 65 tablet 0 11/27/2019 at Unknown time  . predniSONE (DELTASONE) 10 MG tablet TAKE 3 TABLETS PO QD FOR 3 DAYS THEN TAKE 2 TABLETS PO QD FOR 3 DAYS THEN TAKE 1 TABLET PO QD FOR 3 DAYS THEN TAKE 1/2 TAB PO QD FOR 3 DAYS 20 tablet 0 11/28/2019 at 0738  . PREDNISONE PO Take by mouth daily. DOSE PAK     .  spironolactone (ALDACTONE) 25 MG tablet Take 1 tablet (25 mg total) by mouth 2 (two) times daily. 180 tablet 3 11/27/2019 at Unknown time  . Vitamin D, Ergocalciferol, (DRISDOL) 1.25 MG (50000 UNIT) CAPS capsule Take 1 capsule (50,000 Units total) by mouth every 7 (seven) days. 12 capsule 1 11/27/2019 at Unknown time  . triamcinolone ointment (KENALOG) 0.1 % Apply 1 application topically 2 (two) times daily. For rash on arms only. 30 g 0 More than a month at Unknown time    REVIEW OF SYSTEMS: A ROS was performed and pertinent positives and negatives are included in the history.  GENERAL: No fevers or chills. HEENT: No change in vision, no earache, sore throat or sinus congestion. NECK: No pain or stiffness. CARDIOVASCULAR: No chest pain or pressure. No palpitations. PULMONARY: No shortness of breath, cough or wheeze. GASTROINTESTINAL: No abdominal pain, nausea, vomiting or diarrhea, melena or bright red blood per rectum. GENITOURINARY: No urinary frequency, urgency, hesitancy or dysuria. MUSCULOSKELETAL: No joint or muscle pain, no back pain, no recent trauma. DERMATOLOGIC: No rash, no itching, no lesions. ENDOCRINE: No polyuria, polydipsia, no heat or cold intolerance. No recent change in weight. HEMATOLOGICAL: No anemia or easy bruising or bleeding. NEUROLOGIC: No headache, seizures, numbness, tingling or weakness. PSYCHIATRIC: No depression, no loss of interest in normal activity or change in sleep pattern.     Blood pressure (!) 142/90, pulse 77, temperature 98.8 F (37.1 C), temperature source Oral, resp. rate 18, height 5\' 7"  (1.702 m), weight 111.4 kg, SpO2 96 %.  Physical Exam:  See office notes   Results for orders placed or performed during the hospital encounter of 11/28/19 (from the past 24 hour(s))  Pregnancy, urine POC     Status: None   Collection Time: 11/28/19  6:52 AM  Result Value Ref Range   Preg Test, Ur NEGATIVE NEGATIVE   Covid Negative  Pelvic US 08/11/2019:  Comparison  is made with previous scan January 2021. Transabdominal images. Grossly enlarged uterus with multiple large fibroids with no significant change in size of the uterus or fibroids since the previous scan. The uterus is measured at 15.18 x 13.98 x 8.86 cm. 3 fibroids are measured in the 5 cm range. Small amount of fluid in the endometrial cavity outlining a very irregular cavity with several adjacent fibroids protruding into it.  Thin appearing walls. 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 Right ovary again not identified due to the pedunculated fibroids into the right adnexal area. No free fluid in the posterior cul-de-sac.   Assessment/Plan:  54 y.o. female   Fibroids Multiple symptomatic Fibroids with discomfort and Menometrorrhagia.  Decision to proceed with an XI Robotic TLH/BSO.  Surgery/Risks/Benefits reviewed.  Patient understand that we may have to convert to an open surgery depending on the findings at the time of surgery.  Information and pamphlet given.  Menometrorrhagia As above.                        Patient was counseled as to the risk of surgery to include the following:  1. Infection (prohylactic antibiotics will be administered)  2. DVT/Pulmonary Embolism (prophylactic pneumo compression stockings will be used)  3.Trauma to internal organs requiring additional surgical procedure to repair any injury to internal organs requiring perhaps additional hospitalization days.  4.Hemmorhage requiring transfusion and blood products which carry risks such as anaphylactic reaction, hepatitis and AIDS  Patient had received literature information on the procedure scheduled and all her questions were answered and fully accepts all risk.   Marie-Lyne Jayd Forrey 11/28/2019, 8:23 AM

## 2019-11-28 NOTE — Discharge Instructions (Signed)

## 2019-11-28 NOTE — Anesthesia Postprocedure Evaluation (Signed)
Anesthesia Post Note  Patient: Cheryl Owens  Procedure(s) Performed: XI ROBOTIC ASSISTED TOTAL LAPAROSCOPIC HYSTERECTOMY WITH BILATERAL SALPINGO OOPHORECTOMY (Bilateral )     Patient location during evaluation: PACU Anesthesia Type: General Level of consciousness: awake and alert Pain management: pain level controlled Vital Signs Assessment: post-procedure vital signs reviewed and stable Respiratory status: spontaneous breathing, nonlabored ventilation, respiratory function stable and patient connected to nasal cannula oxygen Cardiovascular status: blood pressure returned to baseline and stable Postop Assessment: no apparent nausea or vomiting Anesthetic complications: no   No complications documented.  Last Vitals:  Vitals:   11/28/19 1245 11/28/19 1300  BP: (!) 149/82 (!) 141/91  Pulse: 73 75  Resp: 13 12  Temp:    SpO2: 98% 98%    Last Pain:  Vitals:   11/28/19 1245  TempSrc:   PainSc: 0-No pain                 Merlinda Frederick

## 2019-11-28 NOTE — Op Note (Signed)
Operative Note  11/28/2019  12:31 PM  PATIENT:  Cheryl Owens  54 y.o. female  PRE-OPERATIVE DIAGNOSIS:  Large symptomatic fibroids  POST-OPERATIVE DIAGNOSIS:  Large symptomatic fibroids  PROCEDURE:  Procedure(s): XI ROBOTIC ASSISTED TOTAL LAPAROSCOPIC HYSTERECTOMY WITH BILATERAL SALPINGO OOPHORECTOMY  SURGEON:  Surgeon(s): Princess Bruins, MD Joseph Pierini, MD  ANESTHESIA:   general  FINDINGS: Large uterine fibroids, normal bilateral tubes and bilateral ovaries.  DESCRIPTION OF OPERATION: Under general anesthesia with endotracheal intubation, the patient is in lithotomy position.  She is prepped with DuraPrep on the abdomen and with Betadine on the supra pubic, vulvar and vaginal areas.  She is draped as usual.  Timeout is done.  Patient received a dose of Ancef 2 g IV and a PAS were put on.  The Foley is inserted in the bladder.  The bimanual exam reveals a nodular anteverted uterus about 15 to 16 cm.  A weighted speculum is inserted in the vagina and the anterior lip of the cervix is grasped with a tenaculum.  The hysterometry is at 13 cm.  A #10 Roomi with the medium Koh ring are put in place.  The weighted speculum in the tenaculum are removed.  We go to the abdomen.      The supraumbilical area is infiltrated with Marcaine one quarter plain.  A 1.5 cm incision is done with the scalpel.  The aponeurosis is grasped with cokers.  Incision of the aponeurosis with Mayo scissors under direct vision.  The parietal peritoneum is grasped with hemostats.  The parietal peritoneum is open under direct vision with Mayo scissors.  A pursestring stitch of Vicryl 0 is done on the aponeurosis.  The Hossein is inserted under direct vision and up pneumoperitoneum is created with CO2.  Inspection of the abdominopelvic cavities.  We note a very large uterus with multiple fibroids.  Both ovaries and tubes are normal in size and appearance.  The abdominal wall is free at the sites where the ports will be  entered.  We infiltrated the skin with Marcaine one quarter plain at each port site and small incisions are made with a scalpel.  All incisions are in line with the umbilicus.  Insertion of 3 robotic ports under direct vision at the 2 right sites and at the distal left site.  The assistant ports a 5 mm port is inserted under direct vision at the medial left side.  The patient is put in deep Trendelenburg of 30 degrees.  She tolerates it well.  The robot is docked.  Targeting is done.  The robotic instruments are put in place under direct vision.  With the fenestrated clamp in the fourth arm, the EndoShears scissors in the third arm and the PK bipolar in the first arm.  Both ureters are seen in normal anatomic position with good peristalsis.  We start on the left side with cauterization and section of the left infundibulopelvic ligament.  We follow closely under the left ovary with cauterization and section.  We then cauterized and sectioned the left round ligament.  The anterior visceral peritoneum was opened from the left side to the midline.  We proceeded exactly the same way on the right side except that the robotic tenaculum was needed to grasp a fundal right fibroid in order to improve visualization.  The right infundibulopelvic ligament was cauterized and sectioned.  We followed just under the right ovary cauterizing and sectioning.  We then cauterized and sectioned the right round ligament.  The visceral peritoneum  was opened from the right to the anterior midline.  We were then able to lower the bladder past the [facility] ring.  We then cauterize the right uterine artery.  We cauterized and sectioned the left uterine artery.  We came back on the right side cauterized more and sectioned the right uterine artery.  The vaginal occluder was filled.  We then opened the superior aspect of the vagina on top of the [facility] ring anteriorly, on the right side and the left side, and finished posteriorly.  The uterus with the  cervix and both tubes and both ovaries were completely detached.  Hemostasis was adequate at all levels.  The specimen was too large to be passed intact vaginally.  We therefore used the EndoShears scissors and cutting mode to remove the fibroids that were making the specimen to wide.  3 large pieces with fibroids were detached from the main body of the uterus.  The uterus with cervix was passed vaginally.  The 3 pieces including both ovaries and both tubes were then passed vaginally 1 at a time.  All specimens were sent together to pathology.  The vaginal occluder was put back in place.  Hemostasis was adequate at all levels.  The bladder was further descended to allow for safe closure of the vaginal vault.  A V-Loc 0 at 9 inches was inserted in the abdominopelvic cavity.  The instruments were changed to the cutting needle driver in the third arm, the long tip in the first arm and the fenestrated clamp in the fourth arm.  We started at the right angle of the vaginal vault and did a running suture all the way to the left angle and then back all the way to the right.  That provided a very good closure with good hemostasis at all levels.  The needle was removed from the abdominopelvic cavity.  Both ureters were seen with good peristalsis.  The urine was clear.  The robotic instruments were removed.  The robot was undocked.  We went to laparoscopy time.  We irrigated and suction the pelvic cavity after removing the Trendelenburg.  Good hemostasis was confirmed once more.  We removed all laparoscopic instruments.  The ports were removed under direct vision.  The CO2 was evacuated.  The pursestring stitch was attached at the supraumbilical incision.  A figure-of-eight was put on the adipose tissue at the supraumbilical incision with a Monocryl 4-0.  All incisions were closed with a subcuticular suture of Monocryl 4-0.  Dermabond was added on all incisions.  Hemostasis was adequate at all incisions.  The occluder was  removed from the vagina.  The patient was brought to recovery room in good and stable status.  ESTIMATED BLOOD LOSS: 150 mL   Intake/Output Summary (Last 24 hours) at 11/28/2019 1231 Last data filed at 11/28/2019 1224 Gross per 24 hour  Intake 1500 ml  Output 300 ml  Net 1200 ml     BLOOD ADMINISTERED:none   LOCAL MEDICATIONS USED:  MARCAINE     SPECIMEN:  Source of Specimen:  Uterus with cervix/fibroids, bilateral tubes and ovaries  DISPOSITION OF SPECIMEN:  PATHOLOGY  COUNTS:  YES  PLAN OF CARE: Transfer to PACU  Marie-Lyne LavoieMD12:31 PM

## 2019-11-28 NOTE — Transfer of Care (Signed)
Immediate Anesthesia Transfer of Care Note  Patient: Cheryl Owens  Procedure(s) Performed: XI ROBOTIC ASSISTED TOTAL LAPAROSCOPIC HYSTERECTOMY WITH BILATERAL SALPINGO OOPHORECTOMY (Bilateral )  Patient Location: PACU  Anesthesia Type:General  Level of Consciousness: awake, alert  and oriented  Airway & Oxygen Therapy: Patient Spontanous Breathing and Patient connected to nasal cannula oxygen  Post-op Assessment: Report given to RN and Post -op Vital signs reviewed and stable  Post vital signs: Reviewed and stable  Last Vitals:  Vitals Value Taken Time  BP 158/79 11/28/19 1222  Temp    Pulse 77 11/28/19 1223  Resp 20 11/28/19 1223  SpO2 100 % 11/28/19 1223  Vitals shown include unvalidated device data.  Last Pain:  Vitals:   11/28/19 0725  TempSrc: Oral  PainSc: 0-No pain      Patients Stated Pain Goal: 5 (60/63/01 6010)  Complications: No complications documented.

## 2019-11-29 DIAGNOSIS — E669 Obesity, unspecified: Secondary | ICD-10-CM | POA: Diagnosis not present

## 2019-11-29 DIAGNOSIS — N840 Polyp of corpus uteri: Secondary | ICD-10-CM | POA: Diagnosis not present

## 2019-11-29 DIAGNOSIS — F419 Anxiety disorder, unspecified: Secondary | ICD-10-CM | POA: Diagnosis not present

## 2019-11-29 DIAGNOSIS — F329 Major depressive disorder, single episode, unspecified: Secondary | ICD-10-CM | POA: Diagnosis not present

## 2019-11-29 DIAGNOSIS — Z6839 Body mass index (BMI) 39.0-39.9, adult: Secondary | ICD-10-CM | POA: Diagnosis not present

## 2019-11-29 DIAGNOSIS — I10 Essential (primary) hypertension: Secondary | ICD-10-CM | POA: Diagnosis not present

## 2019-11-29 DIAGNOSIS — N888 Other specified noninflammatory disorders of cervix uteri: Secondary | ICD-10-CM | POA: Diagnosis not present

## 2019-11-29 DIAGNOSIS — Z79899 Other long term (current) drug therapy: Secondary | ICD-10-CM | POA: Diagnosis not present

## 2019-11-29 DIAGNOSIS — N921 Excessive and frequent menstruation with irregular cycle: Secondary | ICD-10-CM | POA: Diagnosis not present

## 2019-11-29 DIAGNOSIS — E7800 Pure hypercholesterolemia, unspecified: Secondary | ICD-10-CM | POA: Diagnosis not present

## 2019-11-29 DIAGNOSIS — D259 Leiomyoma of uterus, unspecified: Secondary | ICD-10-CM | POA: Diagnosis not present

## 2019-11-29 DIAGNOSIS — N838 Other noninflammatory disorders of ovary, fallopian tube and broad ligament: Secondary | ICD-10-CM | POA: Diagnosis not present

## 2019-11-29 MED ORDER — HYDROCODONE-ACETAMINOPHEN 5-325 MG PO TABS
ORAL_TABLET | ORAL | Status: AC
  Filled 2019-11-29: qty 1

## 2019-11-29 NOTE — Progress Notes (Signed)
POD#1 XI Robotic TLH/Bilateral Salpingectomy  Subjective: Patient reports tolerating PO, + flatus and no problems voiding.    Objective: I have reviewed patient's vital signs.  vital signs, intake and output, medications and labs.  Vitals:   11/29/19 0237 11/29/19 0550  BP: 132/68 131/70  Pulse: 72 75  Resp: 18 18  Temp: 98.1 F (36.7 C) 98 F (36.7 C)  SpO2: 98% 100%   I/O last 3 completed shifts: In: 2480 [P.O.:480; I.V.:2000] Out: 3250 [Urine:3100; Blood:150] No intake/output data recorded.  Results for orders placed or performed during the hospital encounter of 11/28/19 (from the past 24 hour(s))  CBC     Status: Abnormal   Collection Time: 11/28/19  2:03 PM  Result Value Ref Range   WBC 19.8 (H) 4.0 - 10.5 K/uL   RBC 4.08 3.87 - 5.11 MIL/uL   Hemoglobin 11.5 (L) 12.0 - 15.0 g/dL   HCT 35.8 (L) 36 - 46 %   MCV 87.7 80.0 - 100.0 fL   MCH 28.2 26.0 - 34.0 pg   MCHC 32.1 30.0 - 36.0 g/dL   RDW 14.5 11.5 - 15.5 %   Platelets 275 150 - 400 K/uL   nRBC 0.0 0.0 - 0.2 %    EXAM General: alert and cooperative Resp: clear to auscultation bilaterally Cardio: regular rate and rhythm GI: soft, non-tender; bowel sounds normal; no masses,  no organomegaly and incision: clean, dry and intact Extremities: no edema, redness or tenderness in the calves or thighs Vaginal Bleeding: none  PO Hb 11.5   Assessment: s/p Procedure(s): XI ROBOTIC ASSISTED TOTAL LAPAROSCOPIC HYSTERECTOMY WITH BILATERAL SALPINGO OOPHORECTOMY: stable, progressing well and tolerating diet  Plan: Discharge home  LOS: 0 days    Cheryl Bruins, MD 11/29/2019 8:18 AM

## 2019-11-29 NOTE — Addendum Note (Signed)
Addendum  created 11/29/19 1942 by Duane Boston, MD   Intraprocedure Staff edited

## 2019-11-29 NOTE — Discharge Summary (Signed)
Physician Discharge Summary  Patient ID: Cheryl Owens MRN: [id number] DOB/AGE: April 07, 1965 54 y.o.  Admit date: 11/28/2019 Discharge date: 11/29/2019  Admission Diagnoses: Postoperative state [Z98.890] Fibroids/Menometrorrhagia  Discharge Diagnoses: Same Active Problems:   Postoperative state   Discharged Condition: good  Consults:None  Significant Diagnostic Studies: labs: Hb PO 11.5  Treatments:surgery: XI Robotic Total Laparoscopic Hysterectomy with Bilateral Salpingectomies  Vitals:   11/29/19 0237 11/29/19 0550  BP: 132/68 131/70  Pulse: 72 75  Resp: 18 18  Temp: 98.1 F (36.7 C) 98 F (36.7 C)  SpO2: 98% 100%     No intake/output data recorded.   Hospital Course: Good  Discharge Exam: Normal PO  Disposition: D/C Home     Allergies as of 11/29/2019      Reactions   Hydrochlorothiazide    REACTION: hypokalemia   Benazepril Nausea And Vomiting   abd cramps      Medication List    STOP taking these medications   megestrol 40 MG tablet Commonly known as: MEGACE     TAKE these medications   atorvastatin 20 MG tablet Commonly known as: LIPITOR Take 1 tablet (20 mg total) by mouth daily.   buPROPion 300 MG 24 hr tablet Commonly known as: WELLBUTRIN XL TAKE 1 TABLET EVERY MORNING What changed: when to take this   clonazePAM 0.5 MG tablet Commonly known as: KLONOPIN TAKE ONE-HALF (1/2) TABLET IN THE MORNING AND 2 TABLETS AT BEDTIME What changed:   how much to take  how to take this  when to take this  additional instructions   doxycycline 100 MG tablet Commonly known as: VIBRA-TABS Take 1 tablet (100 mg total) by mouth 2 (two) times daily.   escitalopram 10 MG tablet Commonly known as: LEXAPRO TAKE 1 TABLET DAILY   labetalol 100 MG tablet Commonly known as: NORMODYNE Take 1 tablet (100 mg total) by mouth 2 (two) times daily.   oxyCODONE-acetaminophen 7.5-325 MG tablet Commonly known as: Percocet Take 1 tablet by mouth every 6  (six) hours as needed for severe pain.   PREDNISONE PO Take by mouth daily. DOSE PAK   predniSONE 10 MG tablet Commonly known as: DELTASONE TAKE 3 TABLETS PO QD FOR 3 DAYS THEN TAKE 2 TABLETS PO QD FOR 3 DAYS THEN TAKE 1 TABLET PO QD FOR 3 DAYS THEN TAKE 1/2 TAB PO QD FOR 3 DAYS   spironolactone 25 MG tablet Commonly known as: ALDACTONE Take 1 tablet (25 mg total) by mouth 2 (two) times daily.   triamcinolone ointment 0.1 % Commonly known as: KENALOG Apply 1 application topically 2 (two) times daily. For rash on arms only.   Vitamin D (Ergocalciferol) 1.25 MG (50000 UNIT) Caps capsule Commonly known as: DRISDOL Take 1 capsule (50,000 Units total) by mouth every 7 (seven) days.         Follow-up Information    Princess Bruins, MD In 3 weeks.   Specialty: Obstetrics and Gynecology Contact information: [address] [place] [address] [phone]                Signed: Princess Bruins 11/29/2019, 8:22 AM

## 2019-11-30 ENCOUNTER — Telehealth: Payer: Self-pay | Admitting: Family Medicine

## 2019-11-30 ENCOUNTER — Encounter (HOSPITAL_BASED_OUTPATIENT_CLINIC_OR_DEPARTMENT_OTHER): Payer: Self-pay | Admitting: Obstetrics & Gynecology

## 2019-11-30 LAB — SURGICAL PATHOLOGY

## 2019-11-30 NOTE — Telephone Encounter (Signed)
Pt had [place] [employer] up front in bin and had not picked it up yet.  I mailed to home address

## 2019-12-01 NOTE — Telephone Encounter (Signed)
Yes this can be normal at this point Drink plenty of water Would do colace 100 mg bid If still no BM, Try miralax 1-2 x per day If severe bloating/not passing gas/and/or vomiting, needs to be evaluated

## 2019-12-01 NOTE — Telephone Encounter (Signed)
Dr. Mariah Milling patient.

## 2019-12-05 DIAGNOSIS — M25561 Pain in right knee: Secondary | ICD-10-CM | POA: Diagnosis not present

## 2019-12-05 DIAGNOSIS — M19041 Primary osteoarthritis, right hand: Secondary | ICD-10-CM | POA: Diagnosis not present

## 2019-12-05 DIAGNOSIS — M79641 Pain in right hand: Secondary | ICD-10-CM | POA: Diagnosis not present

## 2019-12-05 DIAGNOSIS — R21 Rash and other nonspecific skin eruption: Secondary | ICD-10-CM | POA: Diagnosis not present

## 2019-12-05 DIAGNOSIS — M1712 Unilateral primary osteoarthritis, left knee: Secondary | ICD-10-CM | POA: Diagnosis not present

## 2019-12-05 DIAGNOSIS — M2550 Pain in unspecified joint: Secondary | ICD-10-CM | POA: Diagnosis not present

## 2019-12-05 DIAGNOSIS — M79642 Pain in left hand: Secondary | ICD-10-CM | POA: Diagnosis not present

## 2019-12-05 DIAGNOSIS — R768 Other specified abnormal immunological findings in serum: Secondary | ICD-10-CM | POA: Diagnosis not present

## 2019-12-05 DIAGNOSIS — M359 Systemic involvement of connective tissue, unspecified: Secondary | ICD-10-CM | POA: Diagnosis not present

## 2019-12-05 DIAGNOSIS — M25562 Pain in left knee: Secondary | ICD-10-CM | POA: Diagnosis not present

## 2019-12-05 DIAGNOSIS — M19042 Primary osteoarthritis, left hand: Secondary | ICD-10-CM | POA: Diagnosis not present

## 2019-12-05 DIAGNOSIS — M1811 Unilateral primary osteoarthritis of first carpometacarpal joint, right hand: Secondary | ICD-10-CM | POA: Diagnosis not present

## 2019-12-05 DIAGNOSIS — M1812 Unilateral primary osteoarthritis of first carpometacarpal joint, left hand: Secondary | ICD-10-CM | POA: Diagnosis not present

## 2019-12-05 DIAGNOSIS — M1711 Unilateral primary osteoarthritis, right knee: Secondary | ICD-10-CM | POA: Diagnosis not present

## 2019-12-07 ENCOUNTER — Encounter: Payer: Self-pay | Admitting: Family Medicine

## 2019-12-07 NOTE — Telephone Encounter (Signed)
Okay to give Tdap?

## 2019-12-08 NOTE — Telephone Encounter (Signed)
Ok to give Tdap

## 2019-12-22 ENCOUNTER — Encounter: Payer: Self-pay | Admitting: Obstetrics & Gynecology

## 2019-12-22 ENCOUNTER — Other Ambulatory Visit: Payer: Self-pay

## 2019-12-22 ENCOUNTER — Ambulatory Visit (INDEPENDENT_AMBULATORY_CARE_PROVIDER_SITE_OTHER): Payer: BC Managed Care – PPO | Admitting: Obstetrics & Gynecology

## 2019-12-22 VITALS — BP 110/70

## 2019-12-22 DIAGNOSIS — Z09 Encounter for follow-up examination after completed treatment for conditions other than malignant neoplasm: Secondary | ICD-10-CM

## 2019-12-22 NOTE — Progress Notes (Signed)
    Cheryl Owens 1965/05/27 [id number]        54 y.o.  G4P0031   RP: Postop XI Robotic TLH/BSO 11/28/2019  HPI: Excellent postop healing.  No abdominopelvic pain.  No vaginal bleeding.  No vaginal discharge.  No fever.  Urine/BMs normal.   OB History  Gravida Para Term Preterm AB Living  4 1     3 1   SAB TAB Ectopic Multiple Live Births               # Outcome Date GA Lbr Len/2nd Weight Sex Delivery Anes PTL Lv  4 AB           3 AB           2 AB           1 Para             Past medical history,surgical history, problem list, medications, allergies, family history and social history were all reviewed and documented in the EPIC chart.   Directed ROS with pertinent positives and negatives documented in the history of present illness/assessment and plan.  Exam:  Vitals:   12/22/19 1435  BP: 110/70   General appearance:  Normal  Abdomen: Incisions well healed  Gynecologic exam: Vulva normal.  Speculum:  Vaginal vault well closed.  No bleeding, no discharge.  Patho: FINAL MICROSCOPIC DIAGNOSIS:   A. UTERUS AND BILATERAL FALLOPIAN TUBES AND OVARIES, HYSTERECTOMY WITH  BILATERAL SALPINGO-OOPHORECTOMY:   Uterus:  - Benign endometrial polyp  - Disordered proliferative endometrium  - Leiomyomata with calcifications and hyalinization (4.0 cm; largest)   Cervix:  - Benign cervix with nabothian cyst  - No malignancy identified   Ovaries, bilateral:  - Benign ovaries  - No malignancy identified   Fallopian tubes, bilateral:  - Benign fallopian tube with paratubal cyst (0.6 cm; left)  - No malignancy identified    Assessment/Plan:  54 y.o. [id number]   1. Status post gynecological surgery, follow-up exam Good postop healing without complication.  Pathology benign, reviewed with patient.  We will follow-up in 4 weeks to reassess the vaginal vault.  Not planning to go back to work.  Princess Bruins MD, 2:44 PM 12/22/2019

## 2019-12-26 ENCOUNTER — Telehealth: Payer: Self-pay

## 2019-12-26 NOTE — Telephone Encounter (Signed)
Patient called because disability company was under impression she would return to work today at 4 weeks post op.  She is planning to be out 6 weeks.   #1 Is that okay for me to give 6 weeks out of work to [place]?   #2  That will be 01/09/20 patient not scheduled to see you again until 01/16/2020/ Should she schedule that before return to work?

## 2019-12-26 NOTE — Telephone Encounter (Signed)
6 weeks out of work is ok.  Ok to return to work before the next appointment on 01/16/2020.

## 2019-12-27 NOTE — Telephone Encounter (Signed)
Patient informed. Forms to be faxed to [facility].

## 2020-01-09 ENCOUNTER — Encounter: Payer: Self-pay | Admitting: Anesthesiology

## 2020-01-10 ENCOUNTER — Other Ambulatory Visit: Payer: Self-pay | Admitting: Psychiatry

## 2020-01-10 DIAGNOSIS — F320 Major depressive disorder, single episode, mild: Secondary | ICD-10-CM

## 2020-01-11 NOTE — Telephone Encounter (Signed)
Last apt 02/2019 due at 6 months

## 2020-01-11 NOTE — Telephone Encounter (Signed)
Call to RS

## 2020-01-12 ENCOUNTER — Telehealth: Payer: Self-pay | Admitting: Psychiatry

## 2020-01-12 NOTE — Telephone Encounter (Signed)
Left a message for patient to call back and schedule.

## 2020-01-12 NOTE — Telephone Encounter (Signed)
Cheryl Owens called to make appt.  She didn't realize it had been so long.  She is scheduled for 11/29.  Please approve her prescription for bupropion to [facility].

## 2020-01-12 NOTE — Telephone Encounter (Signed)
Rx has already been sent to [facility]

## 2020-01-18 ENCOUNTER — Other Ambulatory Visit: Payer: Self-pay

## 2020-01-18 ENCOUNTER — Ambulatory Visit (INDEPENDENT_AMBULATORY_CARE_PROVIDER_SITE_OTHER): Payer: BC Managed Care – PPO | Admitting: Obstetrics & Gynecology

## 2020-01-18 ENCOUNTER — Encounter: Payer: Self-pay | Admitting: Obstetrics & Gynecology

## 2020-01-18 VITALS — BP 120/80

## 2020-01-18 DIAGNOSIS — Z09 Encounter for follow-up examination after completed treatment for conditions other than malignant neoplasm: Secondary | ICD-10-CM

## 2020-01-18 DIAGNOSIS — N393 Stress incontinence (female) (male): Secondary | ICD-10-CM | POA: Diagnosis not present

## 2020-01-18 NOTE — Progress Notes (Signed)
    LILLIAHNA SCHUBRING November 27, 1965 [id number]        54 y.o.  G4P0031   RP: Post op XI Robotic TLH/BSO 11/28/2019  HPI: Excellent postop healing.  No abdominopelvic pain.  No vaginal bleeding.  No vaginal discharge.  No fever.  Urine/BMs normal.  C/O SUI with coughing and sneezing.   OB History  Gravida Para Term Preterm AB Living  4 1     3 1   SAB TAB Ectopic Multiple Live Births               # Outcome Date GA Lbr Len/2nd Weight Sex Delivery Anes PTL Lv  4 AB           3 AB           2 AB           1 Para             Past medical history,surgical history, problem list, medications, allergies, family history and social history were all reviewed and documented in the EPIC chart.   Directed ROS with pertinent positives and negatives documented in the history of present illness/assessment and plan.  Exam:  Vitals:   01/18/20 1538  BP: 120/80   General appearance:  Normal  Abdomen: Normal.  Incisions well healed.  Gynecologic exam: Vulva normal.  Speculum:  Vaginal vault well closed.  Normal secretions, no bleeding.  Bimanual exam:  Vaginal vault NT, no mass felt in the pelvis.   Assessment/Plan:  54 y.o. G4P0031   1. Status post gynecological surgery, follow-up exam Excellent healing post XI robotic TLH/BSO.  No complication.  Vaginal vault well-healed.  Can resume all physical activities and sexual activities.  We will follow-up for annual gynecologic exam August 2022.  2. SUI (stress urinary incontinence, female) Counseling on stress urinary incontinence done.  Recommend maximizing Kegel exercises.  Treating reasons for sneezing or coughing.  Emptying the bladder regularly to avoid overfilling.  F all that is not sufficient, refer to physical therapy for pelvic floor reinforcement.  Princess Bruins MD, 3:42 PM 01/18/2020

## 2020-01-24 DIAGNOSIS — R768 Other specified abnormal immunological findings in serum: Secondary | ICD-10-CM | POA: Diagnosis not present

## 2020-01-24 DIAGNOSIS — M359 Systemic involvement of connective tissue, unspecified: Secondary | ICD-10-CM | POA: Diagnosis not present

## 2020-01-24 DIAGNOSIS — R21 Rash and other nonspecific skin eruption: Secondary | ICD-10-CM | POA: Diagnosis not present

## 2020-01-24 DIAGNOSIS — M2550 Pain in unspecified joint: Secondary | ICD-10-CM | POA: Diagnosis not present

## 2020-01-25 DIAGNOSIS — F4322 Adjustment disorder with anxiety: Secondary | ICD-10-CM | POA: Diagnosis not present

## 2020-02-06 ENCOUNTER — Other Ambulatory Visit: Payer: Self-pay

## 2020-02-06 ENCOUNTER — Ambulatory Visit (INDEPENDENT_AMBULATORY_CARE_PROVIDER_SITE_OTHER): Payer: BC Managed Care – PPO | Admitting: Psychiatry

## 2020-02-06 ENCOUNTER — Encounter: Payer: Self-pay | Admitting: Psychiatry

## 2020-02-06 DIAGNOSIS — F411 Generalized anxiety disorder: Secondary | ICD-10-CM | POA: Diagnosis not present

## 2020-02-06 DIAGNOSIS — F3342 Major depressive disorder, recurrent, in full remission: Secondary | ICD-10-CM | POA: Diagnosis not present

## 2020-02-06 DIAGNOSIS — F4001 Agoraphobia with panic disorder: Secondary | ICD-10-CM | POA: Diagnosis not present

## 2020-02-06 DIAGNOSIS — F5105 Insomnia due to other mental disorder: Secondary | ICD-10-CM | POA: Diagnosis not present

## 2020-02-06 MED ORDER — BUPROPION HCL ER (XL) 300 MG PO TB24: 300.0000 mg | ORAL_TABLET | Freq: Every morning | ORAL | 1 refills | Status: DC

## 2020-02-06 MED ORDER — CLONAZEPAM 0.5 MG PO TABS: ORAL_TABLET | ORAL | 1 refills | Status: DC

## 2020-02-06 MED ORDER — ESCITALOPRAM OXALATE 10 MG PO TABS: 10.0000 mg | ORAL_TABLET | Freq: Every day | ORAL | 1 refills | Status: DC

## 2020-02-06 NOTE — Progress Notes (Signed)
Cheryl Owens [id number] [birth year] 54 y.o.  Subjective:   Patient ID:  Cheryl Owens is a 54 y.o. (DOB 1965-05-23) female.  Chief Complaint:  Chief Complaint  Patient presents with  . Follow-up  . Anxiety  . Depression  . Sleeping Problem    Depression        Associated symptoms include no decreased concentration and no suicidal ideas.  Cheryl Owens presents to the office today for follow-up of depression and anxiety and history of insomnia and Bz withdrawal.    seen in June and December 2020.  No meds were changed.  She remained on Wellbutrin XL 300 mg, Lexapro 10 mg, vitamin D, and clonazepam 0.5 mg twice daily as needed.  Usually takes clonazepam 0.5 mg 1/2 AM and 1 in PM.  02/06/2020 appointment with the following noted: Still doing well overall.  Hasn't tried to reduce.  Don't want to ever go back to feeling like she did with the episode. Laid off and another person also.  Had 33 years with the company.  Got 6 mos severance pay.  Needs another job but not desperate over it.  Made money with real estate sales.  Hysterectomy went well. Overall feels ok about things but doesn't want to stop meds.  No panic. Recognizes some social anxiety.    Better with weight control.  Doing Healthy Weight and Wellness including doctor including mental health provider and counseling to help weight loss.    Work life balance is better overall.  Patient reports stable mood and denies depressed or irritable moods.  Patient denies any recent difficulty with anxiety.  Patient denies difficulty with sleep initiation or maintenance. 6 hours; hard to let go of phone.  Denies appetite disturbance.  Patient reports that energy and motivation have been good.  Patient denies any difficulty with concentration.  Patient denies any suicidal ideation. No panic.  Hx noncompliance bc forgets.  Is using a pill box, and is aware if she forgets it.  Fearful to decrease meds.  Past Psychiatric Medication Trials:   Wellbutrin, Lexapro 10 clonazepam,   Adderall,   Episode depression early life after D born and then again later.  Review of Systems:  Review of Systems  Cardiovascular: Negative for palpitations.  Neurological: Negative for tremors and weakness.  Psychiatric/Behavioral: Positive for depression. Negative for agitation, behavioral problems, confusion, decreased concentration, dysphoric mood, hallucinations, self-injury, sleep disturbance and suicidal ideas. The patient is not nervous/anxious and is not hyperactive.     Medications: I have reviewed the patient's current medications.  Current Outpatient Medications  Medication Sig Dispense Refill  . atorvastatin (LIPITOR) 20 MG tablet Take 1 tablet (20 mg total) by mouth daily. 90 tablet 3  . buPROPion (WELLBUTRIN XL) 300 MG 24 hr tablet Take 1 tablet (300 mg total) by mouth every morning. 90 tablet 1  . clonazePAM (KLONOPIN) 0.5 MG tablet TAKE ONE-HALF (1/2) TABLET IN THE MORNING AND 2 TABLETS AT BEDTIME 225 tablet 1  . escitalopram (LEXAPRO) 10 MG tablet Take 1 tablet (10 mg total) by mouth daily. 90 tablet 1  . labetalol (NORMODYNE) 100 MG tablet Take 1 tablet (100 mg total) by mouth 2 (two) times daily. 180 tablet 1  . spironolactone (ALDACTONE) 25 MG tablet Take 1 tablet (25 mg total) by mouth 2 (two) times daily. 180 tablet 3  . triamcinolone ointment (KENALOG) 0.1 % Apply 1 application topically 2 (two) times daily. For rash on arms only. 30 g 0  . Vitamin D,  Ergocalciferol, (DRISDOL) 1.25 MG (50000 UNIT) CAPS capsule Take 1 capsule (50,000 Units total) by mouth every 7 (seven) days. 12 capsule 1   No current facility-administered medications for this visit.    Medication Side Effects: None  Allergies:  Allergies  Allergen Reactions  . Hydrochlorothiazide     REACTION: hypokalemia  . Benazepril Nausea And Vomiting    abd cramps    Past Medical History:  Diagnosis Date  . Anxiety   . Cellulitis    diagnosed Sept.7 2021,  left upper  posterior arm,  right upper posterior arm  . Depression   . Dry mouth   . Fibroids   . Gallbladder disease   . High cholesterol   . HTN (hypertension) 2007   seen in ER  with headaches  . Hypertension   . Hypokalemia 2007   due to HCTZ  . Nervousness   . Obesity   . Pre-diabetes   . Stress   . Swelling of lower extremity   . Trouble in sleeping     Family History  Problem Relation Age of Onset  . Cancer Maternal Grandmother        thyroid  . Hypertension Maternal Grandmother   . Hyperlipidemia Mother   . Hypertension Mother   . Depression Mother   . Anxiety disorder Mother   . Obesity Mother   . Breast cancer Maternal Aunt 51  . Kidney disease Maternal Aunt         X 2; 1 on Dialysis  . Cancer Maternal Aunt        ? primary; also had HTN  . Prostate cancer Paternal Uncle          3 uncles  . Prostate cancer Father   . Cancer Father   . Hypertension Paternal Grandmother   . Hypertension Maternal Aunt        X 2  . Bipolar disorder Paternal Aunt   . Stroke Neg Hx   . Diabetes Neg Hx   . Heart disease Neg Hx   . Colon cancer Neg Hx     Social History   Socioeconomic History  . Marital status: Single    Spouse name: Not on file  . Number of children: 1  . Years of education: Not on file  . Highest education level: Not on file  Occupational History  . Occupation: Cabin crew  Tobacco Use  . Smoking status: Never Smoker  . Smokeless tobacco: Never Used  Vaping Use  . Vaping Use: Never used  Substance and Sexual Activity  . Alcohol use: No  . Drug use: No  . Sexual activity: Not Currently    Comment: 1st intercourse- 15, partners- 10  Other Topics Concern  . Not on file  Social History Narrative  . Not on file   Social Determinants of Health   Financial Resource Strain:   . Difficulty of Paying Living Expenses: Not on file  Food Insecurity:   . Worried About Charity fundraiser in the Last Year: Not on file  . Ran Out of Food in  the Last Year: Not on file  Transportation Needs:   . Lack of Transportation (Medical): Not on file  . Lack of Transportation (Non-Medical): Not on file  Physical Activity:   . Days of Exercise per Week: Not on file  . Minutes of Exercise per Session: Not on file  Stress:   . Feeling of Stress : Not on file  Social Connections:   .  Frequency of Communication with Friends and Family: Not on file  . Frequency of Social Gatherings with Friends and Family: Not on file  . Attends Religious Services: Not on file  . Active Member of Clubs or Organizations: Not on file  . Attends Archivist Meetings: Not on file  . Marital Status: Not on file  Intimate Partner Violence:   . Fear of Current or Ex-Partner: Not on file  . Emotionally Abused: Not on file  . Physically Abused: Not on file  . Sexually Abused: Not on file    Past Medical History, Surgical history, Social history, and Family history were reviewed and updated as appropriate.   Please see review of systems for further details on the patient's review from today.   Objective:   Physical Exam:  LMP 12/01/2018 (Approximate)   Physical Exam Constitutional:      General: She is not in acute distress.    Appearance: She is well-developed.  Musculoskeletal:        General: No deformity.  Neurological:     Mental Status: She is alert and oriented to person, place, and time.     Motor: No tremor.     Coordination: Coordination normal.     Gait: Gait normal.  Psychiatric:        Attention and Perception: She is attentive. She does not perceive auditory or visual hallucinations.        Mood and Affect: Mood is anxious. Mood is not depressed. Affect is not labile, blunt, angry, tearful or inappropriate.        Speech: Speech normal.        Behavior: Behavior normal.        Thought Content: Thought content normal. Thought content does not include homicidal or suicidal ideation. Thought content does not include homicidal or  suicidal plan.        Cognition and Memory: Cognition normal.        Judgment: Judgment normal.     Comments: Insight intact. No auditory or visual hallucinations. No delusions.  Residual anxiety is manageable     Lab Review:     Component Value Date/Time   NA 139 11/18/2019 1446   NA 143 12/08/2018 0820   K 4.2 11/18/2019 1446   CL 109 11/18/2019 1446   CO2 21 (L) 11/18/2019 1446   GLUCOSE 68 (L) 11/18/2019 1446   BUN 17 11/18/2019 1446   BUN 12 12/08/2018 0820   CREATININE 1.17 (H) 11/18/2019 1446   CREATININE 1.44 (H) 11/07/2019 0848   CALCIUM 9.5 11/18/2019 1446   PROT 7.1 11/07/2019 0848   PROT 6.4 12/08/2018 0820   ALBUMIN 4.0 12/08/2018 0820   AST 23 11/07/2019 0848   ALT 26 11/07/2019 0848   ALKPHOS 142 (H) 12/08/2018 0820   BILITOT 0.5 11/07/2019 0848   BILITOT 0.4 12/08/2018 0820   GFRNONAA 53 (L) 11/18/2019 1446   GFRAA >60 11/18/2019 1446       Component Value Date/Time   WBC 19.8 (H) 11/28/2019 1403   RBC 4.08 11/28/2019 1403   HGB 11.5 (L) 11/28/2019 1403   HGB 12.8 05/05/2018 1252   HCT 35.8 (L) 11/28/2019 1403   HCT 40.4 05/05/2018 1252   PLT 275 11/28/2019 1403   MCV 87.7 11/28/2019 1403   MCV 86 05/05/2018 1252   MCH 28.2 11/28/2019 1403   MCHC 32.1 11/28/2019 1403   RDW 14.5 11/28/2019 1403   RDW 13.6 05/05/2018 1252   LYMPHSABS 1,903 11/07/2019 0848  LYMPHSABS 2.1 05/05/2018 1252   MONOABS 0.6 09/03/2018 0929   EOSABS 109 11/07/2019 0848   EOSABS 0.1 05/05/2018 1252   BASOSABS 62 11/07/2019 0848   BASOSABS 0.0 05/05/2018 1252    No results found for: POCLITH, LITHIUM   No results found for: PHENYTOIN, PHENOBARB, VALPROATE, CBMZ   Corrected low vitamin D 2020  .res Assessment: Plan:    Emelly was seen today for follow-up, anxiety, depression and sleeping problem.  Diagnoses and all orders for this visit:  Depression, major, recurrent, in complete remission ([place]) -     buPROPion (WELLBUTRIN XL) 300 MG 24 hr tablet; Take 1  tablet (300 mg total) by mouth every morning.  Panic disorder with agoraphobia -     clonazePAM (KLONOPIN) 0.5 MG tablet; TAKE ONE-HALF (1/2) TABLET IN THE MORNING AND 2 TABLETS AT BEDTIME -     escitalopram (LEXAPRO) 10 MG tablet; Take 1 tablet (10 mg total) by mouth daily.  Generalized anxiety disorder -     escitalopram (LEXAPRO) 10 MG tablet; Take 1 tablet (10 mg total) by mouth daily.  Insomnia due to mental condition -     clonazePAM (KLONOPIN) 0.5 MG tablet; TAKE ONE-HALF (1/2) TABLET IN THE MORNING AND 2 TABLETS AT BEDTIME    Greater than 50% of face to face time with patient was spent on counseling and coordination of care. We discussed Still sleep deprived but no longer DT work Schedule.  Discussed trying to get more sleep.  Could also help with her weight loss which is a goal.  Has gotten much better at setting boundaries with work and not overworking.  Mood is therefore better. Discussed meds and SE.  OK bz for flying phobia as needed.    Chronically a bit sleep deprived bc stays up too late too much.  Encourage protect it.  Not drowsy daytime.  Supportive therapy for getting laid off.  We discussed the short-term risks associated with benzodiazepines including sedation and increased fall risk among others.  Discussed long-term side effect risk including dependence, potential withdrawal symptoms, and the potential eventual dose-related risk of dementia. She feels that she is on the lowest effective dose.  No med changes indicated.  Per her request .  Doing well.  Now not time to stop it. Disc prognostic factors for relapse if she does eventually stop it.   This appt was 30 mins.  FU 6 mos.  Lynder Parents, MD, DFAPA   Please see After Visit Summary for patient specific instructions.  No future appointments.  No orders of the defined types were placed in this encounter.     -------------------------------

## 2020-04-16 ENCOUNTER — Other Ambulatory Visit: Payer: Self-pay | Admitting: Family Medicine

## 2020-04-16 DIAGNOSIS — E559 Vitamin D deficiency, unspecified: Secondary | ICD-10-CM

## 2020-04-18 ENCOUNTER — Encounter: Payer: Self-pay | Admitting: Family Medicine

## 2020-04-18 DIAGNOSIS — E559 Vitamin D deficiency, unspecified: Secondary | ICD-10-CM

## 2020-04-18 MED ORDER — VITAMIN D (ERGOCALCIFEROL) 1.25 MG (50000 UNIT) PO CAPS: 50000.0000 [IU] | ORAL_CAPSULE | ORAL | 0 refills | Status: DC

## 2020-06-26 IMAGING — MG DIGITAL SCREENING BILATERAL MAMMOGRAM WITH TOMO AND CAD
8 series · 8 of 24 positions shown · non-contrast
Comparison: Previous exam(s).

CLINICAL DATA: Screening.

EXAM:
DIGITAL SCREENING BILATERAL MAMMOGRAM WITH TOMO AND CAD

[L CC synth-2D]
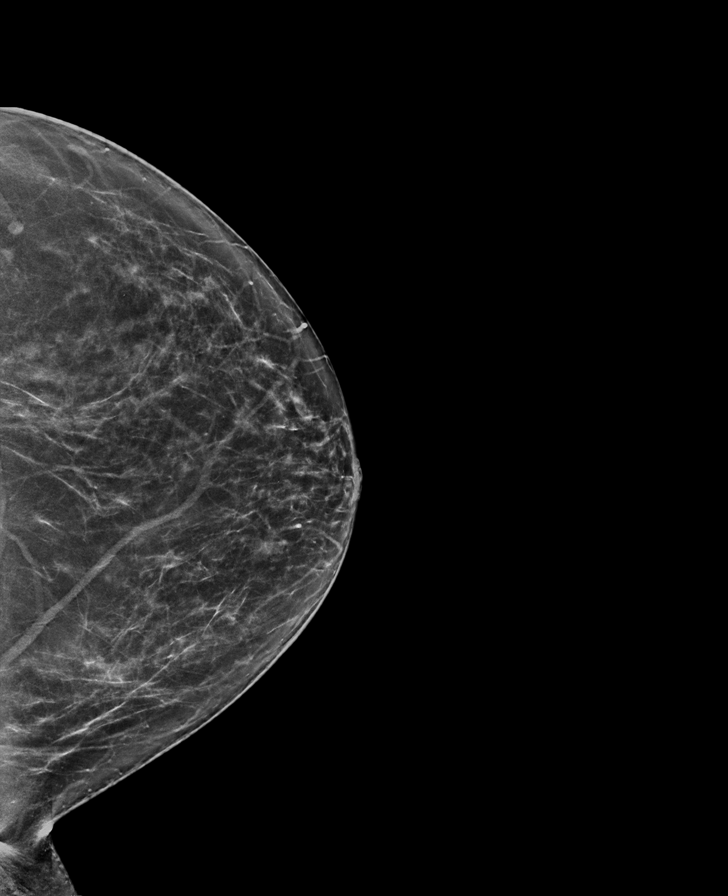

[R MLO synth-2D]
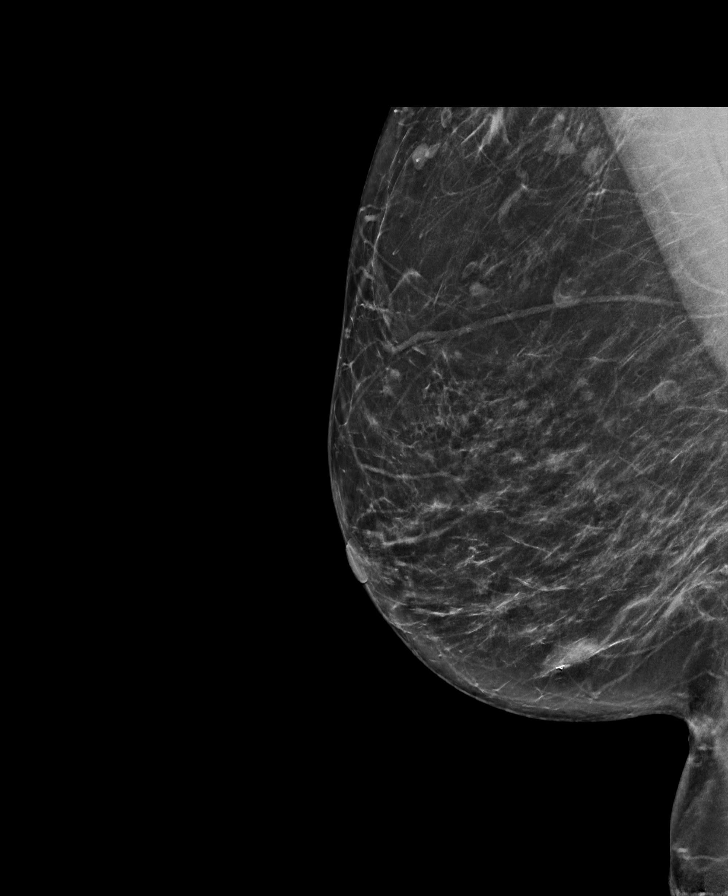

[R CC synth-2D]
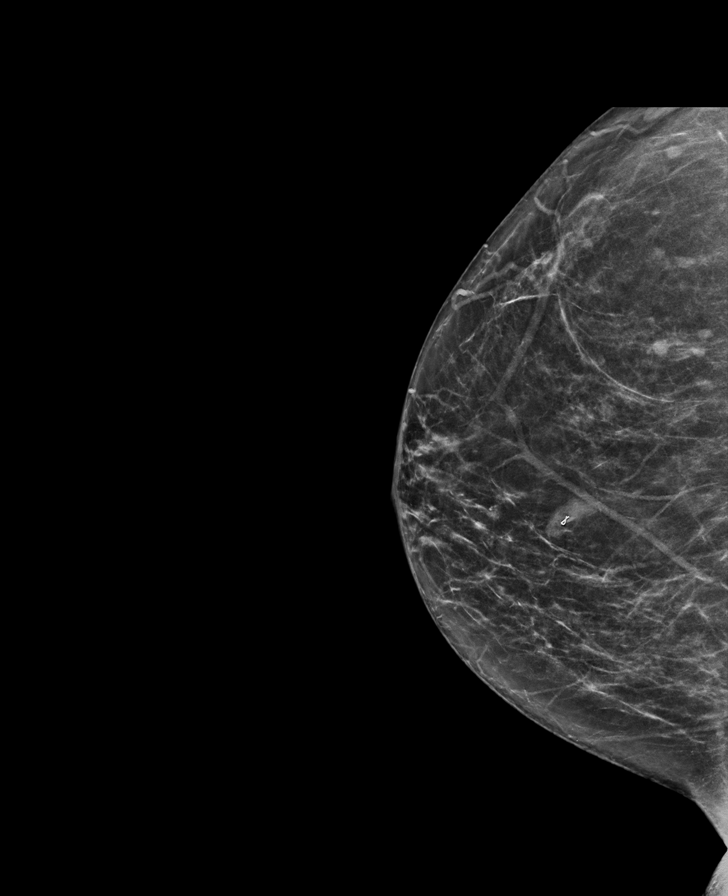

[L MLO synth-2D]
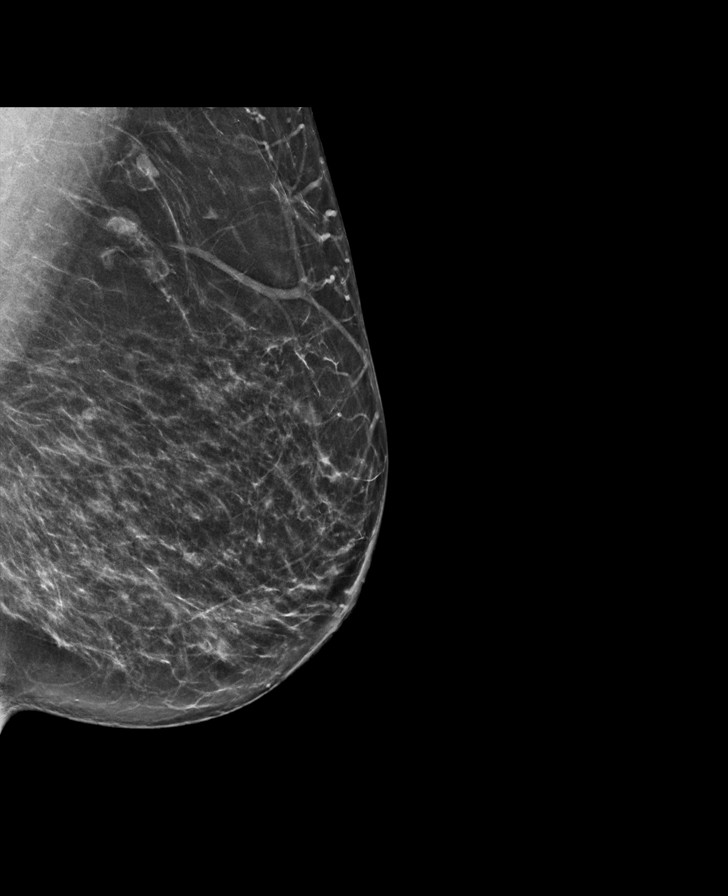

[L CC tomo · tomo slice 37/72.0]
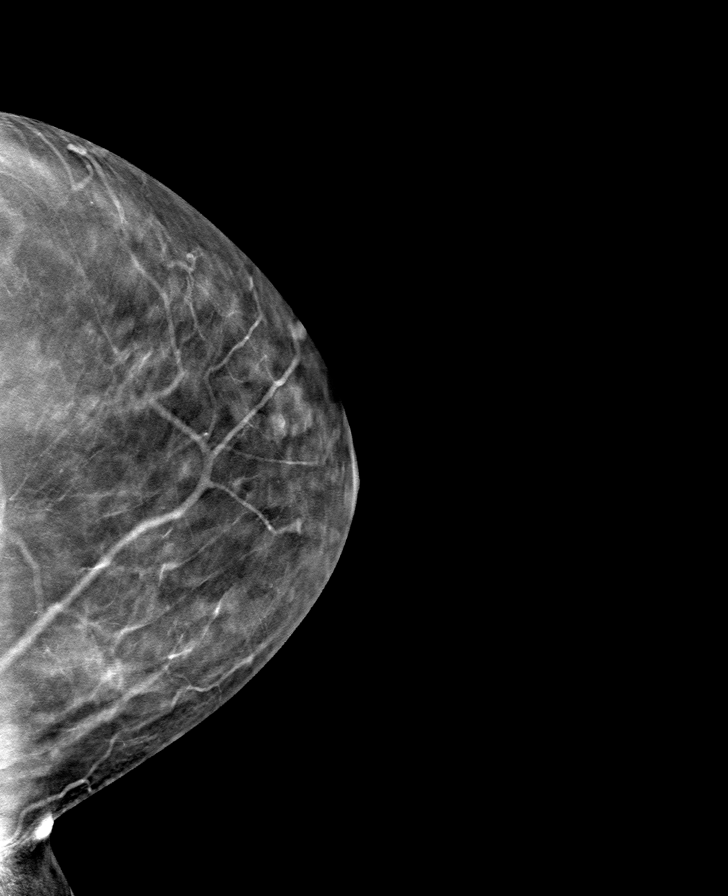

[R CC tomo · tomo slice 34/67.0]
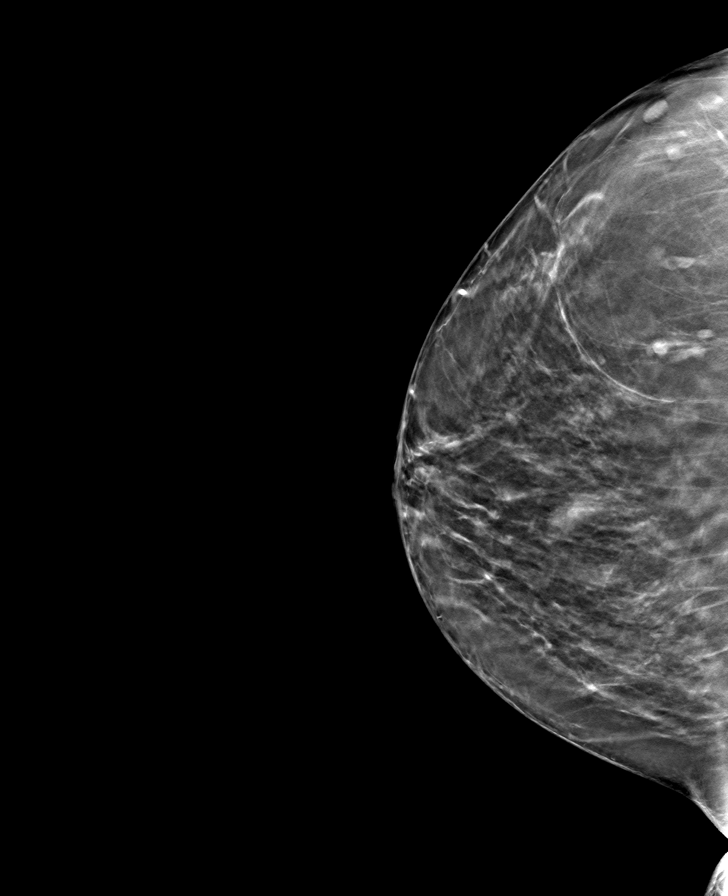

[L MLO tomo · tomo slice 36/71.0]
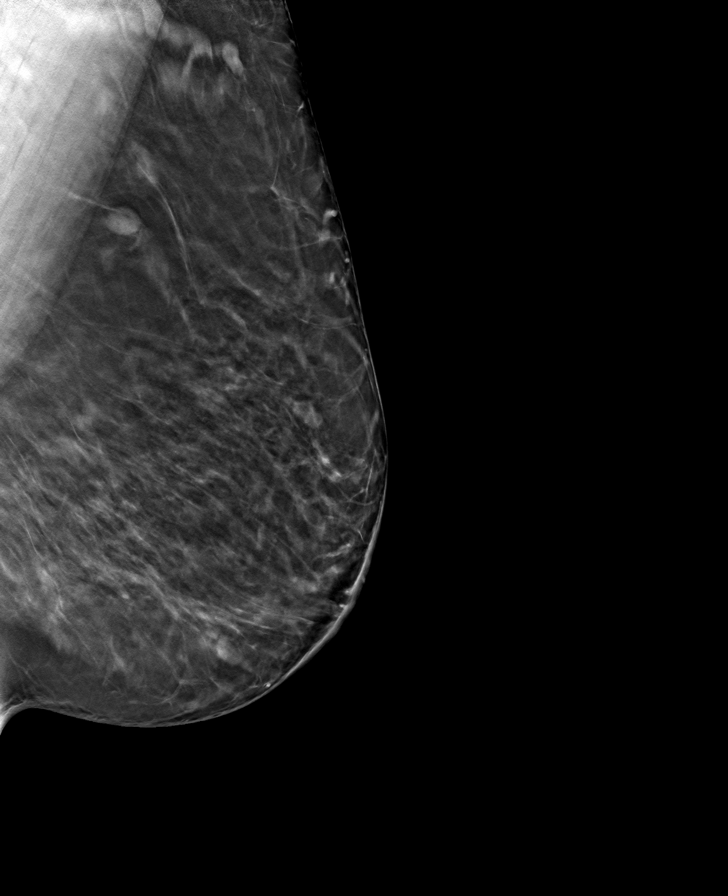

[R MLO tomo · tomo slice 37/74.0]
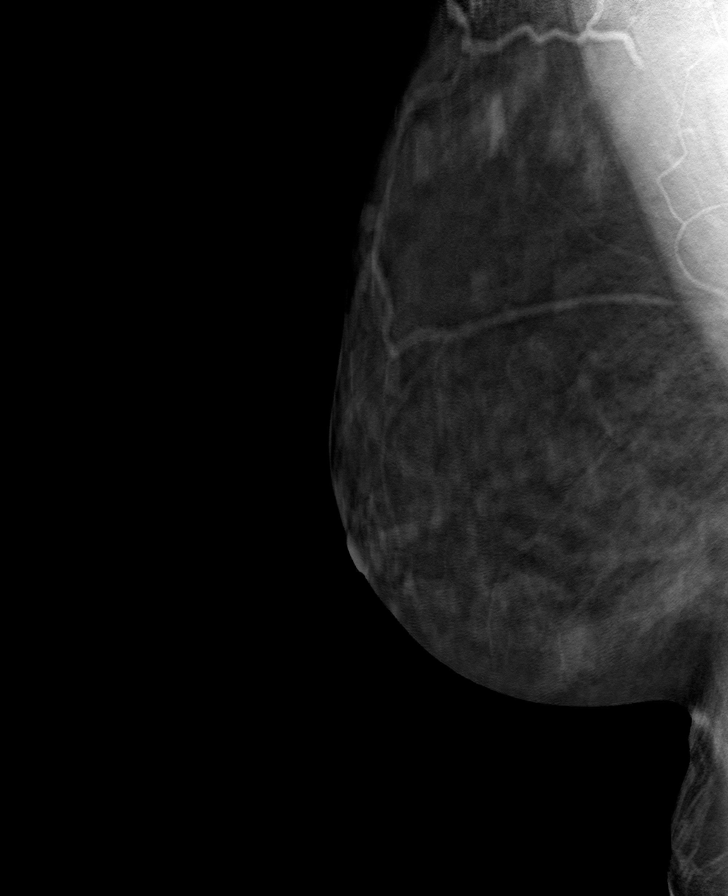

[8 of 24 positions shown; findings below may reference images not displayed]

ACR Breast Density Category c: The breast tissue is heterogeneously
dense, which may obscure small masses.
FINDINGS: There are no findings suspicious for malignancy. Images were
processed with CAD.
IMPRESSION: No mammographic evidence of malignancy. A result letter of this
screening mammogram will be mailed directly to the patient.

RECOMMENDATION:
Screening mammogram in one year. (Code:[id number]-[id number])

BI-RADS CATEGORY  1: Negative.

## 2020-07-31 ENCOUNTER — Encounter: Payer: Self-pay | Admitting: Psychiatry

## 2020-07-31 ENCOUNTER — Other Ambulatory Visit: Payer: Self-pay

## 2020-07-31 ENCOUNTER — Ambulatory Visit (INDEPENDENT_AMBULATORY_CARE_PROVIDER_SITE_OTHER): Payer: 59 | Admitting: Psychiatry

## 2020-07-31 DIAGNOSIS — F411 Generalized anxiety disorder: Secondary | ICD-10-CM

## 2020-07-31 DIAGNOSIS — F4001 Agoraphobia with panic disorder: Secondary | ICD-10-CM

## 2020-07-31 DIAGNOSIS — F3342 Major depressive disorder, recurrent, in full remission: Secondary | ICD-10-CM

## 2020-07-31 DIAGNOSIS — F5105 Insomnia due to other mental disorder: Secondary | ICD-10-CM | POA: Diagnosis not present

## 2020-07-31 NOTE — Progress Notes (Signed)
Cheryl Owens [id number] Aug 18, 1965 55 y.o.  Subjective:   Patient ID:  Cheryl Owens is a 55 y.o. (DOB Sep 16, 1965) female.  Chief Complaint:  Chief Complaint  Patient presents with  . Follow-up  . Depression, major, recurrent, in complete remission (Cheryl Owens)  . Anxiety  . Sexual Problem    Depression        Associated symptoms include no decreased concentration and no suicidal ideas.  Cheryl Owens presents to the office today for follow-up of depression and anxiety and history of insomnia and Bz withdrawal.    seen in June and December 2020.  No meds were changed.  She remained on Wellbutrin XL 300 mg, Lexapro 10 mg, vitamin D, and clonazepam 0.5 mg twice daily as needed.  Usually takes clonazepam 0.5 mg 1/2 AM and 1 in PM.  02/06/2020 appointment with the following noted: Still doing well overall.  Hasn't tried to reduce.  Don't want to ever go back to feeling like she did with the episode. Laid off and another person also.  Had 33 years with the company.  Got 6 mos severance pay.  Needs another job but not desperate over it.  Made money with real estate sales.  Hysterectomy went well. Overall feels ok about things but doesn't want to stop meds.  No panic. Recognizes some social anxiety.   Better with weight control.  Doing Healthy Weight and Wellness including doctor including mental health provider and counseling to help weight loss.   Work life balance is better overall. Plan: No med changes  07/31/2020 appointment with the following noted: Got laid off after 34 years and 9 mos. Hasn't found another job yet.  Would rather change fields.  Look for low stress and low responsibility. Also worried about agism.  Has interview tomorrow with [facility] physicians.  Not stretched for money.  Doesn't want to manage anyone or have too much responsibility. Emotionally things are pretty much the same.  D doing well with job.   No concerns with meds.    Patient reports stable mood and denies  depressed or irritable moods.  Patient denies any recent difficulty with anxiety.  Patient denies difficulty with sleep initiation or maintenance. 6 hours; hard to let go of phone.  Denies appetite disturbance.  Patient reports that energy and motivation have been good.  Patient denies any difficulty with concentration.  Patient denies any suicidal ideation. No panic.  Hx noncompliance bc forgets.  Is using a pill box, and is aware if she forgets it.  Fearful to decrease meds.  Past Psychiatric Medication Trials:  Wellbutrin, Lexapro 10 clonazepam,   Adderall,   Episode depression early life after D born and then again later.  Review of Systems:  Review of Systems  Cardiovascular: Negative for chest pain and palpitations.  Neurological: Negative for tremors and weakness.  Psychiatric/Behavioral: Positive for depression. Negative for agitation, behavioral problems, confusion, decreased concentration, dysphoric mood, hallucinations, self-injury, sleep disturbance and suicidal ideas. The patient is not nervous/anxious and is not hyperactive.     Medications: I have reviewed the patient's current medications.  Current Outpatient Medications  Medication Sig Dispense Refill  . atorvastatin (LIPITOR) 20 MG tablet Take 1 tablet (20 mg total) by mouth daily. 90 tablet 3  . buPROPion (WELLBUTRIN XL) 300 MG 24 hr tablet Take 1 tablet (300 mg total) by mouth every morning. 90 tablet 1  . clonazePAM (KLONOPIN) 0.5 MG tablet TAKE ONE-HALF (1/2) TABLET IN THE MORNING AND 2 TABLETS AT BEDTIME  225 tablet 1  . escitalopram (LEXAPRO) 10 MG tablet Take 1 tablet (10 mg total) by mouth daily. 90 tablet 1  . labetalol (NORMODYNE) 100 MG tablet Take 1 tablet (100 mg total) by mouth 2 (two) times daily. 180 tablet 1  . spironolactone (ALDACTONE) 25 MG tablet Take 1 tablet (25 mg total) by mouth 2 (two) times daily. 180 tablet 3  . Vitamin D, Ergocalciferol, (DRISDOL) 1.25 MG (50000 UNIT) CAPS capsule Take 1  capsule (50,000 Units total) by mouth every 7 (seven) days. 12 capsule 0  . triamcinolone ointment (KENALOG) 0.1 % Apply 1 application topically 2 (two) times daily. For rash on arms only. 30 g 0   No current facility-administered medications for this visit.    Medication Side Effects: None  Allergies:  Allergies  Allergen Reactions  . Hydrochlorothiazide     REACTION: hypokalemia  . Benazepril Nausea And Vomiting    abd cramps    Past Medical History:  Diagnosis Date  . Anxiety   . Cellulitis    diagnosed Sept.7 2021, left upper  posterior arm,  right upper posterior arm  . Depression   . Dry mouth   . Fibroids   . Gallbladder disease   . High cholesterol   . HTN (hypertension) 2007   seen in ER  with headaches  . Hypertension   . Hypokalemia 2007   due to HCTZ  . Nervousness   . Obesity   . Pre-diabetes   . Stress   . Swelling of lower extremity   . Trouble in sleeping     Family History  Problem Relation Age of Onset  . Cancer Maternal Grandmother        thyroid  . Hypertension Maternal Grandmother   . Hyperlipidemia Mother   . Hypertension Mother   . Depression Mother   . Anxiety disorder Mother   . Obesity Mother   . Breast cancer Maternal Aunt 51  . Kidney disease Maternal Aunt         X 2; 1 on Dialysis  . Cancer Maternal Aunt        ? primary; also had HTN  . Prostate cancer Paternal Uncle          3 uncles  . Prostate cancer Father   . Cancer Father   . Hypertension Paternal Grandmother   . Hypertension Maternal Aunt        X 2  . Bipolar disorder Paternal Aunt   . Stroke Neg Hx   . Diabetes Neg Hx   . Heart disease Neg Hx   . Colon cancer Neg Hx     Social History   Socioeconomic History  . Marital status: Single    Spouse name: Not on file  . Number of children: 1  . Years of education: Not on file  . Highest education level: Not on file  Occupational History  . Occupation: Cabin crew  Tobacco Use  . Smoking status: Never  Smoker  . Smokeless tobacco: Never Used  Vaping Use  . Vaping Use: Never used  Substance and Sexual Activity  . Alcohol use: No  . Drug use: No  . Sexual activity: Not Currently    Comment: 1st intercourse- 15, partners- 10  Other Topics Concern  . Not on file  Social History Narrative  . Not on file   Social Determinants of Health   Financial Resource Strain: Not on file  Food Insecurity: Not on file  Transportation Needs: Not on  file  Physical Activity: Not on file  Stress: Not on file  Social Connections: Not on file  Intimate Partner Violence: Not on file    Past Medical History, Surgical history, Social history, and Family history were reviewed and updated as appropriate.   Please see review of systems for further details on the patient's review from today.   Objective:   Physical Exam:  LMP 12/01/2018 (Approximate)   Physical Exam Constitutional:      General: She is not in acute distress.    Appearance: She is well-developed.  Musculoskeletal:        General: No deformity.  Neurological:     Mental Status: She is alert and oriented to person, place, and time.     Motor: No tremor.     Coordination: Coordination normal.     Gait: Gait normal.  Psychiatric:        Attention and Perception: She is attentive. She does not perceive auditory or visual hallucinations.        Mood and Affect: Mood is not anxious or depressed. Affect is not labile, blunt, angry, tearful or inappropriate.        Speech: Speech normal.        Behavior: Behavior normal.        Thought Content: Thought content normal. Thought content does not include homicidal or suicidal ideation. Thought content does not include homicidal or suicidal plan.        Cognition and Memory: Cognition normal.        Judgment: Judgment normal.     Comments: Insight intact. No auditory or visual hallucinations. No delusions.       Lab Review:     Component Value Date/Time   NA 139 11/18/2019 1446    NA 143 12/08/2018 0820   K 4.2 11/18/2019 1446   CL 109 11/18/2019 1446   CO2 21 (L) 11/18/2019 1446   GLUCOSE 68 (L) 11/18/2019 1446   BUN 17 11/18/2019 1446   BUN 12 12/08/2018 0820   CREATININE 1.17 (H) 11/18/2019 1446   CREATININE 1.44 (H) 11/07/2019 0848   CALCIUM 9.5 11/18/2019 1446   PROT 7.1 11/07/2019 0848   PROT 6.4 12/08/2018 0820   ALBUMIN 4.0 12/08/2018 0820   AST 23 11/07/2019 0848   ALT 26 11/07/2019 0848   ALKPHOS 142 (H) 12/08/2018 0820   BILITOT 0.5 11/07/2019 0848   BILITOT 0.4 12/08/2018 0820   GFRNONAA 53 (L) 11/18/2019 1446   GFRAA >60 11/18/2019 1446       Component Value Date/Time   WBC 19.8 (H) 11/28/2019 1403   RBC 4.08 11/28/2019 1403   HGB 11.5 (L) 11/28/2019 1403   HGB 12.8 05/05/2018 1252   HCT 35.8 (L) 11/28/2019 1403   HCT 40.4 05/05/2018 1252   PLT 275 11/28/2019 1403   MCV 87.7 11/28/2019 1403   MCV 86 05/05/2018 1252   MCH 28.2 11/28/2019 1403   MCHC 32.1 11/28/2019 1403   RDW 14.5 11/28/2019 1403   RDW 13.6 05/05/2018 1252   LYMPHSABS 1,903 11/07/2019 0848   LYMPHSABS 2.1 05/05/2018 1252   MONOABS 0.6 09/03/2018 0929   EOSABS 109 11/07/2019 0848   EOSABS 0.1 05/05/2018 1252   BASOSABS 62 11/07/2019 0848   BASOSABS 0.0 05/05/2018 1252    No results found for: POCLITH, LITHIUM   No results found for: PHENYTOIN, PHENOBARB, VALPROATE, CBMZ   Corrected low vitamin D 2020  .res Assessment: Plan:    Maisa was seen today for follow-up, depression,  major, recurrent, in complete remission ([facility]), anxiety and sexual problem.  Diagnoses and all orders for this visit:  Depression, major, recurrent, in complete remission ([place])  Panic disorder with agoraphobia  Generalized anxiety disorder  Insomnia due to mental condition    Greater than 50% of face to face time with patient was spent on counseling and coordination of care. .  Could also help with her weight loss which is a goal.  Has gotten much better at setting boundaries with  work and not overworking.  Mood is therefore better. Discussed meds and SE.   OK bz for flying phobia as needed.    Chronically a bit sleep deprived bc stays up too late too much.  Encourage protect it.  Not drowsy daytime.  Supportive therapy for looking for job.  We discussed the short-term risks associated with benzodiazepines including sedation and increased fall risk among others.  Discussed long-term side effect risk including dependence, potential withdrawal symptoms, and the potential eventual dose-related risk of dementia. She feels that she is on the lowest effective dose.  No med changes indicated.  Per her request .  Doing well.  Now not time to stop it. Disc prognostic factors for relapse if she does eventually stop it.  She feels her sx were induced by work and doesn't have it anymore.  Consider taper.   This appt was 30 mins.  FU 6 mos.  Lynder Parents, MD, DFAPA   Please see After Visit Summary for patient specific instructions.  Future Appointments  Date Time Provider [place]  08/02/2020  3:20 PM [name], Kendrick Fries R, DO [id number]    No orders of the defined types were placed in this encounter.     -------------------------------

## 2020-08-02 ENCOUNTER — Ambulatory Visit (INDEPENDENT_AMBULATORY_CARE_PROVIDER_SITE_OTHER): Payer: 59 | Admitting: Family Medicine

## 2020-08-02 ENCOUNTER — Encounter: Payer: Self-pay | Admitting: Family Medicine

## 2020-08-02 ENCOUNTER — Other Ambulatory Visit: Payer: Self-pay

## 2020-08-02 VITALS — BP 110/70 | HR 81 | Temp 98.4°F | Resp 18 | Ht 67.0 in | Wt 248.2 lb

## 2020-08-02 DIAGNOSIS — Z6836 Body mass index (BMI) 36.0-36.9, adult: Secondary | ICD-10-CM

## 2020-08-02 DIAGNOSIS — E785 Hyperlipidemia, unspecified: Secondary | ICD-10-CM | POA: Diagnosis not present

## 2020-08-02 DIAGNOSIS — R7303 Prediabetes: Secondary | ICD-10-CM

## 2020-08-02 DIAGNOSIS — I10 Essential (primary) hypertension: Secondary | ICD-10-CM | POA: Diagnosis not present

## 2020-08-02 MED ORDER — LABETALOL HCL 100 MG PO TABS: 100.0000 mg | ORAL_TABLET | Freq: Two times a day (BID) | ORAL | 3 refills | Status: DC

## 2020-08-02 MED ORDER — SPIRONOLACTONE 25 MG PO TABS: 25.0000 mg | ORAL_TABLET | Freq: Two times a day (BID) | ORAL | 3 refills | Status: DC

## 2020-08-02 MED ORDER — ATORVASTATIN CALCIUM 20 MG PO TABS: 20.0000 mg | ORAL_TABLET | Freq: Every day | ORAL | 3 refills | Status: DC

## 2020-08-02 NOTE — Assessment & Plan Note (Signed)
Encouraged heart healthy diet, increase exercise, avoid trans fats, consider a krill oil cap daily 

## 2020-08-02 NOTE — Patient Instructions (Signed)

## 2020-08-02 NOTE — Assessment & Plan Note (Signed)
Well controlled, no changes to meds. Encouraged heart healthy diet such as the DASH diet and exercise as tolerated.  °

## 2020-08-02 NOTE — Progress Notes (Signed)
Subjective:   By signing my name below, I, Cheryl Owens, attest that this documentation has been prepared under the direction and in the presence of Dr. Roma Schanz, DO. 08/02/2020     Patient ID: Cheryl Owens, female    DOB: December 23, 1965, 55 y.o.   MRN: [id number]  Chief Complaint  Patient presents with  . Hypertension  . Hyperlipidemia  . Follow-up    HPI Patient is in today for a office visit. Her blood pressure is doing well at this time. She is not doing labs at this time because she had eaten before she came her. She has 2 pfizer Covid-19 vaccines at this time.  Her arthritis symptoms have started flare up recently. She is going to make an appointment with rheumatology to manage her symptoms.    Past Medical History:  Diagnosis Date  . Anxiety   . Cellulitis    diagnosed Sept.7 2021, left upper  posterior arm,  right upper posterior arm  . Depression   . Dry mouth   . Fibroids   . Gallbladder disease   . High cholesterol   . HTN (hypertension) 2007   seen in ER  with headaches  . Hypertension   . Hypokalemia 2007   due to HCTZ  . Nervousness   . Obesity   . Pre-diabetes   . Stress   . Swelling of lower extremity   . Trouble in sleeping     Past Surgical History:  Procedure Laterality Date  . arm surgery     post trauma @ age 62  . BREAST SURGERY Right 2016   BREAST BX   . CHOLECYSTECTOMY     gall stones  . GANGLION CYST EXCISION N/A    wrist  . ROBOTIC ASSISTED TOTAL HYSTERECTOMY WITH BILATERAL SALPINGO OOPHERECTOMY Bilateral 11/28/2019   Procedure: XI ROBOTIC ASSISTED TOTAL LAPAROSCOPIC HYSTERECTOMY WITH BILATERAL SALPINGO OOPHORECTOMY;  Surgeon: Princess Bruins, MD;  Location: [facility];  Service: Gynecology;  Laterality: Bilateral;  request 2 1/2 hours OR time request 8:30am OR start in [facility]  . WISDOM TOOTH EXTRACTION      Family History  Problem Relation Age of Onset  . Cancer Maternal Grandmother         thyroid  . Hypertension Maternal Grandmother   . Hyperlipidemia Mother   . Hypertension Mother   . Depression Mother   . Anxiety disorder Mother   . Obesity Mother   . Breast cancer Maternal Aunt 51  . Kidney disease Maternal Aunt         X 2; 1 on Dialysis  . Cancer Maternal Aunt        ? primary; also had HTN  . Prostate cancer Paternal Uncle          3 uncles  . Prostate cancer Father   . Cancer Father   . Hypertension Paternal Grandmother   . Hypertension Maternal Aunt        X 2  . Bipolar disorder Paternal Aunt   . Stroke Neg Hx   . Diabetes Neg Hx   . Heart disease Neg Hx   . Colon cancer Neg Hx     Social History   Socioeconomic History  . Marital status: Single    Spouse name: Not on file  . Number of children: 1  . Years of education: Not on file  . Highest education level: Not on file  Occupational History  . Occupation: Cabin crew  Tobacco Use  .  Smoking status: Never Smoker  . Smokeless tobacco: Never Used  Vaping Use  . Vaping Use: Never used  Substance and Sexual Activity  . Alcohol use: No  . Drug use: No  . Sexual activity: Not Currently    Comment: 1st intercourse- 15, partners- 10  Other Topics Concern  . Not on file  Social History Narrative  . Not on file   Social Determinants of Health   Financial Resource Strain: Not on file  Food Insecurity: Not on file  Transportation Needs: Not on file  Physical Activity: Not on file  Stress: Not on file  Social Connections: Not on file  Intimate Partner Violence: Not on file    Outpatient Medications Prior to Visit  Medication Sig Dispense Refill  . buPROPion (WELLBUTRIN XL) 300 MG 24 hr tablet Take 1 tablet (300 mg total) by mouth every morning. 90 tablet 1  . clonazePAM (KLONOPIN) 0.5 MG tablet TAKE ONE-HALF (1/2) TABLET IN THE MORNING AND 2 TABLETS AT BEDTIME 225 tablet 1  . escitalopram (LEXAPRO) 10 MG tablet Take 1 tablet (10 mg total) by mouth daily. 90 tablet 1  . Vitamin D,  Ergocalciferol, (DRISDOL) 1.25 MG (50000 UNIT) CAPS capsule Take 1 capsule (50,000 Units total) by mouth every 7 (seven) days. 12 capsule 0  . atorvastatin (LIPITOR) 20 MG tablet Take 1 tablet (20 mg total) by mouth daily. 90 tablet 3  . labetalol (NORMODYNE) 100 MG tablet Take 1 tablet (100 mg total) by mouth 2 (two) times daily. 180 tablet 1  . spironolactone (ALDACTONE) 25 MG tablet Take 1 tablet (25 mg total) by mouth 2 (two) times daily. 180 tablet 3  . triamcinolone ointment (KENALOG) 0.1 % Apply 1 application topically 2 (two) times daily. For rash on arms only. 30 g 0   No facility-administered medications prior to visit.    Allergies  Allergen Reactions  . Hydrochlorothiazide     REACTION: hypokalemia  . Benazepril Nausea And Vomiting    abd cramps    Review of Systems  Constitutional: Negative for chills, fever and malaise/fatigue.  HENT: Negative for congestion and hearing loss.   Eyes: Negative for discharge.  Respiratory: Negative for cough, sputum production and shortness of breath.   Cardiovascular: Negative for chest pain, palpitations and leg swelling.  Gastrointestinal: Negative for abdominal pain, blood in stool, constipation, diarrhea, heartburn, nausea and vomiting.  Genitourinary: Negative for dysuria, frequency, hematuria and urgency.  Musculoskeletal: Positive for joint pain (Right hand and left foot.). Negative for back pain, falls and myalgias.  Skin: Negative for rash.  Neurological: Negative for dizziness, sensory change, loss of consciousness, weakness and headaches.  Endo/Heme/Allergies: Negative for environmental allergies. Does not bruise/bleed easily.  Psychiatric/Behavioral: Negative for depression and suicidal ideas. The patient is not nervous/anxious and does not have insomnia.        Objective:    Physical Exam Vitals and nursing note reviewed.  Constitutional:      General: She is not in acute distress.    Appearance: Normal appearance. She  is well-developed. She is not ill-appearing.  HENT:     Head: Normocephalic and atraumatic.     Right Ear: External ear normal.     Left Ear: External ear normal.  Eyes:     Extraocular Movements: Extraocular movements intact.     Conjunctiva/sclera: Conjunctivae normal.     Pupils: Pupils are equal, round, and reactive to light.  Neck:     Thyroid: No thyromegaly.     Vascular:  No carotid bruit or JVD.  Cardiovascular:     Rate and Rhythm: Normal rate and regular rhythm.     Pulses: Normal pulses.     Heart sounds: Normal heart sounds. No murmur heard. No gallop.   Pulmonary:     Effort: Pulmonary effort is normal. No respiratory distress.     Breath sounds: Normal breath sounds. No wheezing, rhonchi or rales.  Chest:     Chest wall: No tenderness.  Musculoskeletal:     Cervical back: Normal range of motion and neck supple.  Skin:    General: Skin is warm and dry.  Neurological:     Mental Status: She is alert and oriented to person, place, and time.  Psychiatric:        Behavior: Behavior normal.     BP 110/70 (BP Location: Right Arm, Patient Position: Sitting, Cuff Size: Large)   Pulse 81   Temp 98.4 F (36.9 C) (Oral)   Resp 18   Ht 5\' 7"  (1.702 m)   Wt 248 lb 3.2 oz (112.6 kg)   LMP 12/01/2018 (Approximate)   SpO2 97%   BMI 38.87 kg/m  Wt Readings from Last 3 Encounters:  08/02/20 248 lb 3.2 oz (112.6 kg)  11/28/19 245 lb 9.6 oz (111.4 kg)  11/21/19 246 lb 9.6 oz (111.9 kg)    Diabetic Foot Exam - Simple   No data filed    Lab Results  Component Value Date   WBC 19.8 (H) 11/28/2019   HGB 11.5 (L) 11/28/2019   HCT 35.8 (L) 11/28/2019   PLT 275 11/28/2019   GLUCOSE 68 (L) 11/18/2019   CHOL 171 11/07/2019   TRIG 81 11/07/2019   HDL 55 11/07/2019   LDLCALC 99 11/07/2019   ALT 26 11/07/2019   AST 23 11/07/2019   NA 139 11/18/2019   K 4.2 11/18/2019   CL 109 11/18/2019   CREATININE 1.17 (H) 11/18/2019   BUN 17 11/18/2019   CO2 21 (L) 11/18/2019    TSH 2.77 11/07/2019   HGBA1C 5.3 11/18/2019   MICROALBUR 3.1 (H) 05/05/2007    Lab Results  Component Value Date   TSH 2.77 11/07/2019   Lab Results  Component Value Date   WBC 19.8 (H) 11/28/2019   HGB 11.5 (L) 11/28/2019   HCT 35.8 (L) 11/28/2019   MCV 87.7 11/28/2019   PLT 275 11/28/2019   Lab Results  Component Value Date   NA 139 11/18/2019   K 4.2 11/18/2019   CO2 21 (L) 11/18/2019   GLUCOSE 68 (L) 11/18/2019   BUN 17 11/18/2019   CREATININE 1.17 (H) 11/18/2019   BILITOT 0.5 11/07/2019   ALKPHOS 142 (H) 12/08/2018   AST 23 11/07/2019   ALT 26 11/07/2019   PROT 7.1 11/07/2019   ALBUMIN 4.0 12/08/2018   CALCIUM 9.5 11/18/2019   ANIONGAP 9 11/18/2019   GFR 50.00 (L) 09/03/2018   Lab Results  Component Value Date   CHOL 171 11/07/2019   Lab Results  Component Value Date   HDL 55 11/07/2019   Lab Results  Component Value Date   LDLCALC 99 11/07/2019   Lab Results  Component Value Date   TRIG 81 11/07/2019   Lab Results  Component Value Date   CHOLHDL 3.1 11/07/2019   Lab Results  Component Value Date   HGBA1C 5.3 11/18/2019       Assessment & Plan:   Problem List Items Addressed This Visit      Unprioritized   Class  2 severe obesity with serious comorbidity and body mass index (BMI) of 36.0 to 36.9 in adult [facility])   Essential hypertension    Well controlled, no changes to meds. Encouraged heart healthy diet such as the DASH diet and exercise as tolerated.       Relevant Medications   labetalol (NORMODYNE) 100 MG tablet   atorvastatin (LIPITOR) 20 MG tablet   spironolactone (ALDACTONE) 25 MG tablet   Other Relevant Orders   Comprehensive metabolic panel   Lipid panel   Hyperlipidemia LDL goal <100    Encouraged heart healthy diet, increase exercise, avoid trans fats, consider a krill oil cap daily      Relevant Medications   labetalol (NORMODYNE) 100 MG tablet   atorvastatin (LIPITOR) 20 MG tablet   spironolactone (ALDACTONE) 25  MG tablet   Other Relevant Orders   Comprehensive metabolic panel   Lipid panel   Prediabetes - Primary   Relevant Orders   Hemoglobin A1c   Insulin, random       Meds ordered this encounter  Medications  . labetalol (NORMODYNE) 100 MG tablet    Sig: Take 1 tablet (100 mg total) by mouth 2 (two) times daily.    Dispense:  180 tablet    Refill:  3  . atorvastatin (LIPITOR) 20 MG tablet    Sig: Take 1 tablet (20 mg total) by mouth daily.    Dispense:  90 tablet    Refill:  3  . spironolactone (ALDACTONE) 25 MG tablet    Sig: Take 1 tablet (25 mg total) by mouth 2 (two) times daily.    Dispense:  180 tablet    Refill:  3    I, Dr. Roma Schanz, DO, personally preformed the services described in this documentation.  All medical record entries made by the scribe were at my direction and in my presence.  I have reviewed the chart and discharge instructions (if applicable) and agree that the record reflects my personal performance and is accurate and complete. 08/02/2020   I,Cheryl Owens,acting as a scribe for Ann Held, DO.,have documented all relevant documentation on the behalf of Ann Held, DO,as directed by  Ann Held, DO while in the presence of Ann Held, DO.   Ann Held, DO

## 2020-08-08 ENCOUNTER — Encounter: Payer: Self-pay | Admitting: Family Medicine

## 2020-08-08 DIAGNOSIS — E785 Hyperlipidemia, unspecified: Secondary | ICD-10-CM

## 2020-08-08 DIAGNOSIS — I10 Essential (primary) hypertension: Secondary | ICD-10-CM

## 2020-08-09 ENCOUNTER — Other Ambulatory Visit: Payer: Self-pay

## 2020-08-09 ENCOUNTER — Telehealth: Payer: Self-pay | Admitting: Psychiatry

## 2020-08-09 ENCOUNTER — Other Ambulatory Visit (INDEPENDENT_AMBULATORY_CARE_PROVIDER_SITE_OTHER): Payer: 59

## 2020-08-09 DIAGNOSIS — I10 Essential (primary) hypertension: Secondary | ICD-10-CM

## 2020-08-09 DIAGNOSIS — E785 Hyperlipidemia, unspecified: Secondary | ICD-10-CM

## 2020-08-09 DIAGNOSIS — E559 Vitamin D deficiency, unspecified: Secondary | ICD-10-CM

## 2020-08-09 DIAGNOSIS — R7303 Prediabetes: Secondary | ICD-10-CM

## 2020-08-09 LAB — HEMOGLOBIN A1C: Hgb A1c MFr Bld: 6.1 % (ref 4.6–6.5)

## 2020-08-09 NOTE — Telephone Encounter (Signed)
Pt left a message asking dr. Clovis Pu for an update on the job opportunity he spoke to her about at last visit. She wanted to know was it still available. Please give her a call at [phone]

## 2020-08-10 LAB — COMPREHENSIVE METABOLIC PANEL
ALT: 16 U/L (ref 0–35)
AST: 16 U/L (ref 0–37)
Albumin: 4 g/dL (ref 3.5–5.2)
Alkaline Phosphatase: 147 U/L — ABNORMAL HIGH (ref 39–117)
BUN: 16 mg/dL (ref 6–23)
CO2: 23 mEq/L (ref 19–32)
Calcium: 9.6 mg/dL (ref 8.4–10.5)
Chloride: 107 mEq/L (ref 96–112)
Creatinine, Ser: 1.42 mg/dL — ABNORMAL HIGH (ref 0.40–1.20)
GFR: 41.71 mL/min — ABNORMAL LOW (ref >60.00)
Glucose, Bld: 100 mg/dL — ABNORMAL HIGH (ref 70–99)
Potassium: 4.3 mEq/L (ref 3.5–5.1)
Sodium: 141 mEq/L (ref 135–145)
Total Bilirubin: 0.5 mg/dL (ref 0.2–1.2)
Total Protein: 6.8 g/dL (ref 6.0–8.3)

## 2020-08-10 LAB — LIPID PANEL
Cholesterol: 144 mg/dL (ref 0–200)
HDL: 44.8 mg/dL (ref >39.00)
LDL Cholesterol: 84 mg/dL (ref 0–99)
NonHDL: 99.14
Total CHOL/HDL Ratio: 3
Triglycerides: 75 mg/dL (ref 0.0–149.0)
VLDL: 15 mg/dL (ref 0.0–40.0)

## 2020-08-10 LAB — INSULIN, RANDOM: Insulin: 21.1 u[IU]/mL — ABNORMAL HIGH

## 2020-08-14 ENCOUNTER — Other Ambulatory Visit: Payer: Self-pay

## 2020-08-14 ENCOUNTER — Other Ambulatory Visit: Payer: Self-pay | Admitting: Family Medicine

## 2020-08-14 DIAGNOSIS — E8881 Metabolic syndrome: Secondary | ICD-10-CM

## 2020-08-14 MED ORDER — METFORMIN HCL ER 500 MG PO TB24: 500.0000 mg | ORAL_TABLET | Freq: Every day | ORAL | 2 refills | Status: DC

## 2020-08-16 MED ORDER — LABETALOL HCL 100 MG PO TABS: 100.0000 mg | ORAL_TABLET | Freq: Two times a day (BID) | ORAL | 3 refills | Status: DC

## 2020-08-16 MED ORDER — ATORVASTATIN CALCIUM 20 MG PO TABS: 20.0000 mg | ORAL_TABLET | Freq: Every day | ORAL | 3 refills | Status: DC

## 2020-08-16 MED ORDER — SPIRONOLACTONE 25 MG PO TABS: 25.0000 mg | ORAL_TABLET | Freq: Two times a day (BID) | ORAL | 3 refills | Status: DC

## 2020-08-17 ENCOUNTER — Other Ambulatory Visit: Payer: Self-pay

## 2020-08-17 DIAGNOSIS — N1831 Chronic kidney disease, stage 3a: Secondary | ICD-10-CM

## 2020-09-09 ENCOUNTER — Encounter: Payer: Self-pay | Admitting: Family Medicine

## 2020-09-09 DIAGNOSIS — E559 Vitamin D deficiency, unspecified: Secondary | ICD-10-CM

## 2020-09-11 MED ORDER — VITAMIN D (ERGOCALCIFEROL) 1.25 MG (50000 UNIT) PO CAPS: 50000.0000 [IU] | ORAL_CAPSULE | ORAL | 1 refills | Status: DC

## 2020-10-19 NOTE — Telephone Encounter (Signed)
Patient has recall letter for annual exam 10/2020. Please schedule.

## 2020-10-22 ENCOUNTER — Other Ambulatory Visit: Payer: Self-pay | Admitting: Internal Medicine

## 2020-10-22 DIAGNOSIS — N1831 Chronic kidney disease, stage 3a: Secondary | ICD-10-CM

## 2020-11-05 ENCOUNTER — Telehealth: Payer: Self-pay | Admitting: Psychiatry

## 2020-11-05 ENCOUNTER — Encounter: Payer: Self-pay | Admitting: Family Medicine

## 2020-11-05 ENCOUNTER — Other Ambulatory Visit: Payer: Self-pay

## 2020-11-05 DIAGNOSIS — F3342 Major depressive disorder, recurrent, in full remission: Secondary | ICD-10-CM

## 2020-11-05 MED ORDER — BUPROPION HCL ER (XL) 300 MG PO TB24: 300.0000 mg | ORAL_TABLET | Freq: Every morning | ORAL | 0 refills | Status: DC

## 2020-11-05 NOTE — Telephone Encounter (Signed)
Please send in a  BupropionRF  to [facility] on [address]. She is switching pharmacies for that [place].

## 2020-11-05 NOTE — Telephone Encounter (Signed)
Rx sent 

## 2020-11-08 ENCOUNTER — Other Ambulatory Visit: Payer: 59

## 2020-11-13 ENCOUNTER — Encounter: Payer: 59 | Admitting: Family Medicine

## 2020-11-15 ENCOUNTER — Other Ambulatory Visit: Payer: Self-pay

## 2020-11-15 ENCOUNTER — Other Ambulatory Visit (HOSPITAL_BASED_OUTPATIENT_CLINIC_OR_DEPARTMENT_OTHER): Payer: Self-pay

## 2020-11-15 ENCOUNTER — Ambulatory Visit (INDEPENDENT_AMBULATORY_CARE_PROVIDER_SITE_OTHER): Payer: No Typology Code available for payment source | Admitting: Family Medicine

## 2020-11-15 ENCOUNTER — Encounter: Payer: Self-pay | Admitting: Family Medicine

## 2020-11-15 VITALS — BP 108/70 | HR 70 | Temp 98.1°F | Resp 18 | Ht 67.0 in | Wt 246.8 lb

## 2020-11-15 DIAGNOSIS — R7303 Prediabetes: Secondary | ICD-10-CM | POA: Diagnosis not present

## 2020-11-15 DIAGNOSIS — I10 Essential (primary) hypertension: Secondary | ICD-10-CM

## 2020-11-15 DIAGNOSIS — F3342 Major depressive disorder, recurrent, in full remission: Secondary | ICD-10-CM | POA: Diagnosis not present

## 2020-11-15 DIAGNOSIS — Z0000 Encounter for general adult medical examination without abnormal findings: Secondary | ICD-10-CM | POA: Diagnosis not present

## 2020-11-15 DIAGNOSIS — Z23 Encounter for immunization: Secondary | ICD-10-CM

## 2020-11-15 DIAGNOSIS — E785 Hyperlipidemia, unspecified: Secondary | ICD-10-CM | POA: Diagnosis not present

## 2020-11-15 LAB — CBC WITH DIFFERENTIAL/PLATELET
Basophils Absolute: 0 10*3/uL (ref 0.0–0.1)
Basophils Relative: 0.6 % (ref 0.0–3.0)
Eosinophils Absolute: 0.1 10*3/uL (ref 0.0–0.7)
Eosinophils Relative: 1.4 % (ref 0.0–5.0)
HCT: 37.7 % (ref 36.0–46.0)
Hemoglobin: 12 g/dL (ref 12.0–15.0)
Lymphocytes Relative: 23.9 % (ref 12.0–46.0)
Lymphs Abs: 1.7 10*3/uL (ref 0.7–4.0)
MCHC: 31.9 g/dL (ref 30.0–36.0)
MCV: 83.4 fl (ref 78.0–100.0)
Monocytes Absolute: 0.5 10*3/uL (ref 0.1–1.0)
Monocytes Relative: 6.3 % (ref 3.0–12.0)
Neutro Abs: 5 10*3/uL (ref 1.4–7.7)
Neutrophils Relative %: 67.8 % (ref 43.0–77.0)
Platelets: 270 10*3/uL (ref 150.0–400.0)
RBC: 4.53 Mil/uL (ref 3.87–5.11)
RDW: 15.7 % — ABNORMAL HIGH (ref 11.5–15.5)
WBC: 7.3 10*3/uL (ref 4.0–10.5)

## 2020-11-15 LAB — COMPREHENSIVE METABOLIC PANEL
ALT: 19 U/L (ref 0–35)
AST: 16 U/L (ref 0–37)
Albumin: 3.9 g/dL (ref 3.5–5.2)
Alkaline Phosphatase: 133 U/L — ABNORMAL HIGH (ref 39–117)
BUN: 15 mg/dL (ref 6–23)
CO2: 27 mEq/L (ref 19–32)
Calcium: 9.5 mg/dL (ref 8.4–10.5)
Chloride: 107 mEq/L (ref 96–112)
Creatinine, Ser: 1.4 mg/dL — ABNORMAL HIGH (ref 0.40–1.20)
GFR: 42.35 mL/min — ABNORMAL LOW (ref >60.00)
Glucose, Bld: 81 mg/dL (ref 70–99)
Potassium: 4.2 mEq/L (ref 3.5–5.1)
Sodium: 141 mEq/L (ref 135–145)
Total Bilirubin: 0.7 mg/dL (ref 0.2–1.2)
Total Protein: 6.6 g/dL (ref 6.0–8.3)

## 2020-11-15 LAB — LIPID PANEL
Cholesterol: 141 mg/dL (ref 0–200)
HDL: 45.6 mg/dL (ref >39.00)
LDL Cholesterol: 80 mg/dL (ref 0–99)
NonHDL: 95.28
Total CHOL/HDL Ratio: 3
Triglycerides: 78 mg/dL (ref 0.0–149.0)
VLDL: 15.6 mg/dL (ref 0.0–40.0)

## 2020-11-15 LAB — HEMOGLOBIN A1C: Hgb A1c MFr Bld: 5.7 % (ref 4.6–6.5)

## 2020-11-15 LAB — TSH: TSH: 1.99 u[IU]/mL (ref 0.35–5.50)

## 2020-11-15 MED ORDER — LABETALOL HCL 100 MG PO TABS
100.0000 mg | ORAL_TABLET | Freq: Two times a day (BID) | ORAL | 3 refills | Status: DC
  Filled 2020-11-15 – 2021-04-16 (×2): qty 180, 90d supply, fill #0
  Filled 2021-04-16 – 2021-06-30 (×2): qty 180, 90d supply, fill #1
  Filled 2021-08-15 – 2021-10-14 (×2): qty 180, 90d supply, fill #2

## 2020-11-15 MED ORDER — ATORVASTATIN CALCIUM 20 MG PO TABS
20.0000 mg | ORAL_TABLET | Freq: Every day | ORAL | 3 refills | Status: DC
  Filled 2020-11-15 – 2021-04-16 (×2): qty 90, 90d supply, fill #0
  Filled 2021-08-15: qty 90, 90d supply, fill #1
  Filled 2021-11-12: qty 90, 90d supply, fill #2

## 2020-11-15 MED ORDER — SPIRONOLACTONE 25 MG PO TABS
25.0000 mg | ORAL_TABLET | Freq: Two times a day (BID) | ORAL | 3 refills | Status: DC
  Filled 2020-11-15: qty 180, 90d supply, fill #0
  Filled 2021-02-25: qty 180, 90d supply, fill #1
  Filled 2021-04-25 – 2021-06-30 (×2): qty 180, 90d supply, fill #2
  Filled 2021-08-15 – 2021-09-29 (×2): qty 180, 90d supply, fill #3

## 2020-11-15 MED ORDER — BUPROPION HCL ER (XL) 300 MG PO TB24
300.0000 mg | ORAL_TABLET | Freq: Every morning | ORAL | 0 refills | Status: DC
  Filled 2020-11-15: qty 90, 90d supply, fill #0

## 2020-11-15 NOTE — Assessment & Plan Note (Signed)
Check labs 

## 2020-11-15 NOTE — Progress Notes (Signed)
Subjective:   By signing my name below, I, [name], attest that this documentation has been prepared under the direction and in the presence of Dr. Roma Schanz, DO. 11/15/2020    Patient ID: Cheryl Owens, female    DOB: 1965-05-01, 55 y.o.   MRN: [id number]  Chief Complaint  Patient presents with   Annual Exam    Pt states fasting     HPI Patient is in today for a comprehensive physical exam.  Her blood pressure is doing well during this visit. She continues taking 25 mg aldactone daily PO, 100 mg labetalol daily Po and reports no new issues while taking them.   BP Readings from Last 3 Encounters:  11/15/20 108/70  08/02/20 110/70  01/18/20 120/80   Pulse Readings from Last 3 Encounters:  11/15/20 70  08/02/20 81  11/29/19 71   She continues taking 500 mg metformin daily PO. She reports having diarrhea when first taking it but found it improved on its own. She continues seeing a nephrologist and reports having a low GFR level during her last visit with them. She has an upcomming renal ultra sound appointment next week.   Lab Results  Component Value Date   HGBA1C 6.1 08/09/2020   She denies having any fever, ear pain, congestion, sinus pain, sore throat, eye pain, chest pain, palpations, cough, SOB, wheezing, n/v/d, constipation, blood in stool, dysuria, frequency, hematuria, or headaches at this time. She has no recent changes to her family medical history. She has no recent surgical procedures.  She continues seeing her GYN specialist at this time. She is due for a mammogram but has an upcomming appointment in November, 2022.  She does not regularly exercise at this time. She stopped attending a healthy weight and wellness program but is planning to start attending again.  She continues seeing her psychiatry specialist at this time. She reports getting a new job and finding relief in her anxiety due to the lower stress from her workload.   She is due for the  tetanus vaccine and is willing to get it during this visit. She has 3 pfizer Covid-19 vaccines at this time. She is willing to get the new Covid-19 booster vaccine after it releases in the fall season. She is interested in getting the flu vaccine during this visit. She is UTD on shingles vaccines.  She is UTD on vision care. She is UTD on dental care. She reports being in the process of getting a dental implant.    Health Maintenance Due  Topic Date Due   MAMMOGRAM  09/28/2020    Past Medical History:  Diagnosis Date   Anxiety    Cellulitis    diagnosed Sept.7 2021, left upper  posterior arm,  right upper posterior arm   Depression    Dry mouth    Fibroids    Gallbladder disease    High cholesterol    HTN (hypertension) 2007   seen in ER  with headaches   Hypertension    Hypokalemia 2007   due to HCTZ   Nervousness    Obesity    Pre-diabetes    Stress    Swelling of lower extremity    Trouble in sleeping     Past Surgical History:  Procedure Laterality Date   arm surgery     post trauma @ age 45   BREAST SURGERY Right 2016   BREAST BX    CHOLECYSTECTOMY     gall stones  GANGLION CYST EXCISION N/A    wrist   ROBOTIC ASSISTED TOTAL HYSTERECTOMY WITH BILATERAL SALPINGO OOPHERECTOMY Bilateral 11/28/2019   Procedure: XI ROBOTIC ASSISTED TOTAL LAPAROSCOPIC HYSTERECTOMY WITH BILATERAL SALPINGO OOPHORECTOMY;  Surgeon: Princess Bruins, MD;  Location: [facility];  Service: Gynecology;  Laterality: Bilateral;  request 2 1/2 hours OR time request 8:30am OR start in [place] block   WISDOM TOOTH EXTRACTION      Family History  Problem Relation Age of Onset   Cancer Maternal Grandmother        thyroid   Hypertension Maternal Grandmother    Hyperlipidemia Mother    Hypertension Mother    Depression Mother    Anxiety disorder Mother    Obesity Mother    Breast cancer Maternal Aunt 92   Kidney disease Maternal Aunt         X 2; 1 on Dialysis    Cancer Maternal Aunt        ? primary; also had HTN   Prostate cancer Paternal Uncle          3 uncles   Prostate cancer Father    Cancer Father    Hypertension Paternal Grandmother    Hypertension Maternal Aunt        X 2   Bipolar disorder Paternal Aunt    Stroke Neg Hx    Diabetes Neg Hx    Heart disease Neg Hx    Colon cancer Neg Hx     Social History   Socioeconomic History   Marital status: Single    Spouse name: Not on file   Number of children: 1   Years of education: Not on file   Highest education level: Not on file  Occupational History   Occupation: Cabin crew  Tobacco Use   Smoking status: Never   Smokeless tobacco: Never  Vaping Use   Vaping Use: Never used  Substance and Sexual Activity   Alcohol use: No   Drug use: No   Sexual activity: Not Currently    Comment: 1st intercourse- 45, partners- 15  Other Topics Concern   Not on file  Social History Narrative   Not on file   Social Determinants of Health   Financial Resource Strain: Not on file  Food Insecurity: Not on file  Transportation Needs: Not on file  Physical Activity: Not on file  Stress: Not on file  Social Connections: Not on file  Intimate Partner Violence: Not on file    Outpatient Medications Prior to Visit  Medication Sig Dispense Refill   clonazePAM (KLONOPIN) 0.5 MG tablet TAKE ONE-HALF (1/2) TABLET IN THE MORNING AND 2 TABLETS AT BEDTIME 225 tablet 1   escitalopram (LEXAPRO) 10 MG tablet Take 1 tablet (10 mg total) by mouth daily. 90 tablet 1   metFORMIN (GLUCOPHAGE XR) 500 MG 24 hr tablet Take 1 tablet (500 mg total) by mouth daily with breakfast. 30 tablet 2   Vitamin D, Ergocalciferol, (DRISDOL) 1.25 MG (50000 UNIT) CAPS capsule Take 1 capsule (50,000 Units total) by mouth every 7 (seven) days. 12 capsule 1   atorvastatin (LIPITOR) 20 MG tablet Take 1 tablet (20 mg total) by mouth daily. 90 tablet 3   buPROPion (WELLBUTRIN XL) 300 MG 24 hr tablet Take 1 tablet (300 mg  total) by mouth every morning. 90 tablet 0   labetalol (NORMODYNE) 100 MG tablet Take 1 tablet (100 mg total) by mouth 2 (two) times daily. 180 tablet 3   spironolactone (ALDACTONE) 25  MG tablet Take 1 tablet (25 mg total) by mouth 2 (two) times daily. 180 tablet 3   No facility-administered medications prior to visit.    Allergies  Allergen Reactions   Hydrochlorothiazide     REACTION: hypokalemia   Benazepril Nausea And Vomiting    abd cramps    Review of Systems  Constitutional:  Negative for fever.  HENT:  Negative for congestion, ear pain, sinus pain and sore throat.   Eyes:  Negative for pain.  Respiratory:  Negative for cough, shortness of breath and wheezing.   Cardiovascular:  Negative for chest pain and palpitations.  Gastrointestinal:  Negative for blood in stool, constipation, diarrhea, nausea and vomiting.  Genitourinary:  Negative for dysuria, frequency and hematuria.  Neurological:  Negative for headaches.      Objective:    Physical Exam Constitutional:      General: She is not in acute distress.    Appearance: Normal appearance. She is not ill-appearing.  HENT:     Head: Normocephalic and atraumatic.     Right Ear: Tympanic membrane, ear canal and external ear normal.     Left Ear: Tympanic membrane, ear canal and external ear normal.  Eyes:     Extraocular Movements: Extraocular movements intact.     Pupils: Pupils are equal, round, and reactive to light.  Cardiovascular:     Rate and Rhythm: Normal rate and regular rhythm.     Heart sounds: Normal heart sounds. No murmur heard.   No gallop.  Pulmonary:     Effort: Pulmonary effort is normal. No respiratory distress.     Breath sounds: Normal breath sounds. No wheezing or rales.  Abdominal:     General: Bowel sounds are normal. There is no distension.     Palpations: Abdomen is soft. There is no mass.     Tenderness: There is no abdominal tenderness. There is no guarding or rebound.  Skin:     General: Skin is warm and dry.  Neurological:     Mental Status: She is alert and oriented to person, place, and time.  Psychiatric:        Behavior: Behavior normal.    BP 108/70 (BP Location: Right Arm, Patient Position: Sitting, Cuff Size: Large)   Pulse 70   Temp 98.1 F (36.7 C) (Oral)   Resp 18   Ht '5\' 7"'$  (1.702 m)   Wt 246 lb 12.8 oz (111.9 kg)   LMP 12/01/2018 (Approximate)   SpO2 97%   BMI 38.65 kg/m  Wt Readings from Last 3 Encounters:  11/15/20 246 lb 12.8 oz (111.9 kg)  08/02/20 248 lb 3.2 oz (112.6 kg)  11/28/19 245 lb 9.6 oz (111.4 kg)    Diabetic Foot Exam - Simple   No data filed    Lab Results  Component Value Date   WBC 19.8 (H) 11/28/2019   HGB 11.5 (L) 11/28/2019   HCT 35.8 (L) 11/28/2019   PLT 275 11/28/2019   GLUCOSE 100 (H) 08/09/2020   CHOL 144 08/09/2020   TRIG 75.0 08/09/2020   HDL 44.80 08/09/2020   LDLCALC 84 08/09/2020   ALT 16 08/09/2020   AST 16 08/09/2020   NA 141 08/09/2020   K 4.3 08/09/2020   CL 107 08/09/2020   CREATININE 1.42 (H) 08/09/2020   BUN 16 08/09/2020   CO2 23 08/09/2020   TSH 2.77 11/07/2019   HGBA1C 6.1 08/09/2020   MICROALBUR 3.1 (H) 05/05/2007    Lab Results  Component Value Date  TSH 2.77 11/07/2019   Lab Results  Component Value Date   WBC 19.8 (H) 11/28/2019   HGB 11.5 (L) 11/28/2019   HCT 35.8 (L) 11/28/2019   MCV 87.7 11/28/2019   PLT 275 11/28/2019   Lab Results  Component Value Date   NA 141 08/09/2020   K 4.3 08/09/2020   CO2 23 08/09/2020   GLUCOSE 100 (H) 08/09/2020   BUN 16 08/09/2020   CREATININE 1.42 (H) 08/09/2020   BILITOT 0.5 08/09/2020   ALKPHOS 147 (H) 08/09/2020   AST 16 08/09/2020   ALT 16 08/09/2020   PROT 6.8 08/09/2020   ALBUMIN 4.0 08/09/2020   CALCIUM 9.6 08/09/2020   ANIONGAP 9 11/18/2019   GFR 41.71 (L) 08/09/2020   Lab Results  Component Value Date   CHOL 144 08/09/2020   Lab Results  Component Value Date   HDL 44.80 08/09/2020   Lab Results   Component Value Date   LDLCALC 84 08/09/2020   Lab Results  Component Value Date   TRIG 75.0 08/09/2020   Lab Results  Component Value Date   CHOLHDL 3 08/09/2020   Lab Results  Component Value Date   HGBA1C 6.1 08/09/2020       Assessment & Plan:   Problem List Items Addressed This Visit       Unprioritized   Essential hypertension    Well controlled, no changes to meds. Encouraged heart healthy diet such as the DASH diet and exercise as tolerated.       Relevant Medications   atorvastatin (LIPITOR) 20 MG tablet   spironolactone (ALDACTONE) 25 MG tablet   labetalol (NORMODYNE) 100 MG tablet   Hyperlipidemia LDL goal <100    Encourage heart healthy diet such as MIND or DASH diet, increase exercise, avoid trans fats, simple carbohydrates and processed foods, consider a krill or fish or flaxseed oil cap daily.       Relevant Medications   atorvastatin (LIPITOR) 20 MG tablet   spironolactone (ALDACTONE) 25 MG tablet   labetalol (NORMODYNE) 100 MG tablet   Other Relevant Orders   Lipid panel   Comprehensive metabolic panel   Prediabetes    Check labs        Relevant Orders   Comprehensive metabolic panel   Hemoglobin A1c   Preventative health care - Primary    ghm utd Check labs  See AVS      Relevant Orders   CBC with Differential/Platelet   TSH   Hemoglobin A1c   Insulin, random   Other Visit Diagnoses     Depression, major, recurrent, in complete remission (HCC)       Relevant Medications   buPROPion (WELLBUTRIN XL) 300 MG 24 hr tablet   Need for tetanus booster       Relevant Orders   Tdap vaccine greater than or equal to 7yo IM (Completed)   Need for influenza vaccination       Relevant Orders   Flu Vaccine QUAD 7moIM (Fluarix, Fluzone & Alfiuria Quad PF) (Completed)        Meds ordered this encounter  Medications   atorvastatin (LIPITOR) 20 MG tablet    Sig: Take 1 tablet (20 mg total) by mouth daily.    Dispense:  90 tablet     Refill:  3   spironolactone (ALDACTONE) 25 MG tablet    Sig: Take 1 tablet (25 mg total) by mouth 2 (two) times daily.    Dispense:  180 tablet    Refill:  3   labetalol (NORMODYNE) 100 MG tablet    Sig: Take 1 tablet (100 mg total) by mouth 2 (two) times daily.    Dispense:  180 tablet    Refill:  3   buPROPion (WELLBUTRIN XL) 300 MG 24 hr tablet    Sig: Take 1 tablet (300 mg total) by mouth every morning.    Dispense:  90 tablet    Refill:  0    I, Dr. Roma Schanz, DO, personally preformed the services described in this documentation.  All medical record entries made by the scribe were at my direction and in my presence.  I have reviewed the chart and discharge instructions (if applicable) and agree that the record reflects my personal performance and is accurate and complete. 11/15/2020   I,[name],acting as a scribe for Ann Held, DO.,have documented all relevant documentation on the behalf of Ann Held, DO,as directed by  Ann Held, DO while in the presence of Ann Held, DO.   Ann Held, DO

## 2020-11-15 NOTE — Assessment & Plan Note (Signed)
Encourage heart healthy diet such as MIND or DASH diet, increase exercise, avoid trans fats, simple carbohydrates and processed foods, consider a krill or fish or flaxseed oil cap daily.  °

## 2020-11-15 NOTE — Assessment & Plan Note (Signed)
ghm utd Check labs See AVS 

## 2020-11-15 NOTE — Assessment & Plan Note (Signed)
Well controlled, no changes to meds. Encouraged heart healthy diet such as the DASH diet and exercise as tolerated.  °

## 2020-11-15 NOTE — Patient Instructions (Signed)
Preventive Care 40-55 Years Old, Female Preventive care refers to lifestyle choices and visits with your health care provider that can promote health and wellness. This includes: A yearly physical exam. This is also called an annual wellness visit. Regular dental and eye exams. Immunizations. Screening for certain conditions. Healthy lifestyle choices, such as: Eating a healthy diet. Getting regular exercise. Not using drugs or products that contain nicotine and tobacco. Limiting alcohol use. What can I expect for my preventive care visit? Physical exam Your health care provider will check your: Height and weight. These may be used to calculate your BMI (body mass index). BMI is a measurement that tells if you are at a healthy weight. Heart rate and blood pressure. Body temperature. Skin for abnormal spots. Counseling Your health care provider may ask you questions about your: Past medical problems. Family's medical history. Alcohol, tobacco, and drug use. Emotional well-being. Home life and relationship well-being. Sexual activity. Diet, exercise, and sleep habits. Work and work environment. Access to firearms. Method of birth control. Menstrual cycle. Pregnancy history. What immunizations do I need? Vaccines are usually given at various ages, according to a schedule. Your health care provider will recommend vaccines for you based on your age, medical history, and lifestyle or other factors, such as travel or where you work. What tests do I need? Blood tests Lipid and cholesterol levels. These may be checked every 5 years, or more often if you are over 55 years old. Hepatitis C test. Hepatitis B test. Screening Lung cancer screening. You may have this screening every year starting at age 55 if you have a 30-pack-year history of smoking and currently smoke or have quit within the past 15 years. Colorectal cancer screening. All adults should have this screening starting at  age 55 and continuing until age 75. Your health care provider may recommend screening at age 45 if you are at increased risk. You will have tests every 1-10 years, depending on your results and the type of screening test. Diabetes screening. This is done by checking your blood sugar (glucose) after you have not eaten for a while (fasting). You may have this done every 1-3 years. Mammogram. This may be done every 1-2 years. Talk with your health care provider about when you should start having regular mammograms. This may depend on whether you have a family history of breast cancer. BRCA-related cancer screening. This may be done if you have a family history of breast, ovarian, tubal, or peritoneal cancers. Pelvic exam and Pap test. This may be done every 3 years starting at age 55. Starting at age 30, this may be done every 5 years if you have a Pap test in combination with an HPV test. Other tests STD (sexually transmitted disease) testing, if you are at risk. Bone density scan. This is done to screen for osteoporosis. You may have this scan if you are at high risk for osteoporosis. Talk with your health care provider about your test results, treatment options, and if necessary, the need for more tests. Follow these instructions at home: Eating and drinking  Eat a diet that includes fresh fruits and vegetables, whole grains, lean protein, and low-fat dairy products. Take vitamin and mineral supplements as recommended by your health care provider. Do not drink alcohol if: Your health care provider tells you not to drink. You are pregnant, may be pregnant, or are planning to become pregnant. If you drink alcohol: Limit how much you have to 0-1 drink a day. Be   aware of how much alcohol is in your drink. In the [place] one drink equals one 12 oz bottle of beer (355 mL), one 5 oz glass of wine (148 mL), or one 1 oz glass of hard liquor (44 mL). Lifestyle Take daily care of your teeth and  gums. Brush your teeth every morning and night with fluoride toothpaste. Floss one time each day. Stay active. Exercise for at least 30 minutes 5 or more days each week. Do not use any products that contain nicotine or tobacco, such as cigarettes, e-cigarettes, and chewing tobacco. If you need help quitting, ask your health care provider. Do not use drugs. If you are sexually active, practice safe sex. Use a condom or other form of protection to prevent STIs (sexually transmitted infections). If you do not wish to become pregnant, use a form of birth control. If you plan to become pregnant, see your health care provider for a prepregnancy visit. If told by your health care provider, take low-dose aspirin daily starting at age 55. Find healthy ways to cope with stress, such as: Meditation, yoga, or listening to music. Journaling. Talking to a trusted person. Spending time with friends and family. Safety Always wear your seat belt while driving or riding in a vehicle. Do not drive: If you have been drinking alcohol. Do not ride with someone who has been drinking. When you are tired or distracted. While texting. Wear a helmet and other protective equipment during sports activities. If you have firearms in your house, make sure you follow all gun safety procedures. What's next? Visit your health care provider once a year for an annual wellness visit. Ask your health care provider how often you should have your eyes and teeth checked. Stay up to date on all vaccines. This information is not intended to replace advice given to you by your health care provider. Make sure you discuss any questions you have with your health care provider. Document Revised: 05/04/2020 Document Reviewed: 11/05/2017 Elsevier Patient Education  2022 [employer].

## 2020-11-16 LAB — INSULIN, RANDOM: Insulin: 14.2 u[IU]/mL

## 2020-11-19 ENCOUNTER — Encounter: Payer: Self-pay | Admitting: Family Medicine

## 2020-11-19 DIAGNOSIS — Z1231 Encounter for screening mammogram for malignant neoplasm of breast: Secondary | ICD-10-CM

## 2020-11-20 ENCOUNTER — Telehealth: Payer: Self-pay

## 2020-11-20 ENCOUNTER — Other Ambulatory Visit (HOSPITAL_BASED_OUTPATIENT_CLINIC_OR_DEPARTMENT_OTHER): Payer: Self-pay

## 2020-11-20 ENCOUNTER — Other Ambulatory Visit: Payer: Self-pay | Admitting: Family Medicine

## 2020-11-20 MED ORDER — WEGOVY 0.25 MG/0.5ML ~~LOC~~ SOAJ
0.2500 mg | SUBCUTANEOUS | 2 refills | Status: DC
  Filled 2020-11-20: qty 2, 28d supply, fill #0

## 2020-11-20 NOTE — Telephone Encounter (Signed)
[facility] [place] (Key: [id number]) Rx #: [id number] 0.'25MG'$ /0.5ML auto-injectors   Form [employer] [facility] [place] [phone] phone [phone] fax

## 2020-11-21 ENCOUNTER — Ambulatory Visit
Admission: RE | Admit: 2020-11-21 | Discharge: 2020-11-21 | Disposition: A | Discharge status: Home / Self Care | Payer: No Typology Code available for payment source | Source: Ambulatory Visit | Attending: Internal Medicine | Admitting: Internal Medicine

## 2020-11-21 ENCOUNTER — Other Ambulatory Visit (HOSPITAL_BASED_OUTPATIENT_CLINIC_OR_DEPARTMENT_OTHER): Payer: Self-pay

## 2020-11-21 DIAGNOSIS — N1831 Chronic kidney disease, stage 3a: Secondary | ICD-10-CM

## 2020-11-21 NOTE — Telephone Encounter (Signed)
Your prior authorization for Cheryl Owens has been approved!

## 2020-11-22 ENCOUNTER — Other Ambulatory Visit: Payer: Self-pay | Admitting: Obstetrics & Gynecology

## 2020-11-22 ENCOUNTER — Other Ambulatory Visit (HOSPITAL_BASED_OUTPATIENT_CLINIC_OR_DEPARTMENT_OTHER): Payer: Self-pay

## 2020-11-22 DIAGNOSIS — Z1231 Encounter for screening mammogram for malignant neoplasm of breast: Secondary | ICD-10-CM

## 2020-11-26 ENCOUNTER — Encounter: Payer: Self-pay | Admitting: Family Medicine

## 2020-11-26 DIAGNOSIS — E559 Vitamin D deficiency, unspecified: Secondary | ICD-10-CM

## 2020-11-27 ENCOUNTER — Other Ambulatory Visit (HOSPITAL_BASED_OUTPATIENT_CLINIC_OR_DEPARTMENT_OTHER): Payer: Self-pay

## 2020-11-27 MED ORDER — VITAMIN D (ERGOCALCIFEROL) 1.25 MG (50000 UNIT) PO CAPS
50000.0000 [IU] | ORAL_CAPSULE | ORAL | 0 refills | Status: DC
  Filled 2020-11-27: qty 12, 84d supply, fill #0

## 2020-11-27 MED ORDER — METFORMIN HCL ER 500 MG PO TB24
500.0000 mg | ORAL_TABLET | Freq: Every day | ORAL | 1 refills | Status: DC
  Filled 2020-11-27: qty 90, 90d supply, fill #0
  Filled 2021-02-25: qty 90, 90d supply, fill #1

## 2020-11-28 ENCOUNTER — Encounter: Payer: Self-pay | Admitting: Family Medicine

## 2020-12-13 ENCOUNTER — Telehealth: Payer: Self-pay | Admitting: *Deleted

## 2020-12-13 NOTE — Telephone Encounter (Signed)
Caller Name McNeal Phone Number [phone] Patient Name Cheryl Owens Patient DOB Aug 02, 1965 Call Type Message Only Information Provided Reason for Call Request to [facility] Appointment Initial Comment Caller states she an [phone] appointment tomorrow and she needs to cancel. Patient request to speak to RN No Additional Comment Declined triage. Disp. Time Disposition Final User 12/13/2020 8:04:36 AM General Information Provided Yes Linda Hedges, Ignacia Palma

## 2020-12-13 NOTE — Telephone Encounter (Signed)
Tried to call pt but had to leave a message.  I advised that we will cancel appt, but appt was for chest pain.  Advised on machine that if she has not been seen yet to make sure that she get seen today.

## 2020-12-14 ENCOUNTER — Telehealth: Payer: No Typology Code available for payment source | Admitting: Family Medicine

## 2020-12-25 ENCOUNTER — Other Ambulatory Visit: Payer: Self-pay

## 2020-12-25 ENCOUNTER — Ambulatory Visit (INDEPENDENT_AMBULATORY_CARE_PROVIDER_SITE_OTHER): Payer: No Typology Code available for payment source | Admitting: Family Medicine

## 2020-12-25 ENCOUNTER — Encounter: Payer: Self-pay | Admitting: Family Medicine

## 2020-12-25 VITALS — BP 110/70 | HR 68 | Temp 98.1°F | Ht 67.0 in | Wt 248.6 lb

## 2020-12-25 DIAGNOSIS — R079 Chest pain, unspecified: Secondary | ICD-10-CM | POA: Diagnosis not present

## 2020-12-25 DIAGNOSIS — R1310 Dysphagia, unspecified: Secondary | ICD-10-CM | POA: Diagnosis not present

## 2020-12-25 MED ORDER — OMEPRAZOLE 20 MG PO CPDR
20.0000 mg | DELAYED_RELEASE_CAPSULE | Freq: Every day | ORAL | 3 refills | Status: DC
Start: 2020-12-25 — End: 2022-02-24
  Filled 2020-12-25 – 2021-04-01 (×4): qty 30, 30d supply, fill #0
  Filled 2021-05-27: qty 30, 30d supply, fill #1

## 2020-12-25 NOTE — Progress Notes (Addendum)
Subjective:   By signing my name below, I, [name], attest that this documentation has been prepared under the direction and in the presence of Dr. Roma Schanz, DO. 12/25/2020    Patient ID: Cheryl Owens, female    DOB: 07/05/1965, 55 y.o.   MRN: [id number]  Chief Complaint  Patient presents with   Chest Pain    In between her breast bone area Going on a month   hurts when swallowing    Off and on with no shortness of breath     Chest Pain  Pertinent negatives include no abdominal pain, dizziness, fever, headaches, malaise/fatigue, nausea, palpitations or shortness of breath.  Patient is in today for a office visit.  She complains of occasionally having difficulty swallowing solids and liquids for the past month. She also notes feeling like food is stuck in her chest when having an episode. She also notes when having pain she feels increased pressure around her upper abdomen. She notes that her symptoms started around the same times she started taking pro-biotics. She denies having any shortness of breath at this time. She is interested in getting an x-ray.    Past Medical History:  Diagnosis Date   Anxiety    Cellulitis    diagnosed Sept.7 2021, left upper  posterior arm,  right upper posterior arm   Depression    Dry mouth    Fibroids    Gallbladder disease    High cholesterol    HTN (hypertension) 2007   seen in ER  with headaches   Hypertension    Hypokalemia 2007   due to HCTZ   Nervousness    Obesity    Pre-diabetes    Stress    Swelling of lower extremity    Trouble in sleeping     Past Surgical History:  Procedure Laterality Date   arm surgery     post trauma @ age 76   BREAST SURGERY Right 2016   BREAST BX    CHOLECYSTECTOMY     gall stones   GANGLION CYST EXCISION N/A    wrist   ROBOTIC ASSISTED TOTAL HYSTERECTOMY WITH BILATERAL SALPINGO OOPHERECTOMY Bilateral 11/28/2019   Procedure: XI ROBOTIC ASSISTED TOTAL LAPAROSCOPIC HYSTERECTOMY  WITH BILATERAL SALPINGO OOPHORECTOMY;  Surgeon: Princess Bruins, MD;  Location: [facility] [facility];  Service: Gynecology;  Laterality: Bilateral;  request 2 1/2 hours OR time request 8:30am OR start in [facility]   WISDOM TOOTH EXTRACTION      Family History  Problem Relation Age of Onset   Cancer Maternal Grandmother        thyroid   Hypertension Maternal Grandmother    Hyperlipidemia Mother    Hypertension Mother    Depression Mother    Anxiety disorder Mother    Obesity Mother    Breast cancer Maternal Aunt 47   Kidney disease Maternal Aunt         X 2; 1 on Dialysis   Cancer Maternal Aunt        ? primary; also had HTN   Prostate cancer Paternal Uncle          3 uncles   Prostate cancer Father    Cancer Father    Hypertension Paternal Grandmother    Hypertension Maternal Aunt        X 2   Bipolar disorder Paternal Aunt    Stroke Neg Hx    Diabetes Neg Hx    Heart disease Neg Hx  Colon cancer Neg Hx     Social History   Socioeconomic History   Marital status: Single    Spouse name: Not on file   Number of children: 1   Years of education: Not on file   Highest education level: Not on file  Occupational History   Occupation: Cabin crew  Tobacco Use   Smoking status: Never   Smokeless tobacco: Never  Vaping Use   Vaping Use: Never used  Substance and Sexual Activity   Alcohol use: No   Drug use: No   Sexual activity: Not Currently    Comment: 1st intercourse- 80, partners- 73  Other Topics Concern   Not on file  Social History Narrative   Not on file   Social Determinants of Health   Financial Resource Strain: Not on file  Food Insecurity: Not on file  Transportation Needs: Not on file  Physical Activity: Not on file  Stress: Not on file  Social Connections: Not on file  Intimate Partner Violence: Not on file    Outpatient Medications Prior to Visit  Medication Sig Dispense Refill   atorvastatin (LIPITOR) 20 MG tablet  Take 1 tablet (20 mg total) by mouth daily. 90 tablet 3   buPROPion (WELLBUTRIN XL) 300 MG 24 hr tablet Take 1 tablet (300 mg total) by mouth every morning. 90 tablet 0   clonazePAM (KLONOPIN) 0.5 MG tablet TAKE ONE-HALF (1/2) TABLET IN THE MORNING AND 2 TABLETS AT BEDTIME 225 tablet 1   escitalopram (LEXAPRO) 10 MG tablet Take 1 tablet (10 mg total) by mouth daily. 90 tablet 1   labetalol (NORMODYNE) 100 MG tablet Take 1 tablet (100 mg total) by mouth 2 (two) times daily. 180 tablet 3   metFORMIN (GLUCOPHAGE XR) 500 MG 24 hr tablet Take 1 tablet (500 mg total) by mouth daily with breakfast. 90 tablet 1   spironolactone (ALDACTONE) 25 MG tablet Take 1 tablet (25 mg total) by mouth 2 (two) times daily. 180 tablet 3   Vitamin D, Ergocalciferol, (DRISDOL) 1.25 MG (50000 UNIT) CAPS capsule Take 1 capsule (50,000 Units total) by mouth every 7 (seven) days. 12 capsule 0   Semaglutide-Weight Management (WEGOVY) 0.25 MG/0.5ML SOAJ Inject 0.25 mg into the skin once a week. (Patient not taking: Reported on 12/25/2020) 2 mL 2   No facility-administered medications prior to visit.    Allergies  Allergen Reactions   Hydrochlorothiazide     REACTION: hypokalemia   Benazepril Nausea And Vomiting    abd cramps    Review of Systems  Constitutional:  Negative for fever and malaise/fatigue.  HENT:  Negative for congestion.   Eyes:  Negative for blurred vision.  Respiratory:  Negative for shortness of breath.   Cardiovascular:  Negative for chest pain, palpitations and leg swelling.  Gastrointestinal:  Negative for abdominal pain, blood in stool and nausea.       (+)dysphagia  Genitourinary:  Negative for dysuria and frequency.  Musculoskeletal:  Negative for falls.  Skin:  Negative for rash.  Neurological:  Negative for dizziness, loss of consciousness and headaches.  Endo/Heme/Allergies:  Negative for environmental allergies.  Psychiatric/Behavioral:  Negative for depression. The patient is not  nervous/anxious.       Objective:    Physical Exam Vitals and nursing note reviewed.  Constitutional:      General: She is not in acute distress.    Appearance: Normal appearance. She is not ill-appearing.  HENT:     Head: Normocephalic and atraumatic.  Right Ear: External ear normal.     Left Ear: External ear normal.  Eyes:     Extraocular Movements: Extraocular movements intact.     Pupils: Pupils are equal, round, and reactive to light.  Cardiovascular:     Rate and Rhythm: Normal rate and regular rhythm.     Heart sounds: Normal heart sounds. No murmur heard.   No gallop.  Pulmonary:     Effort: Pulmonary effort is normal. No respiratory distress.     Breath sounds: Normal breath sounds. No wheezing or rales.  Abdominal:     Palpations: Abdomen is soft.     Tenderness: There is no abdominal tenderness. There is no guarding or rebound.  Skin:    General: Skin is warm and dry.  Neurological:     Mental Status: She is alert and oriented to person, place, and time.  Psychiatric:        Behavior: Behavior normal.        Judgment: Judgment normal.    BP 110/70   Pulse 68   Temp 98.1 F (36.7 C) (Oral)   Ht 5\' 7"  (1.702 m)   Wt 248 lb 9.6 oz (112.8 kg)   LMP 12/01/2018 (Approximate)   SpO2 98%   BMI 38.94 kg/m  Wt Readings from Last 3 Encounters:  12/25/20 248 lb 9.6 oz (112.8 kg)  11/15/20 246 lb 12.8 oz (111.9 kg)  08/02/20 248 lb 3.2 oz (112.6 kg)    Diabetic Foot Exam - Simple   No data filed    Lab Results  Component Value Date   WBC 7.9 12/25/2020   HGB 12.5 12/25/2020   HCT 38.7 12/25/2020   PLT 275.0 12/25/2020   GLUCOSE 88 12/25/2020   CHOL 141 11/15/2020   TRIG 78.0 11/15/2020   HDL 45.60 11/15/2020   LDLCALC 80 11/15/2020   ALT 16 12/25/2020   AST 18 12/25/2020   NA 138 12/25/2020   K 4.2 12/25/2020   CL 104 12/25/2020   CREATININE 1.43 (H) 12/25/2020   BUN 17 12/25/2020   CO2 27 12/25/2020   TSH 1.99 11/15/2020   HGBA1C 5.7  11/15/2020   MICROALBUR 3.1 (H) 05/05/2007    Lab Results  Component Value Date   TSH 1.99 11/15/2020   Lab Results  Component Value Date   WBC 7.9 12/25/2020   HGB 12.5 12/25/2020   HCT 38.7 12/25/2020   MCV 84.0 12/25/2020   PLT 275.0 12/25/2020   Lab Results  Component Value Date   NA 138 12/25/2020   K 4.2 12/25/2020   CO2 27 12/25/2020   GLUCOSE 88 12/25/2020   BUN 17 12/25/2020   CREATININE 1.43 (H) 12/25/2020   BILITOT 0.4 12/25/2020   ALKPHOS 141 (H) 12/25/2020   AST 18 12/25/2020   ALT 16 12/25/2020   PROT 7.5 12/25/2020   ALBUMIN 4.4 12/25/2020   CALCIUM 9.7 12/25/2020   ANIONGAP 9 11/18/2019   GFR 41.26 (L) 12/25/2020   Lab Results  Component Value Date   CHOL 141 11/15/2020   Lab Results  Component Value Date   HDL 45.60 11/15/2020   Lab Results  Component Value Date   LDLCALC 80 11/15/2020   Lab Results  Component Value Date   TRIG 78.0 11/15/2020   Lab Results  Component Value Date   CHOLHDL 3 11/15/2020   Lab Results  Component Value Date   HGBA1C 5.7 11/15/2020       Assessment & Plan:   Problem List Items  Addressed This Visit       Unprioritized   Dysphagia - Primary    Refer to GI  PPI sent in  HO on gerd diet  ? Stricture / hiatal hernia Check labs       Relevant Medications   omeprazole (PRILOSEC) 20 MG capsule   Other Relevant Orders   CBC with Differential/Platelet (Completed)   Comprehensive metabolic panel (Completed)   H. pylori antibody, IgG (Completed)   Ambulatory referral to Gastroenterology   Other Visit Diagnoses     Chest pain, unspecified type       Relevant Orders   DG Chest 2 View        Meds ordered this encounter  Medications   omeprazole (PRILOSEC) 20 MG capsule    Sig: Take 1 capsule (20 mg total) by mouth daily.    Dispense:  30 capsule    Refill:  3    I, Dr. Roma Schanz, DO, personally preformed the services described in this documentation.  All medical record entries  made by the scribe were at my direction and in my presence.  I have reviewed the chart and discharge instructions (if applicable) and agree that the record reflects my personal performance and is accurate and complete. 12/25/2020   I,[name],acting as a scribe for Ann Held, DO.,have documented all relevant documentation on the behalf of Ann Held, DO,as directed by  Ann Held, DO while in the presence of Ann Held, DO.   Ann Held, DO

## 2020-12-25 NOTE — Patient Instructions (Signed)
Food Choices for Gastroesophageal Reflux Disease, Adult °When you have gastroesophageal reflux disease (GERD), the foods you eat and your eating habits are very important. Choosing the right foods can help ease the discomfort of GERD. Consider working with a dietitian to help you make healthy food choices. °What are tips for following this plan? °Reading food labels °Look for foods that are low in saturated fat. Foods that have less than 5% of daily value (DV) of fat and 0 g of trans fats may help with your symptoms. °Cooking °Cook foods using methods other than frying. This may include baking, steaming, grilling, or broiling. These are all methods that do not need a lot of fat for cooking. °To add flavor, try to use herbs that are low in spice and acidity. °Meal planning ° °Choose healthy foods that are low in fat, such as fruits, vegetables, whole grains, low-fat dairy products, lean meats, fish, and poultry. °Eat frequent, small meals instead of three large meals each day. Eat your meals slowly, in a relaxed setting. Avoid bending over or lying down until 2-3 hours after eating. °Limit high-fat foods such as fatty meats or fried foods. °Limit your intake of fatty foods, such as oils, butter, and shortening. °Avoid the following as told by your health care provider: °Foods that cause symptoms. These may be different for different people. Keep a food diary to keep track of foods that cause symptoms. °Alcohol. °Drinking large amounts of liquid with meals. °Eating meals during the 2-3 hours before bed. °Lifestyle °Maintain a healthy weight. Ask your health care provider what weight is healthy for you. If you need to lose weight, work with your health care provider to do so safely. °Exercise for at least 30 minutes on 5 or more days each week, or as told by your health care provider. °Avoid wearing clothes that fit tightly around your waist and chest. °Do not use any products that contain nicotine or tobacco. These  products include cigarettes, chewing tobacco, and vaping devices, such as e-cigarettes. If you need help quitting, ask your health care provider. °Sleep with the head of your bed raised. Use a wedge under the mattress or blocks under the bed frame to raise the head of the bed. °Chew sugar-free gum after mealtimes. °What foods should I eat? °Eat a healthy, well-balanced diet of fruits, vegetables, whole grains, low-fat dairy products, lean meats, fish, and poultry. Each person is different. Foods that may trigger symptoms in one person may not trigger any symptoms in another person. Work with your health care provider to identify foods that are safe for you. °The items listed above may not be a complete list of recommended foods and beverages. Contact a dietitian for more information. °What foods should I avoid? °Limiting some of these foods may help manage the symptoms of GERD. Everyone is different. Consult a dietitian or your health care provider to help you identify the exact foods to avoid, if any. °Fruits °Any fruits prepared with added fat. Any fruits that cause symptoms. For some people this may include citrus fruits, such as oranges, grapefruit, pineapple, and lemons. °Vegetables °Deep-fried vegetables. French fries. Any vegetables prepared with added fat. Any vegetables that cause symptoms. For some people, this may include tomatoes and tomato products, chili peppers, onions and garlic, and horseradish. °Grains °Pastries or quick breads with added fat. °Meats and other proteins °High-fat meats, such as fatty beef or pork, hot dogs, ribs, ham, sausage, salami, and bacon. Fried meat or protein, including fried   fish and fried chicken. Nuts and nut butters, in large amounts. °Dairy °Whole milk and chocolate milk. Sour cream. Cream. Ice cream. Cream cheese. Milkshakes. °Fats and oils °Butter. Margarine. Shortening. Ghee. °Beverages °Coffee and tea, with or without caffeine. Carbonated beverages. Sodas. Energy  drinks. Fruit juice made with acidic fruits, such as orange or grapefruit. Tomato juice. Alcoholic drinks. °Sweets and desserts °Chocolate and cocoa. Donuts. °Seasonings and condiments °Pepper. Peppermint and spearmint. Added salt. Any condiments, herbs, or seasonings that cause symptoms. For some people, this may include curry, hot sauce, or vinegar-based salad dressings. °The items listed above may not be a complete list of foods and beverages to avoid. Contact a dietitian for more information. °Questions to ask your health care provider °Diet and lifestyle changes are usually the first steps that are taken to manage symptoms of GERD. If diet and lifestyle changes do not improve your symptoms, talk with your health care provider about taking medicines. °Where to find more information °[employer] Gastrointestinal Disorders: aboutgerd.org °Summary °When you have gastroesophageal reflux disease (GERD), food and lifestyle choices may be very helpful in easing the discomfort of GERD. °Eat frequent, small meals instead of three large meals each day. Eat your meals slowly, in a relaxed setting. Avoid bending over or lying down until 2-3 hours after eating. °Limit high-fat foods such as fatty meats or fried foods. °This information is not intended to replace advice given to you by your health care provider. Make sure you discuss any questions you have with your health care provider. °Document Revised: 09/05/2019 Document Reviewed: 09/05/2019 °Elsevier Patient Education © 2022 [employer] ° °

## 2020-12-26 ENCOUNTER — Ambulatory Visit
Admission: RE | Admit: 2020-12-26 | Discharge: 2020-12-26 | Disposition: A | Discharge status: Home / Self Care | Payer: No Typology Code available for payment source | Source: Ambulatory Visit

## 2020-12-26 ENCOUNTER — Other Ambulatory Visit (HOSPITAL_BASED_OUTPATIENT_CLINIC_OR_DEPARTMENT_OTHER): Payer: Self-pay

## 2020-12-26 DIAGNOSIS — R1310 Dysphagia, unspecified: Secondary | ICD-10-CM | POA: Insufficient documentation

## 2020-12-26 DIAGNOSIS — Z1231 Encounter for screening mammogram for malignant neoplasm of breast: Secondary | ICD-10-CM

## 2020-12-26 LAB — COMPREHENSIVE METABOLIC PANEL
ALT: 16 U/L (ref 0–35)
AST: 18 U/L (ref 0–37)
Albumin: 4.4 g/dL (ref 3.5–5.2)
Alkaline Phosphatase: 141 U/L — ABNORMAL HIGH (ref 39–117)
BUN: 17 mg/dL (ref 6–23)
CO2: 27 mEq/L (ref 19–32)
Calcium: 9.7 mg/dL (ref 8.4–10.5)
Chloride: 104 mEq/L (ref 96–112)
Creatinine, Ser: 1.43 mg/dL — ABNORMAL HIGH (ref 0.40–1.20)
GFR: 41.26 mL/min — ABNORMAL LOW (ref >60.00)
Glucose, Bld: 88 mg/dL (ref 70–99)
Potassium: 4.2 mEq/L (ref 3.5–5.1)
Sodium: 138 mEq/L (ref 135–145)
Total Bilirubin: 0.4 mg/dL (ref 0.2–1.2)
Total Protein: 7.5 g/dL (ref 6.0–8.3)

## 2020-12-26 LAB — CBC WITH DIFFERENTIAL/PLATELET
Basophils Absolute: 0.1 10*3/uL (ref 0.0–0.1)
Basophils Relative: 1.1 % (ref 0.0–3.0)
Eosinophils Absolute: 0.1 10*3/uL (ref 0.0–0.7)
Eosinophils Relative: 1.5 % (ref 0.0–5.0)
HCT: 38.7 % (ref 36.0–46.0)
Hemoglobin: 12.5 g/dL (ref 12.0–15.0)
Lymphocytes Relative: 32.2 % (ref 12.0–46.0)
Lymphs Abs: 2.6 10*3/uL (ref 0.7–4.0)
MCHC: 32.3 g/dL (ref 30.0–36.0)
MCV: 84 fl (ref 78.0–100.0)
Monocytes Absolute: 0.6 10*3/uL (ref 0.1–1.0)
Monocytes Relative: 7 % (ref 3.0–12.0)
Neutro Abs: 4.6 10*3/uL (ref 1.4–7.7)
Neutrophils Relative %: 58.2 % (ref 43.0–77.0)
Platelets: 275 10*3/uL (ref 150.0–400.0)
RBC: 4.61 Mil/uL (ref 3.87–5.11)
RDW: 14.7 % (ref 11.5–15.5)
WBC: 7.9 10*3/uL (ref 4.0–10.5)

## 2020-12-26 LAB — H. PYLORI ANTIBODY, IGG: H Pylori IgG: NEGATIVE

## 2020-12-26 NOTE — Assessment & Plan Note (Signed)
Refer to GI  PPI sent in  HO on gerd diet  ? Stricture / hiatal hernia Check labs

## 2020-12-27 ENCOUNTER — Encounter: Payer: Self-pay | Admitting: Family Medicine

## 2021-01-03 ENCOUNTER — Ambulatory Visit
Admission: RE | Admit: 2021-01-03 | Discharge: 2021-01-03 | Disposition: A | Discharge status: Home / Self Care | Payer: No Typology Code available for payment source | Source: Ambulatory Visit | Attending: Family Medicine | Admitting: Family Medicine

## 2021-01-03 DIAGNOSIS — R079 Chest pain, unspecified: Secondary | ICD-10-CM

## 2021-01-07 ENCOUNTER — Other Ambulatory Visit (HOSPITAL_BASED_OUTPATIENT_CLINIC_OR_DEPARTMENT_OTHER): Payer: Self-pay

## 2021-01-08 ENCOUNTER — Encounter: Payer: Self-pay | Admitting: Family Medicine

## 2021-01-09 ENCOUNTER — Other Ambulatory Visit (HOSPITAL_COMMUNITY): Payer: Self-pay

## 2021-01-09 ENCOUNTER — Other Ambulatory Visit (HOSPITAL_BASED_OUTPATIENT_CLINIC_OR_DEPARTMENT_OTHER): Payer: Self-pay

## 2021-01-17 ENCOUNTER — Other Ambulatory Visit (HOSPITAL_COMMUNITY): Payer: Self-pay

## 2021-01-23 ENCOUNTER — Encounter: Payer: Self-pay | Admitting: Family Medicine

## 2021-01-24 NOTE — Telephone Encounter (Signed)
Pt called to follow up on this request and stated she was on the way to the office to pick up a copy of this as her employer needs proof of vaccination. I printed off immunization report for pt to pick up at front desk.

## 2021-01-29 ENCOUNTER — Other Ambulatory Visit: Payer: Self-pay

## 2021-01-29 ENCOUNTER — Ambulatory Visit: Payer: 59 | Admitting: Psychiatry

## 2021-01-29 ENCOUNTER — Ambulatory Visit: Payer: Self-pay | Admitting: Psychiatry

## 2021-01-29 ENCOUNTER — Encounter: Payer: Self-pay | Admitting: Obstetrics & Gynecology

## 2021-01-29 ENCOUNTER — Ambulatory Visit (INDEPENDENT_AMBULATORY_CARE_PROVIDER_SITE_OTHER): Payer: No Typology Code available for payment source | Admitting: Obstetrics & Gynecology

## 2021-01-29 VITALS — BP 120/76 | HR 78 | Resp 16

## 2021-01-29 DIAGNOSIS — Z90722 Acquired absence of ovaries, bilateral: Secondary | ICD-10-CM

## 2021-01-29 DIAGNOSIS — Z780 Asymptomatic menopausal state: Secondary | ICD-10-CM | POA: Diagnosis not present

## 2021-01-29 DIAGNOSIS — Z01419 Encounter for gynecological examination (general) (routine) without abnormal findings: Secondary | ICD-10-CM

## 2021-01-29 DIAGNOSIS — Z90710 Acquired absence of both cervix and uterus: Secondary | ICD-10-CM | POA: Diagnosis not present

## 2021-01-29 DIAGNOSIS — L292 Pruritus vulvae: Secondary | ICD-10-CM

## 2021-01-29 DIAGNOSIS — Z0000 Encounter for general adult medical examination without abnormal findings: Secondary | ICD-10-CM

## 2021-01-29 DIAGNOSIS — Z9079 Acquired absence of other genital organ(s): Secondary | ICD-10-CM

## 2021-01-29 LAB — URINALYSIS, COMPLETE W/RFL CULTURE
Bacteria, UA: NONE SEEN /HPF
Bilirubin Urine: NEGATIVE
Casts: NONE SEEN /LPF
Crystals: NONE SEEN /HPF
Glucose, UA: NEGATIVE
Hgb urine dipstick: NEGATIVE
Hyaline Cast: NONE SEEN /LPF
Leukocyte Esterase: NEGATIVE
Nitrites, Initial: NEGATIVE
Protein, ur: NEGATIVE
RBC / HPF: NONE SEEN /HPF (ref 0–2)
Specific Gravity, Urine: 1.025 (ref 1.001–1.035)
WBC, UA: NONE SEEN /HPF (ref 0–5)
Yeast: NONE SEEN /HPF
pH: 6 (ref 5.0–8.0)

## 2021-01-29 LAB — WET PREP FOR TRICH, YEAST, CLUE

## 2021-01-29 LAB — NO CULTURE INDICATED

## 2021-01-29 NOTE — Progress Notes (Signed)
Cheryl Owens 12/21/1965 [id number]   History:    55 y.o.   RP:  Established patient presenting for annual gyn exam   HPI: S/P XI Robotic TLH/BSO 11/28/2019. Patho benign.  No abdominopelvic pain.  Abstinent.  Breasts normal.  Screening mammo neg 12/2020.  C/O vulvar itching.  No UTI Sx.  BMs normal.  Planning a weight loss program with injections starting 03/2021.  Declined weight measurement today.  Colono 2018.    Past medical history,surgical history, family history and social history were all reviewed and documented in the EPIC chart.  Gynecologic History Patient's last menstrual period was 12/01/2018 (approximate).  Obstetric History OB History  Gravida Para Term Preterm AB Living  4 1     3 1   SAB IAB Ectopic Multiple Live Births               # Outcome Date GA Lbr Len/2nd Weight Sex Delivery Anes PTL Lv  4 AB           3 AB           2 AB           1 Para              ROS: A ROS was performed and pertinent positives and negatives are included in the history.  GENERAL: No fevers or chills. HEENT: No change in vision, no earache, sore throat or sinus congestion. NECK: No pain or stiffness. CARDIOVASCULAR: No chest pain or pressure. No palpitations. PULMONARY: No shortness of breath, cough or wheeze. GASTROINTESTINAL: No abdominal pain, nausea, vomiting or diarrhea, melena or bright red blood per rectum. GENITOURINARY: No urinary frequency, urgency, hesitancy or dysuria. MUSCULOSKELETAL: No joint or muscle pain, no back pain, no recent trauma. DERMATOLOGIC: No rash, no itching, no lesions. ENDOCRINE: No polyuria, polydipsia, no heat or cold intolerance. No recent change in weight. HEMATOLOGICAL: No anemia or easy bruising or bleeding. NEUROLOGIC: No headache, seizures, numbness, tingling or weakness. PSYCHIATRIC: No depression, no loss of interest in normal activity or change in sleep pattern.     Exam:   BP 120/76   Pulse 78   Resp 16   LMP 12/01/2018 (Approximate)    There is no height or weight on file to calculate BMI.  General appearance : Well developed well nourished female. No acute distress HEENT: Eyes: no retinal hemorrhage or exudates,  Neck supple, trachea midline, no carotid bruits, no thyroidmegaly Lungs: Clear to auscultation, no rhonchi or wheezes, or rib retractions  Heart: Regular rate and rhythm, no murmurs or gallops Breast:Examined in sitting and supine position were symmetrical in appearance, no palpable masses or tenderness,  no skin retraction, no nipple inversion, no nipple discharge, no skin discoloration, no axillary or supraclavicular lymphadenopathy Abdomen: no palpable masses or tenderness, no rebound or guarding Extremities: no edema or skin discoloration or tenderness  Pelvic: Vulva: Normal             Vagina: No gross lesions or discharge  Cervix/Uterus absent  Adnexa  Without masses or tenderness  Anus: Normal  U/A Neg Wet prep: Neg   Assessment/Plan:  55 y.o. female for annual exam   1. Well female exam with routine gynecological exam S/P XI Robotic TLH/BSO 11/28/2019. Patho benign.  No abdominopelvic pain.  Abstinent.  Breasts normal.  Screening mammo neg 12/2020.  C/O vulvar itching.  No UTI Sx.  BMs normal.  Planning a weight loss program with injections starting 03/2021.  Declined weight  measurement today.  Colono 2018.    2. S/P total hysterectomy and BSO (bilateral salpingo-oophorectomy)  3. Postmenopause Well on no HRT.  Vit D supplement, Ca++ 1.5 g/d total and regular weight bearing physical activities recommended.  4. Vulvar itching Wet prep Neg.  U/A Neg.  Mild Vulvitis of anterior vulva.  Will use Hydrocortisone 1% on anterior vulva where experiencing itching.  U/A Neg. - WET PREP FOR Calcium, YEAST, CLUE  5. Laboratory exam ordered as part of routine general medical examination - Urinalysis,Complete w/RFL Culture  Other orders - REFLEXIVE URINE CULTURE   Princess Bruins MD, 4:37 PM  01/29/2021

## 2021-01-30 ENCOUNTER — Encounter: Payer: Self-pay | Admitting: Obstetrics & Gynecology

## 2021-02-25 ENCOUNTER — Other Ambulatory Visit (HOSPITAL_COMMUNITY): Payer: Self-pay

## 2021-02-25 ENCOUNTER — Other Ambulatory Visit: Payer: Self-pay | Admitting: Psychiatry

## 2021-02-25 DIAGNOSIS — F4001 Agoraphobia with panic disorder: Secondary | ICD-10-CM

## 2021-02-25 DIAGNOSIS — F411 Generalized anxiety disorder: Secondary | ICD-10-CM

## 2021-02-25 MED ORDER — ESCITALOPRAM OXALATE 10 MG PO TABS
10.0000 mg | ORAL_TABLET | Freq: Every day | ORAL | 0 refills | Status: DC
  Filled 2021-02-25: qty 30, 30d supply, fill #0

## 2021-02-26 ENCOUNTER — Other Ambulatory Visit (HOSPITAL_COMMUNITY): Payer: Self-pay

## 2021-03-02 ENCOUNTER — Encounter: Payer: Self-pay | Admitting: Family Medicine

## 2021-03-02 DIAGNOSIS — E559 Vitamin D deficiency, unspecified: Secondary | ICD-10-CM

## 2021-03-05 ENCOUNTER — Other Ambulatory Visit (HOSPITAL_BASED_OUTPATIENT_CLINIC_OR_DEPARTMENT_OTHER): Payer: Self-pay

## 2021-03-05 MED ORDER — VITAMIN D (ERGOCALCIFEROL) 1.25 MG (50000 UNIT) PO CAPS
50000.0000 [IU] | ORAL_CAPSULE | ORAL | 2 refills | Status: DC
  Filled 2021-03-05: qty 12, 84d supply, fill #0

## 2021-03-06 ENCOUNTER — Other Ambulatory Visit (HOSPITAL_BASED_OUTPATIENT_CLINIC_OR_DEPARTMENT_OTHER): Payer: Self-pay

## 2021-03-06 MED ORDER — VITAMIN D (ERGOCALCIFEROL) 1.25 MG (50000 UNIT) PO CAPS: 50000.0000 [IU] | ORAL_CAPSULE | ORAL | 2 refills | Status: DC

## 2021-03-06 NOTE — Addendum Note (Signed)
Addended byDamita Dunnings D on: 03/06/2021 08:15 AM   Modules accepted: Orders

## 2021-03-13 ENCOUNTER — Other Ambulatory Visit: Payer: Self-pay

## 2021-03-13 DIAGNOSIS — F5105 Insomnia due to other mental disorder: Secondary | ICD-10-CM

## 2021-03-13 DIAGNOSIS — F4001 Agoraphobia with panic disorder: Secondary | ICD-10-CM

## 2021-03-13 MED ORDER — CLONAZEPAM 0.5 MG PO TABS
ORAL_TABLET | ORAL | 1 refills | Status: DC
  Filled 2021-03-13: qty 225, 90d supply, fill #0
  Filled 2021-08-15: qty 225, 90d supply, fill #1

## 2021-03-14 ENCOUNTER — Other Ambulatory Visit (HOSPITAL_COMMUNITY): Payer: Self-pay

## 2021-03-25 ENCOUNTER — Other Ambulatory Visit (HOSPITAL_COMMUNITY): Payer: Self-pay

## 2021-03-28 ENCOUNTER — Encounter: Payer: Self-pay | Admitting: Family Medicine

## 2021-03-28 ENCOUNTER — Other Ambulatory Visit: Payer: Self-pay | Admitting: Family Medicine

## 2021-03-28 ENCOUNTER — Other Ambulatory Visit (HOSPITAL_COMMUNITY): Payer: Self-pay

## 2021-03-28 MED ORDER — WEGOVY 0.25 MG/0.5ML ~~LOC~~ SOAJ
0.2500 mg | SUBCUTANEOUS | 2 refills | Status: DC
  Filled 2021-03-28 – 2021-04-16 (×2): qty 2, 28d supply, fill #0
  Filled 2021-06-30: qty 2, 28d supply, fill #1

## 2021-03-28 NOTE — Telephone Encounter (Signed)
Med no longer on med list. Okay to send?

## 2021-04-01 ENCOUNTER — Telehealth: Payer: Self-pay

## 2021-04-01 ENCOUNTER — Other Ambulatory Visit (HOSPITAL_COMMUNITY): Payer: Self-pay

## 2021-04-01 NOTE — Telephone Encounter (Signed)
PA initiated via Covermymeds; KEY: [id number]. Awaiting determination.

## 2021-04-03 ENCOUNTER — Encounter: Payer: Self-pay | Admitting: Psychiatry

## 2021-04-03 ENCOUNTER — Ambulatory Visit (INDEPENDENT_AMBULATORY_CARE_PROVIDER_SITE_OTHER): Payer: No Typology Code available for payment source | Admitting: Psychiatry

## 2021-04-03 ENCOUNTER — Other Ambulatory Visit (HOSPITAL_COMMUNITY): Payer: Self-pay

## 2021-04-03 ENCOUNTER — Other Ambulatory Visit: Payer: Self-pay

## 2021-04-03 DIAGNOSIS — F411 Generalized anxiety disorder: Secondary | ICD-10-CM

## 2021-04-03 DIAGNOSIS — F5105 Insomnia due to other mental disorder: Secondary | ICD-10-CM

## 2021-04-03 DIAGNOSIS — F4001 Agoraphobia with panic disorder: Secondary | ICD-10-CM

## 2021-04-03 DIAGNOSIS — F3342 Major depressive disorder, recurrent, in full remission: Secondary | ICD-10-CM | POA: Diagnosis not present

## 2021-04-03 MED ORDER — BUPROPION HCL ER (XL) 150 MG PO TB24
ORAL_TABLET | ORAL | 0 refills | Status: DC
  Filled 2021-04-03: qty 30, 30d supply, fill #0

## 2021-04-03 MED ORDER — ALPRAZOLAM 0.5 MG PO TABS
0.5000 mg | ORAL_TABLET | Freq: Four times a day (QID) | ORAL | 0 refills | Status: DC | PRN
  Filled 2021-04-03: qty 12, 3d supply, fill #0

## 2021-04-03 NOTE — Progress Notes (Signed)
EVEREST HACKING [id number] 07-02-1965 56 y.o.  Subjective:   Patient ID:  Cheryl Owens is a 56 y.o. (DOB 02-May-1965) female.  Chief Complaint:  Chief Complaint  Patient presents with   Follow-up   Depression   Anxiety    Depression        Associated symptoms include no decreased concentration and no suicidal ideas. Cheryl Owens presents to the office today for follow-up of depression and anxiety and history of insomnia and Bz withdrawal.    seen in June and December 2020.  No meds were changed.  She remained on Wellbutrin XL 300 mg, Lexapro 10 mg, vitamin D, and clonazepam 0.5 mg twice daily as needed.  Usually takes clonazepam 0.5 mg 1/2 AM and 1 in PM.  02/06/2020 appointment with the following noted: Still doing well overall.  Hasn't tried to reduce.  Don't want to ever go back to feeling like she did with the episode. Laid off and another person also.  Had 33 years with the company.  Got 6 mos severance pay.  Needs another job but not desperate over it.  Made money with real estate sales.  Hysterectomy went well. Overall feels ok about things but doesn't want to stop meds.  No panic. Recognizes some social anxiety.   Better with weight control.  Doing Healthy Weight and Wellness including doctor including mental health provider and counseling to help weight loss.   Work life balance is better overall. Plan: No med changes  07/31/2020 appointment with the following noted: Got laid off after 34 years and 9 mos. Hasn't found another job yet.  Would rather change fields.  Look for low stress and low responsibility. Also worried about agism.  Has interview tomorrow with [facility] physicians.  Not stretched for money.  Doesn't want to manage anyone or have too much responsibility. Emotionally things are pretty much the same.  D doing well with job.   No concerns with meds.   Patient reports stable mood and denies depressed or irritable moods.  Patient denies any recent difficulty with  anxiety.  Patient denies difficulty with sleep initiation or maintenance. 6 hours; hard to let go of phone.  Denies appetite disturbance.  Patient reports that energy and motivation have been good.  Patient denies any difficulty with concentration.  Patient denies any suicidal ideation. No panic. Hx noncompliance bc forgets.  Is using a pill box, and is aware if she forgets it. Fearful to decrease meds. Plan no changes  04/03/2021 appointment with the following noted: Feel pretty good.  Some concerns about stopping meds. Some family stress. Disc weight loss.   No panic.   Not depressed. Pending endo. Lives alone. Not sleepy during the day. But D concerned that she might have OSA.  Loud snoring. Patient reports stable mood and denies depressed or irritable moods.  Patient denies any recent difficulty with anxiety.  Patient denies difficulty with sleep initiation or maintenance. Denies appetite disturbance.  Patient reports that energy and motivation have been good.  Patient denies any difficulty with concentration.  Patient denies any suicidal ideation.   Past Psychiatric Medication Trials:  Wellbutrin, Lexapro 10 clonazepam,   Adderall,   Episode depression early life after D born and then again later.  Review of Systems:  Review of Systems  Respiratory:  Positive for chest tightness.   Cardiovascular:  Negative for chest pain and palpitations.  Neurological:  Negative for tremors and weakness.  Psychiatric/Behavioral:  Negative for agitation, behavioral problems, confusion, decreased  concentration, dysphoric mood, self-injury, sleep disturbance and suicidal ideas. The patient is not nervous/anxious and is not hyperactive.    Medications: I have reviewed the patient's current medications.  Current Outpatient Medications  Medication Sig Dispense Refill   ALPRAZolam (XANAX) 0.5 MG tablet Take 1 tablet (0.5 mg total) by mouth every 6 (six) hours as needed for anxiety (flyihg phobia). 12  tablet 0   atorvastatin (LIPITOR) 20 MG tablet Take 1 tablet (20 mg total) by mouth daily. 90 tablet 3   buPROPion (WELLBUTRIN XL) 300 MG 24 hr tablet Take 1 tablet (300 mg total) by mouth every morning. 90 tablet 0   clonazePAM (KLONOPIN) 0.5 MG tablet TAKE ONE-HALF (1/2) TABLET BY MOUTH IN THE MORNING AND 2 TABLETS AT BEDTIME (Patient taking differently: TAKE ONE-HALF (1/2) TABLET BY MOUTH IN THE MORNING AND 1 TABLETS AT BEDTIME) 225 tablet 1   escitalopram (LEXAPRO) 10 MG tablet Take 1 tablet by mouth daily. 30 tablet 0   labetalol (NORMODYNE) 100 MG tablet Take 1 tablet (100 mg total) by mouth 2 (two) times daily. 180 tablet 3   metFORMIN (GLUCOPHAGE XR) 500 MG 24 hr tablet Take 1 tablet (500 mg total) by mouth daily with breakfast. 90 tablet 1   PREBIOTIC PRODUCT PO Take by mouth.     spironolactone (ALDACTONE) 25 MG tablet Take 1 tablet (25 mg total) by mouth 2 (two) times daily. 180 tablet 3   Vitamin D, Ergocalciferol, (DRISDOL) 1.25 MG (50000 UNIT) CAPS capsule Take 1 capsule (50,000 Units total) by mouth every 7 (seven) days. 12 capsule 2   omeprazole (PRILOSEC) 20 MG capsule Take 1 capsule (20 mg total) by mouth daily. (Patient not taking: Reported on 01/29/2021) 30 capsule 3   Semaglutide-Weight Management (WEGOVY) 0.25 MG/0.5ML SOAJ Inject 0.25 mg into the skin once a week. (Patient not taking: Reported on 04/03/2021) 2 mL 2   No current facility-administered medications for this visit.    Medication Side Effects: None  Allergies:  Allergies  Allergen Reactions   Hydrochlorothiazide     REACTION: hypokalemia   Benazepril Nausea And Vomiting    abd cramps    Past Medical History:  Diagnosis Date   Anxiety    Cellulitis    diagnosed Sept.7 2021, left upper  posterior arm,  right upper posterior arm   Depression    Dry mouth    Fibroids    Gallbladder disease    High cholesterol    HTN (hypertension) 2007   seen in ER  with headaches   Hypertension    Hypokalemia 2007    due to HCTZ   Nervousness    Obesity    Pre-diabetes    Stress    Swelling of lower extremity    Trouble in sleeping     Family History  Problem Relation Age of Onset   Cancer Maternal Grandmother        thyroid   Hypertension Maternal Grandmother    Hyperlipidemia Mother    Hypertension Mother    Depression Mother    Anxiety disorder Mother    Obesity Mother    Breast cancer Maternal Aunt 72   Kidney disease Maternal Aunt         X 2; 1 on Dialysis   Cancer Maternal Aunt        ? primary; also had HTN   Prostate cancer Paternal Uncle          3 uncles   Prostate cancer Father  Cancer Father    Hypertension Paternal Grandmother    Hypertension Maternal Aunt        X 2   Bipolar disorder Paternal Aunt    Stroke Neg Hx    Diabetes Neg Hx    Heart disease Neg Hx    Colon cancer Neg Hx     Social History   Socioeconomic History   Marital status: Single    Spouse name: Not on file   Number of children: 1   Years of education: Not on file   Highest education level: Not on file  Occupational History   Occupation: Cabin crew  Tobacco Use   Smoking status: Never   Smokeless tobacco: Never  Vaping Use   Vaping Use: Never used  Substance and Sexual Activity   Alcohol use: No   Drug use: No   Sexual activity: Not Currently    Birth control/protection: Surgical    Comment: 1st intercourse- 79, partners- 31, hysterectomy  Other Topics Concern   Not on file  Social History Narrative   Not on file   Social Determinants of Health   Financial Resource Strain: Not on file  Food Insecurity: Not on file  Transportation Needs: Not on file  Physical Activity: Not on file  Stress: Not on file  Social Connections: Not on file  Intimate Partner Violence: Not on file    Past Medical History, Surgical history, Social history, and Family history were reviewed and updated as appropriate.   Please see review of systems for further details on the patient's review  from today.   Objective:   Physical Exam:  LMP 12/01/2018 (Approximate)   Physical Exam Constitutional:      General: She is not in acute distress.    Appearance: She is well-developed.  Musculoskeletal:        General: No deformity.  Neurological:     Mental Status: She is alert and oriented to person, place, and time.     Motor: No tremor.     Coordination: Coordination normal.     Gait: Gait normal.  Psychiatric:        Attention and Perception: She is attentive. She does not perceive auditory or visual hallucinations.        Mood and Affect: Mood is not anxious or depressed. Affect is not labile, blunt, angry, tearful or inappropriate.        Speech: Speech normal.        Behavior: Behavior normal.        Thought Content: Thought content normal. Thought content is not delusional. Thought content does not include homicidal or suicidal ideation. Thought content does not include suicidal plan.        Cognition and Memory: Cognition normal.        Judgment: Judgment normal.     Comments: Insight intact. No auditory or visual hallucinations. No delusions.      Lab Review:     Component Value Date/Time   NA 138 12/25/2020 1817   NA 143 12/08/2018 0820   K 4.2 12/25/2020 1817   CL 104 12/25/2020 1817   CO2 27 12/25/2020 1817   GLUCOSE 88 12/25/2020 1817   BUN 17 12/25/2020 1817   BUN 12 12/08/2018 0820   CREATININE 1.43 (H) 12/25/2020 1817   CREATININE 1.44 (H) 11/07/2019 0848   CALCIUM 9.7 12/25/2020 1817   PROT 7.5 12/25/2020 1817   PROT 6.4 12/08/2018 0820   ALBUMIN 4.4 12/25/2020 1817   ALBUMIN 4.0 12/08/2018 0820  AST 18 12/25/2020 1817   ALT 16 12/25/2020 1817   ALKPHOS 141 (H) 12/25/2020 1817   BILITOT 0.4 12/25/2020 1817   BILITOT 0.4 12/08/2018 0820   GFRNONAA 53 (L) 11/18/2019 1446   GFRAA >60 11/18/2019 1446       Component Value Date/Time   WBC 7.9 12/25/2020 1817   RBC 4.61 12/25/2020 1817   HGB 12.5 12/25/2020 1817   HGB 12.8 05/05/2018 1252    HCT 38.7 12/25/2020 1817   HCT 40.4 05/05/2018 1252   PLT 275.0 12/25/2020 1817   MCV 84.0 12/25/2020 1817   MCV 86 05/05/2018 1252   MCH 28.2 11/28/2019 1403   MCHC 32.3 12/25/2020 1817   RDW 14.7 12/25/2020 1817   RDW 13.6 05/05/2018 1252   LYMPHSABS 2.6 12/25/2020 1817   LYMPHSABS 2.1 05/05/2018 1252   MONOABS 0.6 12/25/2020 1817   EOSABS 0.1 12/25/2020 1817   EOSABS 0.1 05/05/2018 1252   BASOSABS 0.1 12/25/2020 1817   BASOSABS 0.0 05/05/2018 1252    No results found for: POCLITH, LITHIUM   No results found for: PHENYTOIN, PHENOBARB, VALPROATE, CBMZ   Corrected low vitamin D 2020  .res Assessment: Plan:    Cheryl Owens was seen today for follow-up, depression and anxiety.  Diagnoses and all orders for this visit:  Depression, major, recurrent, in complete remission ([place])  Panic disorder with agoraphobia  Generalized anxiety disorder  Insomnia due to mental condition  Other orders -     ALPRAZolam (XANAX) 0.5 MG tablet; Take 1 tablet (0.5 mg total) by mouth every 6 (six) hours as needed for anxiety (flyihg phobia).    Greater than 50% of face to face time with patient was spent on counseling and coordination of care. .  Could also help with her weight loss which is a goal.  Has gotten much better at setting boundaries with work and not overworking.  Mood is therefore better. Discussed meds and SE.   OK bz Xanax for flying phobia as needed.  Will use it for flight.  We discussed the short-term risks associated with benzodiazepines including sedation and increased fall risk among others.  Discussed long-term side effect risk including dependence, potential withdrawal symptoms, and the potential eventual dose-related risk of dementia. She feels that she is on the lowest effective dose.  .  Doing well.  Now not time to stop it. Wean and DC wellbutrin.  Call if depressed.   This appt was 30 mins.  FU 6 mos.  Lynder Parents, MD, DFAPA   Please see After Visit Summary  for patient specific instructions.  No future appointments.   No orders of the defined types were placed in this encounter.     -------------------------------

## 2021-04-09 NOTE — Telephone Encounter (Signed)
Further questions received, completed and faxed to [employer]olai at [phone].

## 2021-04-11 NOTE — Telephone Encounter (Signed)
PA approved.   The request has been approved. The authorization is effective for a maximum of 3 fills from 04/11/2021 to 07/08/2021, as long as the member is enrolled in their current health plan. The request was approved as submitted. This request has been approved for a quantity of 63mL per 28 days. Additional authorizations have been entered for the following: Wegovy 0.5 mg/0.5 mL allowing 42mL per 28 days for 3 fills; please reference authorization [id number], Wegovy 1 mg/0.5 mL allowing 89mL per 28 days for 3 fills; please reference authorization [id number], Wegovy 1.7 mg/0.5 mL allowing 32mL per 28 days for 3 fills; please reference authorization [id number], Wegovy 2.4 mg/0.5 mL allowing 67mL per 28 days for 3 fills; please reference authorization [id number], effective 04/11/2021 through 07/08/2021. A written notification letter will follow with additional details

## 2021-04-16 ENCOUNTER — Other Ambulatory Visit: Payer: Self-pay | Admitting: Psychiatry

## 2021-04-16 ENCOUNTER — Other Ambulatory Visit (HOSPITAL_BASED_OUTPATIENT_CLINIC_OR_DEPARTMENT_OTHER): Payer: Self-pay

## 2021-04-16 ENCOUNTER — Other Ambulatory Visit (HOSPITAL_COMMUNITY): Payer: Self-pay

## 2021-04-16 DIAGNOSIS — F4001 Agoraphobia with panic disorder: Secondary | ICD-10-CM

## 2021-04-16 DIAGNOSIS — F411 Generalized anxiety disorder: Secondary | ICD-10-CM

## 2021-04-16 MED ORDER — ESCITALOPRAM OXALATE 10 MG PO TABS
10.0000 mg | ORAL_TABLET | Freq: Every day | ORAL | 1 refills | Status: DC
  Filled 2021-04-16: qty 90, 90d supply, fill #0
  Filled 2021-04-24 – 2021-06-30 (×2): qty 90, 90d supply, fill #1

## 2021-04-17 ENCOUNTER — Other Ambulatory Visit (HOSPITAL_COMMUNITY): Payer: Self-pay

## 2021-04-24 ENCOUNTER — Other Ambulatory Visit (HOSPITAL_COMMUNITY): Payer: Self-pay

## 2021-04-25 ENCOUNTER — Other Ambulatory Visit (HOSPITAL_COMMUNITY): Payer: Self-pay

## 2021-04-26 ENCOUNTER — Other Ambulatory Visit (HOSPITAL_COMMUNITY): Payer: Self-pay

## 2021-05-05 ENCOUNTER — Encounter: Payer: Self-pay | Admitting: Family Medicine

## 2021-05-06 ENCOUNTER — Other Ambulatory Visit: Payer: Self-pay | Admitting: Family Medicine

## 2021-05-06 DIAGNOSIS — Z7185 Encounter for immunization safety counseling: Secondary | ICD-10-CM

## 2021-05-27 ENCOUNTER — Other Ambulatory Visit (HOSPITAL_COMMUNITY): Payer: Self-pay

## 2021-05-27 ENCOUNTER — Other Ambulatory Visit: Payer: Self-pay | Admitting: Family Medicine

## 2021-05-28 ENCOUNTER — Other Ambulatory Visit (HOSPITAL_COMMUNITY): Payer: Self-pay

## 2021-05-28 MED ORDER — METFORMIN HCL ER 500 MG PO TB24
500.0000 mg | ORAL_TABLET | Freq: Every day | ORAL | 1 refills | Status: DC
  Filled 2021-05-28: qty 90, 90d supply, fill #0
  Filled 2021-08-15: qty 90, 90d supply, fill #1

## 2021-07-01 ENCOUNTER — Other Ambulatory Visit (HOSPITAL_COMMUNITY): Payer: Self-pay

## 2021-07-08 ENCOUNTER — Other Ambulatory Visit (HOSPITAL_COMMUNITY): Payer: Self-pay

## 2021-07-10 ENCOUNTER — Other Ambulatory Visit (HOSPITAL_COMMUNITY): Payer: Self-pay

## 2021-07-10 ENCOUNTER — Encounter: Payer: Self-pay | Admitting: Family Medicine

## 2021-07-10 MED ORDER — WEGOVY 0.5 MG/0.5ML ~~LOC~~ SOAJ
0.5000 mg | SUBCUTANEOUS | 0 refills | Status: DC
  Filled 2021-07-10 – 2021-07-11 (×2): qty 2, 28d supply, fill #0

## 2021-07-11 ENCOUNTER — Other Ambulatory Visit (HOSPITAL_COMMUNITY): Payer: Self-pay

## 2021-07-12 ENCOUNTER — Other Ambulatory Visit (HOSPITAL_COMMUNITY): Payer: Self-pay

## 2021-07-12 ENCOUNTER — Telehealth: Payer: Self-pay

## 2021-07-12 NOTE — Telephone Encounter (Signed)
PA initiated via Covermymeds; KEY: [id number]. Awaiting determination.  ?

## 2021-07-13 ENCOUNTER — Other Ambulatory Visit (HOSPITAL_COMMUNITY): Payer: Self-pay

## 2021-07-18 ENCOUNTER — Other Ambulatory Visit (HOSPITAL_COMMUNITY): Payer: Self-pay

## 2021-07-20 ENCOUNTER — Other Ambulatory Visit (HOSPITAL_COMMUNITY): Payer: Self-pay

## 2021-07-24 ENCOUNTER — Other Ambulatory Visit (HOSPITAL_COMMUNITY): Payer: Self-pay

## 2021-07-26 ENCOUNTER — Other Ambulatory Visit (HOSPITAL_COMMUNITY): Payer: Self-pay

## 2021-07-29 ENCOUNTER — Other Ambulatory Visit (HOSPITAL_COMMUNITY): Payer: Self-pay

## 2021-07-29 NOTE — Telephone Encounter (Signed)
Have not received response from PA. PA closed.

## 2021-08-09 ENCOUNTER — Other Ambulatory Visit (HOSPITAL_COMMUNITY): Payer: Self-pay

## 2021-08-14 ENCOUNTER — Other Ambulatory Visit (HOSPITAL_COMMUNITY): Payer: Self-pay

## 2021-08-14 NOTE — Telephone Encounter (Signed)
Mancel Parsons has been approved from 08/14/21-08/14/22.

## 2021-08-15 ENCOUNTER — Other Ambulatory Visit (HOSPITAL_COMMUNITY): Payer: Self-pay

## 2021-08-15 ENCOUNTER — Other Ambulatory Visit: Payer: Self-pay | Admitting: Psychiatry

## 2021-08-15 DIAGNOSIS — F4001 Agoraphobia with panic disorder: Secondary | ICD-10-CM

## 2021-08-15 DIAGNOSIS — F411 Generalized anxiety disorder: Secondary | ICD-10-CM

## 2021-08-15 MED ORDER — ESCITALOPRAM OXALATE 10 MG PO TABS
10.0000 mg | ORAL_TABLET | Freq: Every day | ORAL | 0 refills | Status: DC
  Filled 2021-08-15 – 2021-10-14 (×2): qty 90, 90d supply, fill #0

## 2021-08-16 ENCOUNTER — Other Ambulatory Visit (HOSPITAL_COMMUNITY): Payer: Self-pay

## 2021-08-26 ENCOUNTER — Other Ambulatory Visit (HOSPITAL_COMMUNITY): Payer: Self-pay

## 2021-08-27 ENCOUNTER — Telehealth: Payer: No Typology Code available for payment source | Admitting: Physician Assistant

## 2021-08-27 DIAGNOSIS — M26609 Unspecified temporomandibular joint disorder, unspecified side: Secondary | ICD-10-CM

## 2021-08-28 ENCOUNTER — Other Ambulatory Visit (HOSPITAL_COMMUNITY): Payer: Self-pay

## 2021-08-28 MED ORDER — MELOXICAM 7.5 MG PO TABS
7.5000 mg | ORAL_TABLET | Freq: Every day | ORAL | 0 refills | Status: DC
Start: 2021-08-28 — End: 2022-01-02
  Filled 2021-08-28: qty 30, 30d supply, fill #0

## 2021-08-28 NOTE — Progress Notes (Signed)
I have spent 5 minutes in review of e-visit questionnaire, review and updating patient chart, medical decision making and response to patient.   Yobani Schertzer Cody Saranda Legrande, PA-C    

## 2021-08-28 NOTE — Progress Notes (Signed)
E-Visit for Dental Pain  We are sorry that you are not feeling well.  Here is how we plan to help!  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 Try to avoid chewing on the affected side when possible. I am sending in a pain reliever/antiinflammatory, Mobic, to take once daily to help. You can check with your dentist to see if they treat TMJ as some do. Otherwise you will want to follow-up with your PCP for consideration of an ENT referral giving the likely chronicity of this issue.   For jaw pain:  Aspirin may be helpful for problems in the joint of the jaw in adults. If pain happens every time you open your mouth widely, the temporomandibular joint (TMJ) may be the source of the pain. Yawning or taking a large bite of food may worsen the pain. An appointment with your doctor or dentist will help you find the cause.    GET HELP RIGHT AWAY IF:  You have a high fever or chills If you have had a recent head or face injury and develop headache, light headedness, nausea, vomiting, or other symptoms that concern you after an injury to your face or mouth, you could have a more serious injury in addition to your dental injury. A facial rash associated with a toothache: This condition may improve with medication. Contact your doctor for them to decide what is appropriate. Any jaw pain occurring with chest pain: Although jaw pain is most commonly caused by dental disease, it is sometimes referred pain from other areas. People with heart disease, especially people who have had stents placed, people with diabetes, or those who have had heart surgery may have jaw pain as a symptom of heart attack or angina. If your jaw or tooth pain is associated with lightheadedness, sweating, or shortness of breath, you should see a doctor as soon as possible. Trouble swallowing or  excessive pain or bleeding from gums: If you have a history of a weakened immune system, diabetes, or steroid use, you may be more susceptible to infections. Infections can often be more severe and extensive or caused by unusual organisms. Dental and gum infections in people with these conditions may require more aggressive treatment. An abscess may need draining or IV antibiotics, for example.  MAKE SURE YOU   Understand these instructions. Will watch your condition. Will get help right away if you are not doing well or get worse.  Thank you for choosing an e-visit.  Your e-visit answers were reviewed by a board certified advanced clinical practitioner to complete your personal care plan. Depending upon the condition, your plan could have included both over the counter or prescription medications.  Please review your pharmacy choice. Make sure the pharmacy is open so you can pick up prescription now. If there is a problem, you may contact your provider through [employer] and have the prescription routed to another pharmacy.  Your safety is important to [place]. If you have drug allergies check your prescription carefully.   For the next 24 hours you can use MyChart to ask questions about today's visit, request a non-urgent call back, or ask for a work or school excuse. You will get an email in the next two days asking about your experience. I hope that your e-visit has been valuable and will speed your recovery.

## 2021-08-31 ENCOUNTER — Other Ambulatory Visit (HOSPITAL_COMMUNITY): Payer: Self-pay

## 2021-09-11 ENCOUNTER — Other Ambulatory Visit (HOSPITAL_COMMUNITY): Payer: Self-pay

## 2021-09-11 ENCOUNTER — Ambulatory Visit (INDEPENDENT_AMBULATORY_CARE_PROVIDER_SITE_OTHER): Payer: No Typology Code available for payment source | Admitting: Family

## 2021-09-11 VITALS — BP 132/64 | HR 71 | Temp 98.0°F | Resp 16 | Wt 253.0 lb

## 2021-09-11 DIAGNOSIS — H6981 Other specified disorders of Eustachian tube, right ear: Secondary | ICD-10-CM

## 2021-09-11 DIAGNOSIS — M26609 Unspecified temporomandibular joint disorder, unspecified side: Secondary | ICD-10-CM | POA: Diagnosis not present

## 2021-09-11 MED ORDER — CYCLOBENZAPRINE HCL 5 MG PO TABS
5.0000 mg | ORAL_TABLET | Freq: Every evening | ORAL | 0 refills | Status: DC | PRN
  Filled 2021-09-11: qty 30, 30d supply, fill #0

## 2021-09-11 NOTE — Assessment & Plan Note (Signed)
Recommended trial of flonase 2 sprays each nostril once daily.

## 2021-09-11 NOTE — Progress Notes (Signed)
Subjective:   By signing my name below, I, Cheryl Owens, attest that this documentation has been prepared under the direction and in the presence of Cheryl Alar, NP 09/11/2021    Patient ID: Cheryl Owens, female    DOB: Jun 15, 1965, 56 y.o.   MRN: [id number]  Chief Complaint  Patient presents with   Jaw Pain    Was diagnosed with TMJ at dentist. "Jaw pain not better, now pain in right ear"    HPI Patient is in today for a office visit.  Jaw pain- Patient is complaining about right jaw pain for the past 3 weeks. She has a history of clicking in her jaw but had no pain piror. Yawning and eating worsens her pain.  Ear pain- She also complains of having ear pain for several days and has sought out physical therapy. She was prescribed meloxicam '15mg'$  from the ED to manage her pain. She has recently been to the dentist and was recommended to massage her jaw with a cold press. She has been trying to reduce opening her mouth.  Meloxicam-She says that the meloxicam does not help with her jaw or ear pain. She has been takin 2 15 mg tablets of meloxican PO.  Cheryl Owens has been taking wegovy after taking a brief pause from it.    Health Maintenance Due  Topic Date Due   COVID-19 Vaccine (5 - Pfizer series) 09/28/2020   PAP SMEAR-Modifier  08/19/2021    Past Medical History:  Diagnosis Date   Anxiety    Cellulitis    diagnosed Sept.7 2021, left upper  posterior arm,  right upper posterior arm   Depression    Dry mouth    Fibroids    Gallbladder disease    High cholesterol    HTN (hypertension) 2007   seen in ER  with headaches   Hypertension    Hypokalemia 2007   due to HCTZ   Nervousness    Obesity    Pre-diabetes    Stress    Swelling of lower extremity    Trouble in sleeping     Past Surgical History:  Procedure Laterality Date   arm surgery     post trauma @ age 47   BREAST SURGERY Right 2016   BREAST BX    CHOLECYSTECTOMY     gall stones   GANGLION CYST  EXCISION N/A    wrist   ROBOTIC ASSISTED TOTAL HYSTERECTOMY WITH BILATERAL SALPINGO OOPHERECTOMY Bilateral 11/28/2019   Procedure: XI ROBOTIC ASSISTED TOTAL LAPAROSCOPIC HYSTERECTOMY WITH BILATERAL SALPINGO OOPHORECTOMY;  Surgeon: Princess Bruins, MD;  Location: [facility];  Service: Gynecology;  Laterality: Bilateral;  request 2 1/2 hours OR time request 8:30am OR start in [place] [facility]   WISDOM TOOTH EXTRACTION      Family History  Problem Relation Age of Onset   Cancer Maternal Grandmother        thyroid   Hypertension Maternal Grandmother    Hyperlipidemia Mother    Hypertension Mother    Depression Mother    Anxiety disorder Mother    Obesity Mother    Breast cancer Maternal Aunt 39   Kidney disease Maternal Aunt         X 2; 1 on Dialysis   Cancer Maternal Aunt        ? primary; also had HTN   Prostate cancer Paternal Uncle          3 uncles   Prostate cancer Father  Cancer Father    Hypertension Paternal Grandmother    Hypertension Maternal Aunt        X 2   Bipolar disorder Paternal Aunt    Stroke Neg Hx    Diabetes Neg Hx    Heart disease Neg Hx    Colon cancer Neg Hx     Social History   Socioeconomic History   Marital status: Single    Spouse name: Not on file   Number of children: 1   Years of education: Not on file   Highest education level: Not on file  Occupational History   Occupation: Cabin crew  Tobacco Use   Smoking status: Never   Smokeless tobacco: Never  Vaping Use   Vaping Use: Never used  Substance and Sexual Activity   Alcohol use: No   Drug use: No   Sexual activity: Not Currently    Birth control/protection: Surgical    Comment: 1st intercourse- 89, partners- 17, hysterectomy  Other Topics Concern   Not on file  Social History Narrative   Not on file   Social Determinants of Health   Financial Resource Strain: Not on file  Food Insecurity: Not on file  Transportation Needs: Not on file   Physical Activity: Not on file  Stress: Not on file  Social Connections: Not on file  Intimate Partner Violence: Not on file    Outpatient Medications Prior to Visit  Medication Sig Dispense Refill   atorvastatin (LIPITOR) 20 MG tablet Take 1 tablet (20 mg total) by mouth daily. 90 tablet 3   clonazePAM (KLONOPIN) 0.5 MG tablet TAKE ONE-HALF (1/2) TABLET BY MOUTH IN THE MORNING AND 2 TABLETS AT BEDTIME (Patient taking differently: TAKE ONE-HALF (1/2) TABLET BY MOUTH IN THE MORNING AND 1 TABLETS AT BEDTIME) 225 tablet 1   escitalopram (LEXAPRO) 10 MG tablet Take 1 tablet by mouth daily. 90 tablet 0   labetalol (NORMODYNE) 100 MG tablet Take 1 tablet (100 mg total) by mouth 2 (two) times daily. 180 tablet 3   meloxicam (MOBIC) 7.5 MG tablet Take 1 tablet (7.5 mg total) by mouth daily. 30 tablet 0   metFORMIN (GLUCOPHAGE-XR) 500 MG 24 hr tablet Take 1 tablet (500 mg total) by mouth daily with breakfast. 90 tablet 1   omeprazole (PRILOSEC) 20 MG capsule Take 1 capsule (20 mg total) by mouth daily. 30 capsule 3   Semaglutide-Weight Management (WEGOVY) 0.5 MG/0.5ML SOAJ Inject 0.5 mg into the skin once a week. 2 mL 0   spironolactone (ALDACTONE) 25 MG tablet Take 1 tablet (25 mg total) by mouth 2 (two) times daily. 180 tablet 3   Vitamin D, Ergocalciferol, (DRISDOL) 1.25 MG (50000 UNIT) CAPS capsule Take 1 capsule (50,000 Units total) by mouth every 7 (seven) days. 12 capsule 2   ALPRAZolam (XANAX) 0.5 MG tablet Take 1 tablet (0.5 mg total) by mouth every 6 (six) hours as needed for anxiety (flyihg phobia). 12 tablet 0   buPROPion (WELLBUTRIN XL) 150 MG 24 hr tablet Take 1 tablet my mouth daily for 1 month then discontinue. 30 tablet 0   PREBIOTIC PRODUCT PO Take by mouth.     No facility-administered medications prior to visit.    Allergies  Allergen Reactions   Hydrochlorothiazide     REACTION: hypokalemia   Benazepril Nausea And Vomiting    abd cramps    ROS     Objective:     Physical Exam Constitutional:      Appearance: Normal appearance. She is  not ill-appearing.  HENT:     Head: Normocephalic and atraumatic.     Right Ear: External ear normal. Tympanic membrane is retracted.     Left Ear: External ear normal.  Eyes:     Extraocular Movements: Extraocular movements intact.     Pupils: Pupils are equal, round, and reactive to light.  Cardiovascular:     Rate and Rhythm: Normal rate and regular rhythm.     Pulses: Normal pulses.     Heart sounds: Normal heart sounds. No murmur heard.    No gallop.  Pulmonary:     Effort: Pulmonary effort is normal.     Breath sounds: Normal breath sounds.  Skin:    General: Skin is warm and dry.  Neurological:     Mental Status: She is alert and oriented to person, place, and time.  Psychiatric:        Judgment: Judgment normal.     BP 132/64 (BP Location: Left Arm, Patient Position: Sitting, Cuff Size: Large)   Pulse 71   Temp 98 F (36.7 C) (Oral)   Resp 16   Wt 253 lb (114.8 kg)   LMP 12/01/2018 (Approximate)   SpO2 100%   BMI 39.63 kg/m  Wt Readings from Last 3 Encounters:  09/11/21 253 lb (114.8 kg)  12/25/20 248 lb 9.6 oz (112.8 kg)  11/15/20 246 lb 12.8 oz (111.9 kg)       Assessment & Plan:   Problem List Items Addressed This Visit       Unprioritized   TMJ dysfunction - Primary    Uncontrolled. Recommended that she continue meloxicam 7.'5mg'$  once daily for 1 more week. Can ice jaw twice daily. Refer to PT. Rx provided for HS flexeril prn.        Relevant Medications   cyclobenzaprine (FLEXERIL) 5 MG tablet   Other Relevant Orders   Ambulatory referral to Physical Therapy   Eustachian tube dysfunction, right    Recommended trial of flonase 2 sprays each nostril once daily.           Meds ordered this encounter  Medications   cyclobenzaprine (FLEXERIL) 5 MG tablet    Sig: Take 1 tablet (5 mg total) by mouth at bedtime as needed for muscle spasms.    Dispense:  30 tablet     Refill:  0    Order Specific Question:   Supervising Provider    Answer:   Penni Homans A [[id number]]    I, Nance Pear, NP, personally preformed the services described in this documentation.  All medical record entries made by the scribe were at my direction and in my presence.  I have reviewed the chart and discharge instructions (if applicable) and agree that the record reflects my personal performance and is accurate and complete. 09/11/2021  I,Cheryl Owens,acting as a scribe for Nance Pear, NP.,have documented all relevant documentation on the behalf of Nance Pear, NP,as directed by  Nance Pear, NP while in the presence of Nance Pear, NP.    Nance Pear, NP

## 2021-09-11 NOTE — Assessment & Plan Note (Signed)
Uncontrolled. Recommended that she continue meloxicam 7.'5mg'$  once daily for 1 more week. Can ice jaw twice daily. Refer to PT. Rx provided for HS flexeril prn.

## 2021-09-12 ENCOUNTER — Ambulatory Visit: Payer: No Typology Code available for payment source | Admitting: Family Medicine

## 2021-09-19 ENCOUNTER — Ambulatory Visit: Payer: No Typology Code available for payment source | Admitting: Psychiatry

## 2021-09-20 ENCOUNTER — Other Ambulatory Visit: Payer: Self-pay

## 2021-09-20 ENCOUNTER — Encounter: Payer: Self-pay | Admitting: Rehabilitative and Restorative Service Providers"

## 2021-09-20 ENCOUNTER — Ambulatory Visit
Payer: No Typology Code available for payment source | Attending: Family | Admitting: Rehabilitative and Restorative Service Providers"

## 2021-09-20 DIAGNOSIS — M26609 Unspecified temporomandibular joint disorder, unspecified side: Secondary | ICD-10-CM | POA: Insufficient documentation

## 2021-09-20 DIAGNOSIS — R29898 Other symptoms and signs involving the musculoskeletal system: Secondary | ICD-10-CM | POA: Diagnosis not present

## 2021-09-20 DIAGNOSIS — R29810 Facial weakness: Secondary | ICD-10-CM | POA: Insufficient documentation

## 2021-09-20 DIAGNOSIS — R293 Abnormal posture: Secondary | ICD-10-CM | POA: Insufficient documentation

## 2021-09-20 NOTE — Therapy (Signed)
[facility] [place], [place], [address] Phone: [phone]   Fax:  [phone]  Physical Therapy Evaluation  Patient Details  Name: Cheryl Owens MRN: [id number] Date of Birth: 05/02/1965 Referring Provider (PT): [name], NP   Encounter Date: 09/20/2021   PT End of Session - 09/20/21 1201     Visit Number 1    Number of Visits 8    Date for PT Re-Evaluation 11/19/21    PT Start Time 8502    PT Stop Time 7741    PT Time Calculation (min) 48 min             Past Medical History:  Diagnosis Date   Anxiety    Cellulitis    diagnosed Sept.7 2021, left upper  posterior arm,  right upper posterior arm   Depression    Dry mouth    Fibroids    Gallbladder disease    High cholesterol    HTN (hypertension) 2007   seen in ER  with headaches   Hypertension    Hypokalemia 2007   due to HCTZ   Nervousness    Obesity    Pre-diabetes    Stress    Swelling of lower extremity    Trouble in sleeping     Past Surgical History:  Procedure Laterality Date   arm surgery     post trauma @ age 41   BREAST SURGERY Right 2016   BREAST BX    CHOLECYSTECTOMY     gall stones   GANGLION CYST EXCISION N/A    wrist   ROBOTIC ASSISTED TOTAL HYSTERECTOMY WITH BILATERAL SALPINGO OOPHERECTOMY Bilateral 11/28/2019   Procedure: XI ROBOTIC ASSISTED TOTAL LAPAROSCOPIC HYSTERECTOMY WITH BILATERAL SALPINGO OOPHORECTOMY;  Surgeon: Princess Bruins, MD;  Location: [facility];  Service: Gynecology;  Laterality: Bilateral;  request 2 1/2 hours OR time request 8:30am OR start in [place] [facility]   WISDOM TOOTH EXTRACTION      There were no vitals filed for this visit.    Subjective Assessment - 09/20/21 1018     Subjective The patient reports h/o clicking and popping in her TMJ x years.  About 6 weeks ago, she developed pain after traveling and eating suckers + chewing gum.  She initially  thought it was tooth related, however, dentist cleared teeth and thought it may be TMJ.  She inquires about DN for this condition.  "In the last couple of days, pain has been so bad that she won't eat anything that requires her to open her mouth wide."  She has been doing you tube exercises. She inquires about posture as a possible contributing factor.    Pertinent History HTN, taking metformin    Patient Stated Goals Pain free, be able to return to eating regular diet    Currently in Pain? Yes    Pain Score 0-No pain   increases to 2/10 with opening *that is with modifications   Pain Location Jaw    Pain Orientation Right    Pain Descriptors / Indicators Sore    Pain Type Acute pain    Pain Radiating Towards some soreness under the jaw    Pain Onset More than a month ago    Pain Frequency Intermittent    Aggravating Factors  eating food that requires large jaw ROM    Pain Relieving Factors rest, keep small ROM, cold did not help  [facility] PT Assessment - 09/20/21 1023       Assessment   Medical Diagnosis TMJ dysfunction    Referring Provider (PT) [name], NP    Onset Date/Surgical Date 09/11/21    Hand Dominance Right    Prior Therapy none      Precautions   Precautions None      Balance Screen   Has the patient fallen in the past 6 months No    Has the patient had a decrease in activity level because of a fear of falling?  No    Is the patient reluctant to leave their home because of a fear of falling?  No      Home Ecologist residence    Living Arrangements Alone      Prior Function   Level of Independence Independent    Vocation Full time employment    Vocation Requirements receptionist    Leisure sporting events, leisure, wants to start walking with friend      Observation/Other Assessments   Focus on Therapeutic Outcomes (FOTO)  n/a due to body region      Posture/Postural Control   Posture/Postural  Control Postural limitations    Posture Comments leans to the left side in sitting, has forward posture with rounded shoulders      ROM / Strength   AROM / PROM / Strength AROM;Strength      AROM   Overall AROM  Deficits    AROM Assessment Site Jaw;Cervical    Cervical Flexion WNLs    Cervical Extension WNLs    Cervical - Right Side Bend WNLs    Cervical - Left Side Bend WNLs    Cervical - Right Rotation WNLs    Cervical - Left Rotation WNLs    Jaw-Incisal Opening  4 cm with 4/10 pain    Jaw-Left Lateral Excurison Significant pain with decreased ROM    Jaw-Right Lateral Excursion Significant pain with R deviation (worse than to the left)      Strength   Overall Strength Deficits    Overall Strength Comments dec'd strength per observation -- limited by pain      Palpation   Palpation comment tenderness to palpation with masseter right, suprahyoid muscles, some tightness bilateral upper traps                        Objective measurements completed on examination: See above findings.       [facility] PT Treatment/Exercise - 09/20/21 1023       Self-Care   Self-Care Other Self-Care Comments    Other Self-Care Comments  ice 2x/day x 20 minutes to reduce inflammation      Exercises   Exercises Other Exercises;Shoulder    Other Exercises  Seated open/close with tongue on roof of mouth for TMJ, isometric jaw opening.  Unable to tolerate opening> closing with jaw forward.      Shoulder Exercises: Seated   Other Seated Exercises seated W for scap retraction    Other Seated Exercises seated chin tucks for posterior musculature engagement with cues on technique      Shoulder Exercises: Standing   External Rotation Strengthening;Both;10 reps      Shoulder Exercises: Stretch   Corner Stretch Limitations door frame stretch W and V      Manual Therapy   Manual Therapy Soft tissue mobilization    Manual therapy comments cervical and jaw    Soft tissue  mobilization bilateral suboccipital release, PROM neck, sidebending, and STM for masseter and suprahyoid musculature                     PT Education - 09/20/21 1104     Education Details HEP, self massage    Person(s) Educated Patient    Methods Explanation;Demonstration;Handout    Comprehension Verbalized understanding;Returned demonstration              PT Short Term Goals - 09/20/21 1203       PT SHORT TERM GOAL #1   Title The patient will be indep with initial HEP.    Time 4    Period Weeks    Target Date 10/20/21               PT Long Term Goals - 09/20/21 1204       PT LONG TERM GOAL #1   Title The patient will be indep with progression of HEP.    Time 8    Period Weeks    Target Date 11/19/21      PT LONG TERM GOAL #2   Title The patient will report return to eating a sandwich without jaw pain.    Time 6    Period Weeks    Target Date 11/19/21      PT LONG TERM GOAL #3   Title The patient will improve jaw opening from 4cm to 6cm without pain.    Time 8    Period Weeks    Target Date 11/19/21      PT LONG TERM GOAL #4   Title The patient will demonstrate improved upright posture for awareness during work and daily activities.    Time 8    Period Weeks    Target Date 11/19/21                    Plan - 09/20/21 1305     Clinical Impression Statement The patient is a 56 year old female presenting to OP rehab with 6+ week h/o TMJ pain and jaw pain.  She presents with impairments today of dec'd ROM for jaw opening, dec'd ROM for lateral deviation, postural weakness, postural abnormalities, muscular tightness, and pain.  PT to address deficits to promote improved ROM, strength and ability to return to eating without jaw pain.    Personal Factors and Comorbidities Comorbidity 2    Comorbidities HTN, on metformin (diabetes)    Examination-Participation Restrictions Other    Stability/Clinical Decision Making Stable/Uncomplicated     Clinical Decision Making Low    Rehab Potential Good    PT Frequency 1x / week    PT Duration 8 weeks    PT Treatment/Interventions ADLs/Self Care Home Management;Therapeutic activities;Therapeutic exercise;Patient/family education;Electrical Stimulation;Cryotherapy;Moist Heat;Dry needling;Manual techniques;Neuromuscular re-education    PT Next Visit Plan check HEP, DN for upper traps, masseter, STM for suprahyoid musculature, postural strengthening posterior chain,    PT Home Exercise Plan [email or web address]    Consulted and Agree with Plan of Care Patient             Patient will benefit from skilled therapeutic intervention in order to improve the following deficits and impairments:  Increased fascial restricitons, Decreased range of motion, Decreased strength, Impaired flexibility, Postural dysfunction, Decreased activity tolerance  Visit Diagnosis: Abnormal posture  Facial weakness  Other symptoms and signs involving the musculoskeletal system     Problem List Patient Active Problem List   Diagnosis Date Noted  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   Eustachian tube dysfunction, right 09/11/2021   Dysphagia 12/26/2020   Postoperative state 11/28/2019   Cellulitis of right upper extremity 11/17/2019   Impacted cerumen of left ear 03/29/2019   Otitis externa 03/29/2019   Prediabetes 03/06/2019   Class 2 severe obesity with serious comorbidity and body mass index (BMI) of 36.0 to 36.9 in adult [facility]) 01/17/2019   Stage 3a chronic kidney disease ([place]) 12/23/2018   Vitamin D deficiency 12/23/2018   Severe major depression without psychotic features ([place]) 11/10/2017   Preventative health care 06/08/2017   Elevated serum creatinine 03/13/2017   Depression 04/19/2015   Uterine fibroid 04/13/2014   Plantar fasciitis 04/13/2014   Positive urine drug screen 09/29/2013   Nausea alone 09/13/2013   Hypotension, unspecified 09/13/2013   Anxiety state 09/13/2013   Diarrhea 09/13/2013   Obesity  (BMI 30-39.9) 02/15/2013   FASTING HYPERGLYCEMIA 09/28/2008   SNORING, HX OF 10/04/2007   Hyperlipidemia LDL goal <100 05/05/2007   Essential hypertension 60/15/6153   METABOLIC SYNDROME X 79/43/2761    Enzio Buchler, PT 09/20/2021, 1:15 PM  [facility] [place], [place], [address] Phone: [phone]   Fax:  [phone]  Name: Cheryl Owens MRN: [id number] Date of Birth: 23-Jul-1965

## 2021-09-20 NOTE — Patient Instructions (Signed)
Access Code: [email or web address] URL: [email or web address] Date: 09/20/2021 Prepared by: Rudell Cobb  Exercises - Controlled Jaw Opening with Tongue on Roof of Mouth  - 2 x daily - 7 x weekly - 2 sets - 10 reps - Doorway Pec Stretch at 60 Elevation  - 2 x daily - 7 x weekly - 1 sets - 3 reps - 30 seconds hold - Doorway Pec Stretch at 120 Degrees Abduction  - 2 x daily - 7 x weekly - 1 sets - 3 reps - 30 seconds hold - Isometric Jaw Abduction  - 2 x daily - 7 x weekly - 2 sets - 5 reps - 3-5 seconds hold - Seated Cervical Retraction  - 2 x daily - 7 x weekly - 1 sets - 10 reps - Seated Scapular Retraction with External Rotation  - 2 x daily - 7 x weekly - 1 sets - 10 reps  Patient Education - Self-Massage Techniques

## 2021-09-21 ENCOUNTER — Other Ambulatory Visit: Payer: Self-pay | Admitting: Family Medicine

## 2021-09-23 ENCOUNTER — Other Ambulatory Visit (HOSPITAL_COMMUNITY): Payer: Self-pay

## 2021-09-23 ENCOUNTER — Encounter: Payer: Self-pay | Admitting: Family Medicine

## 2021-09-23 MED ORDER — WEGOVY 1 MG/0.5ML ~~LOC~~ SOAJ
1.0000 mg | SUBCUTANEOUS | 0 refills | Status: DC
  Filled 2021-09-23: qty 2, 28d supply, fill #0

## 2021-09-24 ENCOUNTER — Telehealth: Payer: Self-pay | Admitting: Rehabilitative and Restorative Service Providers"

## 2021-09-24 ENCOUNTER — Other Ambulatory Visit (HOSPITAL_COMMUNITY): Payer: Self-pay

## 2021-09-24 NOTE — Telephone Encounter (Signed)
The patient called today to clinic b/c she feels worse.  She has been doing exercises -- she had not noticed much improvement, but also no worse.  She went to lunch and tried to eat a sandwich.  This flared her TMJ issues today.  She went home and took flexeril and went to bed (had to call out from work).  Moved appt from Friday to tomorrow.  Rudell Cobb, MPT

## 2021-09-25 ENCOUNTER — Ambulatory Visit: Payer: No Typology Code available for payment source | Admitting: Rehabilitative and Restorative Service Providers"

## 2021-09-25 ENCOUNTER — Encounter: Payer: Self-pay | Admitting: Rehabilitative and Restorative Service Providers"

## 2021-09-25 DIAGNOSIS — M26609 Unspecified temporomandibular joint disorder, unspecified side: Secondary | ICD-10-CM | POA: Diagnosis not present

## 2021-09-25 DIAGNOSIS — R29898 Other symptoms and signs involving the musculoskeletal system: Secondary | ICD-10-CM

## 2021-09-25 DIAGNOSIS — R29810 Facial weakness: Secondary | ICD-10-CM

## 2021-09-25 DIAGNOSIS — R293 Abnormal posture: Secondary | ICD-10-CM

## 2021-09-25 NOTE — Therapy (Signed)
[facility] [place], [place], [address] Phone: [phone]   Fax:  [phone]  Physical Therapy Treatment  Patient Details  Name: Cheryl Owens MRN: [id number] Date of Birth: Jan 14, 1966 Referring Provider (PT): [name], NP   Encounter Date: 09/25/2021   PT End of Session - 09/25/21 0758     Visit Number 2    Number of Visits 8    Date for PT Re-Evaluation 11/19/21    PT Start Time 0800    PT Stop Time 0867    PT Time Calculation (min) 55 min             Past Medical History:  Diagnosis Date   Anxiety    Cellulitis    diagnosed Sept.7 2021, left upper  posterior arm,  right upper posterior arm   Depression    Dry mouth    Fibroids    Gallbladder disease    High cholesterol    HTN (hypertension) 2007   seen in ER  with headaches   Hypertension    Hypokalemia 2007   due to HCTZ   Nervousness    Obesity    Pre-diabetes    Stress    Swelling of lower extremity    Trouble in sleeping     Past Surgical History:  Procedure Laterality Date   arm surgery     post trauma @ age 58   BREAST SURGERY Right 2016   BREAST BX    CHOLECYSTECTOMY     gall stones   GANGLION CYST EXCISION N/A    wrist   ROBOTIC ASSISTED TOTAL HYSTERECTOMY WITH BILATERAL SALPINGO OOPHERECTOMY Bilateral 11/28/2019   Procedure: XI ROBOTIC ASSISTED TOTAL LAPAROSCOPIC HYSTERECTOMY WITH BILATERAL SALPINGO OOPHORECTOMY;  Surgeon: Princess Bruins, MD;  Location: [place];  Service: Gynecology;  Laterality: Bilateral;  request 2 1/2 hours OR time request 8:30am OR start in [place] [facility]   WISDOM TOOTH EXTRACTION      There were no vitals filed for this visit.   Subjective Assessment - 09/25/21 0759     Subjective The patient reports 2/10 right now, but she cannot eat.  She called yesterday due to increased popping/clicking in her TMJ and incresed pain after eating croissant at lunch  yesterday.  She felt that exercises were not increasing pain, but was not noticing a significant reduction in pain.    Pertinent History HTN, taking metformin    Patient Stated Goals Pain free, be able to return to eating regular diet    Currently in Pain? Yes    Pain Score 2     Pain Location Jaw    Pain Orientation Right    Pain Descriptors / Indicators Sore    Pain Type Acute pain    Pain Onset More than a month ago    Pain Frequency Intermittent    Aggravating Factors  sandwiches/ food options    Pain Relieving Factors rest, small ROM                [facility] PT Assessment - 09/25/21 0805       Assessment   Medical Diagnosis TMJ dysfunction    Referring Provider (PT) [name], NP    Onset Date/Surgical Date 09/11/21    Hand Dominance Right                           [facility] Adult PT Treatment/Exercise - 09/25/21 6195  Exercises   Exercises Other Exercises;Neck      Neck Exercises: Stretches   Upper Trapezius Stretch Right;Left;2 reps;30 seconds    Neck Stretch 2 reps;30 seconds    Neck Stretch Limitations clavicle/first rib depression + lateral tilt + rotation for anterior neck stretching    Other Neck Stretches supine passive neck ROM with overpressure into stretching all planes      Modalities   Modalities Electrical Stimulation;Moist Heat      Moist Heat Therapy   Number Minutes Moist Heat 10 Minutes    Moist Heat Location Cervical   jaw     Electrical Stimulation   Electrical Stimulation Location neck    Electrical Stimulation Action interferential    Electrical Stimulation Parameters to tolerance    Electrical Stimulation Goals Tone;Pain      Manual Therapy   Manual Therapy Soft tissue mobilization    Manual therapy comments cervical and jaw; palpation to assess response to STM and DN    Soft tissue mobilization bilateral suboccipital release, PROM neck, sidebending, and STM for masseter and suprahyoid musculature               Trigger Point Dry Needling - 09/25/21 0904     Consent Given? Yes    Education Handout Provided Previously provided    Muscles Treated Head and Neck Upper trapezius;Levator scapulae;Masseter    Upper Trapezius Response Twitch reponse elicited;Palpable increased muscle length   bilaterally   Levator Scapulae Response Twitch response elicited;Palpable increased muscle length   bilaterally   Masseter Response Palpable increased muscle length                     PT Short Term Goals - 09/20/21 1203       PT SHORT TERM GOAL #1   Title The patient will be indep with initial HEP.    Time 4    Period Weeks    Target Date 10/20/21               PT Long Term Goals - 09/20/21 1204       PT LONG TERM GOAL #1   Title The patient will be indep with progression of HEP.    Time 8    Period Weeks    Target Date 11/19/21      PT LONG TERM GOAL #2   Title The patient will report return to eating a sandwich without jaw pain.    Time 6    Period Weeks    Target Date 11/19/21      PT LONG TERM GOAL #3   Title The patient will improve jaw opening from 4cm to 6cm without pain.    Time 8    Period Weeks    Target Date 11/19/21      PT LONG TERM GOAL #4   Title The patient will demonstrate improved upright posture for awareness during work and daily activities.    Time 8    Period Weeks    Target Date 11/19/21                   Plan - 09/25/21 0851     Clinical Impression Statement The patient was performing HEP regularly since last visit (jaw ones, had not had time for postural exercises).  She had a worsening of symptoms yesterday associated with a sandwich she attempted to eat.  PT worked her in today and introduced more STM and dry needling to address masseter pain/tightness,  suprahyoid tightness, and SCM/trap/scalene tightness.  PT to continue to progress to patient tolerance.    PT Frequency 1x / week    PT Duration 8 weeks    PT  Treatment/Interventions ADLs/Self Care Home Management;Therapeutic activities;Therapeutic exercise;Patient/family education;Electrical Stimulation;Cryotherapy;Moist Heat;Dry needling;Manual techniques;Neuromuscular re-education    PT Next Visit Plan check HEP, DN for upper traps, masseter, STM for suprahyoid musculature, postural strengthening posterior chain,    PT Home Exercise Plan [id number]    Consulted and Agree with Plan of Care Patient             Patient will benefit from skilled therapeutic intervention in order to improve the following deficits and impairments:  Increased fascial restricitons, Decreased range of motion, Decreased strength, Impaired flexibility, Postural dysfunction, Decreased activity tolerance  Visit Diagnosis: Abnormal posture  Facial weakness  Other symptoms and signs involving the musculoskeletal system     Problem List Patient Active Problem List   Diagnosis Date Noted   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   Eustachian tube dysfunction, right 09/11/2021   Dysphagia 12/26/2020   Postoperative state 11/28/2019   Cellulitis of right upper extremity 11/17/2019   Impacted cerumen of left ear 03/29/2019   Otitis externa 03/29/2019   Prediabetes 03/06/2019   Class 2 severe obesity with serious comorbidity and body mass index (BMI) of 36.0 to 36.9 in adult ([place]) 01/17/2019   Stage 3a chronic kidney disease ([place]) 12/23/2018   Vitamin D deficiency 12/23/2018   Severe major depression without psychotic features ([place]) 11/10/2017   Preventative health care 06/08/2017   Elevated serum creatinine 03/13/2017   Depression 04/19/2015   Uterine fibroid 04/13/2014   Plantar fasciitis 04/13/2014   Positive urine drug screen 09/29/2013   Nausea alone 09/13/2013   Hypotension, unspecified 09/13/2013   Anxiety state 09/13/2013   Diarrhea 09/13/2013   Obesity (BMI 30-39.9) 02/15/2013   FASTING HYPERGLYCEMIA 09/28/2008   SNORING, HX OF 10/04/2007   Hyperlipidemia LDL  goal <100 05/05/2007   Essential hypertension 07/62/2633   METABOLIC SYNDROME X 35/45/6256    Cheryl Owens, PT 09/25/2021, 9:06 AM  [facility], [place], [address] Phone: [phone]   Fax:  [phone]  Name: Cheryl Owens MRN: [id number] Date of Birth: [birth year]

## 2021-09-26 ENCOUNTER — Other Ambulatory Visit (HOSPITAL_COMMUNITY): Payer: Self-pay

## 2021-09-26 ENCOUNTER — Ambulatory Visit (INDEPENDENT_AMBULATORY_CARE_PROVIDER_SITE_OTHER): Payer: No Typology Code available for payment source | Admitting: Medical

## 2021-09-26 VITALS — BP 122/68 | HR 78 | Resp 18 | Ht 67.0 in | Wt 248.6 lb

## 2021-09-26 DIAGNOSIS — M26609 Unspecified temporomandibular joint disorder, unspecified side: Secondary | ICD-10-CM

## 2021-09-26 DIAGNOSIS — R6884 Jaw pain: Secondary | ICD-10-CM | POA: Diagnosis not present

## 2021-09-26 DIAGNOSIS — H6981 Other specified disorders of Eustachian tube, right ear: Secondary | ICD-10-CM | POA: Diagnosis not present

## 2021-09-26 MED ORDER — BACLOFEN 5 MG PO TABS
ORAL_TABLET | ORAL | 0 refills | Status: DC
  Filled 2021-09-26: qty 14, 7d supply, fill #0

## 2021-09-26 NOTE — Patient Instructions (Addendum)
Resist severe persistent tmj syndrome type. Not much improved with flexeril. Stop flexeril. Use baclofen 5 mg twice daily but start on day when not working.  Xray order rt tmj today. Can try to get xray today thru our radiology dept or try thru your dentist.  Continue meloxicam.  For rt ear eustachian tube pressure continue flonase.  Dentist referred to specialist. Explain to them $1400 up front and if more reasonable option.  Advise eating soft foods presently   Follow up one 7-10 days or sooner if needed.

## 2021-09-26 NOTE — Progress Notes (Signed)
Subjective:    Patient ID: Cheryl Owens, female    DOB: 1965/08/26, 56 y.o.   MRN: [id number]  HPI Pt in for tmj.Recently had vist with NP for this.  "Jaw pain- Patient is complaining about right jaw pain for the past 3 weeks. She has a history of clicking in her jaw but had no pain piror. Yawning and eating worsens her pain.  Ear pain- She also complains of having ear pain for several days and has sought out physical therapy. She was prescribed meloxicam '15mg'$  from the ED to manage her pain. She has recently been to the dentist and was recommended to massage her jaw with a cold press. She has been trying to reduce opening her mouth.   A/P TMJ dysfunction - Primary       Uncontrolled. Recommended that she continue meloxicam 7.'5mg'$  once daily for 1 more week. Can ice jaw twice daily. Refer to PT. Rx provided for HS flexeril prn.          Relevant Medications    cyclobenzaprine (FLEXERIL) 5 MG tablet    Other Relevant Orders    Ambulatory referral to Physical Therapy    Eustachian tube dysfunction, right      Recommended trial of flonase 2 sprays each nostril once daily.        Pt has gone to 2 PT treatments. She states when she eats has pain. Tries not to open her mouth to wide to avoid pain. Recent obvious crepitus when she ate in TMJ. PT recently did dry needing to massetter and trapezius muscle.    Pt last saw her dentist for cleaning. She told dentist about pain. Dentist did recommend specialist who treats tmj. That specialist would charge $1400 and not accept insurance.  Pt has called back her dentist office for advise.  Flexeril does make he drowsy. Feels like not helping much.  Denies stress.   Review of Systems  Constitutional:  Negative for chills, fatigue and fever.  Respiratory:  Negative for cough, chest tightness, shortness of breath and wheezing.   Cardiovascular:  Negative for chest pain and palpitations.  Gastrointestinal:  Negative for abdominal pain.   Musculoskeletal:  Negative for back pain.       Rt tmj area pain.  Skin:  Negative for rash.  Hematological:  Negative for adenopathy. Does not bruise/bleed easily.  Psychiatric/Behavioral:  Negative for dysphoric mood. The patient is not nervous/anxious.    Past Medical History:  Diagnosis Date   Anxiety    Cellulitis    diagnosed Sept.7 2021, left upper  posterior arm,  right upper posterior arm   Depression    Dry mouth    Fibroids    Gallbladder disease    High cholesterol    HTN (hypertension) 2007   seen in ER  with headaches   Hypertension    Hypokalemia 2007   due to HCTZ   Nervousness    Obesity    Pre-diabetes    Stress    Swelling of lower extremity    Trouble in sleeping      Social History   Socioeconomic History   Marital status: Single    Spouse name: Not on file   Number of children: 1   Years of education: Not on file   Highest education level: Not on file  Occupational History   Occupation: Cabin crew  Tobacco Use   Smoking status: Never   Smokeless tobacco: Never  Vaping Use   Vaping Use:  Never used  Substance and Sexual Activity   Alcohol use: No   Drug use: No   Sexual activity: Not Currently    Birth control/protection: Surgical    Comment: 1st intercourse- 15, partners- 33, hysterectomy  Other Topics Concern   Not on file  Social History Narrative   Not on file   Social Determinants of Health   Financial Resource Strain: Not on file  Food Insecurity: Not on file  Transportation Needs: Not on file  Physical Activity: Not on file  Stress: Not on file  Social Connections: Not on file  Intimate Partner Violence: Not on file    Past Surgical History:  Procedure Laterality Date   arm surgery     post trauma @ age 53   BREAST SURGERY Right 2016   BREAST BX    CHOLECYSTECTOMY     gall stones   GANGLION CYST EXCISION N/A    wrist   ROBOTIC ASSISTED TOTAL HYSTERECTOMY WITH BILATERAL SALPINGO OOPHERECTOMY Bilateral 11/28/2019    Procedure: XI ROBOTIC [place];  Surgeon: Princess Bruins, MD;  Location: [place];  Service: Gynecology;  Laterality: Bilateral;  request 2 1/2 hours OR time request 8:30am OR start in [place] [facility]   WISDOM TOOTH EXTRACTION      Family History  Problem Relation Age of Onset   Cancer Maternal Grandmother        thyroid   Hypertension Maternal Grandmother    Hyperlipidemia Mother    Hypertension Mother    Depression Mother    Anxiety disorder Mother    Obesity Mother    Breast cancer Maternal Aunt 34   Kidney disease Maternal Aunt         X 2; 1 on Dialysis   Cancer Maternal Aunt        ? primary; also had HTN   Prostate cancer Paternal Uncle          3 uncles   Prostate cancer Father    Cancer Father    Hypertension Paternal Grandmother    Hypertension Maternal Aunt        X 2   Bipolar disorder Paternal Aunt    Stroke Neg Hx    Diabetes Neg Hx    Heart disease Neg Hx    Colon cancer Neg Hx     Allergies  Allergen Reactions   Hydrochlorothiazide     REACTION: hypokalemia   Benazepril Nausea And Vomiting    abd cramps    Current Outpatient Medications on File Prior to Visit  Medication Sig Dispense Refill   atorvastatin (LIPITOR) 20 MG tablet Take 1 tablet (20 mg total) by mouth daily. 90 tablet 3   clonazePAM (KLONOPIN) 0.5 MG tablet TAKE ONE-HALF (1/2) TABLET BY MOUTH IN THE MORNING AND 2 TABLETS AT BEDTIME (Patient taking differently: TAKE ONE-HALF (1/2) TABLET BY MOUTH IN THE MORNING AND 1 TABLETS AT BEDTIME) 225 tablet 1   cyclobenzaprine (FLEXERIL) 5 MG tablet Take 1 tablet (5 mg total) by mouth at bedtime as needed for muscle spasms. 30 tablet 0   escitalopram (LEXAPRO) 10 MG tablet Take 1 tablet by mouth daily. 90 tablet 0   labetalol (NORMODYNE) 100 MG tablet Take 1 tablet (100 mg total) by mouth 2 (two) times daily. 180 tablet 3   meloxicam (MOBIC) 7.5 MG  tablet Take 1 tablet (7.5 mg total) by mouth daily. 30 tablet 0   metFORMIN (GLUCOPHAGE-XR) 500 MG 24 hr tablet  Take 1 tablet (500 mg total) by mouth daily with breakfast. 90 tablet 1   omeprazole (PRILOSEC) 20 MG capsule Take 1 capsule (20 mg total) by mouth daily. 30 capsule 3   Semaglutide-Weight Management (WEGOVY) 1 MG/0.5ML SOAJ Inject 1 mg into the skin once a week. 2 mL 0   spironolactone (ALDACTONE) 25 MG tablet Take 1 tablet (25 mg total) by mouth 2 (two) times daily. 180 tablet 3   Vitamin D, Ergocalciferol, (DRISDOL) 1.25 MG (50000 UNIT) CAPS capsule Take 1 capsule (50,000 Units total) by mouth every 7 (seven) days. 12 capsule 2   No current facility-administered medications on file prior to visit.    BP 122/68   Pulse 78   Resp 18   Ht '5\' 7"'$  (1.702 m)   Wt 248 lb 9.6 oz (112.8 kg)   LMP 12/01/2018 (Approximate)   SpO2 98%   BMI 38.94 kg/m        Objective:   Physical Exam  General Mental Status- Alert. General Appearance- Not in acute distress.   Skin General: Color- Normal Color. Moisture- Normal Moisture.  Neck Rt trapezius mild tender to palpatin.  Chest and Lung Exam Auscultation: Breath Sounds:-Normal.  Cardiovascular Auscultation:Rythm- Regular. Murmurs & Other Heart Sounds:Auscultation of the heart reveals- No Murmurs.  Neurologic Cranial Nerve exam:- CN III-XII intact(No nystagmus), symmetric smile. Strength:- 5/5 equal and symmetric strength both upper and lower extremities.   Heent- rt tm normal. Canal clear. On palpation over rt tmj area mild tenderness. On palpation over jaw not tender presently.    Assessment & Plan:   Patient Instructions  Resist severe persistent tmj syndrome type. Not much improved with flexeril. Stop flexeril. Use baclofen 5 mg twice daily but start on day when not working.  Xray order rt tmj today. Can try to get xray today thru our radiology dept or try thru your dentist.  Continue meloxicam.  For rt ear  eustachian tube pressure continue flonase.  Dentist referred to specialist. Explain to them $1400 up front and if more reasonable option.   Follow up one 7-10 days or sooner if needed.    Mackie Pai, PA-C

## 2021-09-27 ENCOUNTER — Encounter: Payer: Self-pay | Admitting: Family Medicine

## 2021-09-27 ENCOUNTER — Ambulatory Visit: Payer: No Typology Code available for payment source | Admitting: Physical Therapy

## 2021-09-30 ENCOUNTER — Other Ambulatory Visit: Payer: Self-pay

## 2021-09-30 ENCOUNTER — Ambulatory Visit (HOSPITAL_BASED_OUTPATIENT_CLINIC_OR_DEPARTMENT_OTHER)
Admission: RE | Admit: 2021-09-30 | Discharge: 2021-09-30 | Disposition: A | Discharge status: Home / Self Care | Payer: No Typology Code available for payment source | Source: Ambulatory Visit | Attending: Medical | Admitting: Medical

## 2021-09-30 ENCOUNTER — Other Ambulatory Visit (HOSPITAL_COMMUNITY): Payer: Self-pay

## 2021-09-30 DIAGNOSIS — M26609 Unspecified temporomandibular joint disorder, unspecified side: Secondary | ICD-10-CM | POA: Diagnosis present

## 2021-09-30 NOTE — Telephone Encounter (Signed)
Pt called asking for cpt codes to get estimation of x-ray cost. Dx codes on visit are TMJ dysfunction  - Primary M26.609   Eustachian tube dysfunction, right  H69.81   Jaw pain  R68.84    Could you help? Thanks

## 2021-10-01 ENCOUNTER — Encounter: Payer: Self-pay | Admitting: Medical

## 2021-10-02 ENCOUNTER — Ambulatory Visit (HOSPITAL_COMMUNITY): Payer: No Typology Code available for payment source

## 2021-10-03 ENCOUNTER — Ambulatory Visit (INDEPENDENT_AMBULATORY_CARE_PROVIDER_SITE_OTHER): Payer: No Typology Code available for payment source | Admitting: Family

## 2021-10-03 ENCOUNTER — Other Ambulatory Visit (HOSPITAL_COMMUNITY): Payer: Self-pay

## 2021-10-03 VITALS — BP 117/72 | HR 71 | Temp 98.2°F | Resp 16 | Wt 250.0 lb

## 2021-10-03 DIAGNOSIS — H6501 Acute serous otitis media, right ear: Secondary | ICD-10-CM | POA: Insufficient documentation

## 2021-10-03 MED ORDER — AMOXICILLIN 500 MG PO CAPS
500.0000 mg | ORAL_CAPSULE | Freq: Three times a day (TID) | ORAL | 0 refills | Status: AC
  Filled 2021-10-03: qty 30, 10d supply, fill #0

## 2021-10-03 NOTE — Assessment & Plan Note (Signed)
New. Will initiate amoxicillin. Recommended tylenol as needed for pain. She has appointment tomorrow with TMJ specialist for jaw pain.

## 2021-10-03 NOTE — Progress Notes (Signed)
Subjective:     Patient ID: Cheryl Owens, female    DOB: [birth year], 56 y.o.   MRN: [id number]  Chief Complaint  Patient presents with   Otalgia    Patient complains of right ear pain    Otalgia    Patient is in today for right sided ear pain. Tried flonase as directed last visit for eustachian tube dysfunction.  Ear pain woke her up out of her sleep this AM. Took 2 tylenol to try to go back to sleep. Helped a little. Tried some otc ear ache drops without improvement.  She started PT for TMJ dysfunction and has gone twice.  She has an upcoming appointment with a doctor who specializes in TMJ, but it is not covered by insurance.   Health Maintenance Due  Topic Date Due   COVID-19 Vaccine (5 - Pfizer series) 09/28/2020   PAP SMEAR-Modifier  08/19/2021    Past Medical History:  Diagnosis Date   Anxiety    Cellulitis    diagnosed Sept.7 2021, left upper  posterior arm,  right upper posterior arm   Depression    Dry mouth    Fibroids    Gallbladder disease    High cholesterol    HTN (hypertension) 2007   seen in ER  with headaches   Hypertension    Hypokalemia 2007   due to HCTZ   Nervousness    Obesity    Pre-diabetes    Stress    Swelling of lower extremity    Trouble in sleeping     Past Surgical History:  Procedure Laterality Date   arm surgery     post trauma @ age 56   BREAST SURGERY Right 2016   BREAST BX    CHOLECYSTECTOMY     gall stones   GANGLION CYST EXCISION N/A    wrist   ROBOTIC ASSISTED TOTAL HYSTERECTOMY WITH BILATERAL SALPINGO OOPHERECTOMY Bilateral 11/28/2019   Procedure: XI ROBOTIC ASSISTED TOTAL LAPAROSCOPIC HYSTERECTOMY WITH BILATERAL SALPINGO OOPHORECTOMY;  Surgeon: Princess Bruins, MD;  Location: [place];  Service: Gynecology;  Laterality: Bilateral;  request 2 1/2 hours OR time request 8:30am OR start in [place] [facility]   WISDOM TOOTH EXTRACTION      Family History  Problem Relation Age of Onset   Cancer  Maternal Grandmother        thyroid   Hypertension Maternal Grandmother    Hyperlipidemia Mother    Hypertension Mother    Depression Mother    Anxiety disorder Mother    Obesity Mother    Breast cancer Maternal Aunt 32   Kidney disease Maternal Aunt         X 2; 1 on Dialysis   Cancer Maternal Aunt        ? primary; also had HTN   Prostate cancer Paternal Uncle          3 uncles   Prostate cancer Father    Cancer Father    Hypertension Paternal Grandmother    Hypertension Maternal Aunt        X 2   Bipolar disorder Paternal Aunt    Stroke Neg Hx    Diabetes Neg Hx    Heart disease Neg Hx    Colon cancer Neg Hx     Social History   Socioeconomic History   Marital status: Single    Spouse name: Not on file   Number of children: 1   Years of education: Not on file  Highest education level: Not on file  Occupational History   Occupation: Cabin crew  Tobacco Use   Smoking status: Never   Smokeless tobacco: Never  Vaping Use   Vaping Use: Never used  Substance and Sexual Activity   Alcohol use: No   Drug use: No   Sexual activity: Not Currently    Birth control/protection: Surgical    Comment: 1st intercourse- 5, partners- 63, hysterectomy  Other Topics Concern   Not on file  Social History Narrative   Not on file   Social Determinants of Health   Financial Resource Strain: Not on file  Food Insecurity: Not on file  Transportation Needs: Not on file  Physical Activity: Not on file  Stress: Not on file  Social Connections: Not on file  Intimate Partner Violence: Not on file    Outpatient Medications Prior to Visit  Medication Sig Dispense Refill   atorvastatin (LIPITOR) 20 MG tablet Take 1 tablet (20 mg total) by mouth daily. 90 tablet 3   Baclofen 5 MG TABS Take 1 tablet by mouth twice a day 14 tablet 0   clonazePAM (KLONOPIN) 0.5 MG tablet TAKE ONE-HALF (1/2) TABLET BY MOUTH IN THE MORNING AND 2 TABLETS AT BEDTIME (Patient taking differently: TAKE  ONE-HALF (1/2) TABLET BY MOUTH IN THE MORNING AND 1 TABLETS AT BEDTIME) 225 tablet 1   cyclobenzaprine (FLEXERIL) 5 MG tablet Take 1 tablet (5 mg total) by mouth at bedtime as needed for muscle spasms. 30 tablet 0   escitalopram (LEXAPRO) 10 MG tablet Take 1 tablet by mouth daily. 90 tablet 0   labetalol (NORMODYNE) 100 MG tablet Take 1 tablet (100 mg total) by mouth 2 (two) times daily. 180 tablet 3   meloxicam (MOBIC) 7.5 MG tablet Take 1 tablet (7.5 mg total) by mouth daily. 30 tablet 0   metFORMIN (GLUCOPHAGE-XR) 500 MG 24 hr tablet Take 1 tablet (500 mg total) by mouth daily with breakfast. 90 tablet 1   omeprazole (PRILOSEC) 20 MG capsule Take 1 capsule (20 mg total) by mouth daily. 30 capsule 3   Semaglutide-Weight Management (WEGOVY) 1 MG/0.5ML SOAJ Inject 1 mg into the skin once a week. 2 mL 0   spironolactone (ALDACTONE) 25 MG tablet Take 1 tablet (25 mg total) by mouth 2 (two) times daily. 180 tablet 3   Vitamin D, Ergocalciferol, (DRISDOL) 1.25 MG (50000 UNIT) CAPS capsule Take 1 capsule (50,000 Units total) by mouth every 7 (seven) days. 12 capsule 2   No facility-administered medications prior to visit.    Allergies  Allergen Reactions   Hydrochlorothiazide     REACTION: hypokalemia   Benazepril Nausea And Vomiting    abd cramps    Review of Systems  HENT:  Positive for ear pain.        Objective:    Physical Exam Constitutional:      General: She is not in acute distress.    Appearance: Normal appearance. She is well-developed.  HENT:     Head: Normocephalic and atraumatic.     Right Ear: Ear canal and external ear normal. A middle ear effusion (bubbles noted behind TM) is present. Tympanic membrane is erythematous (mild) and retracted. Tympanic membrane is not bulging.     Left Ear: Tympanic membrane, ear canal and external ear normal.  Eyes:     General: No scleral icterus. Neck:     Thyroid: No thyromegaly.  Cardiovascular:     Rate and Rhythm: Normal rate  and regular rhythm.  Heart sounds: Normal heart sounds. No murmur heard. Pulmonary:     Effort: Pulmonary effort is normal. No respiratory distress.     Breath sounds: Normal breath sounds. No wheezing.  Musculoskeletal:     Cervical back: Neck supple.  Skin:    General: Skin is warm and dry.  Neurological:     Mental Status: She is alert and oriented to person, place, and time.  Psychiatric:        Mood and Affect: Mood normal.        Behavior: Behavior normal.        Thought Content: Thought content normal.        Judgment: Judgment normal.     BP 117/72 (BP Location: Right Arm, Patient Position: Sitting, Cuff Size: Large)   Pulse 71   Temp 98.2 F (36.8 C) (Oral)   Resp 16   Wt 250 lb (113.4 kg)   LMP 12/01/2018 (Approximate)   SpO2 99%   BMI 39.16 kg/m  Wt Readings from Last 3 Encounters:  10/03/21 250 lb (113.4 kg)  09/26/21 248 lb 9.6 oz (112.8 kg)  09/11/21 253 lb (114.8 kg)       Assessment & Plan:   Problem List Items Addressed This Visit       Unprioritized   Right acute serous otitis media - Primary    New. Will initiate amoxicillin. Recommended tylenol as needed for pain. She has appointment tomorrow with TMJ specialist for jaw pain.      Relevant Medications   amoxicillin (AMOXIL) 500 MG capsule    I am having Siria M. Bartel "[name]" start on amoxicillin. I am also having her maintain her atorvastatin, spironolactone, labetalol, omeprazole, Vitamin D (Ergocalciferol), clonazePAM, metFORMIN, escitalopram, meloxicam, cyclobenzaprine, Wegovy, and Baclofen.  Meds ordered this encounter  Medications   amoxicillin (AMOXIL) 500 MG capsule    Sig: Take 1 capsule by mouth 3 times daily for 10 days.    Dispense:  30 capsule    Refill:  0    Order Specific Question:   Supervising Provider    Answer:   Penni Homans A [[id number]]

## 2021-10-03 NOTE — Patient Instructions (Signed)
Please begin amoxicillin.

## 2021-10-04 ENCOUNTER — Ambulatory Visit: Payer: No Typology Code available for payment source | Admitting: Family Medicine

## 2021-10-04 ENCOUNTER — Encounter: Payer: Self-pay | Admitting: Physical Therapy

## 2021-10-04 ENCOUNTER — Ambulatory Visit: Payer: No Typology Code available for payment source | Admitting: Physical Therapy

## 2021-10-04 DIAGNOSIS — M26609 Unspecified temporomandibular joint disorder, unspecified side: Secondary | ICD-10-CM | POA: Diagnosis not present

## 2021-10-04 DIAGNOSIS — R29898 Other symptoms and signs involving the musculoskeletal system: Secondary | ICD-10-CM

## 2021-10-04 DIAGNOSIS — R293 Abnormal posture: Secondary | ICD-10-CM

## 2021-10-04 DIAGNOSIS — R29810 Facial weakness: Secondary | ICD-10-CM

## 2021-10-04 NOTE — Therapy (Addendum)
[name], [place], [address] Phone: [phone]   Fax:  [phone]  Physical Therapy Treatment  Patient Details  Name: Cheryl Owens MRN: [id number] Date of Birth: [birth year] Referring Provider (PT): [name], NP   Encounter Date: 10/04/2021 Rationale for Evaluation and Treatment Rehabilitation   PT End of Session - 10/04/21 2585     Visit Number 3    Number of Visits 8    Date for PT Re-Evaluation 11/19/21    PT Start Time 0800    PT Stop Time 0838    PT Time Calculation (min) 38 min    Behavior During Therapy [facility] for tasks assessed/performed             Past Medical History:  Diagnosis Date   Anxiety    Cellulitis    diagnosed Sept.7 2021, left upper  posterior arm,  right upper posterior arm   Depression    Dry mouth    Fibroids    Gallbladder disease    High cholesterol    HTN (hypertension) 2007   seen in ER  with headaches   Hypertension    Hypokalemia 2007   due to HCTZ   Nervousness    Obesity    Pre-diabetes    Stress    Swelling of lower extremity    Trouble in sleeping     Past Surgical History:  Procedure Laterality Date   arm surgery     post trauma @ age 56   BREAST SURGERY Right 2016   BREAST BX    CHOLECYSTECTOMY     gall stones   GANGLION CYST EXCISION N/A    wrist   ROBOTIC ASSISTED TOTAL HYSTERECTOMY WITH BILATERAL SALPINGO OOPHERECTOMY Bilateral 11/28/2019   Procedure: XI ROBOTIC ASSISTED TOTAL LAPAROSCOPIC HYSTERECTOMY WITH BILATERAL SALPINGO OOPHORECTOMY;  Surgeon: Princess Bruins, MD;  Location: [facility];  Service: Gynecology;  Laterality: Bilateral;  request 2 1/2 hours OR time request 8:30am OR start in [place] [facility] block   WISDOM TOOTH EXTRACTION      There were no vitals filed for this visit.   Subjective Assessment - 10/04/21 0801     Subjective Pt states she feels "about the same" she made an appointment  with a TMJ specialist dentist who she weill see on Monday    Patient Stated Goals Pain free, be able to return to eating regular diet    Currently in Pain? Yes    Pain Score 2     Pain Location Jaw    Pain Orientation Right    Pain Descriptors / Indicators Sore                [facility] PT Assessment - 10/04/21 0001       Assessment   Medical Diagnosis TMJ dysfunction    Referring Provider (PT) [name], NP    Onset Date/Surgical Date 09/11/21    Hand Dominance Right                           [facility] Adult PT Treatment/Exercise - 10/04/21 0001       Neck Exercises: Stretches   Upper Trapezius Stretch Right;Left;2 reps;30 seconds    Neck Stretch 2 reps;30 seconds    Neck Stretch Limitations clavicle/first rib depression + lateral tilt + rotation for anterior neck stretching      Shoulder Exercises: Seated   Other Seated Exercises seated W x  10, chin tuck x 10      Shoulder Exercises: Standing   Extension 20 reps    Theraband Level (Shoulder Extension) Level 1 (Yellow)    Row 20 reps    Theraband Level (Shoulder Row) Level 1 (Yellow)      Shoulder Exercises: Stretch   Corner Stretch Limitations doorway stretch W and V 2 x 30 sec each      Manual Therapy   Soft tissue mobilization bilateral suboccipital release, PROM neck, sidebending, and STM for masseter and suprahyoid musculature                       PT Short Term Goals - 09/20/21 1203       PT SHORT TERM GOAL #1   Title The patient will be indep with initial HEP.    Time 4    Period Weeks    Target Date 10/20/21               PT Long Term Goals - 09/20/21 1204       PT LONG TERM GOAL #1   Title The patient will be indep with progression of HEP.    Time 8    Period Weeks    Target Date 11/19/21      PT LONG TERM GOAL #2   Title The patient will report return to eating a sandwich without jaw pain.    Time 6    Period Weeks    Target Date 11/19/21      PT  LONG TERM GOAL #3   Title The patient will improve jaw opening from 4cm to 6cm without pain.    Time 8    Period Weeks    Target Date 11/19/21      PT LONG TERM GOAL #4   Title The patient will demonstrate improved upright posture for awareness during work and daily activities.    Time 8    Period Weeks    Target Date 11/19/21                   Plan - 10/04/21 0837     Clinical Impression Statement Pt continues with mm spasticity throughout neck and jaw musculature. Good response to manual therapy. Progressed postural strenght with bands with good tolerance    PT Next Visit Plan postural strengthening, manual as indicated    PT Home Exercise Plan [id number]    Consulted and Agree with Plan of Care Patient             Patient will benefit from skilled therapeutic intervention in order to improve the following deficits and impairments:     Visit Diagnosis: Abnormal posture  Facial weakness  Other symptoms and signs involving the musculoskeletal system     Problem List Patient Active Problem List   Diagnosis Date Noted   Right acute serous otitis media 10/03/2021   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   Eustachian tube dysfunction, right 09/11/2021   Dysphagia 12/26/2020   Postoperative state 11/28/2019   Cellulitis of right upper extremity 11/17/2019   Impacted cerumen of left ear 03/29/2019   Otitis externa 03/29/2019   Prediabetes 03/06/2019   Class 2 severe obesity with serious comorbidity and body mass index (BMI) of 36.0 to 36.9 in adult ([place]) 01/17/2019   Stage 3a chronic kidney disease ([place]) 12/23/2018   Vitamin D deficiency 12/23/2018   Severe major depression without psychotic features ([place]) 11/10/2017   Preventative health care 06/08/2017  Elevated serum creatinine 03/13/2017   Depression 04/19/2015   Uterine fibroid 04/13/2014   Plantar fasciitis 04/13/2014   Positive urine drug screen 09/29/2013   Nausea alone 09/13/2013   Hypotension, unspecified  09/13/2013   Anxiety state 09/13/2013   Diarrhea 09/13/2013   Obesity (BMI 30-39.9) 02/15/2013   FASTING HYPERGLYCEMIA 09/28/2008   SNORING, HX OF 10/04/2007   Hyperlipidemia LDL goal <100 05/05/2007   Essential hypertension 37/44/5146   METABOLIC SYNDROME X 04/79/9872   PHYSICAL THERAPY DISCHARGE SUMMARY  Visits from Start of Care: 3  Current functional level related to goals / functional outcomes: Improved postural strength   Remaining deficits: pain   Education / Equipment: HEP   Patient agrees to discharge. Patient goals were not met. Patient is being discharged due to not returning since the last visit.  Isabelle Course, PT,DPT08/29/2311:30 AM   Isabelle Course, PT 10/04/2021, 8:39 AM  [facility] [place], [place], [address] Phone: [phone]   Fax:  [phone]  Name: BRIDGET Be[place] MRN: [id number] Date of Birth: 01-09-1966

## 2021-10-10 ENCOUNTER — Other Ambulatory Visit (HOSPITAL_COMMUNITY): Payer: Self-pay

## 2021-10-10 ENCOUNTER — Encounter: Payer: Self-pay | Admitting: Psychiatry

## 2021-10-10 ENCOUNTER — Other Ambulatory Visit: Payer: Self-pay | Admitting: General Practice

## 2021-10-10 ENCOUNTER — Ambulatory Visit (INDEPENDENT_AMBULATORY_CARE_PROVIDER_SITE_OTHER): Payer: No Typology Code available for payment source | Admitting: Psychiatry

## 2021-10-10 DIAGNOSIS — F5105 Insomnia due to other mental disorder: Secondary | ICD-10-CM

## 2021-10-10 DIAGNOSIS — F4001 Agoraphobia with panic disorder: Secondary | ICD-10-CM

## 2021-10-10 DIAGNOSIS — F3342 Major depressive disorder, recurrent, in full remission: Secondary | ICD-10-CM | POA: Diagnosis not present

## 2021-10-10 DIAGNOSIS — F411 Generalized anxiety disorder: Secondary | ICD-10-CM

## 2021-10-10 DIAGNOSIS — F458 Other somatoform disorders: Secondary | ICD-10-CM

## 2021-10-10 DIAGNOSIS — M26631 Articular disc disorder of right temporomandibular joint: Secondary | ICD-10-CM

## 2021-10-10 MED ORDER — METHOCARBAMOL 500 MG PO TABS
500.0000 mg | ORAL_TABLET | Freq: Four times a day (QID) | ORAL | 0 refills | Status: DC | PRN
  Filled 2021-10-10: qty 60, 15d supply, fill #0

## 2021-10-10 MED ORDER — CLONAZEPAM 0.5 MG PO TABS
ORAL_TABLET | ORAL | 0 refills | Status: DC
  Filled 2021-10-10: qty 135, fill #0
  Filled 2022-01-19 – 2022-02-03 (×2): qty 135, 90d supply, fill #0

## 2021-10-10 NOTE — Progress Notes (Signed)
Cheryl Owens [id number] 1965-07-01 56 y.o.  Subjective:   Patient ID:  Cheryl Owens is a 56 y.o. (DOB Dec 19, 1965) female.  Chief Complaint:  Chief Complaint  Patient presents with   Follow-up    Depression        Associated symptoms include no decreased concentration and no suicidal ideas.  Cheryl Owens presents to the office today for follow-up of depression and anxiety and history of insomnia and Bz withdrawal.    seen in June and December 2020.  No meds were changed.  She remained on Wellbutrin XL 300 mg, Lexapro 10 mg, vitamin D, and clonazepam 0.5 mg twice daily as needed.  Usually takes clonazepam 0.5 mg 1/2 AM and 1 in PM.  02/06/2020 appointment with the following noted: Still doing well overall.  Hasn't tried to reduce.  Don't want to ever go back to feeling like she did with the episode. Laid off and another person also.  Had 33 years with the company.  Got 6 mos severance pay.  Needs another job but not desperate over it.  Made money with real estate sales.  Hysterectomy went well. Overall feels ok about things but doesn't want to stop meds.  No panic. Recognizes some social anxiety.   Better with weight control.  Doing Healthy Weight and Wellness including doctor including mental health provider and counseling to help weight loss.   Work life balance is better overall. Plan: No med changes  07/31/2020 appointment with the following noted: Got laid off after 34 years and 9 mos. Hasn't found another job yet.  Would rather change fields.  Look for low stress and low responsibility. Also worried about agism.  Has interview tomorrow with [facility] physicians.  Not stretched for money.  Doesn't want to manage anyone or have too much responsibility. Emotionally things are pretty much the same.  D doing well with job.   No concerns with meds.   Patient reports stable mood and denies depressed or irritable moods.  Patient denies any recent difficulty with anxiety.  Patient denies  difficulty with sleep initiation or maintenance. 6 hours; hard to let go of phone.  Denies appetite disturbance.  Patient reports that energy and motivation have been good.  Patient denies any difficulty with concentration.  Patient denies any suicidal ideation. No panic. Hx noncompliance bc forgets.  Is using a pill box, and is aware if she forgets it. Fearful to decrease meds. Plan no changes  04/03/2021 appointment with the following noted: Feel pretty good.  Some concerns about stopping meds. Some family stress. Disc weight loss.   No panic.   Not depressed. Pending endo. Lives alone. Not sleepy during the day. But D concerned that she might have OSA.  Loud snoring. Patient reports stable mood and denies depressed or irritable moods.  Patient denies any recent difficulty with anxiety.  Patient denies difficulty with sleep initiation or maintenance. Denies appetite disturbance.  Patient reports that energy and motivation have been good.  Patient denies any difficulty with concentration.  Patient denies any suicidal ideation.  10/10/21 appt noted: No problems off the Wellbutrin. Just on Lexapro 10 and clonzaepm 0.5 mg 1/2 in AM and 1 at night. 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  Tried PT, dry needling, and a shot.  Hurts so much wants to cry.  Started Amoxicillin for infection and Advil. Wonders if Lexapro is causing teeth clenching. Tried Flexeril 5 mg sleepy and limited benefit. Emotionally feels fine.  No dep or  anxiety.   Past Psychiatric Medication Trials:  Wellbutrin, Lexapro 10 clonazepam,   Adderall,   Episode depression early life after D born and then again later.  Review of Systems:  Review of Systems  HENT:         Jaw tightness and pain  Respiratory:  Positive for chest tightness.   Cardiovascular:  Negative for chest pain and palpitations.  Neurological:  Negative for tremors and weakness.  Psychiatric/Behavioral:  Negative for agitation, behavioral problems,  confusion, decreased concentration, dysphoric mood, self-injury, sleep disturbance and suicidal ideas. The patient is not nervous/anxious and is not hyperactive.     Medications: I have reviewed the patient's current medications.  Current Outpatient Medications  Medication Sig Dispense Refill   amoxicillin (AMOXIL) 500 MG capsule Take 1 capsule by mouth 3 times daily for 10 days. 30 capsule 0   atorvastatin (LIPITOR) 20 MG tablet Take 1 tablet (20 mg total) by mouth daily. 90 tablet 3   Baclofen 5 MG TABS Take 1 tablet by mouth twice a day 14 tablet 0   cyclobenzaprine (FLEXERIL) 5 MG tablet Take 1 tablet (5 mg total) by mouth at bedtime as needed for muscle spasms. 30 tablet 0   escitalopram (LEXAPRO) 10 MG tablet Take 1 tablet by mouth daily. 90 tablet 0   labetalol (NORMODYNE) 100 MG tablet Take 1 tablet (100 mg total) by mouth 2 (two) times daily. 180 tablet 3   meloxicam (MOBIC) 7.5 MG tablet Take 1 tablet (7.5 mg total) by mouth daily. 30 tablet 0   metFORMIN (GLUCOPHAGE-XR) 500 MG 24 hr tablet Take 1 tablet (500 mg total) by mouth daily with breakfast. 90 tablet 1   methocarbamol (ROBAXIN) 500 MG tablet Take 1 tablet (500 mg total) by mouth every 6 (six) hours as needed for muscle spasms. 60 tablet 0   omeprazole (PRILOSEC) 20 MG capsule Take 1 capsule (20 mg total) by mouth daily. 30 capsule 3   Semaglutide-Weight Management (WEGOVY) 1 MG/0.5ML SOAJ Inject 1 mg into the skin once a week. 2 mL 0   spironolactone (ALDACTONE) 25 MG tablet Take 1 tablet (25 mg total) by mouth 2 (two) times daily. 180 tablet 3   Vitamin D, Ergocalciferol, (DRISDOL) 1.25 MG (50000 UNIT) CAPS capsule Take 1 capsule (50,000 Units total) by mouth every 7 (seven) days. 12 capsule 2   clonazePAM (KLONOPIN) 0.5 MG tablet TAKE ONE-HALF (1/2) TABLET BY MOUTH IN THE MORNING AND 1 TABLETS AT BEDTIME 135 tablet 0   No current facility-administered medications for this visit.    Medication Side Effects:  None  Allergies:  Allergies  Allergen Reactions   Hydrochlorothiazide     REACTION: hypokalemia   Benazepril Nausea And Vomiting    abd cramps    Past Medical History:  Diagnosis Date   Anxiety    Cellulitis    diagnosed Sept.7 2021, left upper  posterior arm,  right upper posterior arm   Depression    Dry mouth    Fibroids    Gallbladder disease    High cholesterol    HTN (hypertension) 2007   seen in ER  with headaches   Hypertension    Hypokalemia 2007   due to HCTZ   Nervousness    Obesity    Pre-diabetes    Stress    Swelling of lower extremity    Trouble in sleeping     Family History  Problem Relation Age of Onset   Cancer Maternal Grandmother  thyroid   Hypertension Maternal Grandmother    Hyperlipidemia Mother    Hypertension Mother    Depression Mother    Anxiety disorder Mother    Obesity Mother    Breast cancer Maternal Aunt 36   Kidney disease Maternal Aunt         X 2; 1 on Dialysis   Cancer Maternal Aunt        ? primary; also had HTN   Prostate cancer Paternal Uncle          3 uncles   Prostate cancer Father    Cancer Father    Hypertension Paternal Grandmother    Hypertension Maternal Aunt        X 2   Bipolar disorder Paternal Aunt    Stroke Neg Hx    Diabetes Neg Hx    Heart disease Neg Hx    Colon cancer Neg Hx     Social History   Socioeconomic History   Marital status: Single    Spouse name: Not on file   Number of children: 1   Years of education: Not on file   Highest education level: Not on file  Occupational History   Occupation: Cabin crew  Tobacco Use   Smoking status: Never   Smokeless tobacco: Never  Vaping Use   Vaping Use: Never used  Substance and Sexual Activity   Alcohol use: No   Drug use: No   Sexual activity: Not Currently    Birth control/protection: Surgical    Comment: 1st intercourse- 65, partners- 49, hysterectomy  Other Topics Concern   Not on file  Social History Narrative    Not on file   Social Determinants of Health   Financial Resource Strain: Not on file  Food Insecurity: Not on file  Transportation Needs: Not on file  Physical Activity: Not on file  Stress: Not on file  Social Connections: Not on file  Intimate Partner Violence: Not on file    Past Medical History, Surgical history, Social history, and Family history were reviewed and updated as appropriate.   Please see review of systems for further details on the patient's review from today.   Objective:   Physical Exam:  LMP 12/01/2018 (Approximate)   Physical Exam Constitutional:      General: She is not in acute distress.    Appearance: She is well-developed.  Musculoskeletal:        General: No deformity.  Neurological:     Mental Status: She is alert and oriented to person, place, and time.     Motor: No tremor.     Coordination: Coordination normal.     Gait: Gait normal.  Psychiatric:        Attention and Perception: She is attentive. She does not perceive auditory or visual hallucinations.        Mood and Affect: Mood is not anxious or depressed. Affect is not labile, angry, tearful or inappropriate.        Speech: Speech normal.        Behavior: Behavior normal.        Thought Content: Thought content normal. Thought content is not delusional. Thought content does not include homicidal or suicidal ideation. Thought content does not include suicidal plan.        Cognition and Memory: Cognition normal.        Judgment: Judgment normal.     Comments: Insight intact. No auditory or visual hallucinations. No delusions.  Lab Review:     Component Value Date/Time   NA 138 12/25/2020 1817   NA 143 12/08/2018 0820   K 4.2 12/25/2020 1817   CL 104 12/25/2020 1817   CO2 27 12/25/2020 1817   GLUCOSE 88 12/25/2020 1817   BUN 17 12/25/2020 1817   BUN 12 12/08/2018 0820   CREATININE 1.43 (H) 12/25/2020 1817   CREATININE 1.44 (H) 11/07/2019 0848   CALCIUM 9.7 12/25/2020  1817   PROT 7.5 12/25/2020 1817   PROT 6.4 12/08/2018 0820   ALBUMIN 4.4 12/25/2020 1817   ALBUMIN 4.0 12/08/2018 0820   AST 18 12/25/2020 1817   ALT 16 12/25/2020 1817   ALKPHOS 141 (H) 12/25/2020 1817   BILITOT 0.4 12/25/2020 1817   BILITOT 0.4 12/08/2018 0820   GFRNONAA 53 (L) 11/18/2019 1446   GFRAA >60 11/18/2019 1446       Component Value Date/Time   WBC 7.9 12/25/2020 1817   RBC 4.61 12/25/2020 1817   HGB 12.5 12/25/2020 1817   HGB 12.8 05/05/2018 1252   HCT 38.7 12/25/2020 1817   HCT 40.4 05/05/2018 1252   PLT 275.0 12/25/2020 1817   MCV 84.0 12/25/2020 1817   MCV 86 05/05/2018 1252   MCH 28.2 11/28/2019 1403   MCHC 32.3 12/25/2020 1817   RDW 14.7 12/25/2020 1817   RDW 13.6 05/05/2018 1252   LYMPHSABS 2.6 12/25/2020 1817   LYMPHSABS 2.1 05/05/2018 1252   MONOABS 0.6 12/25/2020 1817   EOSABS 0.1 12/25/2020 1817   EOSABS 0.1 05/05/2018 1252   BASOSABS 0.1 12/25/2020 1817   BASOSABS 0.0 05/05/2018 1252    No results found for: "POCLITH", "LITHIUM"   No results found for: "PHENYTOIN", "PHENOBARB", "VALPROATE", "CBMZ"   Corrected low vitamin D 2020  .res Assessment: Plan:    Cheryl Owens was seen today for follow-up.  Diagnoses and all orders for this visit:  Generalized anxiety disorder  Depression, major, recurrent, in complete remission ([place])  Panic disorder with agoraphobia -     clonazePAM (KLONOPIN) 0.5 MG tablet; TAKE ONE-HALF (1/2) TABLET BY MOUTH IN THE MORNING AND 1 TABLETS AT BEDTIME  Insomnia due to mental condition -     clonazePAM (KLONOPIN) 0.5 MG tablet; TAKE ONE-HALF (1/2) TABLET BY MOUTH IN THE MORNING AND 1 TABLETS AT BEDTIME  Bruxism -     methocarbamol (ROBAXIN) 500 MG tablet; Take 1 tablet (500 mg total) by mouth every 6 (six) hours as needed for muscle spasms.    Greater than 50% of face to face time with patient was spent on counseling and coordination of care. .  Could also help with her weight loss which is a goal.  Has gotten much  better at setting boundaries with work and not overworking.  Mood is therefore better. Discussed meds and SE.   We discussed the short-term risks associated with benzodiazepines including sedation and increased fall risk among others.  Discussed long-term side effect risk including dependence, potential withdrawal symptoms, and the potential eventual dose-related risk of dementia. She feels that she is on the lowest effective dose.  Doing well. Wean Lexapro over a couple of weeks to see if it is causing bruxism.  Since Flexeril too sedating. Option trial Robaxin in place of Flexeril in hopes of less sedation.   This appt was 30 mins.  FU 2 mos.  Cheryl Parents, MD, DFAPA   Please see After Visit Summary for patient specific instructions.  Future Appointments  Date Time Provider Elaine  10/18/2021  8:00  AM Kennith Gain, PT [facility]  11/26/2021 10:20 AM Etter Sjogren Koren Shiver, DO [facility]     No orders of the defined types were placed in this encounter.     -------------------------------

## 2021-10-11 ENCOUNTER — Encounter: Payer: No Typology Code available for payment source | Admitting: Physical Therapy

## 2021-10-14 ENCOUNTER — Other Ambulatory Visit: Payer: Self-pay | Admitting: Family Medicine

## 2021-10-14 ENCOUNTER — Other Ambulatory Visit (HOSPITAL_COMMUNITY): Payer: Self-pay

## 2021-10-14 MED ORDER — WEGOVY 1.7 MG/0.75ML ~~LOC~~ SOAJ
1.7000 mg | SUBCUTANEOUS | 0 refills | Status: DC
  Filled 2021-10-14: qty 3, 28d supply, fill #0

## 2021-10-16 ENCOUNTER — Encounter (INDEPENDENT_AMBULATORY_CARE_PROVIDER_SITE_OTHER): Payer: Self-pay

## 2021-10-18 ENCOUNTER — Encounter: Payer: Self-pay | Admitting: Physical Therapy

## 2021-10-22 ENCOUNTER — Other Ambulatory Visit (HOSPITAL_COMMUNITY): Payer: Self-pay

## 2021-10-26 ENCOUNTER — Other Ambulatory Visit: Payer: No Typology Code available for payment source

## 2021-10-27 ENCOUNTER — Ambulatory Visit
Admission: RE | Admit: 2021-10-27 | Discharge: 2021-10-27 | Disposition: A | Discharge status: Home / Self Care | Payer: No Typology Code available for payment source | Source: Ambulatory Visit | Attending: General Practice | Admitting: General Practice

## 2021-10-27 ENCOUNTER — Other Ambulatory Visit: Payer: No Typology Code available for payment source

## 2021-10-27 DIAGNOSIS — M26631 Articular disc disorder of right temporomandibular joint: Secondary | ICD-10-CM

## 2021-11-12 ENCOUNTER — Other Ambulatory Visit (HOSPITAL_COMMUNITY): Payer: Self-pay

## 2021-11-12 ENCOUNTER — Other Ambulatory Visit: Payer: Self-pay | Admitting: Family Medicine

## 2021-11-13 ENCOUNTER — Other Ambulatory Visit: Payer: Self-pay | Admitting: Obstetrics & Gynecology

## 2021-11-13 ENCOUNTER — Other Ambulatory Visit (HOSPITAL_COMMUNITY): Payer: Self-pay

## 2021-11-13 DIAGNOSIS — Z1231 Encounter for screening mammogram for malignant neoplasm of breast: Secondary | ICD-10-CM

## 2021-11-13 MED ORDER — WEGOVY 2.4 MG/0.75ML ~~LOC~~ SOAJ
2.4000 mg | SUBCUTANEOUS | 3 refills | Status: DC
Start: 2021-11-13 — End: 2022-02-24
  Filled 2021-11-13: qty 3, 28d supply, fill #0
  Filled 2021-12-11: qty 3, 28d supply, fill #1
  Filled 2022-01-09: qty 3, 28d supply, fill #2
  Filled 2022-02-10: qty 3, 28d supply, fill #3

## 2021-11-14 ENCOUNTER — Other Ambulatory Visit (HOSPITAL_COMMUNITY): Payer: Self-pay

## 2021-11-19 ENCOUNTER — Encounter: Payer: No Typology Code available for payment source | Admitting: Family Medicine

## 2021-11-26 ENCOUNTER — Encounter: Payer: No Typology Code available for payment source | Admitting: Family Medicine

## 2021-12-06 ENCOUNTER — Encounter: Payer: No Typology Code available for payment source | Admitting: Family Medicine

## 2021-12-11 ENCOUNTER — Other Ambulatory Visit: Payer: Self-pay | Admitting: Psychiatry

## 2021-12-11 DIAGNOSIS — F458 Other somatoform disorders: Secondary | ICD-10-CM

## 2021-12-12 ENCOUNTER — Other Ambulatory Visit (HOSPITAL_COMMUNITY): Payer: Self-pay

## 2021-12-12 MED ORDER — METHOCARBAMOL 500 MG PO TABS
500.0000 mg | ORAL_TABLET | Freq: Four times a day (QID) | ORAL | 4 refills | Status: DC | PRN
  Filled 2021-12-12: qty 60, 15d supply, fill #0

## 2021-12-27 ENCOUNTER — Ambulatory Visit: Payer: No Typology Code available for payment source

## 2021-12-28 ENCOUNTER — Other Ambulatory Visit: Payer: Self-pay | Admitting: Family Medicine

## 2021-12-28 DIAGNOSIS — E559 Vitamin D deficiency, unspecified: Secondary | ICD-10-CM

## 2022-01-02 ENCOUNTER — Ambulatory Visit (INDEPENDENT_AMBULATORY_CARE_PROVIDER_SITE_OTHER): Payer: No Typology Code available for payment source | Admitting: Family Medicine

## 2022-01-02 ENCOUNTER — Encounter: Payer: Self-pay | Admitting: Family Medicine

## 2022-01-02 VITALS — BP 110/70 | HR 76 | Temp 98.9°F | Resp 18 | Ht 67.0 in | Wt 230.8 lb

## 2022-01-02 DIAGNOSIS — I10 Essential (primary) hypertension: Secondary | ICD-10-CM

## 2022-01-02 DIAGNOSIS — N1831 Chronic kidney disease, stage 3a: Secondary | ICD-10-CM

## 2022-01-02 DIAGNOSIS — R7303 Prediabetes: Secondary | ICD-10-CM

## 2022-01-02 DIAGNOSIS — E785 Hyperlipidemia, unspecified: Secondary | ICD-10-CM

## 2022-01-02 DIAGNOSIS — E559 Vitamin D deficiency, unspecified: Secondary | ICD-10-CM

## 2022-01-02 DIAGNOSIS — Z0000 Encounter for general adult medical examination without abnormal findings: Secondary | ICD-10-CM | POA: Diagnosis not present

## 2022-01-02 DIAGNOSIS — E669 Obesity, unspecified: Secondary | ICD-10-CM

## 2022-01-02 LAB — CBC WITH DIFFERENTIAL/PLATELET
Basophils Absolute: 0 10*3/uL (ref 0.0–0.1)
Basophils Relative: 0.5 % (ref 0.0–3.0)
Eosinophils Absolute: 0.1 10*3/uL (ref 0.0–0.7)
Eosinophils Relative: 1.1 % (ref 0.0–5.0)
HCT: 37.8 % (ref 36.0–46.0)
Hemoglobin: 12 g/dL (ref 12.0–15.0)
Lymphocytes Relative: 20 % (ref 12.0–46.0)
Lymphs Abs: 1.6 10*3/uL (ref 0.7–4.0)
MCHC: 31.8 g/dL (ref 30.0–36.0)
MCV: 83.3 fl (ref 78.0–100.0)
Monocytes Absolute: 0.4 10*3/uL (ref 0.1–1.0)
Monocytes Relative: 5.6 % (ref 3.0–12.0)
Neutro Abs: 5.7 10*3/uL (ref 1.4–7.7)
Neutrophils Relative %: 72.8 % (ref 43.0–77.0)
Platelets: 294 10*3/uL (ref 150.0–400.0)
RBC: 4.53 Mil/uL (ref 3.87–5.11)
RDW: 14.7 % (ref 11.5–15.5)
WBC: 7.8 10*3/uL (ref 4.0–10.5)

## 2022-01-02 LAB — LIPID PANEL
Cholesterol: 133 mg/dL (ref 0–200)
HDL: 44.6 mg/dL (ref >39.00)
LDL Cholesterol: 73 mg/dL (ref 0–99)
NonHDL: 87.96
Total CHOL/HDL Ratio: 3
Triglycerides: 74 mg/dL (ref 0.0–149.0)
VLDL: 14.8 mg/dL (ref 0.0–40.0)

## 2022-01-02 LAB — VITAMIN D 25 HYDROXY (VIT D DEFICIENCY, FRACTURES): VITD: 64.23 ng/mL (ref 30.00–100.00)

## 2022-01-02 LAB — COMPREHENSIVE METABOLIC PANEL
ALT: 12 U/L (ref 0–35)
AST: 14 U/L (ref 0–37)
Albumin: 4.1 g/dL (ref 3.5–5.2)
Alkaline Phosphatase: 121 U/L — ABNORMAL HIGH (ref 39–117)
BUN: 11 mg/dL (ref 6–23)
CO2: 27 mEq/L (ref 19–32)
Calcium: 9.6 mg/dL (ref 8.4–10.5)
Chloride: 104 mEq/L (ref 96–112)
Creatinine, Ser: 1.38 mg/dL — ABNORMAL HIGH (ref 0.40–1.20)
GFR: 42.75 mL/min — ABNORMAL LOW (ref >60.00)
Glucose, Bld: 76 mg/dL (ref 70–99)
Potassium: 3.9 mEq/L (ref 3.5–5.1)
Sodium: 139 mEq/L (ref 135–145)
Total Bilirubin: 0.8 mg/dL (ref 0.2–1.2)
Total Protein: 6.8 g/dL (ref 6.0–8.3)

## 2022-01-02 LAB — MICROALBUMIN / CREATININE URINE RATIO
Creatinine,U: 479.6 mg/dL
Microalb Creat Ratio: 0.4 mg/g (ref 0.0–30.0)
Microalb, Ur: 1.8 mg/dL (ref 0.0–1.9)

## 2022-01-02 LAB — HEMOGLOBIN A1C: Hgb A1c MFr Bld: 5.5 % (ref 4.6–6.5)

## 2022-01-02 LAB — TSH: TSH: 1.28 u[IU]/mL (ref 0.35–5.50)

## 2022-01-02 NOTE — Assessment & Plan Note (Signed)
Encourage heart healthy diet such as MIND or DASH diet, increase exercise, avoid trans fats, simple carbohydrates and processed foods, consider a krill or fish or flaxseed oil cap daily.  °

## 2022-01-02 NOTE — Assessment & Plan Note (Signed)
ghm utd Check labs  See avs  

## 2022-01-02 NOTE — Assessment & Plan Note (Signed)
Well controlled, no changes to meds. Encouraged heart healthy diet such as the DASH diet and exercise as tolerated.  °

## 2022-01-02 NOTE — Assessment & Plan Note (Signed)
con't wegovy Check labs

## 2022-01-02 NOTE — Patient Instructions (Signed)
Preventive Care 40-56 Years Old, Female Preventive care refers to lifestyle choices and visits with your health care provider that can promote health and wellness. Preventive care visits are also called wellness exams. What can I expect for my preventive care visit? Counseling Your health care provider may ask you questions about your: Medical history, including: Past medical problems. Family medical history. Pregnancy history. Current health, including: Menstrual cycle. Method of birth control. Emotional well-being. Home life and relationship well-being. Sexual activity and sexual health. Lifestyle, including: Alcohol, nicotine or tobacco, and drug use. Access to firearms. Diet, exercise, and sleep habits. Work and work environment. Sunscreen use. Safety issues such as seatbelt and bike helmet use. Physical exam Your health care provider will check your: Height and weight. These may be used to calculate your BMI (body mass index). BMI is a measurement that tells if you are at a healthy weight. Waist circumference. This measures the distance around your waistline. This measurement also tells if you are at a healthy weight and may help predict your risk of certain diseases, such as type 2 diabetes and high blood pressure. Heart rate and blood pressure. Body temperature. Skin for abnormal spots. What immunizations do I need?  Vaccines are usually given at various ages, according to a schedule. Your health care provider will recommend vaccines for you based on your age, medical history, and lifestyle or other factors, such as travel or where you work. What tests do I need? Screening Your health care provider may recommend screening tests for certain conditions. This may include: Lipid and cholesterol levels. Diabetes screening. This is done by checking your blood sugar (glucose) after you have not eaten for a while (fasting). Pelvic exam and Pap test. Hepatitis B test. Hepatitis C  test. HIV (human immunodeficiency virus) test. STI (sexually transmitted infection) testing, if you are at risk. Lung cancer screening. Colorectal cancer screening. Mammogram. Talk with your health care provider about when you should start having regular mammograms. This may depend on whether you have a family history of breast cancer. BRCA-related cancer screening. This may be done if you have a family history of breast, ovarian, tubal, or peritoneal cancers. Bone density scan. This is done to screen for osteoporosis. Talk with your health care provider about your test results, treatment options, and if necessary, the need for more tests. Follow these instructions at home: Eating and drinking  Eat a diet that includes fresh fruits and vegetables, whole grains, lean protein, and low-fat dairy products. Take vitamin and mineral supplements as recommended by your health care provider. Do not drink alcohol if: Your health care provider tells you not to drink. You are pregnant, may be pregnant, or are planning to become pregnant. If you drink alcohol: Limit how much you have to 0-1 drink a day. Know how much alcohol is in your drink. In the [place] one drink equals one 12 oz bottle of beer (355 mL), one 5 oz glass of wine (148 mL), or one 1 oz glass of hard liquor (44 mL). Lifestyle Brush your teeth every morning and night with fluoride toothpaste. Floss one time each day. Exercise for at least 30 minutes 5 or more days each week. Do not use any products that contain nicotine or tobacco. These products include cigarettes, chewing tobacco, and vaping devices, such as e-cigarettes. If you need help quitting, ask your health care provider. Do not use drugs. If you are sexually active, practice safe sex. Use a condom or other form of protection to   prevent STIs. If you do not wish to become pregnant, use a form of birth control. If you plan to become pregnant, see your health care provider for a  prepregnancy visit. Take aspirin only as told by your health care provider. Make sure that you understand how much to take and what form to take. Work with your health care provider to find out whether it is safe and beneficial for you to take aspirin daily. Find healthy ways to manage stress, such as: Meditation, yoga, or listening to music. Journaling. Talking to a trusted person. Spending time with friends and family. Minimize exposure to UV radiation to reduce your risk of skin cancer. Safety Always wear your seat belt while driving or riding in a vehicle. Do not drive: If you have been drinking alcohol. Do not ride with someone who has been drinking. When you are tired or distracted. While texting. If you have been using any mind-altering substances or drugs. Wear a helmet and other protective equipment during sports activities. If you have firearms in your house, make sure you follow all gun safety procedures. Seek help if you have been physically or sexually abused. What's next? Visit your health care provider once a year for an annual wellness visit. Ask your health care provider how often you should have your eyes and teeth checked. Stay up to date on all vaccines. This information is not intended to replace advice given to you by your health care provider. Make sure you discuss any questions you have with your health care provider. Document Revised: 08/22/2020 Document Reviewed: 08/22/2020 Elsevier Patient Education  [place].

## 2022-01-02 NOTE — Progress Notes (Signed)
Subjective:     Cheryl Owens is a 56 y.o. female and is here for a comprehensive physical exam. The patient reports no problems. She needs no refills --- f/u on bp  chol and weight   Social History   Socioeconomic History   Marital status: Single    Spouse name: Not on file   Number of children: 1   Years of education: Not on file   Highest education level: Not on file  Occupational History   Occupation: Cabin crew  Tobacco Use   Smoking status: Never   Smokeless tobacco: Never  Vaping Use   Vaping Use: Never used  Substance and Sexual Activity   Alcohol use: No   Drug use: No   Sexual activity: Not Currently    Birth control/protection: Surgical    Comment: 1st intercourse- 58, partners- 84, hysterectomy  Other Topics Concern   Not on file  Social History Narrative   No exercise    Social Determinants of Health   Financial Resource Strain: Not on file  Food Insecurity: Not on file  Transportation Needs: Not on file  Physical Activity: Not on file  Stress: Not on file  Social Connections: Not on file  Intimate Partner Violence: Not on file   Health Maintenance  Topic Date Due   COVID-19 Vaccine ([place] series) 09/28/2020   PAP SMEAR-Modifier  08/19/2021   MAMMOGRAM  12/26/2021   HIV Screening  03/14/2027 (Originally 05/11/1980)   COLONOSCOPY (Pts 45-74yr Insurance coverage will need to be confirmed)  04/28/2028   TETANUS/TDAP  11/16/2030   INFLUENZA VACCINE  Completed   Hepatitis C Screening  Completed   Zoster Vaccines- Shingrix  Completed   HPV VACCINES  Aged Out    The following portions of the patient's history were reviewed and updated as appropriate: She  has a past medical history of Anxiety, Cellulitis, Depression, Dry mouth, Fibroids, Gallbladder disease, High cholesterol, HTN (hypertension) (2007), Hypertension, Hypokalemia (2007), Nervousness, Obesity, Pre-diabetes, Stress, Swelling of lower extremity, and Trouble in sleeping. She does not  have any pertinent problems on file. She  has a past surgical history that includes arm surgery; Cholecystectomy; Wisdom tooth extraction; Breast surgery (Right, 2016); Ganglion cyst excision (N/A); and Robotic assisted total hysterectomy with bilateral salpingo oophorectomy (Bilateral, 11/28/2019). Her family history includes Anxiety disorder in her mother; Bipolar disorder in her paternal aunt; Breast cancer (age of onset: 569 in her maternal aunt; Cancer in her father, maternal aunt, and maternal grandmother; Depression in her mother; Hyperlipidemia in her mother; Hypertension in her maternal aunt, maternal grandmother, mother, and paternal grandmother; Kidney disease in her maternal aunt; Obesity in her mother; Prostate cancer in her father and paternal uncle. She  reports that she has never smoked. She has never used smokeless tobacco. She reports that she does not drink alcohol and does not use drugs. She has a current medication list which includes the following prescription(s): atorvastatin, clonazepam, escitalopram, labetalol, metformin, methocarbamol, omeprazole, wegovy, spironolactone, and vitamin d (ergocalciferol). Current Outpatient Medications on File Prior to Visit  Medication Sig Dispense Refill   atorvastatin (LIPITOR) 20 MG tablet Take 1 tablet (20 mg total) by mouth daily. 90 tablet 3   clonazePAM (KLONOPIN) 0.5 MG tablet TAKE 1/2 TABLET BY MOUTH IN THE MORNING AND 1 TABLET AT BEDTIME 135 tablet 0   escitalopram (LEXAPRO) 10 MG tablet Take 1 tablet by mouth daily. 90 tablet 0   labetalol (NORMODYNE) 100 MG tablet Take 1 tablet (100 mg total)  by mouth 2 (two) times daily. 180 tablet 3   metFORMIN (GLUCOPHAGE-XR) 500 MG 24 hr tablet Take 1 tablet (500 mg total) by mouth daily with breakfast. 90 tablet 1   methocarbamol (ROBAXIN) 500 MG tablet Take 1 tablet (500 mg total) by mouth every 6 (six) hours as needed for muscle spasms. 60 tablet 4   omeprazole (PRILOSEC) 20 MG capsule Take 1  capsule (20 mg total) by mouth daily. 30 capsule 3   Semaglutide-Weight Management (WEGOVY) 2.4 MG/0.75ML SOAJ Inject 2.4 mg into the skin once a week. 3 mL 3   spironolactone (ALDACTONE) 25 MG tablet Take 1 tablet (25 mg total) by mouth 2 (two) times daily. 180 tablet 3   Vitamin D, Ergocalciferol, (DRISDOL) 1.25 MG (50000 UNIT) CAPS capsule TAKE ONE CAPSULE BY MOUTH EVERY 7 DAYS 12 capsule 0   No current facility-administered medications on file prior to visit.   She is allergic to hydrochlorothiazide and benazepril..  Review of Systems Review of Systems  Constitutional: Negative for activity change, appetite change and fatigue.  HENT: Negative for hearing loss, congestion, tinnitus and ear discharge.  dentist q4mEyes: Negative for visual disturbance (see optho q1y -- vision corrected to 20/20 with glasses).  Respiratory: Negative for cough, chest tightness and shortness of breath.   Cardiovascular: Negative for chest pain, palpitations and leg swelling.  Gastrointestinal: Negative for abdominal pain, diarrhea, constipation and abdominal distention.  Genitourinary: Negative for urgency, frequency, decreased urine volume and difficulty urinating.  Musculoskeletal: Negative for back pain, arthralgias and gait problem.  Skin: Negative for color change, pallor and rash.  Neurological: Negative for dizziness, light-headedness, numbness and headaches.  Hematological: Negative for adenopathy. Does not bruise/bleed easily.  Psychiatric/Behavioral: Negative for suicidal ideas, confusion, sleep disturbance, self-injury, dysphoric mood, decreased concentration and agitation.      Objective:    BP 110/70 (BP Location: Left Arm, Patient Position: Sitting, Cuff Size: Large)   Pulse 76   Temp 98.9 F (37.2 C) (Oral)   Resp 18   Ht '5\' 7"'$  (1.702 m)   Wt 230 lb 12.8 oz (104.7 kg)   LMP 12/01/2018 (Approximate)   SpO2 95%   BMI 36.15 kg/m  General appearance: alert, cooperative, appears  stated age, no distress, and morbidly obese Head: Normocephalic, without obvious abnormality, atraumatic Eyes: conjunctivae/corneas clear. PERRL, EOM's intact. Fundi benign. Ears: normal TM's and external ear canals both ears Nose: Nares normal. Septum midline. Mucosa normal. No drainage or sinus tenderness. Throat: lips, mucosa, and tongue normal; teeth and gums normal Neck: no adenopathy, no carotid bruit, no JVD, supple, symmetrical, trachea midline, and thyroid not enlarged, symmetric, no tenderness/mass/nodules Back: symmetric, no curvature. ROM normal. No CVA tenderness. Lungs: clear to auscultation bilaterally Heart: regular rate and rhythm, S1, S2 normal, no murmur, click, rub or gallop Abdomen: soft, non-tender; bowel sounds normal; no masses,  no organomegaly Extremities: extremities normal, atraumatic, no cyanosis or edema Pulses: 2+ and symmetric Skin: Skin color, texture, turgor normal. No rashes or lesions Lymph nodes: Cervical, supraclavicular, and axillary nodes normal. Neurologic: Alert and oriented X 3, normal strength and tone. Normal symmetric reflexes. Normal coordination and gait    Assessment:    Healthy female exam.      Plan:     See After Visit Summary for Counseling Recommendations    1. [place] we[place] Pt will start to add exercise Discussed eating enough protein with veg/fruit and healty carb  - CBC with Differential/Platelet - Comprehensive metabolic panel -  Hemoglobin A1c - Lipid panel - Microalbumin / creatinine urine ratio - TSH - VITAMIN D 25 Hydroxy (Vit-D Deficiency, Fractures)  2. Vitamin D deficiency Check labs  - VITAMIN D 25 Hydroxy (Vit-D Deficiency, Fractures)  3. Hyperlipidemia LDL goal <100 Encourage heart healthy diet such as MIND or DASH diet, increase exercise, avoid trans fats, simple carbohydrates and processed foods, consider a krill or fish or flaxseed oil cap daily.   - CBC with Differential/Platelet -  Comprehensive metabolic panel - Hemoglobin A1c - Lipid panel - Microalbumin / creatinine urine ratio - TSH - VITAMIN D 25 Hydroxy (Vit-D Deficiency, Fractures)  4. Essential hypertension Well controlled, no changes to meds. Encouraged heart healthy diet such as the DASH diet and exercise as tolerated.   - CBC with Differential/Platelet - Comprehensive metabolic panel - Hemoglobin A1c - Lipid panel - Microalbumin / creatinine urine ratio - TSH - VITAMIN D 25 Hydroxy (Vit-D Deficiency, Fractures)  5. Preventative health care  Ghm utd Check labs See avs Pt had flu shot at work - CBC with Differential/Platelet - Comprehensive metabolic panel - Hemoglobin A1c - Lipid panel - Microalbumin / creatinine urine ratio - TSH - VITAMIN D 25 Hydroxy (Vit-D Deficiency, Fractures)  6. Prediabetes Check labs  - Comprehensive metabolic panel - Hemoglobin A1c - Microalbumin / creatinine urine ratio  7. Obesity (BMI 30-39.9) On wegovy  8. Stage 3a chronic kidney disease [facility]) Per nephrology

## 2022-01-02 NOTE — Assessment & Plan Note (Signed)
Per nephrology 

## 2022-01-03 ENCOUNTER — Other Ambulatory Visit: Payer: Self-pay | Admitting: Family Medicine

## 2022-01-03 ENCOUNTER — Other Ambulatory Visit (HOSPITAL_COMMUNITY): Payer: Self-pay

## 2022-01-03 MED ORDER — METFORMIN HCL ER 500 MG PO TB24
500.0000 mg | ORAL_TABLET | Freq: Every day | ORAL | 1 refills | Status: DC
  Filled 2022-01-03: qty 90, 90d supply, fill #0
  Filled 2022-05-26: qty 90, 90d supply, fill #1

## 2022-01-09 ENCOUNTER — Other Ambulatory Visit (HOSPITAL_COMMUNITY): Payer: Self-pay

## 2022-01-15 ENCOUNTER — Encounter: Payer: Self-pay | Admitting: Family Medicine

## 2022-01-16 ENCOUNTER — Other Ambulatory Visit (HOSPITAL_COMMUNITY): Payer: Self-pay

## 2022-01-16 ENCOUNTER — Other Ambulatory Visit: Payer: Self-pay | Admitting: Psychiatry

## 2022-01-16 NOTE — Telephone Encounter (Signed)
Pt called lvm to call office back

## 2022-01-19 ENCOUNTER — Other Ambulatory Visit: Payer: Self-pay | Admitting: Family Medicine

## 2022-01-19 DIAGNOSIS — I10 Essential (primary) hypertension: Secondary | ICD-10-CM

## 2022-01-20 ENCOUNTER — Other Ambulatory Visit (HOSPITAL_COMMUNITY): Payer: Self-pay

## 2022-01-20 MED ORDER — SPIRONOLACTONE 25 MG PO TABS
25.0000 mg | ORAL_TABLET | Freq: Two times a day (BID) | ORAL | 1 refills | Status: DC
  Filled 2022-01-20 – 2022-02-03 (×2): qty 180, 90d supply, fill #0
  Filled 2022-08-11: qty 180, 90d supply, fill #1

## 2022-01-20 MED ORDER — LABETALOL HCL 100 MG PO TABS
100.0000 mg | ORAL_TABLET | Freq: Two times a day (BID) | ORAL | 1 refills | Status: DC
  Filled 2022-01-20 – 2022-02-03 (×2): qty 180, 90d supply, fill #0
  Filled 2022-08-11: qty 180, 90d supply, fill #1

## 2022-01-21 NOTE — Telephone Encounter (Signed)
Called Pt again lvm to return call

## 2022-01-22 ENCOUNTER — Other Ambulatory Visit (HOSPITAL_COMMUNITY): Payer: Self-pay

## 2022-01-31 ENCOUNTER — Other Ambulatory Visit (HOSPITAL_COMMUNITY): Payer: Self-pay

## 2022-02-01 ENCOUNTER — Other Ambulatory Visit (HOSPITAL_COMMUNITY): Payer: Self-pay

## 2022-02-03 ENCOUNTER — Other Ambulatory Visit (HOSPITAL_COMMUNITY): Payer: Self-pay

## 2022-02-06 ENCOUNTER — Other Ambulatory Visit (HOSPITAL_COMMUNITY): Payer: Self-pay

## 2022-02-06 ENCOUNTER — Encounter: Payer: Self-pay | Admitting: Obstetrics & Gynecology

## 2022-02-06 ENCOUNTER — Ambulatory Visit (INDEPENDENT_AMBULATORY_CARE_PROVIDER_SITE_OTHER): Payer: No Typology Code available for payment source | Admitting: Obstetrics & Gynecology

## 2022-02-06 VITALS — BP 116/72 | HR 70 | Ht 65.5 in | Wt 227.0 lb

## 2022-02-06 DIAGNOSIS — L292 Pruritus vulvae: Secondary | ICD-10-CM

## 2022-02-06 DIAGNOSIS — Z780 Asymptomatic menopausal state: Secondary | ICD-10-CM | POA: Diagnosis not present

## 2022-02-06 DIAGNOSIS — Z01419 Encounter for gynecological examination (general) (routine) without abnormal findings: Secondary | ICD-10-CM

## 2022-02-06 DIAGNOSIS — Z90710 Acquired absence of both cervix and uterus: Secondary | ICD-10-CM

## 2022-02-06 MED ORDER — NYSTATIN-TRIAMCINOLONE 100000-0.1 UNIT/GM-% EX OINT
1.0000 | TOPICAL_OINTMENT | Freq: Every day | CUTANEOUS | 3 refills | Status: DC | PRN
  Filled 2022-02-06: qty 30, 30d supply, fill #0

## 2022-02-06 NOTE — Progress Notes (Signed)
COZETTA SEIF 13-Jan-1966 [id number]   History:    56 y.o. G4P1A3L1 Single.  Daughter is 71 yo.   RP:  Established patient presenting for annual gyn exam    HPI: S/P XI Robotic TLH/BSO 11/28/2019. Patho benign.  No abdominopelvic pain.  Abstinent.  Will repeat a Pap at 5 years 11/2024. Breasts normal.  Screening mammo neg 12/2020, scheduled for screening Mammo in 02/2022.  C/O vulvar itching.  No UTI Sx.  BMs normal.  Weight loss program with Wegovy injections since 06/2021. BMI 37.2. Colono 03/2016. Flu vaccine done with cone   Past medical history,surgical history, family history and social history were all reviewed and documented in the EPIC chart.  Gynecologic History Patient's last menstrual period was 12/01/2018 (approximate).  Obstetric History OB History  Gravida Para Term Preterm AB Living  '4 1 1   3 1  '$ SAB IAB Ectopic Multiple Live Births    3          # Outcome Date GA Lbr Len/2nd Weight Sex Delivery Anes PTL Lv  4 IAB           3 IAB           2 IAB           1 Term              ROS: A ROS was performed and pertinent positives and negatives are included in the history. GENERAL: No fevers or chills. HEENT: No change in vision, no earache, sore throat or sinus congestion. NECK: No pain or stiffness. CARDIOVASCULAR: No chest pain or pressure. No palpitations. PULMONARY: No shortness of breath, cough or wheeze. GASTROINTESTINAL: No abdominal pain, nausea, vomiting or diarrhea, melena or bright red blood per rectum. GENITOURINARY: No urinary frequency, urgency, hesitancy or dysuria. MUSCULOSKELETAL: No joint or muscle pain, no back pain, no recent trauma. DERMATOLOGIC: No rash, no itching, no lesions. ENDOCRINE: No polyuria, polydipsia, no heat or cold intolerance. No recent change in weight. HEMATOLOGICAL: No anemia or easy bruising or bleeding. NEUROLOGIC: No headache, seizures, numbness, tingling or weakness. PSYCHIATRIC: No depression, no loss of interest in normal activity or  change in sleep pattern.     Exam:   BP 116/72   Pulse 70   Ht 5' 5.5" (1.664 m)   Wt 227 lb (103 kg)   LMP 12/01/2018 (Approximate)   SpO2 98%   BMI 37.20 kg/m   Body mass index is 37.2 kg/m.  General appearance : Well developed well nourished female. No acute distress HEENT: Eyes: no retinal hemorrhage or exudates,  Neck supple, trachea midline, no carotid bruits, no thyroidmegaly Lungs: Clear to auscultation, no rhonchi or wheezes, or rib retractions  Heart: Regular rate and rhythm, no murmurs or gallops Breast:Examined in sitting and supine position were symmetrical in appearance, no palpable masses or tenderness,  no skin retraction, no nipple inversion, no nipple discharge, no skin discoloration, no axillary or supraclavicular lymphadenopathy Abdomen: no palpable masses or tenderness, no rebound or guarding Extremities: no edema or skin discoloration or tenderness  Pelvic: Vulva: Dryness at intertriginous areas bilaterally             Vagina: No gross lesions or discharge  Cervix/Uterus absent  Adnexa  Without masses or tenderness  Anus: Normal   Assessment/Plan:  56 y.o. female for annual exam   1. Well female exam with routine gynecological exam S/P XI Robotic TLH/BSO 11/28/2019. Patho benign.  No abdominopelvic pain.  Abstinent.  Will repeat  a Pap at 5 years 11/2024. Breasts normal. Screening mammo neg 12/2020, scheduled for screening Mammo in 02/2022.  C/O vulvar itching.  No UTI Sx.  BMs normal.  Weight loss program with Wegovy injections since 06/2021. BMI 37.2. Colono 03/2016. Flu vaccine done with cone   2. S/P total hysterectomy and BSO (bilateral salpingo-oophorectomy)  3. Postmenopause Well on no HRT.  4. Vulvar itching Irritation bilaterally at the intertriginous areas between the vulva and the legs.  Will treat with Mycolog.  Usage reviewed and prescription sent to pharmacy.  Other orders - nystatin-triamcinolone ointment (MYCOLOG); Apply 1 Application  topically daily as needed.   Princess Bruins MD, 3:42 PM

## 2022-02-07 ENCOUNTER — Other Ambulatory Visit (HOSPITAL_COMMUNITY): Payer: Self-pay

## 2022-02-10 ENCOUNTER — Other Ambulatory Visit (HOSPITAL_COMMUNITY): Payer: Self-pay

## 2022-02-12 ENCOUNTER — Other Ambulatory Visit (HOSPITAL_COMMUNITY): Payer: Self-pay

## 2022-02-13 ENCOUNTER — Encounter: Payer: No Typology Code available for payment source | Admitting: Family Medicine

## 2022-02-14 ENCOUNTER — Ambulatory Visit
Admission: RE | Admit: 2022-02-14 | Discharge: 2022-02-14 | Disposition: A | Discharge status: Home / Self Care | Payer: No Typology Code available for payment source | Source: Ambulatory Visit | Attending: Obstetrics & Gynecology | Admitting: Obstetrics & Gynecology

## 2022-02-14 DIAGNOSIS — Z1231 Encounter for screening mammogram for malignant neoplasm of breast: Secondary | ICD-10-CM

## 2022-02-24 ENCOUNTER — Encounter: Payer: Self-pay | Admitting: Family Medicine

## 2022-02-24 ENCOUNTER — Other Ambulatory Visit: Payer: Self-pay | Admitting: Family Medicine

## 2022-02-24 DIAGNOSIS — R1310 Dysphagia, unspecified: Secondary | ICD-10-CM

## 2022-02-24 DIAGNOSIS — E559 Vitamin D deficiency, unspecified: Secondary | ICD-10-CM

## 2022-02-25 ENCOUNTER — Other Ambulatory Visit (HOSPITAL_COMMUNITY): Payer: Self-pay

## 2022-02-25 MED ORDER — OMEPRAZOLE 20 MG PO CPDR
20.0000 mg | DELAYED_RELEASE_CAPSULE | Freq: Every day | ORAL | 1 refills | Status: DC
  Filled 2022-02-25: qty 90, 90d supply, fill #0
  Filled 2022-12-07: qty 90, 90d supply, fill #1

## 2022-02-25 MED ORDER — VITAMIN D (ERGOCALCIFEROL) 1.25 MG (50000 UNIT) PO CAPS: ORAL_CAPSULE | ORAL | 2 refills | Status: DC

## 2022-02-25 MED ORDER — WEGOVY 2.4 MG/0.75ML ~~LOC~~ SOAJ
2.4000 mg | SUBCUTANEOUS | 3 refills | Status: DC
  Filled 2022-02-25: qty 3, 28d supply, fill #0
  Filled 2022-04-01: qty 3, 28d supply, fill #1
  Filled 2022-05-02: qty 3, 28d supply, fill #2
  Filled 2022-05-26: qty 3, 28d supply, fill #3

## 2022-04-01 ENCOUNTER — Other Ambulatory Visit: Payer: Self-pay | Admitting: Psychiatry

## 2022-04-01 DIAGNOSIS — F411 Generalized anxiety disorder: Secondary | ICD-10-CM

## 2022-04-01 DIAGNOSIS — F4001 Agoraphobia with panic disorder: Secondary | ICD-10-CM

## 2022-04-03 ENCOUNTER — Ambulatory Visit: Payer: No Typology Code available for payment source | Admitting: Psychiatry

## 2022-04-17 ENCOUNTER — Ambulatory Visit: Payer: 59 | Admitting: Psychiatry

## 2022-04-17 ENCOUNTER — Encounter: Payer: Self-pay | Admitting: Psychiatry

## 2022-04-17 ENCOUNTER — Other Ambulatory Visit (HOSPITAL_COMMUNITY): Payer: Self-pay

## 2022-04-17 DIAGNOSIS — F4001 Agoraphobia with panic disorder: Secondary | ICD-10-CM

## 2022-04-17 DIAGNOSIS — F411 Generalized anxiety disorder: Secondary | ICD-10-CM | POA: Diagnosis not present

## 2022-04-17 DIAGNOSIS — F458 Other somatoform disorders: Secondary | ICD-10-CM | POA: Diagnosis not present

## 2022-04-17 DIAGNOSIS — F3342 Major depressive disorder, recurrent, in full remission: Secondary | ICD-10-CM | POA: Diagnosis not present

## 2022-04-17 DIAGNOSIS — F5105 Insomnia due to other mental disorder: Secondary | ICD-10-CM | POA: Diagnosis not present

## 2022-04-17 MED ORDER — ALPRAZOLAM 0.5 MG PO TABS
0.5000 mg | ORAL_TABLET | Freq: Four times a day (QID) | ORAL | 0 refills | Status: AC | PRN
  Filled 2022-04-17: qty 12, 3d supply, fill #0

## 2022-04-17 MED ORDER — CLONAZEPAM 0.5 MG PO TABS
ORAL_TABLET | ORAL | 1 refills | Status: DC
  Filled 2022-04-17: qty 135, fill #0
  Filled 2022-08-11: qty 135, 90d supply, fill #0

## 2022-04-17 NOTE — Progress Notes (Signed)
Cheryl Owens [id number] 1965-07-01 57 y.o.  Subjective:   Patient ID:  Cheryl Owens is a 57 y.o. (DOB Dec 19, 1965) female.  Chief Complaint:  Chief Complaint  Patient presents with   Follow-up    Depression        Associated symptoms include no decreased concentration and no suicidal ideas.  Cheryl Owens presents to the office today for follow-up of depression and anxiety and history of insomnia and Bz withdrawal.    seen in June and December 2020.  No meds were changed.  She remained on Wellbutrin XL 300 mg, Lexapro 10 mg, vitamin D, and clonazepam 0.5 mg twice daily as needed.  Usually takes clonazepam 0.5 mg 1/2 AM and 1 in PM.  02/06/2020 appointment with the following noted: Still doing well overall.  Hasn't tried to reduce.  Don't want to ever go back to feeling like she did with the episode. Laid off and another person also.  Had 33 years with the company.  Got 6 mos severance pay.  Needs another job but not desperate over it.  Made money with real estate sales.  Hysterectomy went well. Overall feels ok about things but doesn't want to stop meds.  No panic. Recognizes some social anxiety.   Better with weight control.  Doing Healthy Weight and Wellness including doctor including mental health provider and counseling to help weight loss.   Work life balance is better overall. Plan: No med changes  07/31/2020 appointment with the following noted: Got laid off after 34 years and 9 mos. Hasn't found another job yet.  Would rather change fields.  Look for low stress and low responsibility. Also worried about agism.  Has interview tomorrow with [facility] physicians.  Not stretched for money.  Doesn't want to manage anyone or have too much responsibility. Emotionally things are pretty much the same.  D doing well with job.   No concerns with meds.   Patient reports stable mood and denies depressed or irritable moods.  Patient denies any recent difficulty with anxiety.  Patient denies  difficulty with sleep initiation or maintenance. 6 hours; hard to let go of phone.  Denies appetite disturbance.  Patient reports that energy and motivation have been good.  Patient denies any difficulty with concentration.  Patient denies any suicidal ideation. No panic. Hx noncompliance bc forgets.  Is using a pill box, and is aware if she forgets it. Fearful to decrease meds. Plan no changes  04/03/2021 appointment with the following noted: Feel pretty good.  Some concerns about stopping meds. Some family stress. Disc weight loss.   No panic.   Not depressed. Pending endo. Lives alone. Not sleepy during the day. But D concerned that she might have OSA.  Loud snoring. Patient reports stable mood and denies depressed or irritable moods.  Patient denies any recent difficulty with anxiety.  Patient denies difficulty with sleep initiation or maintenance. Denies appetite disturbance.  Patient reports that energy and motivation have been good.  Patient denies any difficulty with concentration.  Patient denies any suicidal ideation.  10/10/21 appt noted: No problems off the Wellbutrin. Just on Lexapro 10 and clonzaepm 0.5 mg 1/2 in AM and 1 at night. 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  Tried PT, dry needling, and a shot.  Hurts so much wants to cry.  Started Amoxicillin for infection and Advil. Wonders if Lexapro is causing teeth clenching. Tried Flexeril 5 mg sleepy and limited benefit. Emotionally feels fine.  No dep or  anxiety.   04/17/22 appt noted: Hasn't gone a complete week without Lexapro. Bruxism is much better.  Not completely back to normal. She's going to try night guard by dentist. On Lexapro 10 mg daily and clonazepam 0.5 mg 1/2 in AM and 1 at night.   Wants to try reducing Lexapro.  Feels like prior depression might be situational. Patient reports stable mood and denies depressed or irritable moods.  Patient denies any recent difficulty with anxiety.  Patient denies difficulty  with sleep initiation or maintenance. Denies appetite disturbance.  Patient reports that energy and motivation have been good.  Patient denies any difficulty with concentration.  Patient denies any suicidal ideation. M lunch cA likely.   Past Psychiatric Medication Trials:  Wellbutrin, Lexapro 10 clonazepam,   Adderall,   Episode depression early life after D born and then again later.  Review of Systems:  Review of Systems  HENT:         Jaw tightness and pain  Respiratory:  Negative for chest tightness.   Cardiovascular:  Negative for chest pain and palpitations.  Neurological:  Negative for tremors.  Psychiatric/Behavioral:  Negative for agitation, behavioral problems, confusion, decreased concentration, dysphoric mood, self-injury, sleep disturbance and suicidal ideas. The patient is not nervous/anxious and is not hyperactive.     Medications: I have reviewed the patient's current medications.  Current Outpatient Medications  Medication Sig Dispense Refill   atorvastatin (LIPITOR) 20 MG tablet Take 1 tablet (20 mg total) by mouth daily. 90 tablet 3   escitalopram (LEXAPRO) 10 MG tablet Take 1 tablet by mouth daily. 90 tablet 0   labetalol (NORMODYNE) 100 MG tablet Take 1 tablet (100 mg total) by mouth 2 (two) times daily. 180 tablet 1   metFORMIN (GLUCOPHAGE-XR) 500 MG 24 hr tablet Take 1 tablet (500 mg total) by mouth daily with breakfast. 90 tablet 1   nystatin-triamcinolone ointment (MYCOLOG) Apply 1 Application topically daily as needed. 30 g 3   omeprazole (PRILOSEC) 20 MG capsule Take 1 capsule (20 mg total) by mouth daily. 90 capsule 1   Semaglutide-Weight Management (WEGOVY) 2.4 MG/0.75ML SOAJ Inject 2.4 mg into the skin once a week. 3 mL 3   spironolactone (ALDACTONE) 25 MG tablet Take 1 tablet (25 mg total) by mouth 2 (two) times daily. 180 tablet 1   Vitamin D, Ergocalciferol, (DRISDOL) 1.25 MG (50000 UNIT) CAPS capsule TAKE ONE CAPSULE BY MOUTH EVERY 7 DAYS 12 capsule  2   ALPRAZolam (XANAX) 0.5 MG tablet Take 1 tablet (0.5 mg total) by mouth every 6 (six) hours as needed for anxiety (flyihg phobia). 12 tablet 0   clonazePAM (KLONOPIN) 0.5 MG tablet TAKE 1/2 TABLET BY MOUTH IN THE MORNING AND 1 TABLET AT BEDTIME 135 tablet 1   methocarbamol (ROBAXIN) 500 MG tablet Take 1 tablet (500 mg total) by mouth every 6 (six) hours as needed for muscle spasms. (Patient not taking: Reported on 04/17/2022) 60 tablet 4   No current facility-administered medications for this visit.    Medication Side Effects: None  Allergies:  Allergies  Allergen Reactions   Hydrochlorothiazide     REACTION: hypokalemia   Benazepril Nausea And Vomiting    abd cramps    Past Medical History:  Diagnosis Date   Anxiety    Cellulitis    diagnosed Sept.7 2021, left upper  posterior arm,  right upper posterior arm   Depression    Dry mouth    Fibroids    Gallbladder disease  High cholesterol    HTN (hypertension) 2007   seen in ER  with headaches   Hypertension    Hypokalemia 2007   due to HCTZ   Nervousness    Obesity    Pre-diabetes    Stress    Swelling of lower extremity    Trouble in sleeping     Family History  Problem Relation Age of Onset   Cancer Maternal Grandmother        thyroid   Hypertension Maternal Grandmother    Hyperlipidemia Mother    Hypertension Mother    Depression Mother    Anxiety disorder Mother    Obesity Mother    Breast cancer Maternal Aunt 21   Kidney disease Maternal Aunt         X 2; 1 on Dialysis   Cancer Maternal Aunt        ? primary; also had HTN   Prostate cancer Paternal Uncle          3 uncles   Prostate cancer Father    Cancer Father    Hypertension Paternal Grandmother    Hypertension Maternal Aunt        X 2   Bipolar disorder Paternal Aunt    Stroke Neg Hx    Diabetes Neg Hx    Heart disease Neg Hx    Colon cancer Neg Hx     Social History   Socioeconomic History   Marital status: Single    Spouse name:  Not on file   Number of children: 1   Years of education: Not on file   Highest education level: Not on file  Occupational History   Occupation: Cabin crew  Tobacco Use   Smoking status: Never   Smokeless tobacco: Never  Vaping Use   Vaping Use: Never used  Substance and Sexual Activity   Alcohol use: No   Drug use: No   Sexual activity: Not Currently    Birth control/protection: Surgical, Abstinence    Comment: 1st intercourse- 33, partners- more than 5, hysterectomy  Other Topics Concern   Not on file  Social History Narrative   No exercise    Social Determinants of Health   Financial Resource Strain: Not on file  Food Insecurity: Not on file  Transportation Needs: Not on file  Physical Activity: Not on file  Stress: Not on file  Social Connections: Not on file  Intimate Partner Violence: Not on file    Past Medical History, Surgical history, Social history, and Family history were reviewed and updated as appropriate.   Please see review of systems for further details on the patient's review from today.   Objective:   Physical Exam:  LMP 12/01/2018 (Approximate)   Physical Exam Constitutional:      General: She is not in acute distress.    Appearance: She is well-developed.  Musculoskeletal:        General: No deformity.  Neurological:     Mental Status: She is alert and oriented to person, place, and time.     Motor: No tremor.     Coordination: Coordination normal.     Gait: Gait normal.  Psychiatric:        Attention and Perception: She is attentive. She does not perceive auditory or visual hallucinations.        Mood and Affect: Mood is not anxious or depressed. Affect is not labile or tearful.        Speech: Speech normal.  Behavior: Behavior normal.        Thought Content: Thought content normal. Thought content is not delusional. Thought content does not include homicidal or suicidal ideation. Thought content does not include suicidal plan.         Cognition and Memory: Cognition normal.        Judgment: Judgment normal.     Comments: Insight intact. No auditory or visual hallucinations. No delusions.       Lab Review:     Component Value Date/Time   NA 139 01/02/2022 0913   NA 143 12/08/2018 0820   K 3.9 01/02/2022 0913   CL 104 01/02/2022 0913   CO2 27 01/02/2022 0913   GLUCOSE 76 01/02/2022 0913   BUN 11 01/02/2022 0913   BUN 12 12/08/2018 0820   CREATININE 1.38 (H) 01/02/2022 0913   CREATININE 1.44 (H) 11/07/2019 0848   CALCIUM 9.6 01/02/2022 0913   PROT 6.8 01/02/2022 0913   PROT 6.4 12/08/2018 0820   ALBUMIN 4.1 01/02/2022 0913   ALBUMIN 4.0 12/08/2018 0820   AST 14 01/02/2022 0913   ALT 12 01/02/2022 0913   ALKPHOS 121 (H) 01/02/2022 0913   BILITOT 0.8 01/02/2022 0913   BILITOT 0.4 12/08/2018 0820   GFRNONAA 53 (L) 11/18/2019 1446   GFRAA >60 11/18/2019 1446       Component Value Date/Time   WBC 7.8 01/02/2022 0913   RBC 4.53 01/02/2022 0913   HGB 12.0 01/02/2022 0913   HGB 12.8 05/05/2018 1252   HCT 37.8 01/02/2022 0913   HCT 40.4 05/05/2018 1252   PLT 294.0 01/02/2022 0913   MCV 83.3 01/02/2022 0913   MCV 86 05/05/2018 1252   MCH 28.2 11/28/2019 1403   MCHC 31.8 01/02/2022 0913   RDW 14.7 01/02/2022 0913   RDW 13.6 05/05/2018 1252   LYMPHSABS 1.6 01/02/2022 0913   LYMPHSABS 2.1 05/05/2018 1252   MONOABS 0.4 01/02/2022 0913   EOSABS 0.1 01/02/2022 0913   EOSABS 0.1 05/05/2018 1252   BASOSABS 0.0 01/02/2022 0913   BASOSABS 0.0 05/05/2018 1252    No results found for: "POCLITH", "LITHIUM"   No results found for: "PHENYTOIN", "PHENOBARB", "VALPROATE", "CBMZ"   Corrected low vitamin D 2020  .res Assessment: Plan:    Cheryl Owens was seen today for follow-up.  Diagnoses and all orders for this visit:  Generalized anxiety disorder  Depression, major, recurrent, in complete remission ([place])  Panic disorder with agoraphobia -     clonazePAM (KLONOPIN) 0.5 MG tablet; TAKE 1/2 TABLET BY  MOUTH IN THE MORNING AND 1 TABLET AT BEDTIME  Insomnia due to mental condition -     clonazePAM (KLONOPIN) 0.5 MG tablet; TAKE 1/2 TABLET BY MOUTH IN THE MORNING AND 1 TABLET AT BEDTIME  Bruxism  Other orders -     ALPRAZolam (XANAX) 0.5 MG tablet; Take 1 tablet (0.5 mg total) by mouth every 6 (six) hours as needed for anxiety (flyihg phobia).    Greater than 50% of face to face time with patient was spent on counseling and coordination of care. .  Could also help with her weight loss which is a goal.  Has gotten much better at setting boundaries with work and not overworking.  Mood is therefore better. Discussed meds and SE.   We discussed the short-term risks associated with benzodiazepines including sedation and increased fall risk among others.  Discussed long-term side effect risk including dependence, potential withdrawal symptoms, and the potential eventual dose-related risk of dementia. She feels that she  is on the lowest effective dose.  Doing well. Wean Lexapro over a couple of weeks to see if it is causing bruxism.  Xanax for flyinng and dental visits.   FU 4-6 mos.  Lynder Parents, MD, DFAPA   Please see After Visit Summary for patient specific instructions.  Future Appointments  Date Time Provider [place]  08/01/2022  8:20 AM Roma Schanz R, DO [id number]     No orders of the defined types were placed in this encounter.     -------------------------------

## 2022-04-29 ENCOUNTER — Other Ambulatory Visit (HOSPITAL_COMMUNITY): Payer: Self-pay

## 2022-05-27 ENCOUNTER — Other Ambulatory Visit: Payer: Self-pay

## 2022-05-31 ENCOUNTER — Other Ambulatory Visit (HOSPITAL_COMMUNITY): Payer: Self-pay

## 2022-06-09 ENCOUNTER — Encounter: Payer: Self-pay | Admitting: Family Medicine

## 2022-06-10 ENCOUNTER — Other Ambulatory Visit: Payer: Self-pay | Admitting: Family Medicine

## 2022-06-12 MED ORDER — WEGOVY 2.4 MG/0.75ML ~~LOC~~ SOAJ
2.4000 mg | SUBCUTANEOUS | 3 refills | Status: DC
  Filled 2022-06-12 – 2022-06-23 (×2): qty 3, 28d supply, fill #0
  Filled 2022-07-07 – 2022-08-11 (×2): qty 3, 28d supply, fill #1

## 2022-06-13 ENCOUNTER — Other Ambulatory Visit (HOSPITAL_COMMUNITY): Payer: Self-pay

## 2022-06-23 ENCOUNTER — Other Ambulatory Visit (HOSPITAL_COMMUNITY): Payer: Self-pay

## 2022-07-07 ENCOUNTER — Other Ambulatory Visit (HOSPITAL_COMMUNITY): Payer: Self-pay

## 2022-07-31 NOTE — Progress Notes (Addendum)
Subjective:   By signing my name below, I, [name], attest that this documentation has been prepared under the direction and in the presence of Seabron Spates R, DO. 08/01/2022.   Patient ID: Cheryl Owens, female    DOB: [birth year], 57 y.o.   MRN: [id number]  Chief Complaint  Patient presents with   Hypertension   Hyperlipidemia   Diabetes   Follow-up    HPI Patient is in today for an office visit.  She affirms she is taking her metformin. She is no longer taking Wegovy as her [employer] is not covering the medication.   In the office her blood pressure is well controlled today. Since she has lost about 35 lbs, she is only taking her labetalol at night, not in the morning.  BP Readings from Last 3 Encounters:  10/07/22 107/72  08/01/22 118/80  02/06/22 116/72   Wt Readings from Last 3 Encounters:  10/07/22 218 lb (98.9 kg)  08/01/22 218 lb 6.4 oz (99.1 kg)  02/06/22 227 lb (103 kg)     Past Medical History:  Diagnosis Date   Anxiety    Cellulitis    diagnosed Sept.7 2021, left upper  posterior arm,  right upper posterior arm   Depression    Dry mouth    Fibroids    Gallbladder disease    High cholesterol    HTN (hypertension) 2007   seen in ER  with headaches   Hypertension    Hypokalemia 2007   due to HCTZ   Nervousness    Obesity    Pre-diabetes    Stress    Swelling of lower extremity    Trouble in sleeping     Past Surgical History:  Procedure Laterality Date   arm surgery     post trauma @ age 42   BREAST SURGERY Right 2016   BREAST BX    CHOLECYSTECTOMY     gall stones   GANGLION CYST EXCISION N/A    wrist   ROBOTIC ASSISTED TOTAL HYSTERECTOMY WITH BILATERAL SALPINGO OOPHERECTOMY Bilateral 11/28/2019   Procedure: XI ROBOTIC ASSISTED TOTAL LAPAROSCOPIC HYSTERECTOMY WITH BILATERAL SALPINGO OOPHORECTOMY;  Surgeon: Genia Del, MD;  Location: [facility];  Service: Gynecology;  Laterality: Bilateral;  request 2 1/2  hours OR time request 8:30am OR start in [place] [facility]   WISDOM TOOTH EXTRACTION      Family History  Problem Relation Age of Onset   Cancer Maternal Grandmother        thyroid   Hypertension Maternal Grandmother    Hyperlipidemia Mother    Hypertension Mother    Depression Mother    Anxiety disorder Mother    Obesity Mother    Breast cancer Maternal Aunt 66   Kidney disease Maternal Aunt         X 2; 1 on Dialysis   Cancer Maternal Aunt        ? primary; also had HTN   Prostate cancer Paternal Uncle          3 uncles   Prostate cancer Father    Cancer Father    Hypertension Paternal Grandmother    Hypertension Maternal Aunt        X 2   Bipolar disorder Paternal Aunt    Stroke Neg Hx    Diabetes Neg Hx    Heart disease Neg Hx    Colon cancer Neg Hx     Social History   Socioeconomic History  Marital status: Single    Spouse name: Not on file   Number of children: 1   Years of education: Not on file   Highest education level: Bachelor's degree (e.g., BA, AB, BS)  Occupational History   Occupation: Agricultural consultant  Tobacco Use   Smoking status: Never   Smokeless tobacco: Never  Vaping Use   Vaping status: Never Used  Substance and Sexual Activity   Alcohol use: No   Drug use: No   Sexual activity: Not Currently    Birth control/protection: Surgical, Abstinence    Comment: 1st intercourse- 15, partners- more than 5, hysterectomy  Other Topics Concern   Not on file  Social History Narrative   No exercise    Social Determinants of Health   Financial Resource Strain: Low Risk  (07/31/2022)   Overall Financial Resource Strain (CARDIA)    Difficulty of Paying Living Expenses: Not hard at all  Food Insecurity: No Food Insecurity (07/31/2022)   Hunger Vital Sign    Worried About Running Out of Food in the Last Year: Never true    Ran Out of Food in the Last Year: Never true  Transportation Needs: No Transportation Needs (07/31/2022)   PRAPARE -  Administrator, Civil Service (Medical): No    Lack of Transportation (Non-Medical): No  Physical Activity: Unknown (07/31/2022)   Exercise Vital Sign    Days of Exercise per Week: 0 days    Minutes of Exercise per Session: Not on file  Stress: No Stress Concern Present (07/31/2022)   [employer] of Occupational Health - Occupational Stress Questionnaire    Feeling of Stress : Not at all  Social Connections: Moderately Isolated (07/31/2022)   Social Connection and Isolation Panel [NHANES]    Frequency of Communication with Friends and Family: More than three times a week    Frequency of Social Gatherings with Friends and Family: More than three times a week    Attends Religious Services: More than 4 times per year    Active Member of [employer] or Organizations: No    Attends Engineer, structural: Not on file    Marital Status: Never married  Intimate Partner Violence: Not on file    Outpatient Medications Prior to Visit  Medication Sig Dispense Refill   ALPRAZolam (XANAX) 0.5 MG tablet Take 1 tablet (0.5 mg total) by mouth every 6 (six) hours as needed for anxiety (flying phobia). 12 tablet 0   omeprazole (PRILOSEC) 20 MG capsule Take 1 capsule (20 mg total) by mouth daily. 90 capsule 1   Vitamin D, Ergocalciferol, (DRISDOL) 1.25 MG (50000 UNIT) CAPS capsule TAKE ONE CAPSULE BY MOUTH EVERY 7 DAYS 12 capsule 2   atorvastatin (LIPITOR) 20 MG tablet Take 1 tablet (20 mg total) by mouth daily. 90 tablet 3   clonazePAM (KLONOPIN) 0.5 MG tablet TAKE 1/2 TABLET BY MOUTH IN THE MORNING AND 1 TABLET AT BEDTIME 135 tablet 1   escitalopram (LEXAPRO) 10 MG tablet Take 1 tablet by mouth daily. (Patient not taking: Reported on 10/21/2022) 90 tablet 0   labetalol (NORMODYNE) 100 MG tablet Take 1 tablet (100 mg total) by mouth 2 (two) times daily. 180 tablet 1   metFORMIN (GLUCOPHAGE-XR) 500 MG 24 hr tablet Take 1 tablet (500 mg total) by mouth daily with breakfast. 90 tablet 1    nystatin-triamcinolone ointment (MYCOLOG) Apply 1 Application topically daily as needed. 30 g 3   Semaglutide-Weight Management (WEGOVY) 2.4 MG/0.75ML SOAJ Inject 2.4 mg into  the skin once a week. 3 mL 3   spironolactone (ALDACTONE) 25 MG tablet Take 1 tablet (25 mg total) by mouth 2 (two) times daily. 180 tablet 1   methocarbamol (ROBAXIN) 500 MG tablet Take 1 tablet (500 mg total) by mouth every 6 (six) hours as needed for muscle spasms. (Patient not taking: Reported on 04/17/2022) 60 tablet 4   No facility-administered medications prior to visit.    Allergies  Allergen Reactions   Hydrochlorothiazide     REACTION: hypokalemia   Benazepril Nausea And Vomiting    abd cramps    Review of Systems  Constitutional:  Negative for chills, fever and malaise/fatigue.  HENT:  Negative for congestion and hearing loss.   Eyes:  Negative for blurred vision and discharge.  Respiratory:  Negative for cough, sputum production and shortness of breath.   Cardiovascular:  Negative for chest pain, palpitations and leg swelling.  Gastrointestinal:  Negative for abdominal pain, blood in stool, constipation, diarrhea, heartburn, nausea and vomiting.  Genitourinary:  Negative for dysuria, frequency, hematuria and urgency.  Musculoskeletal:  Negative for back pain, falls and myalgias.  Skin:  Negative for rash.  Neurological:  Negative for dizziness, sensory change, loss of consciousness, weakness and headaches.  Endo/Heme/Allergies:  Negative for environmental allergies. Does not bruise/bleed easily.  Psychiatric/Behavioral:  Negative for depression and suicidal ideas. The patient is not nervous/anxious and does not have insomnia.        Objective:    Physical Exam Vitals and nursing note reviewed.  Constitutional:      Appearance: Normal appearance. She is well-developed.  HENT:     Head: Normocephalic and atraumatic.     Right Ear: Tympanic membrane, ear canal and external ear normal.     Left  Ear: Tympanic membrane, ear canal and external ear normal.  Eyes:     Extraocular Movements: Extraocular movements intact.     Conjunctiva/sclera: Conjunctivae normal.     Pupils: Pupils are equal, round, and reactive to light.  Neck:     Thyroid: No thyromegaly.     Vascular: No carotid bruit or JVD.  Cardiovascular:     Rate and Rhythm: Normal rate and regular rhythm.     Heart sounds: Normal heart sounds. No murmur heard.    No gallop.  Pulmonary:     Effort: Pulmonary effort is normal. No respiratory distress.     Breath sounds: Normal breath sounds. No wheezing or rales.  Chest:     Chest wall: No tenderness.  Musculoskeletal:     Cervical back: Normal range of motion and neck supple.  Skin:    General: Skin is warm and dry.  Neurological:     General: No focal deficit present.     Mental Status: She is alert and oriented to person, place, and time.  Psychiatric:        Mood and Affect: Mood normal.        Behavior: Behavior normal.     BP 118/80 (BP Location: Left Arm, Patient Position: Sitting, Cuff Size: Large)   Pulse 72   Temp 98.4 F (36.9 C) (Oral)   Resp 18   Ht 5' 5.5" (1.664 m)   Wt 218 lb 6.4 oz (99.1 kg)   LMP 12/01/2018 (Approximate)   SpO2 97%   BMI 35.79 kg/m  Wt Readings from Last 3 Encounters:  10/07/22 218 lb (98.9 kg)  08/01/22 218 lb 6.4 oz (99.1 kg)  02/06/22 227 lb (103 kg)    Diabetic  Foot Exam - Simple   No data filed    Lab Results  Component Value Date   WBC 6.2 08/01/2022   HGB 11.8 (L) 08/01/2022   HCT 37.2 08/01/2022   PLT 268.0 08/01/2022   GLUCOSE 81 08/01/2022   CHOL 143 08/01/2022   TRIG 65.0 08/01/2022   HDL 44.50 08/01/2022   LDLCALC 86 08/01/2022   ALT 9 08/01/2022   AST 12 08/01/2022   NA 142 08/01/2022   K 3.7 08/01/2022   CL 107 08/01/2022   CREATININE 1.17 08/01/2022   BUN 12 08/01/2022   CO2 27 08/01/2022   TSH 1.28 01/02/2022   HGBA1C 5.1 08/01/2022   MICROALBUR 1.8 01/02/2022    Lab Results   Component Value Date   TSH 1.28 01/02/2022   Lab Results  Component Value Date   WBC 6.2 08/01/2022   HGB 11.8 (L) 08/01/2022   HCT 37.2 08/01/2022   MCV 83.3 08/01/2022   PLT 268.0 08/01/2022   Lab Results  Component Value Date   NA 142 08/01/2022   K 3.7 08/01/2022   CO2 27 08/01/2022   GLUCOSE 81 08/01/2022   BUN 12 08/01/2022   CREATININE 1.17 08/01/2022   BILITOT 0.5 08/01/2022   ALKPHOS 99 08/01/2022   AST 12 08/01/2022   ALT 9 08/01/2022   PROT 6.3 08/01/2022   ALBUMIN 3.7 08/01/2022   CALCIUM 9.3 08/01/2022   ANIONGAP 9 11/18/2019   GFR 51.90 (L) 08/01/2022   Lab Results  Component Value Date   CHOL 143 08/01/2022   Lab Results  Component Value Date   HDL 44.50 08/01/2022   Lab Results  Component Value Date   LDLCALC 86 08/01/2022   Lab Results  Component Value Date   TRIG 65.0 08/01/2022   Lab Results  Component Value Date   CHOLHDL 3 08/01/2022   Lab Results  Component Value Date   HGBA1C 5.1 08/01/2022       Assessment & Plan:   Problem List Items Addressed This Visit       Unprioritized   Vitamin D deficiency - Primary   Relevant Orders   VITAMIN D 25 Hydroxy (Vit-D Deficiency, Fractures) (Completed)   Prediabetes   Relevant Orders   Insulin, random (Completed)   Stage 3a chronic kidney disease (HCC)   Relevant Orders   Comprehensive metabolic panel (Completed)   Obesity (BMI 30-39.9)   Relevant Orders   Lipid panel (Completed)   CBC with Differential/Platelet (Completed)   Comprehensive metabolic panel (Completed)   Hemoglobin A1c (Completed)   Hyperlipidemia LDL goal <100    Tolerating statin, encouraged heart healthy diet, avoid trans fats, minimize simple carbs and saturated fats. Increase exercise as tolerated       Relevant Orders   Lipid panel (Completed)   CBC with Differential/Platelet (Completed)   Comprehensive metabolic panel (Completed)   Essential hypertension    Well controlled, no changes to meds.  Encouraged heart healthy diet such as the DASH diet and exercise as tolerated.        Relevant Orders   Lipid panel (Completed)   CBC with Differential/Platelet (Completed)   Comprehensive metabolic panel (Completed)   Hemoglobin A1c (Completed)     No orders of the defined types were placed in this encounter.   IDonato Schultz, DO, personally preformed the services described in this documentation.  All medical record entries made by the scribe were at my direction and in my presence.  I have reviewed the  chart and discharge instructions (if applicable) and agree that the record reflects my personal performance and is accurate and complete. 08/01/2022.  I,Mathew Stumpf,acting as a Neurosurgeon for [employer], DO.,have documented all relevant documentation on the behalf of Donato Schultz, DO,as directed by  Donato Schultz, DO while in the presence of Donato Schultz, DO.   Donato Schultz, DO

## 2022-08-01 ENCOUNTER — Ambulatory Visit (INDEPENDENT_AMBULATORY_CARE_PROVIDER_SITE_OTHER): Payer: 59 | Admitting: Family Medicine

## 2022-08-01 ENCOUNTER — Encounter: Payer: Self-pay | Admitting: Family Medicine

## 2022-08-01 VITALS — BP 118/80 | HR 72 | Temp 98.4°F | Resp 18 | Ht 65.5 in | Wt 218.4 lb

## 2022-08-01 DIAGNOSIS — E559 Vitamin D deficiency, unspecified: Secondary | ICD-10-CM | POA: Diagnosis not present

## 2022-08-01 DIAGNOSIS — Z6835 Body mass index (BMI) 35.0-35.9, adult: Secondary | ICD-10-CM | POA: Diagnosis not present

## 2022-08-01 DIAGNOSIS — E785 Hyperlipidemia, unspecified: Secondary | ICD-10-CM | POA: Diagnosis not present

## 2022-08-01 DIAGNOSIS — I10 Essential (primary) hypertension: Secondary | ICD-10-CM | POA: Diagnosis not present

## 2022-08-01 DIAGNOSIS — N1831 Chronic kidney disease, stage 3a: Secondary | ICD-10-CM

## 2022-08-01 DIAGNOSIS — E669 Obesity, unspecified: Secondary | ICD-10-CM

## 2022-08-01 DIAGNOSIS — R7303 Prediabetes: Secondary | ICD-10-CM | POA: Diagnosis not present

## 2022-08-01 LAB — COMPREHENSIVE METABOLIC PANEL
ALT: 9 U/L (ref 0–35)
AST: 12 U/L (ref 0–37)
Albumin: 3.7 g/dL (ref 3.5–5.2)
Alkaline Phosphatase: 99 U/L (ref 39–117)
BUN: 12 mg/dL (ref 6–23)
CO2: 27 mEq/L (ref 19–32)
Calcium: 9.3 mg/dL (ref 8.4–10.5)
Chloride: 107 mEq/L (ref 96–112)
Creatinine, Ser: 1.17 mg/dL (ref 0.40–1.20)
GFR: 51.9 mL/min — ABNORMAL LOW (ref >60.00)
Glucose, Bld: 81 mg/dL (ref 70–99)
Potassium: 3.7 mEq/L (ref 3.5–5.1)
Sodium: 142 mEq/L (ref 135–145)
Total Bilirubin: 0.5 mg/dL (ref 0.2–1.2)
Total Protein: 6.3 g/dL (ref 6.0–8.3)

## 2022-08-01 LAB — CBC WITH DIFFERENTIAL/PLATELET
Basophils Absolute: 0 10*3/uL (ref 0.0–0.1)
Basophils Relative: 0.2 % (ref 0.0–3.0)
Eosinophils Absolute: 0.3 10*3/uL (ref 0.0–0.7)
Eosinophils Relative: 5.4 % — ABNORMAL HIGH (ref 0.0–5.0)
HCT: 37.2 % (ref 36.0–46.0)
Hemoglobin: 11.8 g/dL — ABNORMAL LOW (ref 12.0–15.0)
Lymphocytes Relative: 26 % (ref 12.0–46.0)
Lymphs Abs: 1.6 10*3/uL (ref 0.7–4.0)
MCHC: 31.7 g/dL (ref 30.0–36.0)
MCV: 83.3 fl (ref 78.0–100.0)
Monocytes Absolute: 0.5 10*3/uL (ref 0.1–1.0)
Monocytes Relative: 7.7 % (ref 3.0–12.0)
Neutro Abs: 3.8 10*3/uL (ref 1.4–7.7)
Neutrophils Relative %: 60.7 % (ref 43.0–77.0)
Platelets: 268 10*3/uL (ref 150.0–400.0)
RBC: 4.47 Mil/uL (ref 3.87–5.11)
RDW: 14.2 % (ref 11.5–15.5)
WBC: 6.2 10*3/uL (ref 4.0–10.5)

## 2022-08-01 LAB — LIPID PANEL
Cholesterol: 143 mg/dL (ref 0–200)
HDL: 44.5 mg/dL (ref >39.00)
LDL Cholesterol: 86 mg/dL (ref 0–99)
NonHDL: 98.54
Total CHOL/HDL Ratio: 3
Triglycerides: 65 mg/dL (ref 0.0–149.0)
VLDL: 13 mg/dL (ref 0.0–40.0)

## 2022-08-01 LAB — VITAMIN D 25 HYDROXY (VIT D DEFICIENCY, FRACTURES): VITD: 57.22 ng/mL (ref 30.00–100.00)

## 2022-08-01 LAB — HEMOGLOBIN A1C: Hgb A1c MFr Bld: 5.1 % (ref 4.6–6.5)

## 2022-08-01 NOTE — Assessment & Plan Note (Signed)
Well controlled, no changes to meds. Encouraged heart healthy diet such as the DASH diet and exercise as tolerated.  °

## 2022-08-01 NOTE — Assessment & Plan Note (Signed)
Tolerating statin, encouraged heart healthy diet, avoid trans fats, minimize simple carbs and saturated fats. Increase exercise as tolerated 

## 2022-08-04 LAB — INSULIN, RANDOM: Insulin: 22.8 u[IU]/mL — ABNORMAL HIGH

## 2022-08-05 ENCOUNTER — Encounter: Payer: Self-pay | Admitting: Family Medicine

## 2022-08-06 ENCOUNTER — Other Ambulatory Visit (HOSPITAL_COMMUNITY): Payer: Self-pay

## 2022-08-06 MED ORDER — METFORMIN HCL ER 500 MG PO TB24
1000.0000 mg | ORAL_TABLET | Freq: Every day | ORAL | 1 refills | Status: DC
  Filled 2022-08-06 – 2022-09-02 (×3): qty 180, 90d supply, fill #0
  Filled 2022-12-24 – 2023-01-11 (×2): qty 180, 90d supply, fill #1

## 2022-08-11 ENCOUNTER — Other Ambulatory Visit: Payer: Self-pay | Admitting: Family Medicine

## 2022-08-12 ENCOUNTER — Other Ambulatory Visit (HOSPITAL_COMMUNITY): Payer: Self-pay

## 2022-08-12 ENCOUNTER — Other Ambulatory Visit: Payer: Self-pay

## 2022-08-12 NOTE — Telephone Encounter (Signed)
Pt was just seen on 08/01/22

## 2022-08-25 ENCOUNTER — Encounter: Payer: Self-pay | Admitting: Family Medicine

## 2022-08-28 ENCOUNTER — Other Ambulatory Visit: Payer: Self-pay | Admitting: Family Medicine

## 2022-09-02 ENCOUNTER — Other Ambulatory Visit (HOSPITAL_COMMUNITY): Payer: Self-pay

## 2022-09-18 ENCOUNTER — Other Ambulatory Visit: Payer: Self-pay | Admitting: Oncology

## 2022-09-18 DIAGNOSIS — Z006 Encounter for examination for normal comparison and control in clinical research program: Secondary | ICD-10-CM

## 2022-10-07 ENCOUNTER — Encounter (INDEPENDENT_AMBULATORY_CARE_PROVIDER_SITE_OTHER): Payer: 59 | Admitting: Physician Assistant

## 2022-10-07 ENCOUNTER — Encounter (INDEPENDENT_AMBULATORY_CARE_PROVIDER_SITE_OTHER): Payer: Self-pay

## 2022-10-07 ENCOUNTER — Encounter (INDEPENDENT_AMBULATORY_CARE_PROVIDER_SITE_OTHER): Payer: Self-pay | Admitting: Physician Assistant

## 2022-10-09 NOTE — Progress Notes (Signed)
Erroneous encounter

## 2022-10-21 ENCOUNTER — Encounter: Payer: Self-pay | Admitting: Psychiatry

## 2022-10-21 ENCOUNTER — Ambulatory Visit (INDEPENDENT_AMBULATORY_CARE_PROVIDER_SITE_OTHER): Payer: 59 | Admitting: Psychiatry

## 2022-10-21 ENCOUNTER — Other Ambulatory Visit (HOSPITAL_COMMUNITY): Payer: Self-pay

## 2022-10-21 DIAGNOSIS — F5105 Insomnia due to other mental disorder: Secondary | ICD-10-CM

## 2022-10-21 DIAGNOSIS — F411 Generalized anxiety disorder: Secondary | ICD-10-CM | POA: Diagnosis not present

## 2022-10-21 DIAGNOSIS — F3342 Major depressive disorder, recurrent, in full remission: Secondary | ICD-10-CM

## 2022-10-21 DIAGNOSIS — F458 Other somatoform disorders: Secondary | ICD-10-CM

## 2022-10-21 DIAGNOSIS — F4001 Agoraphobia with panic disorder: Secondary | ICD-10-CM

## 2022-10-21 MED ORDER — ESCITALOPRAM OXALATE 10 MG PO TABS
10.0000 mg | ORAL_TABLET | Freq: Every day | ORAL | 0 refills | Status: DC
Start: 2022-10-21 — End: 2023-01-30
  Filled 2022-10-21 – 2022-11-03 (×2): qty 90, 90d supply, fill #0

## 2022-10-21 MED ORDER — CLONAZEPAM 0.5 MG PO TABS
ORAL_TABLET | ORAL | 1 refills | Status: DC
Start: 2022-10-21 — End: 2023-05-25
  Filled 2022-10-21: qty 135, fill #0
  Filled 2023-02-11: qty 135, 90d supply, fill #0

## 2022-10-21 NOTE — Progress Notes (Signed)
Cheryl Owens [id number] Jun 01, 1965 57 y.o.  Subjective:   Patient ID:  Cheryl Owens is a 57 y.o. (DOB [birth year]) female.  Chief Complaint:  Chief Complaint  Patient presents with   Follow-up    Depression        Associated symptoms include no decreased concentration and no suicidal ideas.  [name] presents to the office today for follow-up of depression and anxiety and history of insomnia and Bz withdrawal.    seen in June and December 2020.  No meds were changed.  She remained on Wellbutrin XL 300 mg, Lexapro 10 mg, vitamin Cheryl Owens, and clonazepam 0.5 mg twice daily as needed.  Usually takes clonazepam 0.5 mg 1/2 AM and 1 in PM.  02/06/2020 appointment with the following noted: Still doing well overall.  Hasn't tried to reduce.  Don't want to ever go back to feeling like she did with the episode. Laid off and another person also.  Had 33 years with the company.  Got 6 mos severance pay.  Needs another job but not desperate over it.  Made money with real estate sales.  Hysterectomy went well. Overall feels ok about things but doesn't want to stop meds.  No panic. Recognizes some social anxiety.   Better with weight control.  Doing Healthy Weight and Wellness including doctor including mental health provider and counseling to help weight loss.   Work life balance is better overall. Plan: No med changes  07/31/2020 appointment with the following noted: Got laid off after 34 years and 9 mos. Hasn't found another job yet.  Would rather change fields.  Look for low stress and low responsibility. Also worried about agism.  Has interview tomorrow with [facility] physicians.  Not stretched for money.  Doesn't want to manage anyone or have too much responsibility. Emotionally things are pretty much the same.  Cheryl Owens doing well with job.   No concerns with meds.   Patient reports stable mood and denies depressed or irritable moods.  Patient denies any recent difficulty with anxiety.  Patient denies  difficulty with sleep initiation or maintenance. 6 hours; hard to let go of phone.  Denies appetite disturbance.  Patient reports that energy and motivation have been good.  Patient denies any difficulty with concentration.  Patient denies any suicidal ideation. No panic. Hx noncompliance bc forgets.  Is using a pill box, and is aware if she forgets it. Fearful to decrease meds. Plan no changes  04/03/2021 appointment with the following noted: Feel pretty good.  Some concerns about stopping meds. Some family stress. Disc weight loss.   No panic.   Not depressed. Pending endo. Lives alone. Not sleepy during the day. But Cheryl Owens concerned that she might have OSA.  Loud snoring. Patient reports stable mood and denies depressed or irritable moods.  Patient denies any recent difficulty with anxiety.  Patient denies difficulty with sleep initiation or maintenance. Denies appetite disturbance.  Patient reports that energy and motivation have been good.  Patient denies any difficulty with concentration.  Patient denies any suicidal ideation.  10/10/21 appt noted: No problems off the Wellbutrin. Just on Lexapro 10 and clonzaepm 0.5 mg 1/2 in AM and 1 at night. 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  Tried PT, dry needling, and a shot.  Hurts so much wants to cry.  Started Amoxicillin for infection and Advil. Wonders if Lexapro is causing teeth clenching. Tried Flexeril 5 mg sleepy and limited benefit. Emotionally feels fine.  No dep or  anxiety.  04/17/22 appt noted: Hasn't gone a complete week without Lexapro. Bruxism is much better.  Not completely back to normal. She's going to try night guard by dentist. On Lexapro 10 mg daily and clonazepam 0.5 mg 1/2 in AM and 1 at night.   Wants to try reducing Lexapro.  Feels like prior depression might be situational. Patient reports stable mood and denies depressed or irritable moods.  Patient denies any recent difficulty with anxiety.  Patient denies difficulty  with sleep initiation or maintenance. Denies appetite disturbance.  Patient reports that energy and motivation have been good.  Patient denies any difficulty with concentration.  Patient denies any suicidal ideation. Plan: Doing well. Wean Lexapro over a couple of weeks to see if it is causing bruxism.  10/21/22 appt noted: Bruxism is better not 100% gone.  Not unbearable like it was.  When yawn it was still there.  Totally off Lexapro for mos. A little more irritable of the Lexapro.  Not sure she wants to go back on it.  No sig anxiety except dentist and takes Xanax.    Bad gag reflex with fear choking.   No sig anxiety overall.  Stress M dep with hx psych hosp.  Had polio as a child.  Goes to church with her.  M issues with sleep and on Ambien.  57 yo.  Still on clonazepam 0.25 mg AM and 0.5 mg HS.  Past Psychiatric Medication Trials:  Wellbutrin, Lexapro 10 clonazepam,   Adderall,   Episode depression early life after Cheryl Owens born and then again later.  Review of Systems:  Review of Systems  HENT:         Jaw tightness and pain  Respiratory:  Negative for chest tightness.   Cardiovascular:  Negative for chest pain and palpitations.  Neurological:  Negative for tremors.  Psychiatric/Behavioral:  Negative for agitation, behavioral problems, confusion, decreased concentration, dysphoric mood, self-injury, sleep disturbance and suicidal ideas. The patient is not nervous/anxious and is not hyperactive.     Medications: I have reviewed the patient's current medications.  Current Outpatient Medications  Medication Sig Dispense Refill   atorvastatin (LIPITOR) 20 MG tablet Take 1 tablet (20 mg total) by mouth daily. 90 tablet 3   labetalol (NORMODYNE) 100 MG tablet Take 1 tablet (100 mg total) by mouth 2 (two) times daily. 180 tablet 1   metFORMIN (GLUCOPHAGE-XR) 500 MG 24 hr tablet Take 2 tablets (1,000 mg total) by mouth daily. 180 tablet 1   omeprazole (PRILOSEC) 20 MG capsule Take 1 capsule  (20 mg total) by mouth daily. 90 capsule 1   spironolactone (ALDACTONE) 25 MG tablet Take 1 tablet (25 mg total) by mouth 2 (two) times daily. 180 tablet 1   Vitamin Cheryl Owens, Ergocalciferol, (DRISDOL) 1.25 MG (50000 UNIT) CAPS capsule TAKE ONE CAPSULE BY MOUTH EVERY 7 DAYS 12 capsule 2   clonazePAM (KLONOPIN) 0.5 MG tablet TAKE 1/2 TABLET BY MOUTH IN THE MORNING AND 1 TABLET AT BEDTIME 135 tablet 1   escitalopram (LEXAPRO) 10 MG tablet Take 1 tablet by mouth daily. 90 tablet 0   No current facility-administered medications for this visit.    Medication Side Effects: None  Allergies:  Allergies  Allergen Reactions   Hydrochlorothiazide     REACTION: hypokalemia   Benazepril Nausea And Vomiting    abd cramps    Past Medical History:  Diagnosis Date   Anxiety    Cellulitis    diagnosed Sept.7 2021, left upper  posterior arm,  right upper posterior arm   Depression    Dry mouth    Fibroids    Gallbladder disease    High cholesterol    HTN (hypertension) 2007   seen in ER  with headaches   Hypertension    Hypokalemia 2007   due to HCTZ   Nervousness    Obesity    Pre-diabetes    Stress    Swelling of lower extremity    Trouble in sleeping     Family History  Problem Relation Age of Onset   Cancer Maternal Grandmother        thyroid   Hypertension Maternal Grandmother    Hyperlipidemia Mother    Hypertension Mother    Depression Mother    Anxiety disorder Mother    Obesity Mother    Breast cancer Maternal Aunt 80   Kidney disease Maternal Aunt         X 2; 1 on Dialysis   Cancer Maternal Aunt        ? primary; also had HTN   Prostate cancer Paternal Uncle          3 uncles   Prostate cancer Father    Cancer Father    Hypertension Paternal Grandmother    Hypertension Maternal Aunt        X 2   Bipolar disorder Paternal Aunt    Stroke Neg Hx    Diabetes Neg Hx    Heart disease Neg Hx    Colon cancer Neg Hx     Social History   Socioeconomic History    Marital status: Single    Spouse name: Not on file   Number of children: 1   Years of education: Not on file   Highest education level: Bachelor's degree (e.g., BA, AB, BS)  Occupational History   Occupation: Agricultural consultant  Tobacco Use   Smoking status: Never   Smokeless tobacco: Never  Vaping Use   Vaping status: Never Used  Substance and Sexual Activity   Alcohol use: No   Drug use: No   Sexual activity: Not Currently    Birth control/protection: Surgical, Abstinence    Comment: 1st intercourse- 15, partners- more than 5, hysterectomy  Other Topics Concern   Not on file  Social History Narrative   No exercise    Social Determinants of Health   Financial Resource Strain: Low Risk  (07/31/2022)   Overall Financial Resource Strain (CARDIA)    Difficulty of Paying Living Expenses: Not hard at all  Food Insecurity: No Food Insecurity (07/31/2022)   Hunger Vital Sign    Worried About Running Out of Food in the Last Year: Never true    Ran Out of Food in the Last Year: Never true  Transportation Needs: No Transportation Needs (07/31/2022)   PRAPARE - Administrator, Civil Service (Medical): No    Lack of Transportation (Non-Medical): No  Physical Activity: Unknown (07/31/2022)   Exercise Vital Sign    Days of Exercise per Week: 0 days    Minutes of Exercise per Session: Not on file  Stress: No Stress Concern Present (07/31/2022)   [employer] of Occupational Health - Occupational Stress Questionnaire    Feeling of Stress : Not at all  Social Connections: Moderately Isolated (07/31/2022)   Social Connection and Isolation Panel [NHANES]    Frequency of Communication with Friends and Family: More than three times a week    Frequency of Social Gatherings with Friends and Family:  More than three times a week    Attends Religious Services: More than 4 times per year    Active Member of Clubs or Organizations: No    Attends Banker Meetings: Not on file     Marital Status: Never married  Intimate Partner Violence: Not on file    Past Medical History, Surgical history, Social history, and Family history were reviewed and updated as appropriate.   Please see review of systems for further details on the patient's review from today.   Objective:   Physical Exam:  LMP 12/01/2018 (Approximate)   Physical Exam Constitutional:      General: She is not in acute distress.    Appearance: She is well-developed.  Musculoskeletal:        General: No deformity.  Neurological:     Mental Status: She is alert and oriented to person, place, and time.     Motor: No tremor.     Coordination: Coordination normal.     Gait: Gait normal.  Psychiatric:        Attention and Perception: She is attentive. She does not perceive auditory or visual hallucinations.        Mood and Affect: Mood is not anxious or depressed. Affect is not labile or tearful.        Speech: Speech normal.        Behavior: Behavior normal.        Thought Content: Thought content normal. Thought content is not delusional. Thought content does not include homicidal or suicidal ideation. Thought content does not include suicidal plan.        Cognition and Memory: Cognition normal.        Judgment: Judgment normal.     Comments: Insight intact. No auditory or visual hallucinations. No delusions.       Lab Review:     Component Value Date/Time   NA 142 08/01/2022 0851   NA 143 12/08/2018 0820   K 3.7 08/01/2022 0851   CL 107 08/01/2022 0851   CO2 27 08/01/2022 0851   GLUCOSE 81 08/01/2022 0851   BUN 12 08/01/2022 0851   BUN 12 12/08/2018 0820   CREATININE 1.17 08/01/2022 0851   CREATININE 1.44 (H) 11/07/2019 0848   CALCIUM 9.3 08/01/2022 0851   PROT 6.3 08/01/2022 0851   PROT 6.4 12/08/2018 0820   ALBUMIN 3.7 08/01/2022 0851   ALBUMIN 4.0 12/08/2018 0820   AST 12 08/01/2022 0851   ALT 9 08/01/2022 0851   ALKPHOS 99 08/01/2022 0851   BILITOT 0.5 08/01/2022 0851    BILITOT 0.4 12/08/2018 0820   GFRNONAA 53 (L) 11/18/2019 1446   GFRAA >60 11/18/2019 1446       Component Value Date/Time   WBC 6.2 08/01/2022 0851   RBC 4.47 08/01/2022 0851   HGB 11.8 (L) 08/01/2022 0851   HGB 12.8 05/05/2018 1252   HCT 37.2 08/01/2022 0851   HCT 40.4 05/05/2018 1252   PLT 268.0 08/01/2022 0851   MCV 83.3 08/01/2022 0851   MCV 86 05/05/2018 1252   MCH 28.2 11/28/2019 1403   MCHC 31.7 08/01/2022 0851   RDW 14.2 08/01/2022 0851   RDW 13.6 05/05/2018 1252   LYMPHSABS 1.6 08/01/2022 0851   LYMPHSABS 2.1 05/05/2018 1252   MONOABS 0.5 08/01/2022 0851   EOSABS 0.3 08/01/2022 0851   EOSABS 0.1 05/05/2018 1252   BASOSABS 0.0 08/01/2022 0851   BASOSABS 0.0 05/05/2018 1252    No results found for: "POCLITH", "LITHIUM"  No results found for: "PHENYTOIN", "PHENOBARB", "VALPROATE", "CBMZ"   Corrected low vitamin Cheryl Owens 2020  .res Assessment: Plan:    Briel "Elon Jester" was seen today for follow-up.  Diagnoses and all orders for this visit:  Generalized anxiety disorder -     escitalopram (LEXAPRO) 10 MG tablet; Take 1 tablet by mouth daily.  Bruxism  Panic disorder with agoraphobia -     clonazePAM (KLONOPIN) 0.5 MG tablet; TAKE 1/2 TABLET BY MOUTH IN THE MORNING AND 1 TABLET AT BEDTIME -     escitalopram (LEXAPRO) 10 MG tablet; Take 1 tablet by mouth daily.  Depression, major, recurrent, in complete remission (HCC)  Insomnia due to mental condition -     clonazePAM (KLONOPIN) 0.5 MG tablet; TAKE 1/2 TABLET BY MOUTH IN THE MORNING AND 1 TABLET AT BEDTIME     face to face time with patient was spent on counseling and coordination of care. .  Could also help with her weight loss which is a goal.  Has gotten much better at setting boundaries with work and not overworking.  Mood is therefore better. Discussed meds and SE.   We discussed the short-term risks associated with benzodiazepines including sedation and increased fall risk among others.  Discussed  long-term side effect risk including dependence, potential withdrawal symptoms, and the potential eventual dose-related risk of dementia.  But recent studies from 2020 dispute this association between benzodiazepines and dementia risk. Newer studies in 2020 do not support an association with dementia.  Doing well off Lexapro.  But she might go back on for irritability if needed.  Wean clonazepam  by 0.25 mg every couple of week if possible.  Xanax for flyinng and dental visits.   FU 12 mos.  Meredith Staggers, MD, DFAPA   Please see After Visit Summary for patient specific instructions.  Future Appointments  Date Time Provider Department Center  10/22/2022  5:30 PM Langston Reusing, MD [facility] None     No orders of the defined types were placed in this encounter.     -------------------------------

## 2022-10-22 ENCOUNTER — Encounter (INDEPENDENT_AMBULATORY_CARE_PROVIDER_SITE_OTHER): Payer: Self-pay | Admitting: Family Medicine

## 2022-10-22 ENCOUNTER — Encounter (INDEPENDENT_AMBULATORY_CARE_PROVIDER_SITE_OTHER): Payer: 59 | Admitting: Family Medicine

## 2022-10-23 NOTE — Telephone Encounter (Signed)
Please contact pt.

## 2022-11-01 ENCOUNTER — Other Ambulatory Visit (HOSPITAL_COMMUNITY): Payer: Self-pay

## 2022-11-03 ENCOUNTER — Other Ambulatory Visit (HOSPITAL_COMMUNITY): Payer: Self-pay

## 2022-11-06 ENCOUNTER — Other Ambulatory Visit (HOSPITAL_COMMUNITY): Payer: Self-pay

## 2022-11-13 ENCOUNTER — Other Ambulatory Visit (HOSPITAL_COMMUNITY): Payer: Self-pay

## 2022-12-07 ENCOUNTER — Other Ambulatory Visit: Payer: Self-pay | Admitting: Family Medicine

## 2022-12-07 ENCOUNTER — Encounter: Payer: Self-pay | Admitting: Family Medicine

## 2022-12-07 DIAGNOSIS — I10 Essential (primary) hypertension: Secondary | ICD-10-CM

## 2022-12-07 DIAGNOSIS — E785 Hyperlipidemia, unspecified: Secondary | ICD-10-CM

## 2022-12-08 ENCOUNTER — Other Ambulatory Visit: Payer: Self-pay | Admitting: Family Medicine

## 2022-12-08 ENCOUNTER — Other Ambulatory Visit (HOSPITAL_COMMUNITY): Payer: Self-pay

## 2022-12-08 ENCOUNTER — Other Ambulatory Visit: Payer: Self-pay

## 2022-12-08 DIAGNOSIS — E785 Hyperlipidemia, unspecified: Secondary | ICD-10-CM | POA: Diagnosis not present

## 2022-12-08 DIAGNOSIS — I129 Hypertensive chronic kidney disease with stage 1 through stage 4 chronic kidney disease, or unspecified chronic kidney disease: Secondary | ICD-10-CM | POA: Diagnosis not present

## 2022-12-08 DIAGNOSIS — E669 Obesity, unspecified: Secondary | ICD-10-CM | POA: Diagnosis not present

## 2022-12-08 DIAGNOSIS — N1831 Chronic kidney disease, stage 3a: Secondary | ICD-10-CM | POA: Diagnosis not present

## 2022-12-08 LAB — LAB REPORT - SCANNED: EGFR: 50

## 2022-12-08 MED ORDER — ATORVASTATIN CALCIUM 20 MG PO TABS
20.0000 mg | ORAL_TABLET | Freq: Every day | ORAL | 1 refills | Status: DC
Start: 2022-12-08 — End: 2023-07-27
  Filled 2022-12-08: qty 90, 90d supply, fill #0
  Filled 2023-03-09: qty 90, 90d supply, fill #1

## 2022-12-08 MED ORDER — SPIRONOLACTONE 25 MG PO TABS
25.0000 mg | ORAL_TABLET | Freq: Two times a day (BID) | ORAL | 1 refills | Status: DC
Start: 2022-12-08 — End: 2023-07-27
  Filled 2022-12-08: qty 180, 90d supply, fill #0
  Filled 2023-03-09: qty 180, 90d supply, fill #1

## 2022-12-08 MED ORDER — LABETALOL HCL 100 MG PO TABS
100.0000 mg | ORAL_TABLET | Freq: Two times a day (BID) | ORAL | 1 refills | Status: DC
Start: 2022-12-08 — End: 2023-08-11
  Filled 2022-12-08: qty 180, 90d supply, fill #0
  Filled 2023-03-09: qty 180, 90d supply, fill #1

## 2022-12-09 ENCOUNTER — Other Ambulatory Visit (HOSPITAL_COMMUNITY): Payer: Self-pay

## 2022-12-15 ENCOUNTER — Encounter: Payer: Self-pay | Admitting: Family Medicine

## 2022-12-18 NOTE — Telephone Encounter (Signed)
Please advise   Also I read  that the definition of morbidly obese is bmi of 40, which I am under, based on my last visit, so can that be removed from my chart of "reasons I'm being seen today"?    I would also like to review again why I'm taking 2 metformin a day, which increased even though I had lost weight. Can I go back to 1 and see what the results are?

## 2022-12-22 ENCOUNTER — Ambulatory Visit (HOSPITAL_BASED_OUTPATIENT_CLINIC_OR_DEPARTMENT_OTHER)
Admission: RE | Admit: 2022-12-22 | Discharge: 2022-12-22 | Disposition: A | Discharge status: Home / Self Care | Payer: 59 | Source: Ambulatory Visit | Attending: Family Medicine | Admitting: Family Medicine

## 2022-12-22 DIAGNOSIS — E785 Hyperlipidemia, unspecified: Secondary | ICD-10-CM | POA: Insufficient documentation

## 2023-01-03 ENCOUNTER — Other Ambulatory Visit (HOSPITAL_COMMUNITY): Payer: Self-pay

## 2023-01-12 ENCOUNTER — Other Ambulatory Visit (HOSPITAL_COMMUNITY): Payer: Self-pay

## 2023-01-20 ENCOUNTER — Ambulatory Visit: Payer: 59 | Admitting: Family Medicine

## 2023-01-26 ENCOUNTER — Ambulatory Visit: Payer: 59 | Admitting: Family Medicine

## 2023-01-30 ENCOUNTER — Other Ambulatory Visit: Payer: Self-pay | Admitting: Psychiatry

## 2023-01-30 DIAGNOSIS — F4001 Agoraphobia with panic disorder: Secondary | ICD-10-CM

## 2023-01-30 DIAGNOSIS — F411 Generalized anxiety disorder: Secondary | ICD-10-CM

## 2023-02-01 ENCOUNTER — Other Ambulatory Visit (HOSPITAL_COMMUNITY): Payer: Self-pay

## 2023-02-01 MED ORDER — ESCITALOPRAM OXALATE 10 MG PO TABS
10.0000 mg | ORAL_TABLET | Freq: Every day | ORAL | 0 refills | Status: DC
  Filled 2023-02-01: qty 90, 90d supply, fill #0

## 2023-02-02 ENCOUNTER — Other Ambulatory Visit (HOSPITAL_COMMUNITY): Payer: Self-pay

## 2023-02-03 ENCOUNTER — Ambulatory Visit (INDEPENDENT_AMBULATORY_CARE_PROVIDER_SITE_OTHER): Payer: 59 | Admitting: Family Medicine

## 2023-02-03 ENCOUNTER — Encounter: Payer: Self-pay | Admitting: Family Medicine

## 2023-02-03 VITALS — BP 130/86 | HR 60 | Temp 97.5°F | Resp 12 | Ht 66.0 in | Wt 237.0 lb

## 2023-02-03 DIAGNOSIS — I10 Essential (primary) hypertension: Secondary | ICD-10-CM | POA: Diagnosis not present

## 2023-02-03 DIAGNOSIS — R7303 Prediabetes: Secondary | ICD-10-CM

## 2023-02-03 DIAGNOSIS — N1831 Chronic kidney disease, stage 3a: Secondary | ICD-10-CM | POA: Diagnosis not present

## 2023-02-03 DIAGNOSIS — E785 Hyperlipidemia, unspecified: Secondary | ICD-10-CM

## 2023-02-03 DIAGNOSIS — E559 Vitamin D deficiency, unspecified: Secondary | ICD-10-CM

## 2023-02-03 MED ORDER — TIRZEPATIDE-WEIGHT MANAGEMENT 2.5 MG/0.5ML ~~LOC~~ SOLN: 2.5000 mg | SUBCUTANEOUS | 1 refills | Status: DC

## 2023-02-03 NOTE — Patient Instructions (Signed)
Calorie Counting for Weight Loss Calories are units of energy. Your body needs a certain number of calories from food to keep going throughout the day. When you eat or drink more calories than your body needs, your body stores the extra calories mostly as fat. When you eat or drink fewer calories than your body needs, your body burns fat to get the energy it needs. Calorie counting means keeping track of how many calories you eat and drink each day. Calorie counting can be helpful if you need to lose weight. If you eat fewer calories than your body needs, you should lose weight. Ask your health care provider what a healthy weight is for you. For calorie counting to work, you will need to eat the right number of calories each day to lose a healthy amount of weight per week. A dietitian can help you figure out how many calories you need in a day and will suggest ways to reach your calorie goal. A healthy amount of weight to lose each week is usually 1-2 lb (0.5-0.9 kg). This usually means that your daily calorie intake should be reduced by 500-750 calories. Eating 1,200-1,500 calories a day can help most women lose weight. Eating 1,500-1,800 calories a day can help most men lose weight. What do I need to know about calorie counting? Work with your health care provider or dietitian to determine how many calories you should get each day. To meet your daily calorie goal, you will need to: Find out how many calories are in each food that you would like to eat. Try to do this before you eat. Decide how much of the food you plan to eat. Keep a food log. Do this by writing down what you ate and how many calories it had. To successfully lose weight, it is important to balance calorie counting with a healthy lifestyle that includes regular activity. Where do I find calorie information?  The number of calories in a food can be found on a Nutrition Facts label. If a food does not have a Nutrition Facts label, try  to look up the calories online or ask your dietitian for help. Remember that calories are listed per serving. If you choose to have more than one serving of a food, you will have to multiply the calories per serving by the number of servings you plan to eat. For example, the label on a package of bread might say that a serving size is 1 slice and that there are 90 calories in a serving. If you eat 1 slice, you will have eaten 90 calories. If you eat 2 slices, you will have eaten 180 calories. How do I keep a food log? After each time that you eat, record the following in your food log as soon as possible: What you ate. Be sure to include toppings, sauces, and other extras on the food. How much you ate. This can be measured in cups, ounces, or number of items. How many calories were in each food and drink. The total number of calories in the food you ate. Keep your food log near you, such as in a pocket-sized notebook or on an app or website on your mobile phone. Some programs will calculate calories for you and show you how many calories you have left to meet your daily goal. What are some portion-control tips? Know how many calories are in a serving. This will help you know how many servings you can have of a certain   food. Use a measuring cup to measure serving sizes. You could also try weighing out portions on a kitchen scale. With time, you will be able to estimate serving sizes for some foods. Take time to put servings of different foods on your favorite plates or in your favorite bowls and cups so you know what a serving looks like. Try not to eat straight from a food's packaging, such as from a bag or box. Eating straight from the package makes it hard to see how much you are eating and can lead to overeating. Put the amount you would like to eat in a cup or on a plate to make sure you are eating the right portion. Use smaller plates, glasses, and bowls for smaller portions and to prevent  overeating. Try not to multitask. For example, avoid watching TV or using your computer while eating. If it is time to eat, sit down at a table and enjoy your food. This will help you recognize when you are full. It will also help you be more mindful of what and how much you are eating. What are tips for following this plan? Reading food labels Check the calorie count compared with the serving size. The serving size may be smaller than what you are used to eating. Check the source of the calories. Try to choose foods that are high in protein, fiber, and vitamins, and low in saturated fat, trans fat, and sodium. Shopping Read nutrition labels while you shop. This will help you make healthy decisions about which foods to buy. Pay attention to nutrition labels for low-fat or fat-free foods. These foods sometimes have the same number of calories or more calories than the full-fat versions. They also often have added sugar, starch, or salt to make up for flavor that was removed with the fat. Make a grocery list of lower-calorie foods and stick to it. Cooking Try to cook your favorite foods in a healthier way. For example, try baking instead of frying. Use low-fat dairy products. Meal planning Use more fruits and vegetables. One-half of your plate should be fruits and vegetables. Include lean proteins, such as chicken, turkey, and fish. Lifestyle Each week, aim to do one of the following: 150 minutes of moderate exercise, such as walking. 75 minutes of vigorous exercise, such as running. General information Know how many calories are in the foods you eat most often. This will help you calculate calorie counts faster. Find a way of tracking calories that works for you. Get creative. Try different apps or programs if writing down calories does not work for you. What foods should I eat?  Eat nutritious foods. It is better to have a nutritious, high-calorie food, such as an avocado, than a food with  few nutrients, such as a bag of potato chips. Use your calories on foods and drinks that will fill you up and will not leave you hungry soon after eating. Examples of foods that fill you up are nuts and nut butters, vegetables, lean proteins, and high-fiber foods such as whole grains. High-fiber foods are foods with more than 5 g of fiber per serving. Pay attention to calories in drinks. Low-calorie drinks include water and unsweetened drinks. The items listed above may not be a complete list of foods and beverages you can eat. Contact a dietitian for more information. What foods should I limit? Limit foods or drinks that are not good sources of vitamins, minerals, or protein or that are high in unhealthy fats. These   include: Candy. Other sweets. Sodas, specialty coffee drinks, alcohol, and juice. The items listed above may not be a complete list of foods and beverages you should avoid. Contact a dietitian for more information. How do I count calories when eating out? Pay attention to portions. Often, portions are much larger when eating out. Try these tips to keep portions smaller: Consider sharing a meal instead of getting your own. If you get your own meal, eat only half of it. Before you start eating, ask for a container and put half of your meal into it. When available, consider ordering smaller portions from the menu instead of full portions. Pay attention to your food and drink choices. Knowing the way food is cooked and what is included with the meal can help you eat fewer calories. If calories are listed on the menu, choose the lower-calorie options. Choose dishes that include vegetables, fruits, whole grains, low-fat dairy products, and lean proteins. Choose items that are boiled, broiled, grilled, or steamed. Avoid items that are buttered, battered, fried, or served with cream sauce. Items labeled as crispy are usually fried, unless stated otherwise. Choose water, low-fat milk,  unsweetened iced tea, or other drinks without added sugar. If you want an alcoholic beverage, choose a lower-calorie option, such as a glass of wine or light beer. Ask for dressings, sauces, and syrups on the side. These are usually high in calories, so you should limit the amount you eat. If you want a salad, choose a garden salad and ask for grilled meats. Avoid extra toppings such as bacon, cheese, or fried items. Ask for the dressing on the side, or ask for olive oil and vinegar or lemon to use as dressing. Estimate how many servings of a food you are given. Knowing serving sizes will help you be aware of how much food you are eating at restaurants. Where to find more information Centers for Disease Control and Prevention: www.cdc.gov U.[place]. Department of Agriculture: myplate.gov Summary Calorie counting means keeping track of how many calories you eat and drink each day. If you eat fewer calories than your body needs, you should lose weight. A healthy amount of weight to lose per week is usually 1-2 lb (0.5-0.9 kg). This usually means reducing your daily calorie intake by 500-750 calories. The number of calories in a food can be found on a Nutrition Facts label. If a food does not have a Nutrition Facts label, try to look up the calories online or ask your dietitian for help. Use smaller plates, glasses, and bowls for smaller portions and to prevent overeating. Use your calories on foods and drinks that will fill you up and not leave you hungry shortly after a meal. This information is not intended to replace advice given to you by your health care provider. Make sure you discuss any questions you have with your health care provider. Document Revised: 04/07/2019 Document Reviewed: 04/07/2019 Elsevier Patient Education  2023 Elsevier Inc.  

## 2023-02-03 NOTE — Progress Notes (Signed)
Established Patient Office Visit  Subjective   Patient ID: Cheryl Owens, female    DOB: [birth year]  Age: 57 y.o. MRN: [id number]  Chief Complaint  Patient presents with   6 month follow up    HPI Discussed the use of AI scribe software for clinical note transcription with the patient, who gave verbal consent to proceed.  History of Present Illness   The patient, employed in a healthcare setting, presents with concerns about weight gain and management. She reports a significant weight loss of about 40 pounds while on Wegovy, a weight loss medication, which she had been taking for close to a year. However, she discontinued the medication in May due to cost issues and has since regained most of the weight. The patient expresses a desire to resume a weight loss medication and is open to trying Zepbound, another weight loss medication, as an alternative to [facility].  The patient also reports non-adherence to her morning medications due to a busy work schedule, although she consistently takes her evening medications. She has been weaning off Clonazepam, now taking only one dose at night, and has completely stopped taking Lexapro. The patient does not report any new symptoms or health concerns.  In terms of preventive care, the patient is due for a mammogram and is aware that she can schedule this without a doctor's order. She also mentions a previous hysterectomy and questions the need for continued gynecological care, given the absence of any reproductive organs. The patient does not report any new or ongoing gynecological symptoms.  The patient's blood pressure is noted to be borderline high, and she acknowledges the need for better medication adherence. She also expresses a desire to manage her weight more effectively, given the recent weight gain after discontinuing Wegovy.      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/2021   Eustachian tube dysfunction, right 09/11/2021   Dysphagia 12/26/2020   Postoperative state 11/28/2019   Cellulitis of right upper extremity 11/17/2019   Impacted cerumen of left ear 03/29/2019   Otitis externa 03/29/2019   Prediabetes 03/06/2019   Class 2 severe obesity with serious comorbidity and body mass index (BMI) of 36.0 to 36.9 in adult [facility]) 01/17/2019   Stage 3a chronic kidney disease (HCC) 12/23/2018   Vitamin D deficiency 12/23/2018   Severe major depression without psychotic features (HCC) 11/10/2017   Preventative health care 06/08/2017   Elevated serum creatinine 03/13/2017   Depression 04/19/2015   Uterine fibroid 04/13/2014   Plantar fasciitis 04/13/2014   Positive urine drug screen 09/29/2013   Nausea alone 09/13/2013   Hypotension, unspecified 09/13/2013   Anxiety state 09/13/2013   Diarrhea 09/13/2013   Obesity (BMI 30-39.9) 02/15/2013   FASTING HYPERGLYCEMIA 09/28/2008   SNORING, HX OF 10/04/2007   Hyperlipidemia LDL goal <100 05/05/2007   Essential hypertension 05/05/2007   METABOLIC SYNDROME X 04/29/2007   Past Medical History:  Diagnosis Date   Anxiety    Cellulitis    diagnosed Sept.7 2021, left upper  posterior arm,  right upper posterior arm   Depression    Dry mouth    Fibroids    Gallbladder disease    High cholesterol    HTN (hypertension) 2007   seen in ER  with headaches   Hypertension    Hypokalemia 2007   due to HCTZ   Nervousness    Obesity    Pre-diabetes  Stress    Swelling of lower extremity    Trouble in sleeping    Past Surgical History:  Procedure Laterality Date   arm surgery     post trauma @ age 77   BREAST SURGERY Right 2016   BREAST BX    CHOLECYSTECTOMY     gall stones   GANGLION CYST EXCISION N/A    wrist   ROBOTIC ASSISTED TOTAL HYSTERECTOMY WITH BILATERAL SALPINGO OOPHERECTOMY Bilateral 11/28/2019   Procedure: XI ROBOTIC ASSISTED TOTAL LAPAROSCOPIC HYSTERECTOMY WITH BILATERAL SALPINGO  OOPHORECTOMY;  Surgeon: Genia Del, MD;  Location: [facility];  Service: Gynecology;  Laterality: Bilateral;  request 2 1/2 hours OR time request 8:30am OR start in [place] [facility] block   WISDOM TOOTH EXTRACTION     Social History   Tobacco Use   Smoking status: Never   Smokeless tobacco: Never  Vaping Use   Vaping status: Never Used  Substance Use Topics   Alcohol use: No   Drug use: No   Social History   Socioeconomic History   Marital status: Single    Spouse name: Not on file   Number of children: 1   Years of education: Not on file   Highest education level: Bachelor's degree (e.g., BA, AB, BS)  Occupational History   Occupation: Agricultural consultant  Tobacco Use   Smoking status: Never   Smokeless tobacco: Never  Vaping Use   Vaping status: Never Used  Substance and Sexual Activity   Alcohol use: No   Drug use: No   Sexual activity: Not Currently    Birth control/protection: Surgical, Abstinence    Comment: 1st intercourse- 15, partners- more than 5, hysterectomy  Other Topics Concern   Not on file  Social History Narrative   No exercise    Social Determinants of Health   Financial Resource Strain: Low Risk  (07/31/2022)   Overall Financial Resource Strain (CARDIA)    Difficulty of Paying Living Expenses: Not hard at all  Food Insecurity: No Food Insecurity (07/31/2022)   Hunger Vital Sign    Worried About Running Out of Food in the Last Year: Never true    Ran Out of Food in the Last Year: Never true  Transportation Needs: No Transportation Needs (07/31/2022)   PRAPARE - Administrator, Civil Service (Medical): No    Lack of Transportation (Non-Medical): No  Physical Activity: Unknown (07/31/2022)   Exercise Vital Sign    Days of Exercise per Week: 0 days    Minutes of Exercise per Session: Not on file  Stress: No Stress Concern Present (07/31/2022)   [employer] of Occupational Health - Occupational Stress Questionnaire     Feeling of Stress : Not at all  Social Connections: Moderately Isolated (07/31/2022)   Social Connection and Isolation Panel [NHANES]    Frequency of Communication with Friends and Family: More than three times a week    Frequency of Social Gatherings with Friends and Family: More than three times a week    Attends Religious Services: More than 4 times per year    Active Member of [employer] or Organizations: No    Attends Engineer, structural: Not on file    Marital Status: Never married  Intimate Partner Violence: Not on file   Family Status  Relation Name Status   MGM  (Not Specified)   Mother  Alive   Mat Aunt  (Not Specified)   Mat Aunt  (Not Specified)   Nutritional therapist  (  Not Specified)   Father  Alive   PGM  (Not Specified)   Mat Aunt  (Not Specified)   Dennie Bible Aunt  (Not Specified)   Neg Hx  (Not Specified)  No partnership data on file   Family History  Problem Relation Age of Onset   Cancer Maternal Grandmother        thyroid   Hypertension Maternal Grandmother    Hyperlipidemia Mother    Hypertension Mother    Depression Mother    Anxiety disorder Mother    Obesity Mother    Breast cancer Maternal Aunt 47   Kidney disease Maternal Aunt         X 2; 1 on Dialysis   Cancer Maternal Aunt        ? primary; also had HTN   Prostate cancer Paternal Uncle          3 uncles   Prostate cancer Father    Cancer Father    Hypertension Paternal Grandmother    Hypertension Maternal Aunt        X 2   Bipolar disorder Paternal Aunt    Stroke Neg Hx    Diabetes Neg Hx    Heart disease Neg Hx    Colon cancer Neg Hx    Allergies  Allergen Reactions   Hydrochlorothiazide     REACTION: hypokalemia   Benazepril Nausea And Vomiting    abd cramps      Review of Systems  Constitutional:  Negative for fever and malaise/fatigue.  HENT:  Negative for congestion.   Eyes:  Negative for blurred vision.  Respiratory:  Negative for shortness of breath.   Cardiovascular:   Negative for chest pain, palpitations and leg swelling.  Gastrointestinal:  Negative for abdominal pain, blood in stool and nausea.  Genitourinary:  Negative for dysuria and frequency.  Musculoskeletal:  Negative for falls.  Skin:  Negative for rash.  Neurological:  Negative for dizziness, loss of consciousness and headaches.  Endo/Heme/Allergies:  Negative for environmental allergies.  Psychiatric/Behavioral:  Negative for depression. The patient is not nervous/anxious.       Objective:     BP 130/86 (BP Location: Left Arm, Cuff Size: Large)   Pulse 60   Temp (!) 97.5 F (36.4 C) (Oral)   Resp 12   Ht 5\' 6"  (1.676 m)   Wt 237 lb (107.5 kg)   LMP 12/01/2018 (Approximate)   SpO2 99%   BMI 38.25 kg/m  BP Readings from Last 3 Encounters:  02/03/23 130/86  10/07/22 107/72  08/01/22 118/80   Wt Readings from Last 3 Encounters:  02/03/23 237 lb (107.5 kg)  10/07/22 218 lb (98.9 kg)  08/01/22 218 lb 6.4 oz (99.1 kg)   SpO2 Readings from Last 3 Encounters:  02/03/23 99%  10/07/22 98%  08/01/22 97%      Physical Exam Vitals and nursing note reviewed.  Constitutional:      General: She is not in acute distress.    Appearance: Normal appearance. She is well-developed.  HENT:     Head: Normocephalic and atraumatic.  Eyes:     General: No scleral icterus.       Right eye: No discharge.        Left eye: No discharge.  Cardiovascular:     Rate and Rhythm: Normal rate and regular rhythm.     Heart sounds: No murmur heard. Pulmonary:     Effort: Pulmonary effort is normal. No respiratory distress.     Breath  sounds: Normal breath sounds.  Musculoskeletal:        General: Normal range of motion.     Cervical back: Normal range of motion and neck supple.     Right lower leg: No edema.     Left lower leg: No edema.  Skin:    General: Skin is warm and dry.  Neurological:     Mental Status: She is alert and oriented to person, place, and time.  Psychiatric:        Mood  and Affect: Mood normal.        Behavior: Behavior normal.        Thought Content: Thought content normal.        Judgment: Judgment normal.      No results found for any visits on 02/03/23.  Last CBC Lab Results  Component Value Date   WBC 6.2 08/01/2022   HGB 11.8 (L) 08/01/2022   HCT 37.2 08/01/2022   MCV 83.3 08/01/2022   MCH 28.2 11/28/2019   RDW 14.2 08/01/2022   PLT 268.0 08/01/2022   Last metabolic panel Lab Results  Component Value Date   GLUCOSE 81 08/01/2022   NA 142 08/01/2022   K 3.7 08/01/2022   CL 107 08/01/2022   CO2 27 08/01/2022   BUN 12 08/01/2022   CREATININE 1.17 08/01/2022   EGFR 50.0 12/08/2022   CALCIUM 9.3 08/01/2022   PROT 6.3 08/01/2022   ALBUMIN 3.7 08/01/2022   LABGLOB 2.4 12/08/2018   AGRATIO 1.7 12/08/2018   BILITOT 0.5 08/01/2022   ALKPHOS 99 08/01/2022   AST 12 08/01/2022   ALT 9 08/01/2022   ANIONGAP 9 11/18/2019   Last lipids Lab Results  Component Value Date   CHOL 143 08/01/2022   HDL 44.50 08/01/2022   LDLCALC 86 08/01/2022   TRIG 65.0 08/01/2022   CHOLHDL 3 08/01/2022   Last hemoglobin A1c Lab Results  Component Value Date   HGBA1C 5.1 08/01/2022   Last thyroid functions Lab Results  Component Value Date   TSH 1.28 01/02/2022   T3TOTAL 110 05/05/2018   Last vitamin D Lab Results  Component Value Date   VD25OH 57.22 08/01/2022   Last vitamin B12 and Folate Lab Results  Component Value Date   VITAMINB12 605 05/05/2018   FOLATE 7.2 05/05/2018      The 10-year ASCVD risk score (Arnett DK, et al., 2019) is: 5.2%    Assessment & Plan:   Problem List Items Addressed This Visit       Unprioritized   Vitamin D deficiency   Relevant Orders   VITAMIN D 25 Hydroxy (Vit-D Deficiency, Fractures)   Prediabetes   Relevant Medications   tirzepatide (ZEPBOUND) 2.5 MG/0.5ML injection vial   Other Relevant Orders   Comprehensive metabolic panel   Insulin, random   HgB A1c   Stage 3a chronic kidney  disease (HCC)   Hyperlipidemia LDL goal <100 - Primary   Relevant Orders   CBC with Differential/Platelet   Comprehensive metabolic panel   Lipid panel   Essential hypertension   Relevant Orders   TSH  Assessment and Plan    Obesity After stopping Wegovy, she regained weight and expressed interest in Zepbound (terzepatide) due to cost and availability issues with Wegovy. We discussed Zepbound's dual mechanism of action, potential for better outcomes, cost difference, and the use of syringes to reduce costs, highlighting the potential for weight loss with the 5 mg dose. We will prescribe Zepbound 2.5 mg for one month, then  increase to 5 mg. The prescription will be [facility] to [facility] for patient assistance. We will follow up to assess tolerance and effectiveness before prescribing refills.  Hypertension She presents with borderline hypertension and reports inconsistent medication adherence due to a busy schedule. We emphasized the importance of consistent medication adherence for effective blood pressure management and suggested moving the morning pill box to a visible location to encourage consistent medication adherence.  General Health Maintenance She is due for a mammogram. We discussed scheduling a screening mammogram without a doctor's order and inquired about her COVID-19 vaccination status. We discussed personal and family history considerations, including concerns about potential adverse events following vaccination. We advise scheduling a screening mammogram at the breast center or downstairs and discussed COVID-19 vaccination, including personal and family history considerations.  Follow-up We will follow up after starting Zepbound to assess tolerance and effectiveness. Additionally, we will schedule a physical exam and lab work.        Return in about 6 months (around 08/03/2023), or if symptoms worsen or fail to improve.    Donato Schultz, DO

## 2023-02-04 ENCOUNTER — Other Ambulatory Visit: Payer: Self-pay

## 2023-02-04 ENCOUNTER — Other Ambulatory Visit (HOSPITAL_COMMUNITY): Payer: Self-pay

## 2023-02-09 ENCOUNTER — Ambulatory Visit: Payer: 59 | Admitting: Family Medicine

## 2023-02-09 ENCOUNTER — Other Ambulatory Visit: Payer: Self-pay | Admitting: Family Medicine

## 2023-02-09 ENCOUNTER — Encounter: Payer: Self-pay | Admitting: Family Medicine

## 2023-02-09 DIAGNOSIS — Z1231 Encounter for screening mammogram for malignant neoplasm of breast: Secondary | ICD-10-CM

## 2023-02-09 DIAGNOSIS — R7303 Prediabetes: Secondary | ICD-10-CM

## 2023-02-10 ENCOUNTER — Other Ambulatory Visit: Payer: 59

## 2023-02-11 ENCOUNTER — Other Ambulatory Visit (INDEPENDENT_AMBULATORY_CARE_PROVIDER_SITE_OTHER): Payer: 59

## 2023-02-11 DIAGNOSIS — E559 Vitamin D deficiency, unspecified: Secondary | ICD-10-CM | POA: Diagnosis not present

## 2023-02-11 DIAGNOSIS — I10 Essential (primary) hypertension: Secondary | ICD-10-CM

## 2023-02-11 DIAGNOSIS — R7303 Prediabetes: Secondary | ICD-10-CM | POA: Diagnosis not present

## 2023-02-11 DIAGNOSIS — E785 Hyperlipidemia, unspecified: Secondary | ICD-10-CM | POA: Diagnosis not present

## 2023-02-11 LAB — COMPREHENSIVE METABOLIC PANEL
ALT: 12 U/L (ref 0–35)
AST: 14 U/L (ref 0–37)
Albumin: 3.9 g/dL (ref 3.5–5.2)
Alkaline Phosphatase: 111 U/L (ref 39–117)
BUN: 15 mg/dL (ref 6–23)
CO2: 25 meq/L (ref 19–32)
Calcium: 9.4 mg/dL (ref 8.4–10.5)
Chloride: 108 meq/L (ref 96–112)
Creatinine, Ser: 1.28 mg/dL — ABNORMAL HIGH (ref 0.40–1.20)
GFR: 46.42 mL/min — ABNORMAL LOW (ref >60.00)
Glucose, Bld: 86 mg/dL (ref 70–99)
Potassium: 3.8 meq/L (ref 3.5–5.1)
Sodium: 141 meq/L (ref 135–145)
Total Bilirubin: 0.7 mg/dL (ref 0.2–1.2)
Total Protein: 6.7 g/dL (ref 6.0–8.3)

## 2023-02-11 LAB — CBC WITH DIFFERENTIAL/PLATELET
Basophils Absolute: 0 10*3/uL (ref 0.0–0.1)
Basophils Relative: 0.7 % (ref 0.0–3.0)
Eosinophils Absolute: 0.1 10*3/uL (ref 0.0–0.7)
Eosinophils Relative: 1.5 % (ref 0.0–5.0)
HCT: 39 % (ref 36.0–46.0)
Hemoglobin: 12.6 g/dL (ref 12.0–15.0)
Lymphocytes Relative: 31.6 % (ref 12.0–46.0)
Lymphs Abs: 2.2 10*3/uL (ref 0.7–4.0)
MCHC: 32.3 g/dL (ref 30.0–36.0)
MCV: 83.9 fL (ref 78.0–100.0)
Monocytes Absolute: 0.4 10*3/uL (ref 0.1–1.0)
Monocytes Relative: 6.2 % (ref 3.0–12.0)
Neutro Abs: 4.2 10*3/uL (ref 1.4–7.7)
Neutrophils Relative %: 60 % (ref 43.0–77.0)
Platelets: 291 10*3/uL (ref 150.0–400.0)
RBC: 4.65 Mil/uL (ref 3.87–5.11)
RDW: 13.9 % (ref 11.5–15.5)
WBC: 7 10*3/uL (ref 4.0–10.5)

## 2023-02-11 LAB — LIPID PANEL
Cholesterol: 181 mg/dL (ref 0–200)
HDL: 50.4 mg/dL (ref >39.00)
LDL Cholesterol: 111 mg/dL — ABNORMAL HIGH (ref 0–99)
NonHDL: 130.64
Total CHOL/HDL Ratio: 4
Triglycerides: 99 mg/dL (ref 0.0–149.0)
VLDL: 19.8 mg/dL (ref 0.0–40.0)

## 2023-02-11 LAB — TSH: TSH: 2.57 u[IU]/mL (ref 0.35–5.50)

## 2023-02-11 LAB — HEMOGLOBIN A1C: Hgb A1c MFr Bld: 5.6 % (ref 4.6–6.5)

## 2023-02-11 LAB — VITAMIN D 25 HYDROXY (VIT D DEFICIENCY, FRACTURES): VITD: 45.24 ng/mL (ref 30.00–100.00)

## 2023-02-11 MED ORDER — TIRZEPATIDE-WEIGHT MANAGEMENT 2.5 MG/0.5ML ~~LOC~~ SOLN: 2.5000 mg | SUBCUTANEOUS | 0 refills | Status: DC

## 2023-02-11 NOTE — Addendum Note (Signed)
Addended by: Roxanne Gates on: 02/11/2023 04:19 PM   Modules accepted: Orders

## 2023-02-12 ENCOUNTER — Other Ambulatory Visit (HOSPITAL_COMMUNITY): Payer: Self-pay

## 2023-02-12 ENCOUNTER — Other Ambulatory Visit: Payer: Self-pay

## 2023-02-12 LAB — INSULIN, RANDOM: Insulin: 20.3 u[IU]/mL — ABNORMAL HIGH

## 2023-02-16 ENCOUNTER — Other Ambulatory Visit (HOSPITAL_COMMUNITY): Payer: Self-pay

## 2023-03-01 DIAGNOSIS — Z711 Person with feared health complaint in whom no diagnosis is made: Secondary | ICD-10-CM | POA: Diagnosis not present

## 2023-03-02 ENCOUNTER — Other Ambulatory Visit: Payer: Self-pay | Admitting: Family Medicine

## 2023-03-02 DIAGNOSIS — R7303 Prediabetes: Secondary | ICD-10-CM

## 2023-03-02 MED ORDER — TIRZEPATIDE-WEIGHT MANAGEMENT 2.5 MG/0.5ML ~~LOC~~ SOLN: 2.5000 mg | SUBCUTANEOUS | 0 refills | Status: DC

## 2023-03-05 ENCOUNTER — Ambulatory Visit: Payer: 59 | Admitting: Family Medicine

## 2023-03-06 ENCOUNTER — Other Ambulatory Visit: Payer: Self-pay | Admitting: Family Medicine

## 2023-03-06 DIAGNOSIS — E559 Vitamin D deficiency, unspecified: Secondary | ICD-10-CM

## 2023-03-09 ENCOUNTER — Other Ambulatory Visit (HOSPITAL_COMMUNITY): Payer: Self-pay

## 2023-03-09 ENCOUNTER — Other Ambulatory Visit: Payer: Self-pay | Admitting: Family Medicine

## 2023-03-09 DIAGNOSIS — R1310 Dysphagia, unspecified: Secondary | ICD-10-CM

## 2023-03-10 ENCOUNTER — Other Ambulatory Visit: Payer: Self-pay

## 2023-03-10 ENCOUNTER — Ambulatory Visit
Admission: RE | Admit: 2023-03-10 | Discharge: 2023-03-10 | Disposition: A | Discharge status: Home / Self Care | Payer: 59 | Source: Ambulatory Visit

## 2023-03-10 ENCOUNTER — Other Ambulatory Visit (HOSPITAL_COMMUNITY): Payer: Self-pay

## 2023-03-10 DIAGNOSIS — Z1231 Encounter for screening mammogram for malignant neoplasm of breast: Secondary | ICD-10-CM | POA: Diagnosis not present

## 2023-03-10 MED ORDER — OMEPRAZOLE 20 MG PO CPDR
20.0000 mg | DELAYED_RELEASE_CAPSULE | Freq: Every day | ORAL | 1 refills | Status: DC
  Filled 2023-03-10: qty 90, 90d supply, fill #0
  Filled 2023-08-11 (×2): qty 90, 90d supply, fill #1

## 2023-03-26 ENCOUNTER — Other Ambulatory Visit: Payer: Self-pay | Admitting: Family Medicine

## 2023-03-26 DIAGNOSIS — R7303 Prediabetes: Secondary | ICD-10-CM

## 2023-04-07 ENCOUNTER — Encounter: Payer: Self-pay | Admitting: Obstetrics

## 2023-04-07 ENCOUNTER — Other Ambulatory Visit (HOSPITAL_COMMUNITY): Payer: Self-pay

## 2023-04-07 ENCOUNTER — Ambulatory Visit (INDEPENDENT_AMBULATORY_CARE_PROVIDER_SITE_OTHER): Payer: Commercial Managed Care - PPO | Admitting: Obstetrics

## 2023-04-07 VITALS — BP 124/82 | HR 65 | Ht 65.35 in | Wt 231.0 lb

## 2023-04-07 DIAGNOSIS — E669 Obesity, unspecified: Secondary | ICD-10-CM | POA: Diagnosis not present

## 2023-04-07 DIAGNOSIS — R197 Diarrhea, unspecified: Secondary | ICD-10-CM | POA: Diagnosis not present

## 2023-04-07 DIAGNOSIS — N952 Postmenopausal atrophic vaginitis: Secondary | ICD-10-CM | POA: Insufficient documentation

## 2023-04-07 DIAGNOSIS — N393 Stress incontinence (female) (male): Secondary | ICD-10-CM | POA: Insufficient documentation

## 2023-04-07 LAB — POCT URINALYSIS DIPSTICK
Bilirubin, UA: NEGATIVE
Blood, UA: NEGATIVE
Glucose, UA: NEGATIVE
Ketones, UA: NEGATIVE
Nitrite, UA: NEGATIVE
Protein, UA: NEGATIVE
Spec Grav, UA: 1.025 (ref 1.010–1.025)
Urobilinogen, UA: 0.2 U/dL
pH, UA: 6 (ref 5.0–8.0)

## 2023-04-07 MED ORDER — ESTRADIOL 0.1 MG/GM VA CREA
0.5000 g | TOPICAL_CREAM | VAGINAL | 3 refills | Status: DC
  Filled 2023-04-07: qty 42.5, 90d supply, fill #0

## 2023-04-07 NOTE — Assessment & Plan Note (Signed)
-   discussed association with urinary incontinence - encouraged to resume exercise and diet modification  - avoid fatty foods - continue GLP-1

## 2023-04-07 NOTE — Assessment & Plan Note (Signed)
-   review possible association with fatty foods and history of cholecystectomy and metformin use - encouraged trial of fiber supplementation to optimize stool consistency - avoid straining for bowel movements - start Kegel exercises

## 2023-04-07 NOTE — Assessment & Plan Note (Addendum)
-   POCT UA + trace leuk - bladder scan 74mL - For treatment of stress urinary incontinence,  non-surgical options include expectant management, weight loss, physical therapy, as well as a pessary.  Surgical options include a midurethral sling, Burch urethropexy, and transurethral injection of a bulking agent. - encouraged to continue weight reduction

## 2023-04-07 NOTE — Assessment & Plan Note (Signed)
-   For symptomatic vaginal atrophy options include lubrication with a water-based lubricant, personal hygiene measures and barrier protection against wetness, and estrogen replacement in the form of vaginal cream, vaginal tablets, or a time-released vaginal ring.   - Rx to start vaginal estrogen

## 2023-04-07 NOTE — Progress Notes (Addendum)
New Patient Evaluation and Consultation  Referring Provider: Zola Button, Grayling Congress, * PCP: Zola Button, Grayling Congress, DO Date of Service: 04/07/2023  SUBJECTIVE Chief Complaint: New Patient (Initial Visit) Depriest BRIYONNA OMARA is a 58 y.o. female here today for urinary incontinence )  History of Present Illness: HAYLI MILLIGAN is a 58 y.o. Black or African-American female seen in consultation at the request of Dr Zola Button for evaluation of urinary incontinence.    Urinary leakage after delivery  Seen by Dr. Etta Grandchild (urology) around 30 years ago with bladder testing and noted stress urinary incontinence Reports large leakage during dental work when she gagged from dental impression procedure Wegovy changed bowel movements from daily to every 2 days, stopped due to insurance coverage. Resumed 2 months ago GLP-1. Reports minimal weight loss Pending to resume [employer] Reports decreased oral intake of food, however did not change dietary options  S/p RATLH with BSO 11/28/19 by Dr. Seymour Bars for AUB-L in 2021 at 58yo, reports vaginal itching since hysterectomy  Review of records significant for: Stage IIIa CKD, MDD, leiomyoma, metabolic syndrome   Urinary Symptoms: Leaks urine with cough/ sneeze and laughing, when she feels like choking Denies urinary urgency Leaks 4 time(s) per years.  Denies pad use or clothing changes Patient is bothered by UI symptoms.  Day time voids 1-2.  Nocturia: 0 times per night to void. Voiding dysfunction:  empties bladder well.  Patient does not use a catheter to empty bladder.  When urinating, patient feels she has no difficulties Drinks: <16ozoz water per day with medications, 16-32oz sweet tea   UTIs:  0  UTI's in the last year.   Denies history of blood in urine, kidney or bladder stones, pyelonephritis, bladder cancer, and kidney cancer No results found for the last 90 days.   Pelvic Organ Prolapse Symptoms:                  Patient Denies a feeling of a  bulge the vaginal area.   Bowel Symptom: Bowel movements: 1 time(s) per day Stool consistency: hard, reports urgency around 1 hour after meal with diarrhea. Possibly attributed to diet with fatty foods Straining: yes.  Splinting: no.  Incomplete evacuation: no.  Patient Denies accidental bowel leakage / fecal incontinence Bowel regimen:  anti-diarrheal medication when she takes long car/plane rides Last colonoscopy: Results 03/21/16, 5mm polypectomy, internal hemorrhoids HM Colonoscopy          Upcoming     Colonoscopy (Every 10 Years) Next due on 04/28/2028    04/28/2018  Colonoscopy   Only the first 1 history entries have been loaded, but more history exists.                Sexual Function Sexually active: no.  Sexual orientation: Straight Pain with sex: No  Pelvic Pain Denies pelvic pain   Past Medical History:  Past Medical History:  Diagnosis Date   Anxiety    Cellulitis    diagnosed Sept.7 2021, left upper  posterior arm,  right upper posterior arm   Depression    Dry mouth    Fibroids    Gallbladder disease    High cholesterol    HTN (hypertension) 2007   seen in ER  with headaches   Hypertension    Hypokalemia 2007   due to HCTZ   Nervousness    Obesity    Pre-diabetes    Stress    Swelling of lower extremity    Trouble in  sleeping      Past Surgical History:   Past Surgical History:  Procedure Laterality Date   arm surgery     post trauma @ age 41   BREAST SURGERY Right 2016   BREAST BX    CHOLECYSTECTOMY     gall stones   GANGLION CYST EXCISION N/A    wrist   ROBOTIC ASSISTED TOTAL HYSTERECTOMY WITH BILATERAL SALPINGO OOPHERECTOMY Bilateral 11/28/2019   Procedure: XI ROBOTIC ASSISTED TOTAL LAPAROSCOPIC HYSTERECTOMY WITH BILATERAL SALPINGO OOPHORECTOMY;  Surgeon: Genia Del, MD;  Location: [facility] [place];  Service: Gynecology;  Laterality: Bilateral;  request 2 1/2 hours OR time request 8:30am OR start in [place]  Gyn block   WISDOM TOOTH EXTRACTION       Past OB/GYN History: OB History  Gravida Para Term Preterm AB Living  4 1 1  3 1   SAB IAB Ectopic Multiple Live Births   3   1    # Outcome Date GA Lbr Len/2nd Weight Sex Type Anes PTL Lv  4 IAB           3 IAB           2 IAB           1 Term     F Vag-Spont   LIV    Vaginal deliveries: 1, largest infant 7lb4oz with episiotomy Forceps/ Vacuum deliveries: 0, Cesarean section: 0 Menopausal: Yes, at age 78, Denies vaginal bleeding since menopause Contraception: s/p menopause. Last pap smear was 10/11/19 negative prior to hysterectomy.  Any history of abnormal pap smears: no. Denies LEEP or CKC No results found for: "DIAGPAP", "HPVHIGH", "ADEQPAP"  Medications: Patient has a current medication list which includes the following prescription(s): atorvastatin, clonazepam, [START ON 04/09/2023] estradiol, labetalol, metformin, omeprazole, spironolactone, vitamin d (ergocalciferol), and zepbound.   Allergies: Patient is allergic to hydrochlorothiazide and benazepril.   Social History:  Social History   Tobacco Use   Smoking status: Never   Smokeless tobacco: Never  Vaping Use   Vaping status: Never Used  Substance Use Topics   Alcohol use: No   Drug use: No    Relationship status: single Patient lives by herself.   Patient is employed as a Scientist, physiological at a psychiatry . Regular exercise: No History of abuse: No  Family History:   Family History  Problem Relation Age of Onset   Hyperlipidemia Mother    Hypertension Mother    Depression Mother    Anxiety disorder Mother    Obesity Mother    Prostate cancer Father    Cancer Father    Cancer Maternal Grandmother        thyroid   Hypertension Maternal Grandmother    Hypertension Paternal Grandmother    Breast cancer Maternal Aunt 34   Kidney disease Maternal Aunt         X 2; 1 on Dialysis   Cancer Maternal Aunt        ? primary; also had HTN   Hypertension Maternal Aunt         X 2   Bipolar disorder Paternal Aunt    Prostate cancer Paternal Uncle          3 uncles   Stroke Neg Hx    Diabetes Neg Hx    Heart disease Neg Hx    Colon cancer Neg Hx    Uterine cancer Neg Hx    Bladder Cancer Neg Hx      Review of Systems: Review  of Systems  Constitutional:  Negative for fever, malaise/fatigue and weight loss.  Respiratory:  Negative for cough, shortness of breath and wheezing.   Cardiovascular:  Negative for chest pain, palpitations and leg swelling.  Gastrointestinal:  Positive for abdominal pain. Negative for blood in stool and constipation.  Genitourinary:  Negative for dysuria, frequency, hematuria and urgency.       Leakage  Skin:  Negative for rash.  Neurological:  Negative for dizziness, weakness and headaches.  Endo/Heme/Allergies:  Does not bruise/bleed easily.       Hot flashes  Psychiatric/Behavioral:  Negative for depression. The patient is not nervous/anxious.      OBJECTIVE Physical Exam: Vitals:   04/07/23 0822  BP: 124/82  Pulse: 65  Weight: 231 lb (104.8 kg)  Height: 5' 5.35" (1.66 m)   Physical Exam Constitutional:      General: She is not in acute distress.    Appearance: Normal appearance.  Genitourinary:     Bladder and urethral meatus normal.     No lesions in the vagina.     Right Labia: No rash, tenderness, lesions, skin changes or Bartholin's cyst.    Left Labia: No tenderness, lesions, skin changes, Bartholin's cyst or rash.    No vaginal discharge, erythema, tenderness, bleeding, ulceration or granulation tissue.     No vaginal prolapse present.    Mild vaginal atrophy present.     Right Adnexa: not tender, not full and no mass present.    Left Adnexa: not tender, not full and no mass present.    Cervix is absent.     Uterus is absent.     Urethral meatus caruncle not present.    Urethral stress urinary incontinence with cough stress test present.     No urethral prolapse, tenderness, mass, hypermobility or  discharge present.     Bladder is not tender, urgency on palpation not present and masses not present.      Pelvic Floor: Levator muscle strength is 3/5.    Levator ani not tender, obturator internus not tender, no asymmetrical contractions present and no pelvic spasms present.    Anal wink present and BC reflex present. Cardiovascular:     Rate and Rhythm: Normal rate.  Pulmonary:     Effort: Pulmonary effort is normal. No respiratory distress.  Abdominal:     General: There is no distension.     Palpations: Abdomen is soft. There is no mass.     Tenderness: There is no abdominal tenderness.     Hernia: No hernia is present.    Neurological:     Mental Status: She is alert.  Vitals reviewed. Exam conducted with a chaperone present.       POP-Q:   POP-Q  -3                                            Aa   -3                                           Ba  -4  C   1                                            Gh  3                                            Pb  6                                            tvl   -3                                            Ap  -3                                            Bp                                                 D      Post-Void Residual (PVR) by Bladder Scan: In order to evaluate bladder emptying, we discussed obtaining a postvoid residual and patient agreed to this procedure.  Procedure: The ultrasound unit was placed on the patient's abdomen in the suprapubic region after the patient had voided.    Post Void Residual - 04/07/23 0844       Post Void Residual   Post Void Residual 74 mL              Laboratory Results: Lab Results  Component Value Date   COLORU yellow 04/07/2023   CLARITYU clear 04/07/2023   GLUCOSEUR Negative 04/07/2023   BILIRUBINUR negative 04/07/2023   KETONESU negative 04/07/2023   SPECGRAV 1.025 04/07/2023   RBCUR negative 04/07/2023    PHUR 6.0 04/07/2023   PROTEINUR Negative 04/07/2023   UROBILINOGEN 0.2 04/07/2023   LEUKOCYTESUR Trace (A) 04/07/2023    Lab Results  Component Value Date   CREATININE 1.28 (H) 02/11/2023   CREATININE 1.17 08/01/2022   CREATININE 1.38 (H) 01/02/2022    Lab Results  Component Value Date   HGBA1C 5.6 02/11/2023    Lab Results  Component Value Date   HGB 12.6 02/11/2023     ASSESSMENT AND PLAN Ms. Stange is a 58 y.o. with:  1. SUI (stress urinary incontinence, female)   2. Obesity (BMI 30-39.9)   3. Vaginal atrophy   4. Diarrhea, unspecified type     SUI (stress urinary incontinence, female) Assessment & Plan: - POCT UA + trace leuk - bladder scan 74mL - For treatment of stress urinary incontinence,  non-surgical options include expectant management, weight loss, physical therapy, as well as a pessary.  Surgical options include a midurethral sling, Burch urethropexy, and transurethral injection of a bulking agent. - encouraged to continue weight reduction  Orders: -     POCT  urinalysis dipstick -     Estradiol; Place 0.5g per vagina at bedtime for two weeks.  Then insert 0.5 gm per vagina twice a week thereafter.  Dispense: 30 g; Refill: 3  Obesity (BMI 30-39.9) Assessment & Plan: - discussed association with urinary incontinence - encouraged to resume exercise and diet modification  - avoid fatty foods - continue GLP-1   Vaginal atrophy Assessment & Plan: - For symptomatic vaginal atrophy options include lubrication with a water-based lubricant, personal hygiene measures and barrier protection against wetness, and estrogen replacement in the form of vaginal cream, vaginal tablets, or a time-released vaginal ring.   - Rx to start vaginal estrogen  Orders: -     Estradiol; Place 0.5g per vagina at bedtime for two weeks.  Then insert 0.5 gm per vagina twice a week thereafter.  Dispense: 30 g; Refill: 3  Diarrhea, unspecified type Assessment & Plan: - review  possible association with fatty foods and history of cholecystectomy and metformin use - encouraged trial of fiber supplementation to optimize stool consistency - avoid straining for bowel movements - start Kegel exercises   Time spent: I spent 70 minutes dedicated to the care of this patient on the date of this encounter to include pre-visit review of records, face-to-face time with the patient discussing stress urinary incontinence, BMI 38, vaginal atrophy, and post visit documentation and ordering medication/ testing.    Loleta Chance, MD

## 2023-04-07 NOTE — Patient Instructions (Addendum)
For treatment of stress urinary incontinence, which is leakage with physical activity/movement/strainging/coughing, we discussed expectant management versus nonsurgical options versus surgery. Nonsurgical options include weight loss, physical therapy, as well as a pessary.  Surgical options include a midurethral sling, which is a synthetic mesh sling that acts like a hammock under the urethra to prevent leakage of urine, a Burch urethropexy, and transurethral injection of a bulking agent.   For symptomatic vaginal atrophy options include lubrication with a water-based lubricant, personal hygiene measures and barrier protection against wetness, and estrogen replacement in the form of vaginal cream, vaginal tablets, or a time-released vaginal ring.     For vaginal atrophy (thinning of the vaginal tissue that can cause dryness and burning) and UTI prevention we discussed estrogen replacement in the form of vaginal cream.   Start vaginal estrogen therapy nightly for two weeks then 2 times weekly at night. This can be placed with your finger or an applicator inside the vagina and around the urethra.  Please let us know if the prescription is too expensive and we can look for alternative options.   Is vaginal estrogen therapy safe for me? Vaginal estrogen preparations act on the vaginal skin, and only a very tiny amount is absorbed into the bloodstream (0.01%).  They work in a similar way to hand or face cream.  There is minimal absorption and they are therefore perfectly safe. If you have had breast cancer and have persistent troublesome symptoms which aren't settling with vaginal moisturisers and lubricants, local estrogen treatment may be a possibility, but consultation with your oncologist should take place first.   Women should try to eat at least 21 to 25 grams of fiber a day, while men should aim for 30 to 38 grams a day. You can add fiber to your diet with food or a fiber supplement such as psyllium  (metamucil), benefiber, or fibercon.   Here's a look at how much dietary fiber is found in some common foods. When buying packaged foods, check the Nutrition Facts label for fiber content. It can vary among brands.  Fruits Serving size Total fiber (grams)*  Raspberries 1 cup 8.0  Pear 1 medium 5.5  Apple, with skin 1 medium 4.5  Banana 1 medium 3.0  Orange 1 medium 3.0  Strawberries 1 cup 3.0   Vegetables Serving size Total fiber (grams)*  Green peas, boiled 1 cup 9.0  Broccoli, boiled 1 cup chopped 5.0  Turnip greens, boiled 1 cup 5.0  Brussels sprouts, boiled 1 cup 4.0  Potato, with skin, baked 1 medium 4.0  Sweet corn, boiled 1 cup 3.5  Cauliflower, raw 1 cup chopped 2.0  Carrot, raw 1 medium 1.5   Grains Serving size Total fiber (grams)*  Spaghetti, whole-wheat, cooked 1 cup 6.0  Barley, pearled, cooked 1 cup 6.0  Bran flakes 3/4 cup 5.5  Quinoa, cooked 1 cup 5.0  Oat bran muffin 1 medium 5.0  Oatmeal, instant, cooked 1 cup 5.0  Popcorn, air-popped 3 cups 3.5  Brown rice, cooked 1 cup 3.5  Bread, whole-wheat 1 slice 2.0  Bread, rye 1 slice 2.0   Legumes, nuts and seeds Serving size Total fiber (grams)*  Split peas, boiled 1 cup 16.0  Lentils, boiled 1 cup 15.5  Black beans, boiled 1 cup 15.0  Baked beans, canned 1 cup 10.0  Chia seeds 1 ounce 10.0  Almonds 1 ounce (23 nuts) 3.5  Pistachios 1 ounce (49 nuts) 3.0  Sunflower kernels 1 ounce 3.0  *Rounded to  nearest 0.5 gram. Source: [employer] for [employer]	[place]

## 2023-04-19 ENCOUNTER — Other Ambulatory Visit: Payer: Self-pay

## 2023-04-19 ENCOUNTER — Ambulatory Visit
Admission: RE | Admit: 2023-04-19 | Discharge: 2023-04-19 | Disposition: A | Discharge status: Home / Self Care | Payer: Commercial Managed Care - PPO | Source: Ambulatory Visit | Attending: Family Medicine

## 2023-04-19 VITALS — BP 128/84 | HR 68 | Temp 97.9°F | Resp 16 | Ht 66.0 in | Wt 223.0 lb

## 2023-04-19 DIAGNOSIS — F419 Anxiety disorder, unspecified: Secondary | ICD-10-CM | POA: Diagnosis not present

## 2023-04-19 NOTE — Discharge Instructions (Signed)
 Take the anxiolytic your previously prescribed follow-up with your psychiatrist Go to the emergency department for new symptoms or concerns

## 2023-04-19 NOTE — ED Triage Notes (Signed)
 Pt presents with chief complaint of anxiety. Pt states she is working with her provider to wean from her anxiety medications, Wellbutrin  & Lexapro . Pt states she woke up this morning and was having racing thoughts. Denies SI and HI. Pt did take one dose of her leftover Lexapro  medication. The only new medication patient has added is weight loss medication. Decrease in appetite.

## 2023-04-19 NOTE — ED Provider Notes (Signed)
 [facility]    CSN: [id number] Arrival date & time: 04/19/23  1501      History   Chief Complaint Chief Complaint  Patient presents with   Anxiety    Nausea - Entered by patient    HPI Cheryl Owens is a 58 y.o. female.    Anxiety  Not feeling herself for several days, on Thursday had fatigue could not get out of bed which she attributed to sleeping less than normal when getting up earlier than normal.  States she has been on social media at night which keeps her up late, feels it may be affecting her mood due to all by doom and gloom.  Yesterday felt anxious for no particular reason, she treated it by taking a walk outside which helped her mood.  Today having low-level anxiety, has had several episodes of diarrhea today.  Has had anxiety in the past. Denies excessive stress, chest pain, shortness of breath, abdominal pain, vomiting, fever, chills, sweats, rhinorrhea, nasal congestion, cough.  She has a psychiatrist, she works for her psychiatrist and will be able to see him/her tomorrow she has alprazolam  at home   Past Medical History:  Diagnosis Date   Anxiety    Cellulitis    diagnosed Sept.7 2021, left upper  posterior arm,  right upper posterior arm   Depression    Dry mouth    Fibroids    Gallbladder disease    High cholesterol    HTN (hypertension) 2007   seen in ER  with headaches   Hypertension    Hypokalemia 2007   due to HCTZ   Nervousness    Obesity    Pre-diabetes    Stress    Swelling of lower extremity    Trouble in sleeping     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   Eustachian tube dysfunction, right 09/11/2021   Dysphagia 12/26/2020   Postoperative state 11/28/2019   Cellulitis of right upper extremity 11/17/2019   Impacted cerumen of left ear 03/29/2019    Otitis externa 03/29/2019   Prediabetes 03/06/2019   Class 2 severe obesity with serious comorbidity and body mass index (BMI) of 36.0 to 36.9 in adult (HCC) 01/17/2019   Stage 3a chronic kidney disease (HCC) 12/23/2018   Vitamin D  deficiency 12/23/2018   Severe major depression without psychotic features (HCC) 11/10/2017   Preventative health care 06/08/2017   Elevated serum creatinine 03/13/2017   Depression 04/19/2015   Uterine fibroid 04/13/2014   Plantar fasciitis 04/13/2014   Positive urine drug screen 09/29/2013   Nausea alone 09/13/2013   Hypotension, unspecified 09/13/2013   Anxiety state 09/13/2013   Diarrhea 09/13/2013   Obesity (BMI 30-39.9) 02/15/2013   FASTING HYPERGLYCEMIA 09/28/2008   SNORING, HX OF 10/04/2007   Hyperlipidemia LDL goal <100 05/05/2007   Essential hypertension 05/05/2007   METABOLIC SYNDROME X 04/29/2007    Past Surgical History:  Procedure Laterality Date   arm surgery     post trauma @ age 85   BREAST SURGERY Right 2016   BREAST BX    CHOLECYSTECTOMY     gall stones   GANGLION CYST EXCISION N/A    wrist   ROBOTIC ASSISTED TOTAL HYSTERECTOMY WITH BILATERAL SALPINGO OOPHERECTOMY Bilateral 11/28/2019   Procedure: XI ROBOTIC ASSISTED TOTAL LAPAROSCOPIC HYSTERECTOMY WITH BILATERAL SALPINGO OOPHORECTOMY;  Surgeon: Lavoie, Marie-Lyne, MD;  Location: [facility];  Service: Gynecology;  Laterality: Bilateral;  request 2 1/2 hours OR time request 8:30am OR start in [place] [facility] block   WISDOM TOOTH EXTRACTION      OB History     Gravida  4   Para  1   Term  1   Preterm      AB  3   Living  1      SAB      IAB  3   Ectopic      Multiple      Live Births  1            Home Medications    Prior to Admission medications   Medication Sig Start Date End Date Taking? Authorizing Provider  atorvastatin  (LIPITOR) 20 MG tablet Take 1 tablet (20 mg total) by mouth daily. 12/08/22   Antonio Cyndee Jamee JONELLE, DO   clonazePAM  (KLONOPIN ) 0.5 MG tablet Take 0.5 tablets (0.25 mg total) by mouth in the morning AND 1 tablet (0.5 mg total) at bedtime. 10/21/22   Cottle, Lorene KANDICE Raddle., MD  estradiol  (ESTRACE ) 0.1 MG/GM vaginal cream Place 0.5g per vagina at bedtime for two weeks.  Then insert 0.5 gm per vagina twice a week thereafter. 04/09/23   Guadlupe Lianne DASEN, MD  labetalol  (NORMODYNE ) 100 MG tablet Take 1 tablet (100 mg total) by mouth 2 (two) times daily. 12/08/22   Antonio Cyndee Jamee JONELLE, DO  metFORMIN  (GLUCOPHAGE -XR) 500 MG 24 hr tablet Take 2 tablets (1,000 mg total) by mouth daily. 08/06/22   Antonio Cyndee Jamee JONELLE, DO  omeprazole  (PRILOSEC) 20 MG capsule Take 1 capsule (20 mg total) by mouth daily. 03/10/23   Antonio Cyndee Jamee JONELLE, DO  spironolactone  (ALDACTONE ) 25 MG tablet Take 1 tablet (25 mg total) by mouth 2 (two) times daily. 12/08/22   Antonio Cyndee Jamee JONELLE, DO  Vitamin D , Ergocalciferol , (DRISDOL ) 1.25 MG (50000 UNIT) CAPS capsule Take 1 capsule by mouth once a week 03/06/23   Antonio Cyndee, Jamee R, DO  ZEPBOUND  2.5 MG/0.5ML injection vial INJECT 0.5 ML (2.5 MG) UNDER THE SKIN ONCE WEEKLY (0.5ML= 50 UNITS) 03/26/23   Antonio Cyndee Jamee JONELLE, DO    Family History Family History  Problem Relation Age of Onset   Hyperlipidemia Mother    Hypertension Mother    Depression Mother    Anxiety disorder Mother    Obesity Mother    Prostate cancer Father    Cancer Father    Cancer Maternal Grandmother        thyroid    Hypertension Maternal Grandmother    Hypertension Paternal Grandmother    Breast cancer Maternal Aunt 49   Kidney disease Maternal Aunt         X 2; 1 on Dialysis   Cancer Maternal Aunt        ? primary; also had HTN   Hypertension Maternal Aunt        X 2   Bipolar disorder Paternal Aunt    Prostate cancer Paternal Uncle          3 uncles   Stroke Neg Hx    Diabetes Neg Hx    Heart disease Neg Hx    Colon cancer Neg Hx    Uterine cancer Neg Hx    Bladder Cancer Neg Hx     Social  History Social History   Tobacco Use   Smoking status: Never   Smokeless  tobacco: Never  Vaping Use   Vaping status: Never Used  Substance Use Topics   Alcohol use: No   Drug use: No     Allergies   Hydrochlorothiazide and Benazepril   Review of Systems Review of Systems   Physical Exam Triage Vital Signs ED Triage Vitals  Encounter Vitals Group     BP 04/19/23 1516 128/84     Systolic BP Percentile --      Diastolic BP Percentile --      Pulse Rate 04/19/23 1516 68     Resp 04/19/23 1516 16     Temp 04/19/23 1516 97.9 F (36.6 C)     Temp Source 04/19/23 1516 Oral     SpO2 04/19/23 1516 99 %     Weight 04/19/23 1529 223 lb (101.2 kg)     Height 04/19/23 1529 5' 6 (1.676 m)     Head Circumference --      Peak Flow --      Pain Score 04/19/23 1528 0     Pain Loc --      Pain Education --      Exclude from Growth Chart --    No data found.  Updated Vital Signs BP 128/84 (BP Location: Right Arm)   Pulse 68   Temp 97.9 F (36.6 C) (Oral)   Resp 16   Ht 5' 6 (1.676 m)   Wt 223 lb (101.2 kg)   LMP 12/01/2018 (Approximate)   SpO2 99%   BMI 35.99 kg/m   Visual Acuity Right Eye Distance:   Left Eye Distance:   Bilateral Distance:    Right Eye Near:   Left Eye Near:    Bilateral Near:     Physical Exam Constitutional:      Appearance: She is not ill-appearing.  HENT:     Head: Normocephalic and atraumatic.     Mouth/Throat:     Mouth: Mucous membranes are moist.  Cardiovascular:     Rate and Rhythm: Normal rate and regular rhythm.     Heart sounds: Normal heart sounds.  Pulmonary:     Effort: Pulmonary effort is normal.     Breath sounds: Normal breath sounds.  Neurological:     Mental Status: She is alert and oriented to person, place, and time.  Psychiatric:        Mood and Affect: Mood normal.        Behavior: Behavior normal.        Thought Content: Thought content normal.        Judgment: Judgment normal.      UC Treatments /  Results  Labs (all labs ordered are listed, but only abnormal results are displayed) Labs Reviewed - No data to display  EKG   Radiology No results found.  Procedures Procedures (including critical care time)  Medications Ordered in UC Medications - No data to display  Initial Impression / Assessment and Plan / UC Course  I have reviewed the triage vital signs and the nursing notes.  Pertinent labs & imaging results that were available during my care of the patient were reviewed by me and considered in my medical decision making (see chart for details).  Discussed with patient she should take her alprazolam  today as previously prescribed and follow-up with her psychiatrist tomorrow, ED for severe symptoms Final Clinical Impressions(s) / UC Diagnoses   Final diagnoses:  Anxiety     Discharge Instructions      Take the anxiolytic your previously  prescribed follow-up with your psychiatrist Go to the emergency department for new symptoms or concerns   ED Prescriptions   None    PDMP not reviewed this encounter.   Justine Cossin, [place] 04/19/23 1555

## 2023-04-20 ENCOUNTER — Encounter: Payer: Self-pay | Admitting: Family Medicine

## 2023-04-20 ENCOUNTER — Telehealth: Payer: Self-pay | Admitting: Psychiatry

## 2023-04-20 NOTE — Telephone Encounter (Signed)
 Cheryl Owens [id number] [birth year] 58 y.o.  Subjective:   Patient ID:  Cheryl Owens is a 58 y.o. (DOB Aug 17, 1965) female.  Chief Complaint:  No chief complaint on file.   Cheryl Owens presents to the office today for follow-up of depression and anxiety and history of insomnia and Bz withdrawal.    seen in June and December 2020.  No meds were changed.  She remained on Wellbutrin  XL 300 mg, Lexapro  10 mg, vitamin D , and clonazepam  0.5 mg twice daily as needed.  Usually takes clonazepam  0.5 mg 1/2 AM and 1 in PM.  02/06/2020 appointment with the following noted: Still doing well overall.  Hasn't tried to reduce.  Don't want to ever go back to feeling like she did with the episode. Laid off and another person also.  Had 33 years with the company.  Got 6 mos severance pay.  Needs another job but not desperate over it.  Made money with real estate sales.  Hysterectomy went well. Overall feels ok about things but doesn't want to stop meds.  No panic. Recognizes some social anxiety.   Better with weight control.  Doing Healthy Weight and Wellness including doctor including mental health provider and counseling to help weight loss.   Work life balance is better overall. Plan: No med changes  07/31/2020 appointment with the following noted: Got laid off after 34 years and 9 mos. Hasn't found another job yet.  Would rather change fields.  Look for low stress and low responsibility. Also worried about agism.  Has interview tomorrow with [facility] physicians.  Not stretched for money.  Doesn't want to manage anyone or have too much responsibility. Emotionally things are pretty much the same.  D doing well with job.   No concerns with meds.   Patient reports stable mood and denies depressed or irritable moods.  Patient denies any recent difficulty with anxiety.  Patient denies difficulty with sleep initiation or maintenance. 6 hours; hard to let go of phone.  Denies appetite disturbance.  Patient reports  that energy and motivation have been good.  Patient denies any difficulty with concentration.  Patient denies any suicidal ideation. No panic. Hx noncompliance bc forgets.  Is using a pill box, and is aware if she forgets it. Fearful to decrease meds. Plan no changes  04/03/2021 appointment with the following noted: Feel pretty good.  Some concerns about stopping meds. Some family stress. Disc weight loss.   No panic.   Not depressed. Pending endo. Lives alone. Not sleepy during the day. But D concerned that she might have OSA.  Loud snoring. Patient reports stable mood and denies depressed or irritable moods.  Patient denies any recent difficulty with anxiety.  Patient denies difficulty with sleep initiation or maintenance. Denies appetite disturbance.  Patient reports that energy and motivation have been good.  Patient denies any difficulty with concentration.  Patient denies any suicidal ideation.  10/10/21 appt noted: No problems off the Wellbutrin . Just on Lexapro  10 and clonzaepm 0.5 mg 1/2 in AM and 1 at night. 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  Tried PT, dry needling, and a shot.  Hurts so much wants to cry.  Started Amoxicillin  for infection and Advil. Wonders if Lexapro  is causing teeth clenching. Tried Flexeril  5 mg sleepy and limited benefit. Emotionally feels fine.  No dep or anxiety.  04/17/22 appt noted: Hasn't gone a complete week without Lexapro . Bruxism is much better.  Not completely back to normal. She's going  to try night guard by dentist. On Lexapro  10 mg daily and clonazepam  0.5 mg 1/2 in AM and 1 at night.   Wants to try reducing Lexapro .  Feels like prior depression might be situational. Patient reports stable mood and denies depressed or irritable moods.  Patient denies any recent difficulty with anxiety.  Patient denies difficulty with sleep initiation or maintenance. Denies appetite disturbance.  Patient reports that energy and motivation have been good.   Patient denies any difficulty with concentration.  Patient denies any suicidal ideation. Plan: Doing well. Wean Lexapro  over a couple of weeks to see if it is causing bruxism.  10/21/22 appt noted: Bruxism is better not 100% gone.  Not unbearable like it was.  When yawn it was still there.  Totally off Lexapro  for mos. A little more irritable of the Lexapro .  Not sure she wants to go back on it.  No sig anxiety except dentist and takes Xanax .    Bad gag reflex with fear choking.   No sig anxiety overall.  Stress M dep with hx psych hosp.  Had polio as a child.  Goes to church with her.  M issues with sleep and on Ambien.  58 yo.  Still on clonazepam  0.25 mg AM and 0.5 mg HS.  04/20/23 urgent appt :  Been off Lexapro  for a year and been ok.   Med: clonazepam  0.5 mg HS.  Only psych med. Recently more down, anxious, tearful. No px starting Zepbound about 10 weeks ago without much wt loss so far.  Maybe more dehydration lately. Has felt so bad went to urgent care yesterday.  No fluids.   Couldn't work today.   No new stressors and no reason for this. Bruxism not much different off the Lexapro .  Not better nor worse.  Past Psychiatric Medication Trials:  Wellbutrin , Lexapro  10 clonazepam ,   Adderall,   Episode depression early life after D born and then again later.  Review of Systems:  Review of Systems  HENT:         Jaw tightness and pain  Respiratory:  Negative for chest tightness.   Cardiovascular:  Negative for chest pain and palpitations.  Neurological:  Negative for tremors.  Psychiatric/Behavioral:  Negative for agitation, behavioral problems, confusion, decreased concentration, dysphoric mood, self-injury, sleep disturbance and suicidal ideas. The patient is not nervous/anxious and is not hyperactive.     Medications: I have reviewed the patient's current medications.  Current Outpatient Medications  Medication Sig Dispense Refill  . atorvastatin  (LIPITOR) 20 MG tablet Take  1 tablet (20 mg total) by mouth daily. 90 tablet 1  . clonazePAM  (KLONOPIN ) 0.5 MG tablet Take 0.5 tablets (0.25 mg total) by mouth in the morning AND 1 tablet (0.5 mg total) at bedtime. 135 tablet 1  . estradiol  (ESTRACE ) 0.1 MG/GM vaginal cream Place 0.5g per vagina at bedtime for two weeks.  Then insert 0.5 gm per vagina twice a week thereafter. 30 g 3  . labetalol  (NORMODYNE ) 100 MG tablet Take 1 tablet (100 mg total) by mouth 2 (two) times daily. 180 tablet 1  . metFORMIN  (GLUCOPHAGE -XR) 500 MG 24 hr tablet Take 2 tablets (1,000 mg total) by mouth daily. 180 tablet 1  . omeprazole  (PRILOSEC) 20 MG capsule Take 1 capsule (20 mg total) by mouth daily. 90 capsule 1  . spironolactone  (ALDACTONE ) 25 MG tablet Take 1 tablet (25 mg total) by mouth 2 (two) times daily. 180 tablet 1  . Vitamin D , Ergocalciferol , (DRISDOL ) 1.25  MG (50000 UNIT) CAPS capsule Take 1 capsule by mouth once a week 12 capsule 0  . ZEPBOUND 2.5 MG/0.5ML injection vial INJECT 0.5 ML (2.5 MG) UNDER THE SKIN ONCE WEEKLY (0.5ML= 50 UNITS) 2 mL 0   No current facility-administered medications for this visit.    Medication Side Effects: None  Allergies:  Allergies  Allergen Reactions  . Hydrochlorothiazide     REACTION: hypokalemia  . Benazepril Nausea And Vomiting    abd cramps    Past Medical History:  Diagnosis Date  . Anxiety   . Cellulitis    diagnosed Sept.7 2021, left upper  posterior arm,  right upper posterior arm  . Depression   . Dry mouth   . Fibroids   . Gallbladder disease   . High cholesterol   . HTN (hypertension) 2007   seen in ER  with headaches  . Hypertension   . Hypokalemia 2007   due to HCTZ  . Nervousness   . Obesity   . Pre-diabetes   . Stress   . Swelling of lower extremity   . Trouble in sleeping     Family History  Problem Relation Age of Onset  . Hyperlipidemia Mother   . Hypertension Mother   . Depression Mother   . Anxiety disorder Mother   . Obesity Mother   .  Prostate cancer Father   . Cancer Father   . Cancer Maternal Grandmother        thyroid   . Hypertension Maternal Grandmother   . Hypertension Paternal Grandmother   . Breast cancer Maternal Aunt 51  . Kidney disease Maternal Aunt         X 2; 1 on Dialysis  . Cancer Maternal Aunt        ? primary; also had HTN  . Hypertension Maternal Aunt        X 2  . Bipolar disorder Paternal Aunt   . Prostate cancer Paternal Uncle          3 uncles  . Stroke Neg Hx   . Diabetes Neg Hx   . Heart disease Neg Hx   . Colon cancer Neg Hx   . Uterine cancer Neg Hx   . Bladder Cancer Neg Hx     Social History   Socioeconomic History  . Marital status: Single    Spouse name: Not on file  . Number of children: 1  . Years of education: Not on file  . Highest education level: Bachelor's degree (e.g., BA, AB, BS)  Occupational History  . Occupation: Agricultural consultant  Tobacco Use  . Smoking status: Never  . Smokeless tobacco: Never  Vaping Use  . Vaping status: Never Used  Substance and Sexual Activity  . Alcohol use: No  . Drug use: No  . Sexual activity: Not Currently    Birth control/protection: Surgical, Abstinence    Comment: 1st intercourse- 15, partners- more than 5, hysterectomy  Other Topics Concern  . Not on file  Social History Narrative   No exercise    Social Drivers of Health   Financial Resource Strain: Low Risk  (07/31/2022)   Overall Financial Resource Strain (CARDIA)   . Difficulty of Paying Living Expenses: Not hard at all  Food Insecurity: No Food Insecurity (07/31/2022)   Hunger Vital Sign   . Worried About Programme researcher, broadcasting/film/video in the Last Year: Never true   . Ran Out of Food in the Last Year: Never true  Transportation Needs: No  Transportation Needs (07/31/2022)   PRAPARE - Transportation   . Lack of Transportation (Medical): No   . Lack of Transportation (Non-Medical): No  Physical Activity: Unknown (07/31/2022)   Exercise Vital Sign   . Days of Exercise per  Week: 0 days   . Minutes of Exercise per Session: Not on file  Stress: No Stress Concern Present (07/31/2022)   [employer] of Occupational Health - Occupational Stress Questionnaire   . Feeling of Stress : Not at all  Social Connections: Moderately Isolated (07/31/2022)   Social Connection and Isolation Panel [NHANES]   . Frequency of Communication with Friends and Family: More than three times a week   . Frequency of Social Gatherings with Friends and Family: More than three times a week   . Attends Religious Services: More than 4 times per year   . Active Member of Clubs or Organizations: No   . Attends Banker Meetings: Not on file   . Marital Status: Never married  Catering manager Violence: Not on file    Past Medical History, Surgical history, Social history, and Family history were reviewed and updated as appropriate.   Please see review of systems for further details on the patient's review from today.   Objective:   Physical Exam:  LMP 12/01/2018 (Approximate)   Physical Exam Constitutional:      General: She is not in acute distress.    Appearance: She is well-developed.  Musculoskeletal:        General: No deformity.  Neurological:     Mental Status: She is alert and oriented to person, place, and time.     Motor: No tremor.     Coordination: Coordination normal.     Gait: Gait normal.  Psychiatric:        Attention and Perception: She is attentive. She does not perceive auditory or visual hallucinations.        Mood and Affect: Mood is not anxious or depressed. Affect is not labile or tearful.        Speech: Speech normal.        Behavior: Behavior normal.        Thought Content: Thought content normal. Thought content is not delusional. Thought content does not include homicidal or suicidal ideation. Thought content does not include suicidal plan.        Cognition and Memory: Cognition normal.        Judgment: Judgment normal.     Comments:  Insight intact. No auditory or visual hallucinations. No delusions.      Lab Review:     Component Value Date/Time   NA 141 02/11/2023 0739   NA 143 12/08/2018 0820   K 3.8 02/11/2023 0739   CL 108 02/11/2023 0739   CO2 25 02/11/2023 0739   GLUCOSE 86 02/11/2023 0739   BUN 15 02/11/2023 0739   BUN 12 12/08/2018 0820   CREATININE 1.28 (H) 02/11/2023 0739   CREATININE 1.44 (H) 11/07/2019 0848   CALCIUM  9.4 02/11/2023 0739   PROT 6.7 02/11/2023 0739   PROT 6.4 12/08/2018 0820   ALBUMIN 3.9 02/11/2023 0739   ALBUMIN 4.0 12/08/2018 0820   AST 14 02/11/2023 0739   ALT 12 02/11/2023 0739   ALKPHOS 111 02/11/2023 0739   BILITOT 0.7 02/11/2023 0739   BILITOT 0.4 12/08/2018 0820   GFRNONAA 53 (L) 11/18/2019 1446   GFRAA >60 11/18/2019 1446       Component Value Date/Time   WBC 7.0 02/11/2023 0739  RBC 4.65 02/11/2023 0739   HGB 12.6 02/11/2023 0739   HGB 12.8 05/05/2018 1252   HCT 39.0 02/11/2023 0739   HCT 40.4 05/05/2018 1252   PLT 291.0 02/11/2023 0739   MCV 83.9 02/11/2023 0739   MCV 86 05/05/2018 1252   MCH 28.2 11/28/2019 1403   MCHC 32.3 02/11/2023 0739   RDW 13.9 02/11/2023 0739   RDW 13.6 05/05/2018 1252   LYMPHSABS 2.2 02/11/2023 0739   LYMPHSABS 2.1 05/05/2018 1252   MONOABS 0.4 02/11/2023 0739   EOSABS 0.1 02/11/2023 0739   EOSABS 0.1 05/05/2018 1252   BASOSABS 0.0 02/11/2023 0739   BASOSABS 0.0 05/05/2018 1252    No results found for: "POCLITH", "LITHIUM"   No results found for: "PHENYTOIN", "PHENOBARB", "VALPROATE", "CBMZ"   Corrected low vitamin D  2020  .res Assessment: Plan:    There are no diagnoses linked to this encounter.    face to face time with patient was spent on counseling and coordination of care. .  Could also help with her weight loss which is a goal.  Has gotten much better at setting boundaries with work and not overworking.  Mood is therefore better. Discussed meds and SE.   We discussed the short-term risks associated with  benzodiazepines including sedation and increased fall risk among others.  Discussed long-term side effect risk including dependence, potential withdrawal symptoms, and the potential eventual dose-related risk of dementia.  But recent studies from 2020 dispute this association between benzodiazepines and dementia risk. Newer studies in 2020 do not support an association with dementia.  Doing well off Lexapro .  But she might go back on for irritability if needed.  Wean clonazepam   by 0.25 mg every couple of week if possible.  Xanax  for flyinng and dental visits.   FU 12 mos.  Nori Beat, MD, DFAPA   Please see After Visit Summary for patient specific instructions.  Future Appointments  Date Time Provider Department Center  07/06/2023  1:40 PM Darlene Ehlers, MD [facility]     No orders of the defined types were placed in this encounter.     -------------------------------

## 2023-04-21 ENCOUNTER — Telehealth: Payer: Self-pay

## 2023-04-21 ENCOUNTER — Ambulatory Visit: Payer: Commercial Managed Care - PPO | Admitting: Family Medicine

## 2023-04-21 VITALS — BP 112/72 | HR 80 | Temp 97.9°F | Resp 18 | Ht 66.0 in | Wt 224.0 lb

## 2023-04-21 DIAGNOSIS — N1831 Chronic kidney disease, stage 3a: Secondary | ICD-10-CM

## 2023-04-21 DIAGNOSIS — R8281 Pyuria: Secondary | ICD-10-CM | POA: Diagnosis not present

## 2023-04-21 DIAGNOSIS — I10 Essential (primary) hypertension: Secondary | ICD-10-CM | POA: Diagnosis not present

## 2023-04-21 DIAGNOSIS — F419 Anxiety disorder, unspecified: Secondary | ICD-10-CM | POA: Diagnosis not present

## 2023-04-21 DIAGNOSIS — R7303 Prediabetes: Secondary | ICD-10-CM | POA: Diagnosis not present

## 2023-04-21 DIAGNOSIS — E785 Hyperlipidemia, unspecified: Secondary | ICD-10-CM | POA: Diagnosis not present

## 2023-04-21 DIAGNOSIS — E559 Vitamin D deficiency, unspecified: Secondary | ICD-10-CM | POA: Diagnosis not present

## 2023-04-21 LAB — POC URINALSYSI DIPSTICK (AUTOMATED)
Bilirubin, UA: NEGATIVE
Blood, UA: NEGATIVE
Glucose, UA: NEGATIVE
Ketones, UA: NEGATIVE
Nitrite, UA: NEGATIVE
Protein, UA: NEGATIVE
Spec Grav, UA: 1.01 (ref 1.010–1.025)
Urobilinogen, UA: 0.2 U/dL
pH, UA: 6 (ref 5.0–8.0)

## 2023-04-21 NOTE — Telephone Encounter (Signed)
Initial Comment Caller states she is calling for herself. She was told to call and speak with a triage nurse before coming in. She has dehydration and anxiety issues. Translation No Nurse Assessment Nurse: Raynelle Highland, RN, Misty Stanley Date/Time (Eastern Time): 04/21/2023 3:36:33 PM Confirm and document reason for call. If symptomatic, describe symptoms. ---Caller states she is dehydrated, complains of feeling this way since Thursday. Caller has been drinking gatorade and fluids. Denies fever, seen in [facility] Sunday for dehydration and anxiety. Caller is on Zepbound. And restarted her Lexapro for the anxiety. Caller denies any cold/flu symptoms at this time. Does the patient have any new or worsening symptoms? ---Yes Will a triage be completed? ---Yes Related visit to physician within the last 2 weeks? ---Yes Does the PT have any chronic conditions? (i.e. diabetes, asthma, this includes High risk factors for pregnancy, etc.) ---Yes List chronic conditions. ---Anxiety, HTN, cholesterol issue, obesity, Is this a behavioral health or substance abuse call? ---No Guidelines Guideline Title Affirmed Question Affirmed Notes Nurse Date/Time (Eastern Time) Anxiety and Panic Attack MODERATE anxiety (e.g., persistent or frequent anxiety symptoms; interferes with sleep, school, or work) Raynelle Highland, Charity fundraiser, Misty Stanley 04/21/2023 3:43:56 PM PLEASE NOTE: All timestamps contained within this report are represented as [place] Standard Time. CONFIDENTIALTY NOTICE: This fax transmission is intended only for the addressee. It contains information that is legally privileged, confidential or otherwise protected from use or disclosure. If you are not the intended recipient, you are strictly prohibited from reviewing, disclosing, copying using or disseminating any of this information or taking any action in reliance on or regarding this information. If you have received this fax in error, please notify us immediately by telephone so  that we can arrange for its return to [place]. Phone: [phone], Toll-Free: [phone], Fax: [phone] Page: 2 of 2 Call Id: [id number] Disp. Time Lamount Cohen Time) Disposition Final User 04/21/2023 3:48:43 PM SEE PCP WITHIN 3 DAYS Yes Leos, RN, Misty Stanley Final Disposition 04/21/2023 3:48:43 PM SEE PCP WITHIN 3 DAYS Yes Leos, RN, Gracy Racer Disagree/Comply Comply Caller Understands Yes PreDisposition Call Doctor Care Advice Given Per Guideline SEE PCP WITHIN 3 DAYS: * You need to be seen within 2 or 3 days. ANXIETY - HEALTHY LIFESTYLE TIPS: * There are things you can do to feel better. CALL BACK IF: * You feel like harming yourself * You become worse CARE ADVICE given per Anxiety and Panic Attack (Adult) guideline. Comments User: Lura Em, RN Date/Time Lamount Cohen Time): 04/21/2023 3:49:00 PM Caller has appointment today at 1740 Referrals REFERRED TO PCP OFFICE

## 2023-04-21 NOTE — Progress Notes (Unsigned)
   Established Patient Office Visit  Subjective   Patient ID: Cheryl Owens, female    DOB: 1965/06/12  Age: 58 y.o. MRN: [id number]  No chief complaint on file.   HPI  {History (Optional):23778}  ROS    Objective:     BP 112/72   Pulse 80   Temp 97.9 F (36.6 C)   Resp 18   Ht 5\' 6"  (1.676 m)   Wt 224 lb (101.6 kg)   LMP 12/01/2018 (Approximate)   SpO2 100%   BMI 36.15 kg/m  {Vitals History (Optional):23777}  Physical Exam   No results found for any visits on 04/21/23.  {Labs (Optional):23779}  The 10-year ASCVD risk score (Arnett DK, et al., 2019) is: 3.6%    Assessment & Plan:   Problem List Items Addressed This Visit       Unprioritized   Vitamin D deficiency   Prediabetes - Primary   Relevant Orders   POCT Urinalysis Dipstick (Automated)   Stage 3a chronic kidney disease (HCC)   Relevant Orders   CBC with Differential/Platelet   Comprehensive metabolic panel   Hyperlipidemia LDL goal <100   Essential hypertension   Relevant Orders   Comprehensive metabolic panel   Lipid panel   TSH   Other Visit Diagnoses       Anxiety       Relevant Medications   escitalopram (LEXAPRO) 10 MG tablet   Other Relevant Orders   CBC with Differential/Platelet   Comprehensive metabolic panel   TSH   POCT Urinalysis Dipstick (Automated)       No follow-ups on file.    Donato Schultz, DO

## 2023-04-22 ENCOUNTER — Encounter: Payer: Self-pay | Admitting: Family Medicine

## 2023-04-22 LAB — CBC WITH DIFFERENTIAL/PLATELET
Basophils Absolute: 0.1 10*3/uL (ref 0.0–0.1)
Basophils Relative: 1.1 % (ref 0.0–3.0)
Eosinophils Absolute: 0.1 10*3/uL (ref 0.0–0.7)
Eosinophils Relative: 0.8 % (ref 0.0–5.0)
HCT: 38.6 % (ref 36.0–46.0)
Hemoglobin: 12.7 g/dL (ref 12.0–15.0)
Lymphocytes Relative: 27.3 % (ref 12.0–46.0)
Lymphs Abs: 2.2 10*3/uL (ref 0.7–4.0)
MCHC: 33 g/dL (ref 30.0–36.0)
MCV: 83.2 fL (ref 78.0–100.0)
Monocytes Absolute: 0.4 10*3/uL (ref 0.1–1.0)
Monocytes Relative: 5.2 % (ref 3.0–12.0)
Neutro Abs: 5.2 10*3/uL (ref 1.4–7.7)
Neutrophils Relative %: 65.6 % (ref 43.0–77.0)
Platelets: 301 10*3/uL (ref 150.0–400.0)
RBC: 4.65 Mil/uL (ref 3.87–5.11)
RDW: 13.6 % (ref 11.5–15.5)
WBC: 7.9 10*3/uL (ref 4.0–10.5)

## 2023-04-22 LAB — COMPREHENSIVE METABOLIC PANEL
ALT: 11 U/L (ref 0–35)
AST: 16 U/L (ref 0–37)
Albumin: 4.3 g/dL (ref 3.5–5.2)
Alkaline Phosphatase: 102 U/L (ref 39–117)
BUN: 15 mg/dL (ref 6–23)
CO2: 23 meq/L (ref 19–32)
Calcium: 9.5 mg/dL (ref 8.4–10.5)
Chloride: 106 meq/L (ref 96–112)
Creatinine, Ser: 1.27 mg/dL — ABNORMAL HIGH (ref 0.40–1.20)
GFR: 46.8 mL/min — ABNORMAL LOW (ref >60.00)
Glucose, Bld: 89 mg/dL (ref 70–99)
Potassium: 3.9 meq/L (ref 3.5–5.1)
Sodium: 139 meq/L (ref 135–145)
Total Bilirubin: 0.6 mg/dL (ref 0.2–1.2)
Total Protein: 7.4 g/dL (ref 6.0–8.3)

## 2023-04-22 LAB — URINE CULTURE
MICRO NUMBER:: 16069617
SPECIMEN QUALITY:: ADEQUATE

## 2023-04-22 LAB — LIPID PANEL
Cholesterol: 137 mg/dL (ref 0–200)
HDL: 52 mg/dL (ref >39.00)
LDL Cholesterol: 73 mg/dL (ref 0–99)
NonHDL: 85.37
Total CHOL/HDL Ratio: 3
Triglycerides: 60 mg/dL (ref 0.0–149.0)
VLDL: 12 mg/dL (ref 0.0–40.0)

## 2023-04-22 LAB — TSH: TSH: 1.68 u[IU]/mL (ref 0.35–5.50)

## 2023-04-23 ENCOUNTER — Encounter: Payer: Self-pay | Admitting: Family Medicine

## 2023-04-23 NOTE — Telephone Encounter (Signed)
Copied from CRM [id number]. Topic: Clinical - Request for Lab/Test Order >> Apr 23, 2023  4:05 PM Cheryl Owens wrote: Reason for CRM: Patient called in wanting to know if she could come tomorrow to have her blood sugar tested / please call [phone]

## 2023-04-23 NOTE — Telephone Encounter (Signed)
Patient is calling back in regarding her blood sugar she is feeling weak and tired she need's a call back today

## 2023-04-24 ENCOUNTER — Other Ambulatory Visit: Payer: Self-pay | Admitting: Family Medicine

## 2023-04-24 DIAGNOSIS — R7303 Prediabetes: Secondary | ICD-10-CM

## 2023-04-27 ENCOUNTER — Telehealth: Payer: Self-pay | Admitting: Psychiatry

## 2023-04-27 NOTE — Telephone Encounter (Signed)
 Relapse dep with tearfulness, anxiety after awhile off Lexapro.  Restarted 1 week ago Lexapor 10 mg daily.  Sx bad enough affected ability to work and other causes.  No clear trigger.  Plan increase Lexapro 15 mg daily in hopes of speeding recovery. Continue clonazepam 0.5 mg BID prn She agrees. Meredith Staggers, MD, DFAPA

## 2023-05-25 ENCOUNTER — Other Ambulatory Visit: Payer: Self-pay | Admitting: Family Medicine

## 2023-05-25 ENCOUNTER — Other Ambulatory Visit: Payer: Self-pay | Admitting: Psychiatry

## 2023-05-25 DIAGNOSIS — F419 Anxiety disorder, unspecified: Secondary | ICD-10-CM

## 2023-05-25 DIAGNOSIS — F5105 Insomnia due to other mental disorder: Secondary | ICD-10-CM

## 2023-05-25 DIAGNOSIS — F4001 Agoraphobia with panic disorder: Secondary | ICD-10-CM

## 2023-05-26 ENCOUNTER — Other Ambulatory Visit (HOSPITAL_COMMUNITY): Payer: Self-pay

## 2023-05-26 MED ORDER — METFORMIN HCL ER 500 MG PO TB24
1000.0000 mg | ORAL_TABLET | Freq: Every day | ORAL | 1 refills | Status: DC
Start: 2023-05-26 — End: 2023-12-10
  Filled 2023-05-26: qty 180, 90d supply, fill #0
  Filled 2023-07-27 – 2023-09-14 (×3): qty 180, 90d supply, fill #1

## 2023-05-26 MED ORDER — CLONAZEPAM 0.5 MG PO TABS
ORAL_TABLET | ORAL | 1 refills | Status: DC
  Filled 2023-05-26: qty 135, 90d supply, fill #0
  Filled 2023-07-27 – 2023-09-14 (×3): qty 135, 90d supply, fill #1

## 2023-05-29 ENCOUNTER — Other Ambulatory Visit (HOSPITAL_COMMUNITY): Payer: Self-pay

## 2023-05-29 MED ORDER — ESCITALOPRAM OXALATE 10 MG PO TABS
10.0000 mg | ORAL_TABLET | Freq: Every day | ORAL | 3 refills | Status: DC
  Filled 2023-05-29: qty 90, 90d supply, fill #0
  Filled 2023-07-27 – 2023-09-14 (×3): qty 90, 90d supply, fill #1
  Filled 2023-11-05: qty 90, 90d supply, fill #2

## 2023-06-30 ENCOUNTER — Ambulatory Visit: Admitting: Family Medicine

## 2023-06-30 ENCOUNTER — Ambulatory Visit
Admission: RE | Admit: 2023-06-30 | Discharge: 2023-06-30 | Disposition: A | Discharge status: Home / Self Care | Source: Ambulatory Visit | Attending: Family Medicine | Admitting: Family Medicine

## 2023-06-30 ENCOUNTER — Other Ambulatory Visit (HOSPITAL_COMMUNITY): Payer: Self-pay

## 2023-06-30 VITALS — BP 124/84 | HR 74 | Temp 98.8°F | Resp 16

## 2023-06-30 DIAGNOSIS — J019 Acute sinusitis, unspecified: Secondary | ICD-10-CM

## 2023-06-30 DIAGNOSIS — J029 Acute pharyngitis, unspecified: Secondary | ICD-10-CM | POA: Diagnosis not present

## 2023-06-30 LAB — POC COVID19/FLU A&B COMBO
Covid Antigen, POC: NEGATIVE
Influenza A Antigen, POC: NEGATIVE
Influenza B Antigen, POC: NEGATIVE

## 2023-06-30 LAB — POCT RAPID STREP A (OFFICE): Rapid Strep A Screen: NEGATIVE

## 2023-06-30 MED ORDER — FLUTICASONE PROPIONATE 50 MCG/ACT NA SUSP
2.0000 | Freq: Every day | NASAL | 0 refills | Status: DC
  Filled 2023-06-30: qty 16, 30d supply, fill #0

## 2023-06-30 MED ORDER — AMOXICILLIN 875 MG PO TABS
875.0000 mg | ORAL_TABLET | Freq: Two times a day (BID) | ORAL | 0 refills | Status: AC
  Filled 2023-06-30: qty 14, 7d supply, fill #0

## 2023-06-30 MED ORDER — METHYLPREDNISOLONE 4 MG PO TBPK
ORAL_TABLET | ORAL | 0 refills | Status: DC
  Filled 2023-06-30: qty 21, 6d supply, fill #0

## 2023-06-30 MED ORDER — CHLORPHEN-PE-ACETAMINOPHEN 4-10-325 MG PO TABS
1.0000 | ORAL_TABLET | Freq: Three times a day (TID) | ORAL | 0 refills | Status: DC
  Filled 2023-06-30: qty 21, 7d supply, fill #0

## 2023-06-30 NOTE — ED Provider Notes (Signed)
 [facility]    CSN: [id number] Arrival date & time: 06/30/23  4010      History   Chief Complaint Chief Complaint  Patient presents with   Sore Throat    RUNNY nose and keeps me from breathing well at night so my throat is sore and I have head congestion no fever - Entered by patient   Appointment    HPI Cheryl Owens is a 58 y.o. female.     Cheryl Owens is a 58 year old female presenting with a 4-day history of sore throat, nasal congestion, runny nose, sinus pressure, and a mild dry cough. She denies chills, sweats, fever, fatigue, difficulty swallowing, shortness of breath, wheezing, nausea, vomiting, diarrhea, body aches, dizziness, or headache. She initially began taking allergy medication without improvement in her symptoms and switched to an over-the-counter cold and flu remedy yesterday, which also provided no relief. Today, she reports feeling more tired and possibly dehydrated, noting a dry mouth and increased thirst. She has a history of prediabetes, with her last A1c documented at 5.6 in December 2024. She is currently taking metformin  and Zepbound.      Past Medical History:  Diagnosis Date   Anxiety    Cellulitis    diagnosed Sept.7 2021, left upper  posterior arm,  right upper posterior arm   Depression    Dry mouth    Fibroids    Gallbladder disease    High cholesterol    HTN (hypertension) 2007   seen in ER  with headaches   Hypertension    Hypokalemia 2007   due to HCTZ   Nervousness    Obesity    Pre-diabetes    Stress    Swelling of lower extremity    Trouble in sleeping     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   Eustachian tube dysfunction, right 09/11/2021   Dysphagia 12/26/2020   Postoperative state 11/28/2019   Cellulitis of right upper extremity  11/17/2019   Impacted cerumen of left ear 03/29/2019   Otitis externa 03/29/2019   Prediabetes 03/06/2019   Class 2 severe obesity with serious comorbidity and body mass index (BMI) of 36.0 to 36.9 in adult (HCC) 01/17/2019   Stage 3a chronic kidney disease (HCC) 12/23/2018   Vitamin D  deficiency 12/23/2018   Severe major depression without psychotic features (HCC) 11/10/2017   Preventative health care 06/08/2017   Elevated serum creatinine 03/13/2017   Depression 04/19/2015   Uterine fibroid 04/13/2014   Plantar fasciitis 04/13/2014   Positive urine drug screen 09/29/2013   Nausea alone 09/13/2013   Hypotension, unspecified 09/13/2013   Anxiety state 09/13/2013   Diarrhea 09/13/2013   Obesity (BMI 30-39.9) 02/15/2013   FASTING HYPERGLYCEMIA 09/28/2008   SNORING, HX OF 10/04/2007   Hyperlipidemia LDL goal <100 05/05/2007   Essential hypertension 05/05/2007   METABOLIC SYNDROME X 04/29/2007    Past Surgical History:  Procedure Laterality Date   arm surgery     post trauma @ age 70   BREAST SURGERY Right 2016   BREAST BX    CHOLECYSTECTOMY     gall stones   GANGLION CYST EXCISION N/A    wrist   ROBOTIC ASSISTED TOTAL HYSTERECTOMY WITH BILATERAL SALPINGO OOPHERECTOMY Bilateral 11/28/2019   Procedure: XI ROBOTIC ASSISTED TOTAL LAPAROSCOPIC HYSTERECTOMY WITH BILATERAL SALPINGO OOPHORECTOMY;  Surgeon:  Lavoie, Marie-Lyne, MD;  Location: [facility];  Service: Gynecology;  Laterality: Bilateral;  request 2 1/2 hours OR time request 8:30am OR start in [facility]   WISDOM TOOTH EXTRACTION      OB History     Gravida  4   Para  1   Term  1   Preterm      AB  3   Living  1      SAB      IAB  3   Ectopic      Multiple      Live Births  1            Home Medications    Prior to Admission medications   Medication Sig Start Date End Date Taking? Authorizing Provider  amoxicillin  (AMOXIL ) 875 MG tablet Take 1 tablet (875 mg total) by  mouth 2 (two) times daily for 7 days. 06/30/23 07/07/23 Yes Maryruth Sol, FNP  Chlorphen-PE-Acetaminophen  4-10-325 MG TABS Take 1 tablet by mouth in the morning, at noon, and at bedtime. 06/30/23  Yes Maryruth Sol, FNP  fluticasone  (FLONASE ) 50 MCG/ACT nasal spray Place 2 sprays into both nostrils daily. 06/30/23  Yes Maryruth Sol, FNP  methylPREDNISolone  (MEDROL  DOSEPAK) 4 MG TBPK tablet Take as directed 06/30/23  Yes Maryruth Sol, FNP  atorvastatin  (LIPITOR) 20 MG tablet Take 1 tablet (20 mg total) by mouth daily. 12/08/22   Estill Hemming, DO  clonazePAM  (KLONOPIN ) 0.5 MG tablet Take 0.5 tablets (0.25 mg total) by mouth in the morning AND 1 tablet (0.5 mg total) at bedtime. 05/26/23   Cottle, Kennedy Peabody., MD  escitalopram  (LEXAPRO ) 10 MG tablet Take 1 tablet (10 mg total) by mouth daily. 05/29/23   Estill Hemming, DO  estradiol  (ESTRACE ) 0.1 MG/GM vaginal cream Place 0.5g per vagina at bedtime for two weeks.  Then insert 0.5 gm per vagina twice a week thereafter. 04/09/23   Darlene Ehlers, MD  labetalol  (NORMODYNE ) 100 MG tablet Take 1 tablet (100 mg total) by mouth 2 (two) times daily. 12/08/22   Estill Hemming, DO  metFORMIN  (GLUCOPHAGE -XR) 500 MG 24 hr tablet Take 2 tablets (1,000 mg total) by mouth daily. 05/26/23   Estill Hemming, DO  omeprazole  (PRILOSEC) 20 MG capsule Take 1 capsule (20 mg total) by mouth daily. 03/10/23   Estill Hemming, DO  spironolactone  (ALDACTONE ) 25 MG tablet Take 1 tablet (25 mg total) by mouth 2 (two) times daily. 12/08/22   Estill Hemming, DO  Vitamin D , Ergocalciferol , (DRISDOL ) 1.25 MG (50000 UNIT) CAPS capsule Take 1 capsule by mouth once a week 03/06/23   Crecencio Dodge, Yvonne R, DO  ZEPBOUND 2.5 MG/0.5ML injection vial INJECT 0.5 ML (2.5 MG) UNDER THE SKIN ONCE WEEKLY (0.5ML= 50 UNITS) 04/25/23   Estill Hemming, DO    Family History Family History  Problem Relation Age of Onset   Hyperlipidemia Mother     Hypertension Mother    Depression Mother    Anxiety disorder Mother    Obesity Mother    Prostate cancer Father    Cancer Father    Cancer Maternal Grandmother        thyroid    Hypertension Maternal Grandmother    Hypertension Paternal Grandmother    Breast cancer Maternal Aunt 7   Kidney disease Maternal Aunt         X 2; 1 on Dialysis   Cancer Maternal  Aunt        ? primary; also had HTN   Hypertension Maternal Aunt        X 2   Bipolar disorder Paternal Aunt    Prostate cancer Paternal Uncle          3 uncles   Stroke Neg Hx    Diabetes Neg Hx    Heart disease Neg Hx    Colon cancer Neg Hx    Uterine cancer Neg Hx    Bladder Cancer Neg Hx     Social History Social History   Tobacco Use   Smoking status: Never   Smokeless tobacco: Never  Vaping Use   Vaping status: Never Used  Substance Use Topics   Alcohol use: No   Drug use: No     Allergies   Hydrochlorothiazide and Benazepril   Review of Systems Review of Systems  Constitutional:  Negative for chills, diaphoresis, fatigue and fever.  HENT:  Positive for congestion, rhinorrhea, sinus pressure and sore throat. Negative for trouble swallowing.   Respiratory:  Positive for cough (a mild, dry cough). Negative for shortness of breath and wheezing.   Gastrointestinal:  Negative for diarrhea, nausea and vomiting.  Musculoskeletal:  Negative for myalgias.  Neurological:  Negative for dizziness and headaches.  All other systems reviewed and are negative.    Physical Exam Triage Vital Signs ED Triage Vitals  Encounter Vitals Group     BP 06/30/23 1040 124/84     Systolic BP Percentile --      Diastolic BP Percentile --      Pulse Rate 06/30/23 1040 74     Resp 06/30/23 1040 16     Temp 06/30/23 1040 98.8 F (37.1 C)     Temp Source 06/30/23 1040 Oral     SpO2 06/30/23 1040 96 %     Weight --      Height --      Head Circumference --      Peak Flow --      Pain Score 06/30/23 1044 5     Pain Loc  --      Pain Education --      Exclude from Growth Chart --    No data found.  Updated Vital Signs BP 124/84 (BP Location: Right Arm)   Pulse 74   Temp 98.8 F (37.1 C) (Oral)   Resp 16   LMP 12/01/2018 (Approximate)   SpO2 96%   Visual Acuity Right Eye Distance:   Left Eye Distance:   Bilateral Distance:    Right Eye Near:   Left Eye Near:    Bilateral Near:     Physical Exam Vitals reviewed.  Constitutional:      General: She is awake. She is not in acute distress.    Appearance: Normal appearance. She is well-developed and well-groomed. She is not ill-appearing, toxic-appearing or diaphoretic.  HENT:     Head: Normocephalic.     Right Ear: Hearing, tympanic membrane, ear canal and external ear normal. No drainage, swelling or tenderness. No middle ear effusion. Tympanic membrane is not erythematous.     Left Ear: Hearing, tympanic membrane, ear canal and external ear normal. No drainage, swelling or tenderness.  No middle ear effusion. Tympanic membrane is not erythematous.     Nose: Congestion present.     Right Sinus: No maxillary sinus tenderness or frontal sinus tenderness.     Left Sinus: No maxillary sinus tenderness or frontal sinus tenderness.  Mouth/Throat:     Lips: Pink.     Mouth: Mucous membranes are moist.     Pharynx: Uvula midline. Posterior oropharyngeal erythema and postnasal drip present. No pharyngeal swelling, oropharyngeal exudate or uvula swelling.     Tonsils: No tonsillar exudate or tonsillar abscesses.  Eyes:     General: Vision grossly intact.     Conjunctiva/sclera: Conjunctivae normal.  Cardiovascular:     Rate and Rhythm: Normal rate and regular rhythm.     Heart sounds: Normal heart sounds.  Pulmonary:     Effort: Pulmonary effort is normal.     Breath sounds: Normal breath sounds and air entry.  Musculoskeletal:        General: Normal range of motion.     Cervical back: Full passive range of motion without pain, normal range of  motion and neck supple.  Lymphadenopathy:     Cervical: Cervical adenopathy present.  Skin:    General: Skin is warm and dry.  Neurological:     General: No focal deficit present.     Mental Status: She is alert and oriented to person, place, and time.  Psychiatric:        Mood and Affect: Mood normal.        Behavior: Behavior normal. Behavior is cooperative.      UC Treatments / Results  Labs (all labs ordered are listed, but only abnormal results are displayed) Labs Reviewed  POC COVID19/FLU A&B COMBO  POCT RAPID STREP A (OFFICE)    EKG   Radiology No results found.  Procedures Procedures (including critical care time)  Medications Ordered in UC Medications - No data to display  Initial Impression / Assessment and Plan / UC Course  I have reviewed the triage vital signs and the nursing notes.  Pertinent labs & imaging results that were available during my care of the patient were reviewed by me and considered in my medical decision making (see chart for details).    58 year old female presenting with a 4-day history of sore throat, nasal congestion, rhinorrhea, sinus pressure, and dry cough. She is afebrile and nontoxic. Physical examination reveals nasal congestion, posterior oropharyngeal erythema, postnasal drainage, and nontender cervical lymphadenopathy. Rapid testing for influenza, COVID-19, and strep were all negative. Patient's presentation is consistent with acute sinusitis. She was prescribed amoxicillin , a Medrol  Dosepak, Norel AD, and Flonase . Supportive care measures including hydration, rest, and sinus rinses were discussed. Indications for follow-up and return precautions were reviewed with the patient.  Today's evaluation has revealed no signs of a dangerous process. Discussed diagnosis with patient and/or guardian. Patient and/or guardian aware of their diagnosis, possible red flag symptoms to watch out for and need for close follow up. Patient and/or  guardian understands verbal and written discharge instructions. Patient and/or guardian comfortable with plan and disposition.  Patient and/or guardian has a clear mental status at this time, good insight into illness (after discussion and teaching) and has clear judgment to make decisions regarding their care  Documentation was completed with the aid of voice recognition software. Transcription may contain typographical errors. Final Clinical Impressions(s) / UC Diagnoses   Final diagnoses:  Acute sore throat  Acute sinusitis, recurrence not specified, unspecified location     Discharge Instructions      You have a sinus infection, also known as sinusitis, which is inflammation of the sinuses. Your symptoms are most likely due to allergies. Allergies can cause inflammation and mucus buildup, leading to pressure, congestion, and discomfort.  Take  all prescribed medications exactly as directed, even if you begin to feel better. You may use Tylenol  or ibuprofen for pain or fever. Drink plenty of fluids to stay hydrated and help thin mucus. Using a cool mist humidifier and inhaling steam for 10-15 minutes several times a day can ease nasal congestion. Try to avoid exposure to dry or cold air and sleep with your head elevated to reduce post-nasal drainage.  Get adequate rest each night to support your recovery. Once your symptoms improve and you finish your medications, replace your toothbrush to avoid reinfection. If your symptoms do not improve or return after treatment, follow up with your healthcare provider.     ED Prescriptions     Medication Sig Dispense Auth. Provider   amoxicillin  (AMOXIL ) 875 MG tablet Take 1 tablet (875 mg total) by mouth 2 (two) times daily for 7 days. 14 tablet Concha Sudol, Nixon, FNP   methylPREDNISolone  (MEDROL  DOSEPAK) 4 MG TBPK tablet Take as directed 21 tablet Maryruth Sol, FNP   Chlorphen-PE-Acetaminophen  4-10-325 MG TABS Take 1 tablet by mouth in the  morning, at noon, and at bedtime. 21 tablet Maryruth Sol, FNP   fluticasone  (FLONASE ) 50 MCG/ACT nasal spray Place 2 sprays into both nostrils daily. 16 g Maryruth Sol, FNP      PDMP not reviewed this encounter.   Maryruth Sol, [place] 06/30/23 1505

## 2023-06-30 NOTE — Discharge Instructions (Addendum)
 You have a sinus infection, also known as sinusitis, which is inflammation of the sinuses. Your symptoms are most likely due to allergies. Allergies can cause inflammation and mucus buildup, leading to pressure, congestion, and discomfort.  Take all prescribed medications exactly as directed, even if you begin to feel better. You may use Tylenol  or ibuprofen for pain or fever. Drink plenty of fluids to stay hydrated and help thin mucus. Using a cool mist humidifier and inhaling steam for 10-15 minutes several times a day can ease nasal congestion. Try to avoid exposure to dry or cold air and sleep with your head elevated to reduce post-nasal drainage.  Get adequate rest each night to support your recovery. Once your symptoms improve and you finish your medications, replace your toothbrush to avoid reinfection. If your symptoms do not improve or return after treatment, follow up with your healthcare provider.

## 2023-06-30 NOTE — ED Triage Notes (Signed)
 Pt symptoms began Saturday as sore throat the next day she developed congestion and is having trouble sleeping at night due to congestion.

## 2023-07-06 ENCOUNTER — Ambulatory Visit: Payer: Commercial Managed Care - PPO | Admitting: Obstetrics

## 2023-07-07 ENCOUNTER — Encounter: Payer: Self-pay | Admitting: Family Medicine

## 2023-07-07 ENCOUNTER — Other Ambulatory Visit: Payer: Self-pay | Admitting: Family Medicine

## 2023-07-07 DIAGNOSIS — E559 Vitamin D deficiency, unspecified: Secondary | ICD-10-CM

## 2023-07-07 MED ORDER — TIRZEPATIDE-WEIGHT MANAGEMENT 5 MG/0.5ML ~~LOC~~ SOLN
5.0000 mg | SUBCUTANEOUS | 0 refills | Status: DC
  Filled 2023-07-07: qty 3, 42d supply, fill #0

## 2023-07-08 ENCOUNTER — Other Ambulatory Visit: Payer: Self-pay | Admitting: Family Medicine

## 2023-07-08 ENCOUNTER — Other Ambulatory Visit (HOSPITAL_COMMUNITY): Payer: Self-pay

## 2023-07-08 MED ORDER — TIRZEPATIDE-WEIGHT MANAGEMENT 5 MG/0.5ML ~~LOC~~ SOLN
5.0000 mg | SUBCUTANEOUS | 0 refills | Status: DC
Start: 2023-07-08 — End: 2023-07-08

## 2023-07-08 MED ORDER — TIRZEPATIDE-WEIGHT MANAGEMENT 2.5 MG/0.5ML ~~LOC~~ SOLN: 2.5000 mg | SUBCUTANEOUS | 0 refills | Status: DC

## 2023-07-08 MED ORDER — VITAMIN D (ERGOCALCIFEROL) 1.25 MG (50000 UNIT) PO CAPS: 50000.0000 [IU] | ORAL_CAPSULE | ORAL | 0 refills | Status: DC

## 2023-07-08 NOTE — Addendum Note (Signed)
 Addended by: Carlyon Chill on: 07/08/2023 08:48 AM   Modules accepted: Orders

## 2023-07-08 NOTE — Addendum Note (Signed)
 Addended by: Vita Grip  M on: 07/08/2023 01:36 PM   Modules accepted: Orders

## 2023-07-27 ENCOUNTER — Other Ambulatory Visit: Payer: Self-pay | Admitting: Family Medicine

## 2023-07-27 ENCOUNTER — Encounter: Payer: Self-pay | Admitting: Family Medicine

## 2023-07-27 DIAGNOSIS — E785 Hyperlipidemia, unspecified: Secondary | ICD-10-CM

## 2023-07-27 DIAGNOSIS — E559 Vitamin D deficiency, unspecified: Secondary | ICD-10-CM

## 2023-07-27 DIAGNOSIS — I10 Essential (primary) hypertension: Secondary | ICD-10-CM

## 2023-07-28 ENCOUNTER — Other Ambulatory Visit (HOSPITAL_COMMUNITY): Payer: Self-pay

## 2023-07-28 MED ORDER — VITAMIN D (ERGOCALCIFEROL) 1.25 MG (50000 UNIT) PO CAPS: 50000.0000 [IU] | ORAL_CAPSULE | ORAL | 2 refills | Status: AC

## 2023-07-28 MED ORDER — ATORVASTATIN CALCIUM 20 MG PO TABS
20.0000 mg | ORAL_TABLET | Freq: Every day | ORAL | 1 refills | Status: DC
  Filled 2023-07-28 – 2023-08-11 (×2): qty 90, 90d supply, fill #0
  Filled 2023-11-05: qty 90, 90d supply, fill #1

## 2023-07-28 MED ORDER — SPIRONOLACTONE 25 MG PO TABS
25.0000 mg | ORAL_TABLET | Freq: Two times a day (BID) | ORAL | 1 refills | Status: DC
  Filled 2023-07-28 – 2023-08-11 (×2): qty 180, 90d supply, fill #0
  Filled 2023-11-05: qty 180, 90d supply, fill #1

## 2023-08-07 ENCOUNTER — Encounter: Payer: Self-pay | Admitting: Family Medicine

## 2023-08-11 ENCOUNTER — Other Ambulatory Visit (HOSPITAL_COMMUNITY): Payer: Self-pay

## 2023-08-11 ENCOUNTER — Other Ambulatory Visit: Payer: Self-pay | Admitting: Family Medicine

## 2023-08-11 DIAGNOSIS — I10 Essential (primary) hypertension: Secondary | ICD-10-CM

## 2023-08-12 ENCOUNTER — Other Ambulatory Visit (HOSPITAL_COMMUNITY): Payer: Self-pay

## 2023-08-12 ENCOUNTER — Other Ambulatory Visit: Payer: Self-pay

## 2023-08-12 MED ORDER — LABETALOL HCL 100 MG PO TABS
100.0000 mg | ORAL_TABLET | Freq: Two times a day (BID) | ORAL | 0 refills | Status: DC
  Filled 2023-08-12: qty 180, 90d supply, fill #0

## 2023-08-18 ENCOUNTER — Ambulatory Visit: Payer: Commercial Managed Care - PPO | Admitting: Family Medicine

## 2023-08-24 ENCOUNTER — Encounter: Payer: Self-pay | Admitting: Family Medicine

## 2023-08-24 ENCOUNTER — Ambulatory Visit (INDEPENDENT_AMBULATORY_CARE_PROVIDER_SITE_OTHER): Admitting: Family Medicine

## 2023-08-24 VITALS — BP 124/84 | HR 75 | Temp 97.9°F | Ht 66.0 in | Wt 222.0 lb

## 2023-08-24 DIAGNOSIS — E559 Vitamin D deficiency, unspecified: Secondary | ICD-10-CM | POA: Diagnosis not present

## 2023-08-24 DIAGNOSIS — E785 Hyperlipidemia, unspecified: Secondary | ICD-10-CM | POA: Diagnosis not present

## 2023-08-24 DIAGNOSIS — E2839 Other primary ovarian failure: Secondary | ICD-10-CM

## 2023-08-24 DIAGNOSIS — Z0000 Encounter for general adult medical examination without abnormal findings: Secondary | ICD-10-CM | POA: Diagnosis not present

## 2023-08-24 DIAGNOSIS — R7303 Prediabetes: Secondary | ICD-10-CM

## 2023-08-24 DIAGNOSIS — I10 Essential (primary) hypertension: Secondary | ICD-10-CM | POA: Diagnosis not present

## 2023-08-24 DIAGNOSIS — N1831 Chronic kidney disease, stage 3a: Secondary | ICD-10-CM | POA: Diagnosis not present

## 2023-08-24 NOTE — Progress Notes (Signed)
 Established Patient Office Visit  Subjective   Patient ID: Cheryl Owens, female    DOB: Apr 02, 1965  Age: 58 y.o. MRN: [id number]  Chief Complaint  Patient presents with   Annual Exam    Pt is fasting.     HPI Discussed the use of AI scribe software for clinical note transcription with the patient, who gave verbal consent to proceed.  History of Present Illness Cheryl Owens is a 58 year old female who presents for follow-up regarding her medication regimen and exercise routine.  She has resumed taking Zepbound  at a dose of 2.5 mg since June 1st, after previously discontinuing it due to concerns about dehydration. She plans to extend the dosing interval to every ten days and is currently not experiencing any adverse effects such as nausea.  She has been exercising regularly, approximately five days a week since March, and feels better as a result. She exercises at [employer] on [address], which is conveniently located near her workplace. She reports increased stamina and feels less tired after physical activities such as walking.  She has started a subscription to [employer] meals, which are high in protein and low in carbohydrates, to aid in her dietary management. She finds the meals tasty and convenient, as they require minimal preparation.  Her family history includes her mother being diagnosed with stage one lung cancer, for which she underwent five radiation treatments. Her mother is a smoker and continues to smoke despite the diagnosis.  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 No new surgeries and her joints are okay, attributing any discomfort to age.   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   Eustachian tube dysfunction, right 09/11/2021    Dysphagia 12/26/2020   Postoperative state 11/28/2019   Cellulitis of right upper extremity 11/17/2019   Impacted cerumen of left ear 03/29/2019   Otitis externa 03/29/2019   Prediabetes 03/06/2019   Class 2 severe obesity with serious comorbidity and body mass index (BMI) of 36.0 to 36.9 in adult [facility]) 01/17/2019   Stage 3a chronic kidney disease (HCC) 12/23/2018   Vitamin D  deficiency 12/23/2018   Severe major depression without psychotic features (HCC) 11/10/2017   Preventative health care 06/08/2017   Elevated serum creatinine 03/13/2017   Depression 04/19/2015   Uterine fibroid 04/13/2014   Plantar fasciitis 04/13/2014   Positive urine drug screen 09/29/2013   Nausea alone 09/13/2013   Hypotension, unspecified 09/13/2013   Anxiety state 09/13/2013   Diarrhea 09/13/2013   Obesity (BMI 30-39.9) 02/15/2013   FASTING HYPERGLYCEMIA 09/28/2008   SNORING, HX OF 10/04/2007   Hyperlipidemia LDL goal <100 05/05/2007   Essential hypertension 05/05/2007   METABOLIC SYNDROME X 04/29/2007   Past Medical History:  Diagnosis Date   Anxiety    Cellulitis    diagnosed Sept.7 2021, left upper  posterior arm,  right upper posterior arm   Depression    Dry mouth    Fibroids    Gallbladder disease    High cholesterol    HTN (hypertension) 2007   seen in ER  with headaches   Hypertension    Hypokalemia 2007   due to HCTZ   Nervousness    Obesity    Pre-diabetes    Stress    Swelling  of lower extremity    Trouble in sleeping    Past Surgical History:  Procedure Laterality Date   arm surgery     post trauma @ age 28   BREAST SURGERY Right 2016   BREAST BX    CHOLECYSTECTOMY     gall stones   GANGLION CYST EXCISION N/A    wrist   ROBOTIC ASSISTED TOTAL HYSTERECTOMY WITH BILATERAL SALPINGO OOPHERECTOMY Bilateral 11/28/2019   Procedure: XI ROBOTIC ASSISTED TOTAL LAPAROSCOPIC HYSTERECTOMY WITH BILATERAL SALPINGO OOPHORECTOMY;  Surgeon: Lavoie, Marie-Lyne, MD;  Location: [facility]  [place];  Service: Gynecology;  Laterality: Bilateral;  request 2 1/2 hours OR time request 8:30am OR start in [place] [facility] block   WISDOM TOOTH EXTRACTION     Social History   Tobacco Use   Smoking status: Never   Smokeless tobacco: Never  Vaping Use   Vaping status: Never Used  Substance Use Topics   Alcohol use: No   Drug use: No   Social History   Socioeconomic History   Marital status: Single    Spouse name: Not on file   Number of children: 1   Years of education: Not on file   Highest education level: Bachelor's degree (e.g., BA, AB, BS)  Occupational History   Occupation: Agricultural consultant  Tobacco Use   Smoking status: Never   Smokeless tobacco: Never  Vaping Use   Vaping status: Never Used  Substance and Sexual Activity   Alcohol use: No   Drug use: No   Sexual activity: Not Currently    Birth control/protection: Surgical, Abstinence    Comment: 1st intercourse- 15, partners- more than 5, hysterectomy  Other Topics Concern   Not on file  Social History Narrative   Exercise ---  5 days , gym   Social Drivers of Health   Financial Resource Strain: Low Risk  (08/20/2023)   Overall Financial Resource Strain (CARDIA)    Difficulty of Paying Living Expenses: Not hard at all  Food Insecurity: No Food Insecurity (08/20/2023)   Hunger Vital Sign    Worried About Running Out of Food in the Last Year: Never true    Ran Out of Food in the Last Year: Never true  Transportation Needs: No Transportation Needs (08/20/2023)   PRAPARE - Administrator, Civil Service (Medical): No    Lack of Transportation (Non-Medical): No  Physical Activity: Sufficiently Active (08/20/2023)   Exercise Vital Sign    Days of Exercise per Week: 3 days    Minutes of Exercise per Session: 60 min  Stress: No Stress Concern Present (08/20/2023)   [employer] of Occupational Health - Occupational Stress Questionnaire    Feeling of Stress: Not at all  Social  Connections: Moderately Isolated (08/20/2023)   Social Connection and Isolation Panel    Frequency of Communication with Friends and Family: More than three times a week    Frequency of Social Gatherings with Friends and Family: More than three times a week    Attends Religious Services: More than 4 times per year    Active Member of [employer] or Organizations: No    Attends Engineer, structural: Not on file    Marital Status: Never married  Intimate Partner Violence: Not on file   Family Status  Relation Name Status   Mother  Alive   Father  Alive   MGM  (Not Specified)   PGM  (Not Specified)   Mat Aunt  (Not Specified)   Mat  Aunt  (Not Specified)   Mat Aunt  (Not Specified)   Bernarda Bride  (Not Specified)   Ala Alice  (Not Specified)   Neg Hx  (Not Specified)  No partnership data on file   Family History  Problem Relation Age of Onset   Hyperlipidemia Mother    Hypertension Mother    Depression Mother    Anxiety disorder Mother    Obesity Mother    Lung cancer Mother    Prostate cancer Father    Cancer Father    Cancer Maternal Grandmother        thyroid    Hypertension Maternal Grandmother    Hypertension Paternal Grandmother    Breast cancer Maternal Aunt 97   Kidney disease Maternal Aunt         X 2; 1 on Dialysis   Cancer Maternal Aunt        ? primary; also had HTN   Hypertension Maternal Aunt        X 2   Bipolar disorder Paternal Aunt    Prostate cancer Paternal Uncle          3 uncles   Stroke Neg Hx    Diabetes Neg Hx    Heart disease Neg Hx    Colon cancer Neg Hx    Uterine cancer Neg Hx    Bladder Cancer Neg Hx    Allergies  Allergen Reactions   Hydrochlorothiazide     REACTION: hypokalemia   Benazepril Nausea And Vomiting    abd cramps      ROS    Objective:     BP 124/84   Pulse 75   Temp 97.9 F (36.6 C)   Ht 5' 6 (1.676 m)   Wt 222 lb (100.7 kg)   LMP 12/01/2018 (Approximate)   SpO2 96%   BMI 35.83 kg/m  BP Readings from  Last 3 Encounters:  08/24/23 124/84  06/30/23 124/84  04/21/23 112/72   Wt Readings from Last 3 Encounters:  08/24/23 222 lb (100.7 kg)  04/21/23 224 lb (101.6 kg)  04/19/23 223 lb (101.2 kg)   SpO2 Readings from Last 3 Encounters:  08/24/23 96%  06/30/23 96%  04/21/23 100%      Physical Exam   No results found for any visits on 08/24/23.  Last CBC Lab Results  Component Value Date   WBC 7.9 04/21/2023   HGB 12.7 04/21/2023   HCT 38.6 04/21/2023   MCV 83.2 04/21/2023   MCH 28.2 11/28/2019   RDW 13.6 04/21/2023   PLT 301.0 04/21/2023   Last metabolic panel Lab Results  Component Value Date   GLUCOSE 89 04/21/2023   NA 139 04/21/2023   K 3.9 04/21/2023   CL 106 04/21/2023   CO2 23 04/21/2023   BUN 15 04/21/2023   CREATININE 1.27 (H) 04/21/2023   GFR 46.80 (L) 04/21/2023   CALCIUM  9.5 04/21/2023   PROT 7.4 04/21/2023   ALBUMIN 4.3 04/21/2023   LABGLOB 2.4 12/08/2018   AGRATIO 1.7 12/08/2018   BILITOT 0.6 04/21/2023   ALKPHOS 102 04/21/2023   AST 16 04/21/2023   ALT 11 04/21/2023   ANIONGAP 9 11/18/2019   Last lipids Lab Results  Component Value Date   CHOL 137 04/21/2023   HDL 52.00 04/21/2023   LDLCALC 73 04/21/2023   TRIG 60.0 04/21/2023   CHOLHDL 3 04/21/2023   Last hemoglobin A1c Lab Results  Component Value Date   HGBA1C 5.6 02/11/2023   Last thyroid  functions Lab Results  Component Value Date   TSH 1.68 04/21/2023   T3TOTAL 110 05/05/2018   Last vitamin D  Lab Results  Component Value Date   VD25OH 45.24 02/11/2023   Last vitamin B12 and Folate Lab Results  Component Value Date   VITAMINB12 605 05/05/2018   FOLATE 7.2 05/05/2018      The 10-year ASCVD risk score (Arnett DK, et al., 2019) is: 4%    Assessment & Plan:   Problem List Items Addressed This Visit       Unprioritized   Vitamin D  deficiency   Relevant Orders   DG Bone Density   Prediabetes   Relevant Orders   Hemoglobin A1c   Stage 3a chronic kidney  disease (HCC)   Relevant Orders   Comprehensive metabolic panel with GFR   Preventative health care - Primary   Relevant Orders   CBC with Differential/Platelet   Comprehensive metabolic panel with GFR   Lipid panel   TSH   Hyperlipidemia LDL goal <100   Relevant Orders   CBC with Differential/Platelet   Comprehensive metabolic panel with GFR   Lipid panel   TSH   Essential hypertension   Relevant Orders   CBC with Differential/Platelet   Comprehensive metabolic panel with GFR   TSH   Other Visit Diagnoses       Morbid obesity (HCC)       Relevant Orders   Hemoglobin A1c   VITAMIN D  25 Hydroxy (Vit-D Deficiency, Fractures)     Estrogen deficiency       Relevant Orders   DG Bone Density     Assessment and Plan Assessment & Plan Weight management   She resumed Zepbound  2.5 mg on June 1st, extending the dosing interval to every ten days to manage costs, given the medication's 21-day half-life. She reports no significant side effects, such as nausea. She exercises regularly five days a week and follows a high-protein, low-carb diet with [employer] meals, which she finds beneficial. The combination of medication, exercise, and diet is expected to aid in weight management. Continue Zepbound  2.5 mg with the extended dosing interval. Encourage continuation of regular exercise and maintain the high-protein, low-carb diet with [employer] meals.  Family history of lung cancer   Her mother was recently diagnosed with stage one lung cancer, detected through screening due to her smoking history. She underwent five radiation treatments, opting against surgery or chemotherapy due to her invasive nature. Radiation was chosen for its minimal side effects, such as fatigue.  General Health Maintenance   She is up to date with vaccinations, including the shingles vaccine, but declined the pneumonia vaccine despite recommendations for individuals over 50. She is aware of the option to receive it later,  possibly with the flu vaccine. Offer the pneumonia vaccine at a future visit, possibly with the flu vaccine.    Return in about 6 months (around 02/23/2024), or if symptoms worsen or fail to improve.    Eren Puebla R Lowne Chase, DO

## 2023-08-24 NOTE — Patient Instructions (Signed)
 Preventive Care 16-58 Years Old, Female  Preventive care refers to lifestyle choices and visits with your health care provider that can promote health and wellness. Preventive care visits are also called wellness exams.  What can I expect for my preventive care visit?  Counseling  Your health care provider may ask you questions about your:  Medical history, including:  Past medical problems.  Family medical history.  Pregnancy history.  Current health, including:  Menstrual cycle.  Method of birth control.  Emotional well-being.  Home life and relationship well-being.  Sexual activity and sexual health.  Lifestyle, including:  Alcohol, nicotine or tobacco, and drug use.  Access to firearms.  Diet, exercise, and sleep habits.  Work and work Astronomer.  Sunscreen use.  Safety issues such as seatbelt and bike helmet use.  Physical exam  Your health care provider will check your:  Height and weight. These may be used to calculate your BMI (body mass index). BMI is a measurement that tells if you are at a healthy weight.  Waist circumference. This measures the distance around your waistline. This measurement also tells if you are at a healthy weight and may help predict your risk of certain diseases, such as type 2 diabetes and high blood pressure.  Heart rate and blood pressure.  Body temperature.  Skin for abnormal spots.  What immunizations do I need?    Vaccines are usually given at various ages, according to a schedule. Your health care provider will recommend vaccines for you based on your age, medical history, and lifestyle or other factors, such as travel or where you work.  What tests do I need?  Screening  Your health care provider may recommend screening tests for certain conditions. This may include:  Lipid and cholesterol levels.  Diabetes screening. This is done by checking your blood sugar (glucose) after you have not eaten for a while (fasting).  Pelvic exam and Pap test.  Hepatitis B test.  Hepatitis C  test.  HIV (human immunodeficiency virus) test.  STI (sexually transmitted infection) testing, if you are at risk.  Lung cancer screening.  Colorectal cancer screening.  Mammogram. Talk with your health care provider about when you should start having regular mammograms. This may depend on whether you have a family history of breast cancer.  BRCA-related cancer screening. This may be done if you have a family history of breast, ovarian, tubal, or peritoneal cancers.  Bone density scan. This is done to screen for osteoporosis.  Talk with your health care provider about your test results, treatment options, and if necessary, the need for more tests.  Follow these instructions at home:  Eating and drinking    Eat a diet that includes fresh fruits and vegetables, whole grains, lean protein, and low-fat dairy products.  Take vitamin and mineral supplements as recommended by your health care provider.  Do not drink alcohol if:  Your health care provider tells you not to drink.  You are pregnant, may be pregnant, or are planning to become pregnant.  If you drink alcohol:  Limit how much you have to 0-1 drink a day.  Know how much alcohol is in your drink. In the [place] one drink equals one 12 oz bottle of beer (355 mL), one 5 oz glass of wine (148 mL), or one 1 oz glass of hard liquor (44 mL).  Lifestyle  Brush your teeth every morning and night with fluoride toothpaste. Floss one time each day.  Exercise for at least  30 minutes 5 or more days each week.  Do not use any products that contain nicotine or tobacco. These products include cigarettes, chewing tobacco, and vaping devices, such as e-cigarettes. If you need help quitting, ask your health care provider.  Do not use drugs.  If you are sexually active, practice safe sex. Use a condom or other form of protection to prevent STIs.  If you do not wish to become pregnant, use a form of birth control. If you plan to become pregnant, see your health care provider for a  prepregnancy visit.  Take aspirin only as told by your health care provider. Make sure that you understand how much to take and what form to take. Work with your health care provider to find out whether it is safe and beneficial for you to take aspirin daily.  Find healthy ways to manage stress, such as:  Meditation, yoga, or listening to music.  Journaling.  Talking to a trusted person.  Spending time with friends and family.  Minimize exposure to UV radiation to reduce your risk of skin cancer.  Safety  Always wear your seat belt while driving or riding in a vehicle.  Do not drive:  If you have been drinking alcohol. Do not ride with someone who has been drinking.  When you are tired or distracted.  While texting.  If you have been using any mind-altering substances or drugs.  Wear a helmet and other protective equipment during sports activities.  If you have firearms in your house, make sure you follow all gun safety procedures.  Seek help if you have been physically or sexually abused.  What's next?  Visit your health care provider once a year for an annual wellness visit.  Ask your health care provider how often you should have your eyes and teeth checked.  Stay up to date on all vaccines.  This information is not intended to replace advice given to you by your health care provider. Make sure you discuss any questions you have with your health care provider.  Document Revised: 08/22/2020 Document Reviewed: 08/22/2020  Elsevier Patient Education  2024 [employer].

## 2023-08-25 ENCOUNTER — Encounter: Payer: Self-pay | Admitting: Family Medicine

## 2023-08-25 LAB — COMPREHENSIVE METABOLIC PANEL WITH GFR
AG Ratio: 1.6 (calc) (ref 1.0–2.5)
ALT: 12 U/L (ref 6–29)
AST: 16 U/L (ref 10–35)
Albumin: 4.2 g/dL (ref 3.6–5.1)
Alkaline phosphatase (APISO): 104 U/L (ref 37–153)
BUN/Creatinine Ratio: 15 (calc) (ref 6–22)
BUN: 22 mg/dL (ref 7–25)
CO2: 20 mmol/L (ref 20–32)
Calcium: 9.9 mg/dL (ref 8.6–10.4)
Chloride: 106 mmol/L (ref 98–110)
Creat: 1.42 mg/dL — ABNORMAL HIGH (ref 0.50–1.03)
Globulin: 2.6 g/dL (ref 1.9–3.7)
Glucose, Bld: 82 mg/dL (ref 65–99)
Potassium: 5 mmol/L (ref 3.5–5.3)
Sodium: 138 mmol/L (ref 135–146)
Total Bilirubin: 0.6 mg/dL (ref 0.2–1.2)
Total Protein: 6.8 g/dL (ref 6.1–8.1)
eGFR: 43 mL/min/1.73m2 — ABNORMAL LOW

## 2023-08-25 LAB — CBC WITH DIFFERENTIAL/PLATELET
Absolute Lymphocytes: 1910 {cells}/uL (ref 850–3900)
Absolute Monocytes: 426 {cells}/uL (ref 200–950)
Basophils Absolute: 43 {cells}/uL (ref 0–200)
Basophils Relative: 0.6 %
Eosinophils Absolute: 128 {cells}/uL (ref 15–500)
Eosinophils Relative: 1.8 %
HCT: 40.1 % (ref 35.0–45.0)
Hemoglobin: 12.5 g/dL (ref 11.7–15.5)
MCH: 27.4 pg (ref 27.0–33.0)
MCHC: 31.2 g/dL — ABNORMAL LOW (ref 32.0–36.0)
MCV: 87.9 fL (ref 80.0–100.0)
MPV: 10.4 fL (ref 7.5–12.5)
Monocytes Relative: 6 %
Neutro Abs: 4594 {cells}/uL (ref 1500–7800)
Neutrophils Relative %: 64.7 %
Platelets: 267 10*3/uL (ref 140–400)
RBC: 4.56 10*6/uL (ref 3.80–5.10)
RDW: 12.8 % (ref 11.0–15.0)
Total Lymphocyte: 26.9 %
WBC: 7.1 10*3/uL (ref 3.8–10.8)

## 2023-08-25 LAB — TSH: TSH: 1.53 m[IU]/L (ref 0.40–4.50)

## 2023-08-25 LAB — LIPID PANEL
Cholesterol: 157 mg/dL (ref <200)
HDL: 58 mg/dL (ref >=50)
LDL Cholesterol (Calc): 85 mg/dL
Non-HDL Cholesterol (Calc): 99 mg/dL (ref <130)
Total CHOL/HDL Ratio: 2.7 (calc) (ref <5.0)
Triglycerides: 65 mg/dL (ref <150)

## 2023-08-25 LAB — HEMOGLOBIN A1C
Hgb A1c MFr Bld: 5.5 %
Mean Plasma Glucose: 111 mg/dL
eAG (mmol/L): 6.2 mmol/L

## 2023-08-25 LAB — VITAMIN D 25 HYDROXY (VIT D DEFICIENCY, FRACTURES): Vit D, 25-Hydroxy: 110 ng/mL — ABNORMAL HIGH (ref 30–100)

## 2023-08-29 ENCOUNTER — Encounter: Payer: Self-pay | Admitting: Family Medicine

## 2023-08-29 ENCOUNTER — Ambulatory Visit: Payer: Self-pay | Admitting: Family Medicine

## 2023-09-07 ENCOUNTER — Other Ambulatory Visit: Payer: Self-pay

## 2023-09-08 ENCOUNTER — Other Ambulatory Visit: Payer: Self-pay | Admitting: Family Medicine

## 2023-09-14 ENCOUNTER — Other Ambulatory Visit (HOSPITAL_COMMUNITY): Payer: Self-pay

## 2023-09-15 ENCOUNTER — Other Ambulatory Visit: Payer: Self-pay

## 2023-09-17 ENCOUNTER — Other Ambulatory Visit (HOSPITAL_COMMUNITY): Payer: Self-pay

## 2023-10-22 ENCOUNTER — Other Ambulatory Visit: Payer: Self-pay | Admitting: Family Medicine

## 2023-10-31 ENCOUNTER — Encounter: Payer: Self-pay | Admitting: Family Medicine

## 2023-11-02 ENCOUNTER — Other Ambulatory Visit: Payer: Self-pay | Admitting: Family Medicine

## 2023-11-02 DIAGNOSIS — R7303 Prediabetes: Secondary | ICD-10-CM

## 2023-11-02 MED ORDER — TIRZEPATIDE-WEIGHT MANAGEMENT 5 MG/0.5ML ~~LOC~~ SOLN: 5.0000 mg | SUBCUTANEOUS | 1 refills | Status: DC

## 2023-11-05 ENCOUNTER — Other Ambulatory Visit: Payer: Self-pay | Admitting: Family Medicine

## 2023-11-05 ENCOUNTER — Other Ambulatory Visit (HOSPITAL_COMMUNITY): Payer: Self-pay

## 2023-11-05 ENCOUNTER — Other Ambulatory Visit: Payer: Self-pay

## 2023-11-05 ENCOUNTER — Other Ambulatory Visit: Payer: Self-pay | Admitting: Medical Genetics

## 2023-11-05 DIAGNOSIS — I10 Essential (primary) hypertension: Secondary | ICD-10-CM

## 2023-11-05 DIAGNOSIS — Z006 Encounter for examination for normal comparison and control in clinical research program: Secondary | ICD-10-CM

## 2023-11-05 DIAGNOSIS — R1310 Dysphagia, unspecified: Secondary | ICD-10-CM

## 2023-11-05 MED ORDER — LABETALOL HCL 100 MG PO TABS
100.0000 mg | ORAL_TABLET | Freq: Two times a day (BID) | ORAL | 1 refills | Status: AC
  Filled 2023-11-05: qty 180, 90d supply, fill #0
  Filled 2024-03-08: qty 180, 90d supply, fill #1

## 2023-11-05 MED ORDER — OMEPRAZOLE 20 MG PO CPDR
20.0000 mg | DELAYED_RELEASE_CAPSULE | Freq: Every day | ORAL | 1 refills | Status: AC
  Filled 2023-11-05: qty 90, 90d supply, fill #0
  Filled 2024-03-08: qty 90, 90d supply, fill #1

## 2023-11-10 DIAGNOSIS — E785 Hyperlipidemia, unspecified: Secondary | ICD-10-CM | POA: Diagnosis not present

## 2023-11-10 DIAGNOSIS — I129 Hypertensive chronic kidney disease with stage 1 through stage 4 chronic kidney disease, or unspecified chronic kidney disease: Secondary | ICD-10-CM | POA: Diagnosis not present

## 2023-11-10 DIAGNOSIS — N1831 Chronic kidney disease, stage 3a: Secondary | ICD-10-CM | POA: Diagnosis not present

## 2023-11-10 DIAGNOSIS — E669 Obesity, unspecified: Secondary | ICD-10-CM | POA: Diagnosis not present

## 2023-12-01 ENCOUNTER — Other Ambulatory Visit (HOSPITAL_COMMUNITY): Payer: Self-pay

## 2023-12-01 ENCOUNTER — Ambulatory Visit (INDEPENDENT_AMBULATORY_CARE_PROVIDER_SITE_OTHER): Admitting: Psychiatry

## 2023-12-01 ENCOUNTER — Encounter: Payer: Self-pay | Admitting: Psychiatry

## 2023-12-01 DIAGNOSIS — F419 Anxiety disorder, unspecified: Secondary | ICD-10-CM

## 2023-12-01 DIAGNOSIS — F4001 Agoraphobia with panic disorder: Secondary | ICD-10-CM

## 2023-12-01 DIAGNOSIS — F5105 Insomnia due to other mental disorder: Secondary | ICD-10-CM | POA: Diagnosis not present

## 2023-12-01 DIAGNOSIS — F458 Other somatoform disorders: Secondary | ICD-10-CM | POA: Diagnosis not present

## 2023-12-01 DIAGNOSIS — F3342 Major depressive disorder, recurrent, in full remission: Secondary | ICD-10-CM | POA: Diagnosis not present

## 2023-12-01 DIAGNOSIS — F411 Generalized anxiety disorder: Secondary | ICD-10-CM

## 2023-12-01 MED ORDER — CLONAZEPAM 0.5 MG PO TABS
ORAL_TABLET | ORAL | 1 refills | Status: AC
  Filled 2023-12-01 – 2023-12-26 (×3): qty 135, 90d supply, fill #0
  Filled 2024-04-14: qty 135, 90d supply, fill #1

## 2023-12-01 MED ORDER — ESCITALOPRAM OXALATE 10 MG PO TABS
15.0000 mg | ORAL_TABLET | Freq: Every day | ORAL | 1 refills | Status: AC
  Filled 2023-12-01 – 2023-12-10 (×2): qty 135, 90d supply, fill #0
  Filled 2024-03-17: qty 135, 90d supply, fill #1

## 2023-12-01 NOTE — Progress Notes (Signed)
 Cheryl Owens [id number] [birth year] 58 y.o.  Subjective:   Patient ID:  Cheryl Owens is a 58 y.o. (DOB 06/25/1965) female.  Chief Complaint:  Chief Complaint  Patient presents with   Follow-up    Cheryl Owens presents to the office today for follow-up of depression and anxiety and history of insomnia and Bz withdrawal.    seen in June and December 2020.  No meds were changed.  She remained on Wellbutrin  XL 300 mg, Lexapro  10 mg, vitamin D , and clonazepam  0.5 mg twice daily as needed.  Usually takes clonazepam  0.5 mg 1/2 AM and 1 in PM.  02/06/2020 appointment with the following noted: Still doing well overall.  Hasn't tried to reduce.  Don't want to ever go back to feeling like she did with the episode. Laid off and another person also.  Had 33 years with the company.  Got 6 mos severance pay.  Needs another job but not desperate over it.  Made money with real estate sales.  Hysterectomy went well. Overall feels ok about things but doesn't want to stop meds.  No panic. Recognizes some social anxiety.   Better with weight control.  Doing Healthy Weight and Wellness including doctor including mental health provider and counseling to help weight loss.   Work life balance is better overall. Plan: No med changes  07/31/2020 appointment with the following noted: Got laid off after 34 years and 9 mos. Hasn't found another job yet.  Would rather change fields.  Look for low stress and low responsibility. Also worried about agism.  Has interview tomorrow with [facility].  Not stretched for money.  Doesn't want to manage anyone or have too much responsibility. Emotionally things are pretty much the same.  D doing well with job.   No concerns with meds.   Patient reports stable mood and denies depressed or irritable moods.  Patient denies any recent difficulty with anxiety.  Patient denies difficulty with sleep initiation or maintenance. 6 hours; hard to let go of phone.  Denies appetite  disturbance.  Patient reports that energy and motivation have been good.  Patient denies any difficulty with concentration.  Patient denies any suicidal ideation. No panic. Hx noncompliance bc forgets.  Is using a pill box, and is aware if she forgets it. Fearful to decrease meds. Plan no changes  04/03/2021 appointment with the following noted: Feel pretty good.  Some concerns about stopping meds. Some family stress. Disc weight loss.   No panic.   Not depressed. Pending endo. Lives alone. Not sleepy during the day. But D concerned that she might have OSA.  Loud snoring. Patient reports stable mood and denies depressed or irritable moods.  Patient denies any recent difficulty with anxiety.  Patient denies difficulty with sleep initiation or maintenance. Denies appetite disturbance.  Patient reports that energy and motivation have been good.  Patient denies any difficulty with concentration.  Patient denies any suicidal ideation.  10/10/21 appt noted: No problems off the Wellbutrin . Just on Lexapro  10 and clonzaepm 0.5 mg 1/2 in AM and 1 at night. 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  Tried PT, dry needling, and a shot.  Hurts so much wants to cry.  Started Amoxicillin  for infection and Advil. Wonders if Lexapro  is causing teeth clenching. Tried Flexeril  5 mg sleepy and limited benefit. Emotionally feels fine.  No dep or anxiety.  04/17/22 appt noted: Hasn't gone a complete week without Lexapro . Bruxism is much better.  Not completely  back to normal. She's going to try night guard by dentist. On Lexapro  10 mg daily and clonazepam  0.5 mg 1/2 in AM and 1 at night.   Wants to try reducing Lexapro .  Feels like prior depression might be situational. Patient reports stable mood and denies depressed or irritable moods.  Patient denies any recent difficulty with anxiety.  Patient denies difficulty with sleep initiation or maintenance. Denies appetite disturbance.  Patient reports that energy and  motivation have been good.  Patient denies any difficulty with concentration.  Patient denies any suicidal ideation. Plan: Doing well. Wean Lexapro  over a couple of weeks to see if it is causing bruxism.  10/21/22 appt noted: Bruxism is better not 100% gone.  Not unbearable like it was.  When yawn it was still there.  Totally off Lexapro  for mos. A little more irritable of the Lexapro .  Not sure she wants to go back on it.  No sig anxiety except dentist and takes Xanax .    Bad gag reflex with fear choking.   No sig anxiety overall.  Stress M dep with hx psych hosp.  Had polio as a child.  Goes to church with her.  M issues with sleep and on Ambien.  58 yo.  Still on clonazepam  0.25 mg AM and 0.5 mg HS.  04/20/23 urgent appt :  Been off Lexapro  for a year and been ok.   Med: clonazepam  0.5 mg HS.  Only psych med. Recently more down, anxious, tearful. No px starting Zepbound  about 10 weeks ago without much wt loss so far.  Maybe more dehydration lately. Has felt so bad went to urgent care yesterday.  No fluids.   Couldn't work today.   No new stressors and no reason for this. Bruxism not much different off the Lexapro .  Not better nor worse.  12/01/23 appt noted:  Med: Lexapro  10 mg 1/2 AM and 1 tablet in the AM, clonazepam  0.5 mg 1/2 AM and 1 tablet nightly. 3000% better on Lexapro .  Relapsed off it.  I feel like my normal self. Bruxism continued off Lexapro .  Not as bad but still bothersome daily.  No new concerns.   Restarted zepbound  in June.   Enjoys working out 5 days per week and feels better.   Going to [facility] and needs alprazolam  to travel.  Past Psychiatric Medication Trials:  Wellbutrin , Lexapro  10 clonazepam ,   Adderall,  Flexeril , meloxicam  for bruxism  Episode depression early life after D born and then again later.  Review of Systems:  Review of Systems  HENT:         Jaw tightness and pain better but not gone  Respiratory:  Negative for chest tightness.    Cardiovascular:  Negative for chest pain and palpitations.  Neurological:  Negative for tremors.  Psychiatric/Behavioral:  Negative for agitation, behavioral problems, confusion, decreased concentration, dysphoric mood, self-injury, sleep disturbance and suicidal ideas. The patient is not nervous/anxious and is not hyperactive.     Medications: I have reviewed the patient's current medications.  Current Outpatient Medications  Medication Sig Dispense Refill   atorvastatin  (LIPITOR) 20 MG tablet Take 1 tablet (20 mg total) by mouth daily. 90 tablet 1   clonazePAM  (KLONOPIN ) 0.5 MG tablet Take 0.5 tablets (0.25 mg total) by mouth in the morning AND 1 tablet (0.5 mg total) at bedtime. 135 tablet 1   escitalopram  (LEXAPRO ) 10 MG tablet Take 1 tablet (10 mg total) by mouth daily. 90 tablet 3   labetalol  (NORMODYNE )  100 MG tablet Take 1 tablet (100 mg total) by mouth 2 (two) times daily. 180 tablet 1   metFORMIN  (GLUCOPHAGE -XR) 500 MG 24 hr tablet Take 2 tablets (1,000 mg total) by mouth daily. 180 tablet 1   omeprazole  (PRILOSEC) 20 MG capsule Take 1 capsule (20 mg total) by mouth daily. 90 capsule 1   spironolactone  (ALDACTONE ) 25 MG tablet Take 1 tablet (25 mg total) by mouth 2 (two) times daily. 180 tablet 1   tirzepatide  5 MG/0.5ML injection vial Inject 5 mg into the skin once a week. 2 mL 1   Vitamin D , Ergocalciferol , (DRISDOL ) 1.25 MG (50000 UNIT) CAPS capsule Take 1 capsule (50,000 Units total) by mouth once a week. 12 capsule 2   No current facility-administered medications for this visit.    Medication Side Effects: None  Allergies:  Allergies  Allergen Reactions   Hydrochlorothiazide     REACTION: hypokalemia   Benazepril Nausea And Vomiting    abd cramps    Past Medical History:  Diagnosis Date   Anxiety    Cellulitis    diagnosed Sept.7 2021, left upper  posterior arm,  right upper posterior arm   Depression    Dry mouth    Fibroids    Gallbladder disease    High  cholesterol    HTN (hypertension) 2007   seen in ER  with headaches   Hypertension    Hypokalemia 2007   due to HCTZ   Nervousness    Obesity    Pre-diabetes    Stress    Swelling of lower extremity    Trouble in sleeping     Family History  Problem Relation Age of Onset   Hyperlipidemia Mother    Hypertension Mother    Depression Mother    Anxiety disorder Mother    Obesity Mother    Lung cancer Mother    Prostate cancer Father    Cancer Father    Cancer Maternal Grandmother        thyroid    Hypertension Maternal Grandmother    Hypertension Paternal Grandmother    Breast cancer Maternal Aunt 37   Kidney disease Maternal Aunt         X 2; 1 on Dialysis   Cancer Maternal Aunt        ? primary; also had HTN   Hypertension Maternal Aunt        X 2   Bipolar disorder Paternal Aunt    Prostate cancer Paternal Uncle          3 uncles   Stroke Neg Hx    Diabetes Neg Hx    Heart disease Neg Hx    Colon cancer Neg Hx    Uterine cancer Neg Hx    Bladder Cancer Neg Hx     Social History   Socioeconomic History   Marital status: Single    Spouse name: Not on file   Number of children: 1   Years of education: Not on file   Highest education level: Bachelor's degree (e.g., BA, AB, BS)  Occupational History   Occupation: Agricultural consultant  Tobacco Use   Smoking status: Never   Smokeless tobacco: Never  Vaping Use   Vaping status: Never Used  Substance and Sexual Activity   Alcohol use: No   Drug use: No   Sexual activity: Not Currently    Birth control/protection: Surgical, Abstinence    Comment: 1st intercourse- 15, partners- more than 5, hysterectomy  Other Topics Concern  Not on file  Social History Narrative   Exercise ---  5 days , gym   Social Drivers of Health   Financial Resource Strain: Low Risk  (08/20/2023)   Overall Financial Resource Strain (CARDIA)    Difficulty of Paying Living Expenses: Not hard at all  Food Insecurity: No Food Insecurity  (08/20/2023)   Hunger Vital Sign    Worried About Running Out of Food in the Last Year: Never true    Ran Out of Food in the Last Year: Never true  Transportation Needs: No Transportation Needs (08/20/2023)   PRAPARE - Administrator, Civil Service (Medical): No    Lack of Transportation (Non-Medical): No  Physical Activity: Sufficiently Active (08/20/2023)   Exercise Vital Sign    Days of Exercise per Week: 3 days    Minutes of Exercise per Session: 60 min  Stress: No Stress Concern Present (08/20/2023)   [employer] of Occupational Health - Occupational Stress Questionnaire    Feeling of Stress: Not at all  Social Connections: Moderately Isolated (08/20/2023)   Social Connection and Isolation Panel    Frequency of Communication with Friends and Family: More than three times a week    Frequency of Social Gatherings with Friends and Family: More than three times a week    Attends Religious Services: More than 4 times per year    Active Member of [employer] or Organizations: No    Attends Engineer, structural: Not on file    Marital Status: Never married  Intimate Partner Violence: Not on file    Past Medical History, Surgical history, Social history, and Family history were reviewed and updated as appropriate.   Please see review of systems for further details on the patient's review from today.   Objective:   Physical Exam:  LMP 12/01/2018 (Approximate)   Physical Exam Constitutional:      General: She is not in acute distress.    Appearance: She is well-developed.  Musculoskeletal:        General: No deformity.  Neurological:     Mental Status: She is alert and oriented to person, place, and time.     Motor: No tremor.     Coordination: Coordination normal.     Gait: Gait normal.  Psychiatric:        Attention and Perception: She is attentive. She does not perceive auditory or visual hallucinations.        Mood and Affect: Mood is not anxious or  depressed. Affect is not labile or tearful.        Speech: Speech normal.        Behavior: Behavior normal.        Thought Content: Thought content normal. Thought content is not delusional. Thought content does not include homicidal or suicidal ideation. Thought content does not include suicidal plan.        Cognition and Memory: Cognition normal.        Judgment: Judgment normal.     Comments: Insight intact. No auditory or visual hallucinations. No delusions.       Lab Review:     Component Value Date/Time   NA 138 08/24/2023 0914   NA 143 12/08/2018 0820   K 5.0 08/24/2023 0914   CL 106 08/24/2023 0914   CO2 20 08/24/2023 0914   GLUCOSE 82 08/24/2023 0914   BUN 22 08/24/2023 0914   BUN 12 12/08/2018 0820   CREATININE 1.42 (H) 08/24/2023 0914   CALCIUM  9.9  08/24/2023 0914   PROT 6.8 08/24/2023 0914   PROT 6.4 12/08/2018 0820   ALBUMIN 4.3 04/21/2023 1728   ALBUMIN 4.0 12/08/2018 0820   AST 16 08/24/2023 0914   ALT 12 08/24/2023 0914   ALKPHOS 102 04/21/2023 1728   BILITOT 0.6 08/24/2023 0914   BILITOT 0.4 12/08/2018 0820   GFRNONAA 53 (L) 11/18/2019 1446   GFRAA >60 11/18/2019 1446       Component Value Date/Time   WBC 7.1 08/24/2023 0914   RBC 4.56 08/24/2023 0914   HGB 12.5 08/24/2023 0914   HGB 12.8 05/05/2018 1252   HCT 40.1 08/24/2023 0914   HCT 40.4 05/05/2018 1252   PLT 267 08/24/2023 0914   MCV 87.9 08/24/2023 0914   MCV 86 05/05/2018 1252   MCH 27.4 08/24/2023 0914   MCHC 31.2 (L) 08/24/2023 0914   RDW 12.8 08/24/2023 0914   RDW 13.6 05/05/2018 1252   LYMPHSABS 2.2 04/21/2023 1728   LYMPHSABS 2.1 05/05/2018 1252   MONOABS 0.4 04/21/2023 1728   EOSABS 128 08/24/2023 0914   EOSABS 0.1 05/05/2018 1252   BASOSABS 43 08/24/2023 0914   BASOSABS 0.0 05/05/2018 1252    No results found for: POCLITH, LITHIUM   No results found for: PHENYTOIN, PHENOBARB, VALPROATE, CBMZ   Corrected low vitamin D  2020  .res Assessment: Plan:    Cheryl Owens was seen today for follow-up.  Diagnoses and all orders for this visit:  Panic disorder with agoraphobia  Depression, major, recurrent, in complete remission  Generalized anxiety disorder  Insomnia due to mental condition  Bruxism    face to face time with patient was spent on counseling and coordination of care. .  Could also help with her weight loss which is a goal.  Has gotten much better at setting boundaries with work and not overworking.  Mood is therefore better. Discussed meds and SE.   We discussed the short-term risks associated with benzodiazepines including sedation and increased fall risk among others.  Discussed long-term side effect risk including dependence, potential withdrawal symptoms, and the potential eventual dose-related risk of dementia.  But recent studies from 2020 dispute this association between benzodiazepines and dementia risk. Newer studies in 2020 do not support an association with dementia.   But she might go back on for irritability if needed.  Continue clonazepam  0.25 mg AM and 0.5 mg PM Continue Lexapro  10 mg AM and 5 mg PM  Xanax  for flyinng and dental visits.   FU 12 mos.  Lorene Macintosh, MD, DFAPA   Please see After Visit Summary for patient specific instructions.  No future appointments.    No orders of the defined types were placed in this encounter.     -------------------------------

## 2023-12-10 ENCOUNTER — Other Ambulatory Visit: Payer: Self-pay | Admitting: Family Medicine

## 2023-12-11 ENCOUNTER — Other Ambulatory Visit: Payer: Self-pay

## 2023-12-11 ENCOUNTER — Other Ambulatory Visit (HOSPITAL_COMMUNITY): Payer: Self-pay

## 2023-12-11 MED ORDER — METFORMIN HCL ER 500 MG PO TB24
1000.0000 mg | ORAL_TABLET | Freq: Every day | ORAL | 1 refills | Status: AC
  Filled 2023-12-11: qty 180, 90d supply, fill #0
  Filled 2024-04-14: qty 180, 90d supply, fill #1

## 2023-12-22 ENCOUNTER — Other Ambulatory Visit (HOSPITAL_COMMUNITY): Payer: Self-pay

## 2023-12-22 ENCOUNTER — Other Ambulatory Visit: Payer: Self-pay

## 2023-12-23 ENCOUNTER — Other Ambulatory Visit: Payer: Self-pay

## 2023-12-23 ENCOUNTER — Encounter: Payer: Self-pay | Admitting: Pharmacist

## 2023-12-26 ENCOUNTER — Other Ambulatory Visit (HOSPITAL_COMMUNITY): Payer: Self-pay

## 2023-12-28 ENCOUNTER — Other Ambulatory Visit: Payer: Self-pay

## 2024-01-04 ENCOUNTER — Other Ambulatory Visit: Payer: Self-pay | Admitting: Medical Genetics

## 2024-01-04 ENCOUNTER — Other Ambulatory Visit: Payer: Self-pay | Admitting: Family Medicine

## 2024-01-04 DIAGNOSIS — Z006 Encounter for examination for normal comparison and control in clinical research program: Secondary | ICD-10-CM

## 2024-01-04 DIAGNOSIS — R7303 Prediabetes: Secondary | ICD-10-CM

## 2024-01-25 ENCOUNTER — Other Ambulatory Visit (HOSPITAL_COMMUNITY): Payer: Self-pay

## 2024-01-25 ENCOUNTER — Encounter: Payer: Self-pay | Admitting: Family Medicine

## 2024-01-25 DIAGNOSIS — N1831 Chronic kidney disease, stage 3a: Secondary | ICD-10-CM

## 2024-01-25 DIAGNOSIS — R7303 Prediabetes: Secondary | ICD-10-CM

## 2024-01-25 MED ORDER — ZEPBOUND 7.5 MG/0.5ML ~~LOC~~ SOAJ
7.5000 mg | SUBCUTANEOUS | 2 refills | Status: DC
  Filled 2024-01-25: qty 2, 28d supply, fill #0

## 2024-01-27 ENCOUNTER — Other Ambulatory Visit: Payer: Self-pay | Admitting: Family Medicine

## 2024-01-27 DIAGNOSIS — Z1231 Encounter for screening mammogram for malignant neoplasm of breast: Secondary | ICD-10-CM

## 2024-02-03 MED ORDER — TIRZEPATIDE-WEIGHT MANAGEMENT 7.5 MG/0.5ML ~~LOC~~ SOLN: 7.5000 mg | SUBCUTANEOUS | 1 refills | Status: DC

## 2024-02-03 NOTE — Addendum Note (Signed)
 Addended by: Richel Millspaugh D on: 02/03/2024 04:44 PM   Modules accepted: Orders

## 2024-03-08 ENCOUNTER — Other Ambulatory Visit: Payer: Self-pay | Admitting: Family Medicine

## 2024-03-08 ENCOUNTER — Other Ambulatory Visit: Payer: Self-pay

## 2024-03-08 ENCOUNTER — Other Ambulatory Visit (HOSPITAL_COMMUNITY): Payer: Self-pay

## 2024-03-08 ENCOUNTER — Encounter: Payer: Self-pay | Admitting: Family Medicine

## 2024-03-08 DIAGNOSIS — I10 Essential (primary) hypertension: Secondary | ICD-10-CM

## 2024-03-08 DIAGNOSIS — E785 Hyperlipidemia, unspecified: Secondary | ICD-10-CM

## 2024-03-08 MED ORDER — ATORVASTATIN CALCIUM 20 MG PO TABS
20.0000 mg | ORAL_TABLET | Freq: Every day | ORAL | 0 refills | Status: AC
  Filled 2024-03-08 (×2): qty 90, 90d supply, fill #0

## 2024-03-08 MED ORDER — SPIRONOLACTONE 25 MG PO TABS
25.0000 mg | ORAL_TABLET | Freq: Two times a day (BID) | ORAL | 0 refills | Status: AC
  Filled 2024-03-08: qty 180, 90d supply, fill #0

## 2024-03-09 ENCOUNTER — Other Ambulatory Visit: Payer: Self-pay | Admitting: Family

## 2024-03-09 ENCOUNTER — Telehealth (HOSPITAL_COMMUNITY): Payer: Self-pay

## 2024-03-09 ENCOUNTER — Other Ambulatory Visit (HOSPITAL_COMMUNITY): Payer: Self-pay

## 2024-03-09 MED ORDER — TIRZEPATIDE 5 MG/0.5ML ~~LOC~~ SOAJ
5.0000 mg | SUBCUTANEOUS | 1 refills | Status: DC
  Filled 2024-03-09: qty 2, 28d supply, fill #0

## 2024-03-09 MED ORDER — ONDANSETRON HCL 4 MG PO TABS
4.0000 mg | ORAL_TABLET | Freq: Three times a day (TID) | ORAL | 0 refills | Status: AC | PRN
  Filled 2024-03-09: qty 20, 7d supply, fill #0

## 2024-03-09 NOTE — Telephone Encounter (Signed)
 Ozempic/Mounjaro is approved exclusively as an adjunct to diet and exercise to improve glycemic  control in adults with type 2 diabetes mellitus. A review of patient's medical chart reveals no  documented diagnosis of type 2 diabetes or an A1C indicative of diabetes. Therefore, they do not  currently meet the criteria for prior authorization of this medication. If clinically appropriate, alternative  options such as Saxenda, Zepbound, or Georjean may be considered for this patient.   To help increase the likelihood of a successful prior authorization for this GLP1, please add a diagnosis of Type II diabetes to the patient's problem list, if clinically appropriate, and associate the diagnosis with a recent visit note. Thank you for your partnership.

## 2024-03-14 ENCOUNTER — Ambulatory Visit
Admission: RE | Admit: 2024-03-14 | Discharge: 2024-03-14 | Disposition: A | Discharge status: Home / Self Care | Source: Ambulatory Visit | Attending: Family Medicine | Admitting: Family Medicine

## 2024-03-14 DIAGNOSIS — Z1231 Encounter for screening mammogram for malignant neoplasm of breast: Secondary | ICD-10-CM

## 2024-03-14 MED ORDER — TIRZEPATIDE-WEIGHT MANAGEMENT 5 MG/0.5ML ~~LOC~~ SOLN: 5.0000 mg | SUBCUTANEOUS | 1 refills | Status: AC

## 2024-03-14 NOTE — Telephone Encounter (Signed)
 Rx fixed and 5mg  vials sent in.

## 2024-03-14 NOTE — Addendum Note (Signed)
 Addended by: ESTELLE GILLIS D on: 03/14/2024 03:46 PM   Modules accepted: Orders

## 2024-03-21 ENCOUNTER — Encounter: Payer: Self-pay | Admitting: *Deleted

## 2024-03-22 ENCOUNTER — Other Ambulatory Visit (HOSPITAL_COMMUNITY): Payer: Self-pay

## 2024-03-23 ENCOUNTER — Other Ambulatory Visit (HOSPITAL_COMMUNITY): Payer: Self-pay

## 2024-04-13 ENCOUNTER — Encounter: Payer: Self-pay | Admitting: Family Medicine

## 2024-04-14 ENCOUNTER — Other Ambulatory Visit (HOSPITAL_COMMUNITY): Payer: Self-pay

## 2024-05-05 ENCOUNTER — Ambulatory Visit: Admitting: Family Medicine

## 2024-08-30 ENCOUNTER — Ambulatory Visit: Admitting: Psychiatry
